# Patient Record
Sex: Male | Born: 1965 | Race: White | Hispanic: No | Smoking: Never smoker
Health system: Southern US, Community
[De-identification: ages and names within clinical notes are randomized; demographics above are authoritative.]

## PROBLEM LIST (undated history)

## (undated) ENCOUNTER — Emergency Department (HOSPITAL_COMMUNITY): Admission: EM | Payer: No Typology Code available for payment source | Source: Home / Self Care

## (undated) DIAGNOSIS — E785 Hyperlipidemia, unspecified: Secondary | ICD-10-CM

## (undated) DIAGNOSIS — N200 Calculus of kidney: Secondary | ICD-10-CM

## (undated) DIAGNOSIS — G20A1 Parkinson's disease without dyskinesia, without mention of fluctuations: Secondary | ICD-10-CM

## (undated) DIAGNOSIS — I2510 Atherosclerotic heart disease of native coronary artery without angina pectoris: Secondary | ICD-10-CM

## (undated) DIAGNOSIS — G4700 Insomnia, unspecified: Secondary | ICD-10-CM

## (undated) HISTORY — PX: TONSILLECTOMY: SUR1361

## (undated) HISTORY — DX: Parkinson's disease without dyskinesia, without mention of fluctuations: G20.A1

## (undated) HISTORY — DX: Calculus of kidney: N20.0

## (undated) HISTORY — DX: Insomnia, unspecified: G47.00

## (undated) HISTORY — DX: Hyperlipidemia, unspecified: E78.5

## (undated) HISTORY — DX: Atherosclerotic heart disease of native coronary artery without angina pectoris: I25.10

## (undated) HISTORY — PX: RHINOPLASTY: SUR1284

---

## 1998-04-21 ENCOUNTER — Ambulatory Visit (HOSPITAL_COMMUNITY): Admission: RE | Admit: 1998-04-21 | Discharge: 1998-04-21 | Payer: Self-pay | Admitting: *Deleted

## 2006-06-25 ENCOUNTER — Ambulatory Visit: Payer: Self-pay | Admitting: Internal Medicine

## 2006-06-26 ENCOUNTER — Ambulatory Visit (HOSPITAL_COMMUNITY): Admission: RE | Admit: 2006-06-26 | Discharge: 2006-06-26 | Payer: Self-pay | Admitting: Internal Medicine

## 2006-06-26 IMAGING — US US ABDOMEN COMPLETE
1 series · 13 of 25 positions shown · non-contrast
Comparison: None.

CLINICAL DATA: 40-year-old, elevated liver function studies. 
 ABDOMEN ULTRASOUND:
TECHNIQUE: Complete abdominal ultrasound examination was performed including evaluation of the liver, gallbladder, bile ducts, pancreas, kidneys, spleen, IVC, and abdominal aorta.

[Series 1: unknown · 0.34mm/px · 13 of 84 slices shown]
[im 1/84]
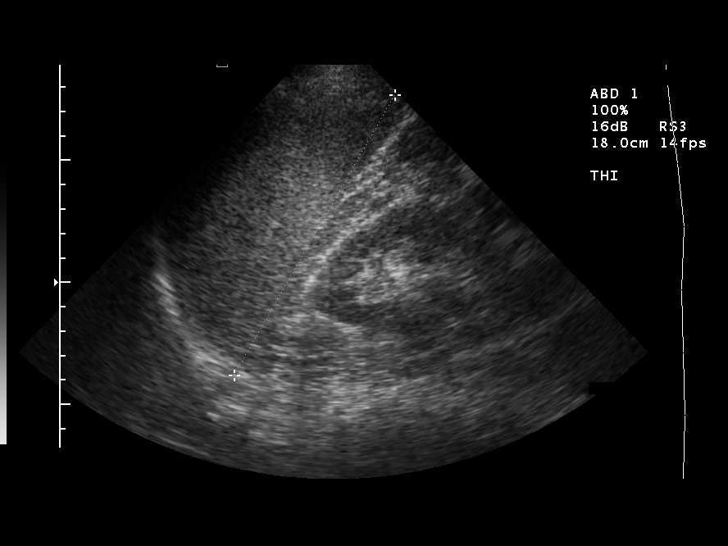
[im 7/84]
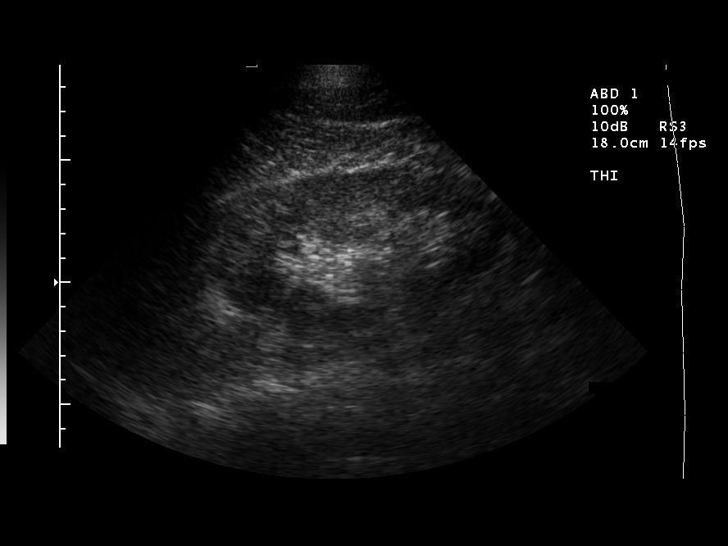
[im 14/84]
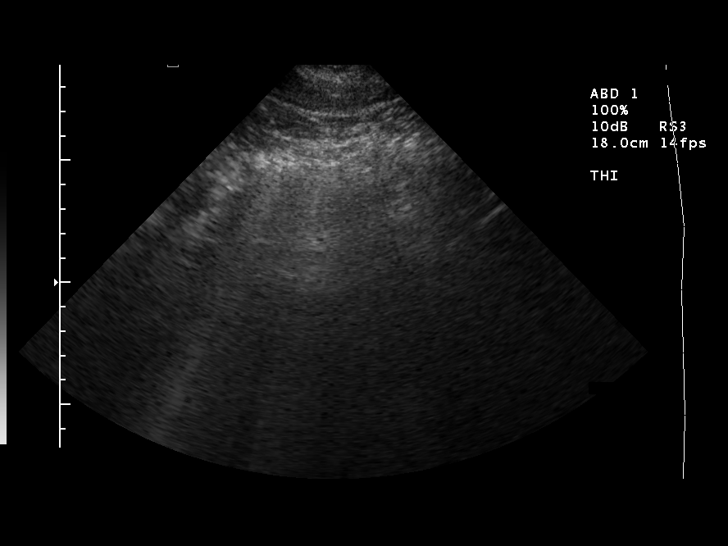
[im 21/84]
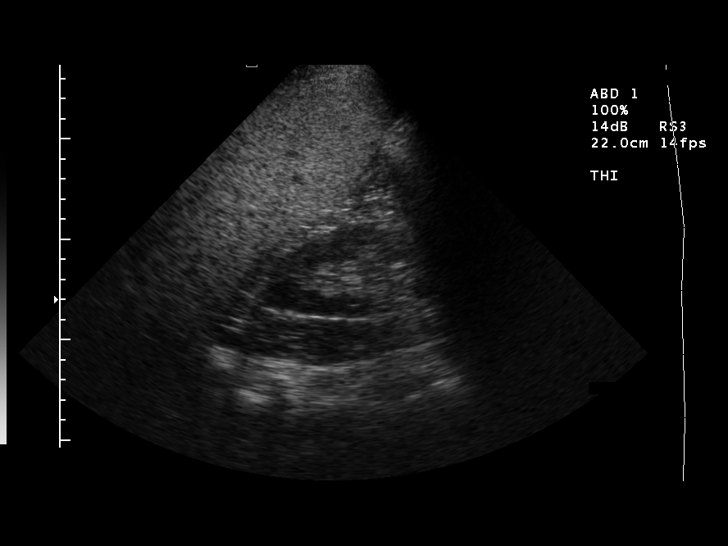
[im 28/84]
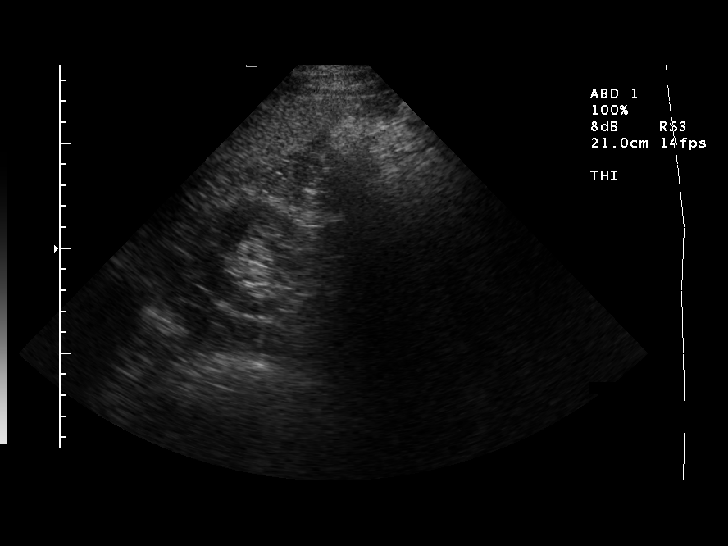
[im 35/84]
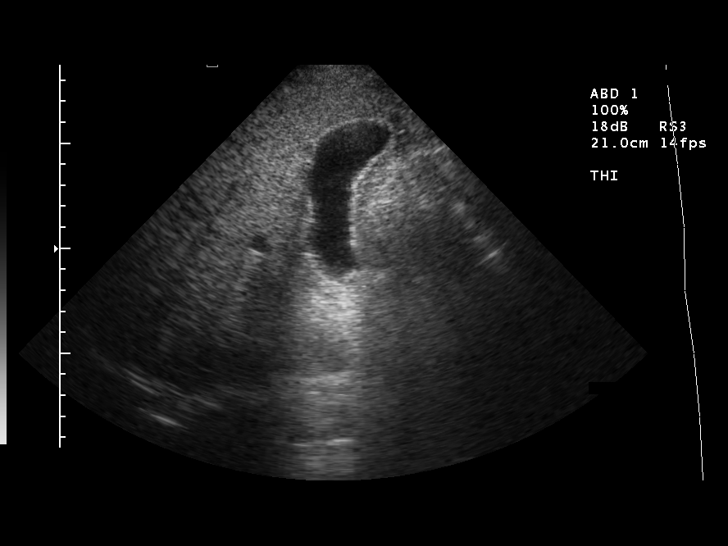
[im 42/84]
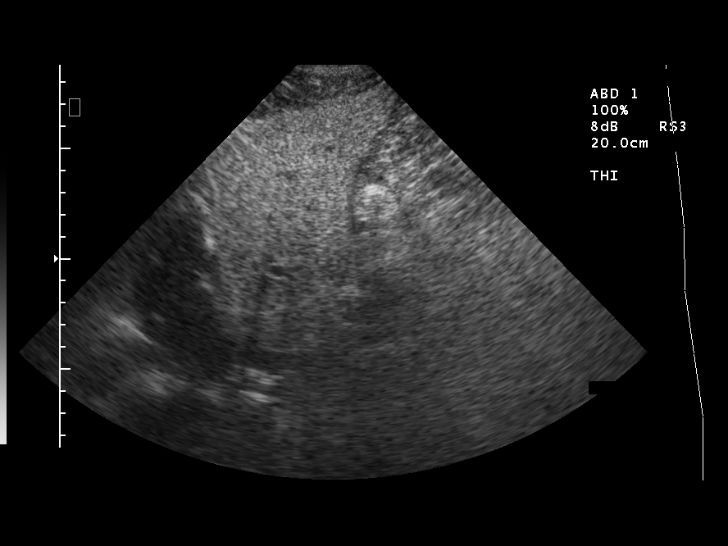
[im 49/84]
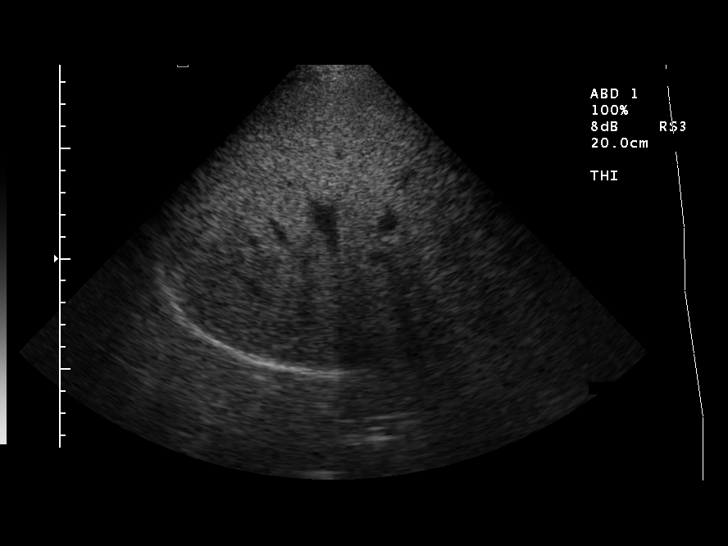
[im 56/84]
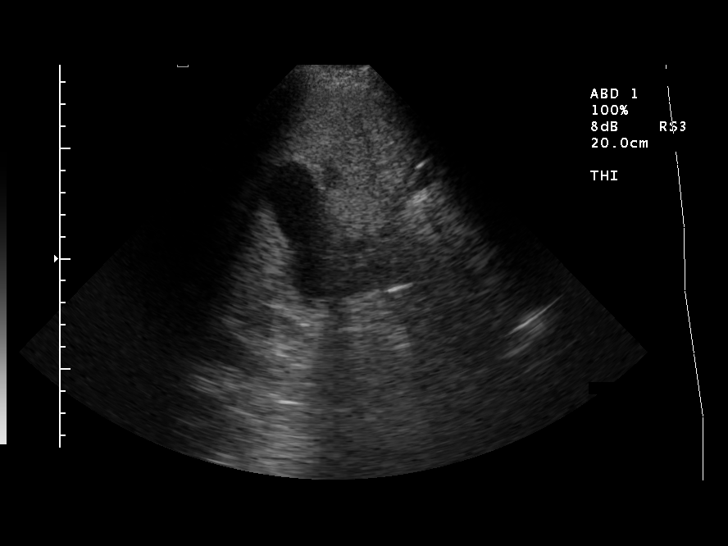
[im 63/84]
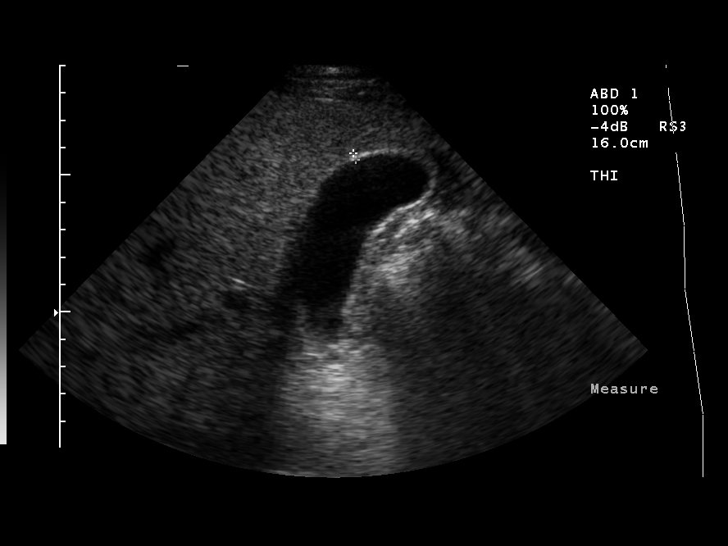
[im 70/84]
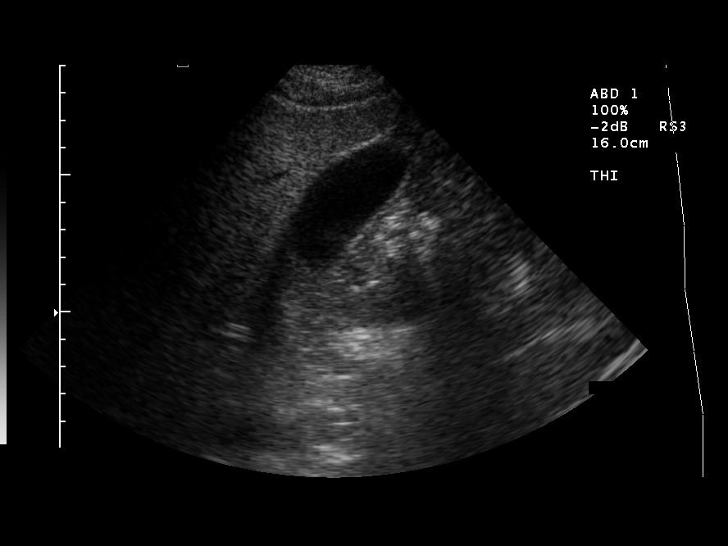
[im 77/84]
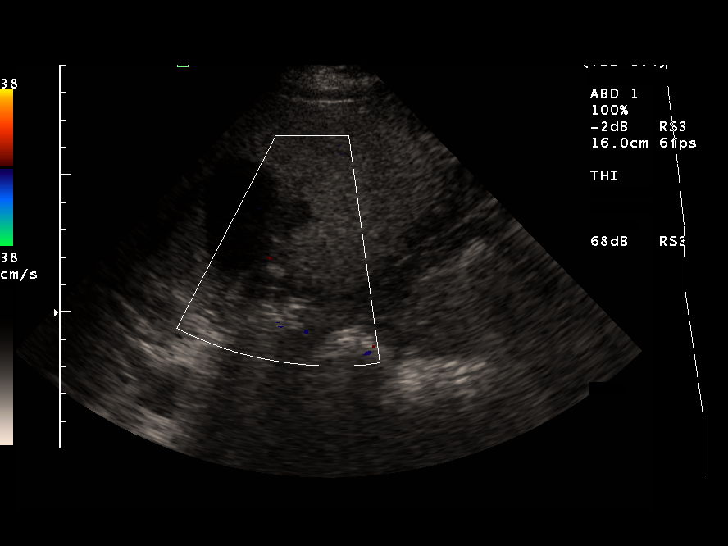
[im 84/84]
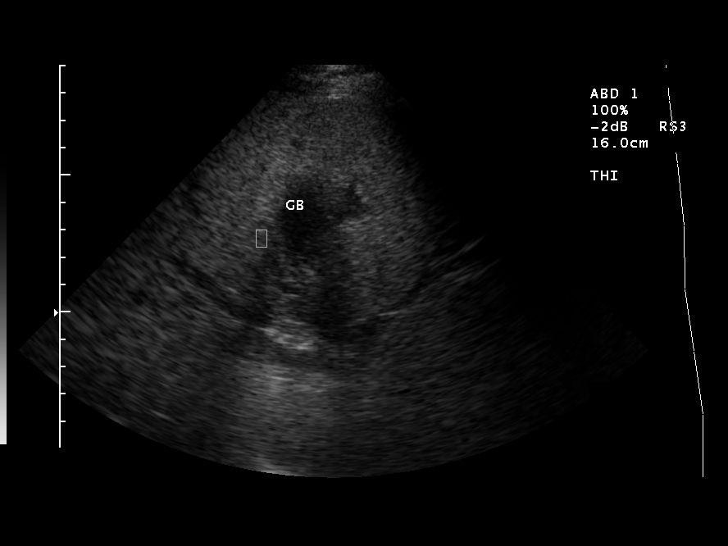

[13 of 25 positions shown; findings below may reference images not displayed]

FINDINGS: The liver demonstrates marked increased echogenicity with poor through transmission consistent with diffuse fatty infiltration.  There is an area of focal fatty sparing near the gallbladder fossa which is not uncommon.  No intrahepatic biliary dilatation.  The common bile duct measures 2.6 mm.  Gallbladder is sonographically normal.  
 IVC and aorta are normal in caliber.  Pancreas is not well visualized due to overlying bowel gas and poor sonographic window of the liver.  Spleen is within normal limits measuring a maximum of 13.2 cm.   Right kidney measures 12.9 cm.  Left kidney measures 12.1 cm.  Both kidneys demonstrate normal echogenicity without focal lesions or hydronephrosis.
IMPRESSION: 1.  Diffuse fatty infiltration of the liver.  Focal fatty sparing near the gallbladder fossa. 
 2.  Nonvisualization of the pancreas. 
 3.  Remainder of the examination is unremarkable.

## 2006-06-30 ENCOUNTER — Ambulatory Visit: Payer: Self-pay | Admitting: Internal Medicine

## 2006-06-30 ENCOUNTER — Ambulatory Visit (HOSPITAL_COMMUNITY): Admission: RE | Admit: 2006-06-30 | Discharge: 2006-06-30 | Payer: Self-pay | Admitting: Internal Medicine

## 2010-04-23 ENCOUNTER — Ambulatory Visit: Payer: Self-pay | Admitting: Cardiovascular Disease

## 2010-04-23 DIAGNOSIS — R079 Chest pain, unspecified: Secondary | ICD-10-CM | POA: Insufficient documentation

## 2010-04-23 DIAGNOSIS — R011 Cardiac murmur, unspecified: Secondary | ICD-10-CM | POA: Insufficient documentation

## 2010-04-23 DIAGNOSIS — R9431 Abnormal electrocardiogram [ECG] [EKG]: Secondary | ICD-10-CM | POA: Insufficient documentation

## 2010-04-24 ENCOUNTER — Encounter: Payer: Self-pay | Admitting: Cardiovascular Disease

## 2010-05-09 ENCOUNTER — Telehealth (INDEPENDENT_AMBULATORY_CARE_PROVIDER_SITE_OTHER): Payer: Self-pay | Admitting: *Deleted

## 2010-05-10 ENCOUNTER — Ambulatory Visit (HOSPITAL_COMMUNITY): Admission: RE | Admit: 2010-05-10 | Discharge: 2010-05-10 | Payer: Self-pay | Admitting: Cardiovascular Disease

## 2010-05-10 ENCOUNTER — Ambulatory Visit: Payer: Self-pay | Admitting: Internal Medicine

## 2010-05-10 ENCOUNTER — Encounter (HOSPITAL_COMMUNITY): Admission: RE | Admit: 2010-05-10 | Discharge: 2010-07-11 | Payer: Self-pay | Admitting: Cardiovascular Disease

## 2010-05-10 ENCOUNTER — Encounter: Payer: Self-pay | Admitting: Internal Medicine

## 2010-05-10 ENCOUNTER — Ambulatory Visit: Payer: Self-pay

## 2010-05-10 ENCOUNTER — Encounter: Payer: Self-pay | Admitting: Cardiovascular Disease

## 2010-05-11 ENCOUNTER — Telehealth (INDEPENDENT_AMBULATORY_CARE_PROVIDER_SITE_OTHER): Payer: Self-pay | Admitting: *Deleted

## 2010-12-04 NOTE — Letter (Signed)
Summary: EKG 04-05-10  EKG 04-05-10   Imported By: Faythe Ghee 04/24/2010 15:12:34  _____________________________________________________________________  External Attachment:    Type:   Image     Comment:   External Document

## 2010-12-04 NOTE — Assessment & Plan Note (Signed)
Summary: NP6 CHEST PAIN AND ABNORMAL EKG   Visit Type:  Initial Consult Primary Provider:  [facility]  CC:  no cardiology complaints.  History of Present Illness: Edward Fisher is seen today at the request of the [facility] for abnormal ECG.  CRF's include low HDL which he has recently been started on fish oil.  I reviewed his ECG from 04/05/10 which showed poor R wave progression and Q's in 3,F  He has had two episodes of SSCP one in April and one in May.  One occurred after riding go carts and one after attaching a trailer.  Lasted about 30 minutes with central pressure.  No associated diaphoresis, dyspnea, syncope or edema.  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  Does these activities with no limitations and no SSCP.  I take care of Damek's dad Reita Cliche who has been Rx for LM disease.  Current Problems (verified): 1)  Abnormal Electrocardiogram  (ICD-794.31) 2)  Chest Pain  (ICD-786.50)  Current Medications (verified): 1)  Tylenol 325 Mg Tabs (Acetaminophen) .... Take As Needed For Pain 2)  Motrin Ib 200 Mg Tabs (Ibuprofen) .... Use As Needed 3)  Fish Oil 500 Mg Caps (Omega-3 Fatty Acids) .... Take 2 Caps Daily 4)  Aspir-Low 81 Mg Tbec (Aspirin) .... Take 1 Tab Daily  Allergies (verified): No Known Drug Allergies  Past History:  Past Medical History: Last updated: 04/18/2010 hypercholesterolemia insomnia renal lithiasis  Past Surgical History: Last updated: 04/18/2010 tonsillectomy rhinoplasty  Family History: Last updated: 04/18/2010 Father:alive peptic ulcer disease Mother:alive hypertension Siblings:3 alive and well  Social History: Last updated: 04/18/2010 Full Time Married  Tobacco Use - No.  Alcohol Use - no Regular Exercise - no Drug Use - no  Review of Systems       Denies fever, malais, weight loss, blurry vision, decreased visual acuity, cough, sputum, SOB, hemoptysis, pleuritic pain,  palpitaitons, heartburn, abdominal pain, melena, lower extremity edema, claudication, or rash.   Vital Signs:  Patient profile:   45 year old male Height:      71 inches Weight:      203 pounds BMI:     28.42 Pulse rate:   91 / minute BP sitting:   125 / 74  (right arm)  Vitals Entered By: Dreama Saa, CNA (April 23, 2010 3:47 PM)  Physical Exam  General:  Affect appropriate Healthy:  appears stated age HEENT: normal Neck supple with no adenopathy JVP normal no bruits no thyromegaly Lungs clear with no wheezing and good diaphragmatic motion Heart:  S1/S2 soft systolic  murmur, no rub, gallop or click PMI normal Abdomen: benighn, BS positve, no tenderness, no AAA no bruit.  No HSM or HJR Distal pulses intact with no bruits No edema Neuro non-focal Skin warm and dry    Impression & Recommendations:  Problem # 1:  ABNORMAL ELECTROCARDIOGRAM (ICD-794.31) With SSCP x2  F/U stress myovue   His updated medication list for this problem includes:    Aspir-low 81 Mg Tbec (Aspirin) .[name]... Take 1 tab daily  Problem # 2:  CHEST PAIN (ICD-786.50) See above.  ? old IMI ? occurred in April or May  Echo and MYOUVUE R/O RWMA and ischemia His updated medication list for this problem includes:    Aspir-low 81 Mg Tbec (Aspirin) .[name]... Take 1 tab daily  Problem # 3:  CARDIAC MURMUR (ICD-785.2) Benign systolic murmur.  Echo.    Other Orders:  2-D Echocardiogram (2D Echo) Nuclear Stress Test (Nuc Stress Test)  Patient Instructions: 1)  Your physician recommends that you schedule a follow-up appointment in: as needed  2)  Your physician recommends that you continue on your current medications as directed. Please refer to the Current Medication list given to you today. 3)  Your physician has requested that you have an echocardiogram.  Echocardiography is a painless test that uses sound waves to create images of your heart. It provides your doctor with information about the size and shape  of your heart and how well your heart's chambers and valves are working.  This procedure takes approximately one hour. There are no restrictions for this procedure. 4)  Your physician has requested that you have an exercise stress myoview.  For further information please visit [email or web address]  Please follow instruction sheet, as given.   EKG Report  Procedure date:  04/23/2010  Findings:      NSR 95 Possible LAE RSR' ? IMI Compared to ECG Department Health 6/4 Improved R wave progression increased HR Axis shift with no Q in lead F

## 2010-12-04 NOTE — Assessment & Plan Note (Signed)
Summary: Cardiology Nuclear Study  Nuclear Med Background Indications for Stress Test: Evaluation for Ischemia, Abnormal EKG  Indications Comments: .   History Comments: NO DOCUMENTED CAD.  Symptoms: Chest Pain, Chest Pain with Exertion, Chest Pressure, Chest Pressure with Exertion, DOE  Symptoms Comments: Last episode of CP:03/29/10   Nuclear Pre-Procedure Cardiac Risk Factors: Lipids Caffeine/Decaff Intake: None NPO After: 10:00 PM Lungs: Clear IV 0.9% NS with Angio Cath: 20g     IV Site: (R) AC IV Started by: Irean Hong RN Chest Size (in) 44     Height (in): 71 Weight (lb): 198 BMI: 27.72  Nuclear Med Study 1 or 2 day study:  1 day     Stress Test Type:  Stress Reading MD:  Dietrich Pates, MD     Referring MD:  Charlton Haws, MD Resting Radionuclide:  Technetium 9m Tetrofosmin     Resting Radionuclide Dose:  11.0 mCi  Stress Radionuclide:  Technetium 52m Tetrofosmin     Stress Radionuclide Dose:  33.0 mCi   Stress Protocol Exercise Time (min):  8:16 min     Max HR:  173 bpm     Predicted Max HR:  174 bpm  Max Systolic BP: 187 mm Hg     Percent Max HR:  99.43 %     METS: 10.4 Rate Pressure Product:  81191    Stress Test Technologist:  [employer]a [employer] CMA-N     Nuclear Technologist:  Domenic Polite CNMT  Rest Procedure  Myocardial perfusion imaging was performed at rest 45 minutes following the intravenous administration of Myoview Technetium 81m Tetrofosmin.  Stress Procedure  The patient exercised for 8:16.  The patient stopped due to fatigue and denied any chest pain.  There were no significant ST-T wave changes.  Myoview was injected at peak exercise and myocardial perfusion imaging was performed after a brief delay.  QPS Raw Data Images:  Soft tissue (diaphragm) underlies heart. Stress Images:  There is normal uptake in all areas. Rest Images:  Normal homogeneous uptake in all areas of the myocardium. Subtraction (SDS):  No evidence of ischemia. Transient Ischemic  Dilatation:  .92  (Normal <1.22)  Lung/Heart Ratio:  .27  (Normal <0.45)  Quantitative Gated Spect Images QGS EDV:  85 ml QGS ESV:  29 ml QGS EF:  66 %   Overall Impression  Exercise Capacity: Good exercise capacity. BP Response: Normal blood pressure response. Clinical Symptoms: No chest pain ECG Impression: No significant ST segment change suggestive of ischemia. Overall Impression: Normal stress nuclear study.  Appended Document: Cardiology Nuclear Study normal nuclear

## 2010-12-04 NOTE — Letter (Signed)
Summary: [place] [facility] (Nuc Med Stress)  [facility]  [address] [place] [address]   Phone: [phone]  Fax: [phone]    Nuclear Medicine 1-Day Stress Test Information Sheet  Re:     Edward Fisher   DOB:     January 07, 1966 MRN:     [id number] Weight:  Appointment Date: Register at: Appointment Time: Referring MD:  _X__Exercise Stress  __Adenosine   __Dobutamine  __Lexiscan  __Persantine   __Thallium  Urgency: ____1 (next day)   ____2 (one week)    ____3 (PRN)  Patient will receive Follow Up call with results: Patient needs follow-up appointment:  Instructions regarding medication:  How to prepare for your stress test: 1. DO NOT eat or dring 6 hours prior to your arrival time. This includes no caffeine (coffee, tea, sodas, chocolate) if you were instructed to take your medications, drink water with it. 2. DO NOT use any tobacco products for at leaset 8 hours prior to arrival. 3. DO NOT wear dresses or any clothing that may have metal clasps or buttons. 4. Wear short sleeve shirts, loose clothing, and comfortalbe walking shoes. 5. DO NOT use lotions, oils or powder on your chest before the test. 6. The test will take approximately 3-4 hours from the time you arrive until completion. 7. To register the day of the test, go to the Short Stay entrance at [facility]. 8. If you must cancel your test, call [phone] as soon as you are aware.  After you arrive for test:   When you arrive at [facility], you will go to Short Stay to be registered. They will then send you to Radiology to check in. The Nuclear Medicine Tech will get you and start an IV in your arm or hand. A small amount of a radioactive tracer will then be injected into your IV. This tracer will then have to circulate for 30-45 minutes. During this time you will wait in the waiting room and you will be able to drink something without caffeine. A series of pictures will be taken of  your heart follwoing this waiting period. After the 1st set of pictures you will go to the stress lab to get ready for your stress test. During the stress test, another small amount of a radioactive tracer will be injected through your IV. When the stress test is complete, there is a short rest period while your heart rate and blood pressure will be monitored. When this monitoring period is complete you will have another set of pictrues taken. (The same as the 1st set of pictures). These pictures are taken between 15 minutes and 1 hour after the stress test. The time depends on the type of stress test you had. Your doctor will inform you of your test results within 7 days after test.    The possibilities of certain changes are possible during the test. They include abnormal blood pressure and disorders of the heart. Side effects of persantine or adenosine can include flushing, chest pain, shortness of breath, stomach tightness, headache and light-headedness. These side effects usually do not last long and are self-resolving. Every effort will be made to keep you comfortable and to minimize complications by obtaining a medical history and by close observation during the test. Emergency equipment, medications, and trained personnel are available to deal with any unusual situation which may arise.  Please notify office at least 48 hours in advance if you are unable to keep  this appt.

## 2010-12-04 NOTE — Progress Notes (Signed)
Summary: Nuclear Pre-Procedure  Phone Note Outgoing Call   Call placed by: Milana Na, EMT-P,  May 09, 2010 3:44 PM Summary of Call: Reviewed information on Myoview Information Sheet (see scanned document for further details).  Spoke with patient's wife.     Nuclear Med Background Indications for Stress Test: Evaluation for Ischemia, Abnormal EKG  Indications Comments: PRWP per P.Nishan    Symptoms: Chest Pain, Chest Pressure    Nuclear Pre-Procedure Cardiac Risk Factors: Lipids Height (in): 71  Nuclear Med Study Referring MD:  P.Sara Lee

## 2010-12-04 NOTE — Progress Notes (Signed)
Summary: TEST RESULTS  Phone Note Call from Patient Call back at [facility] Phone [phone] Call back at [phone] OR [phone]   Caller: PT Reason for Call: Lab or Test Results Summary of Call: PT CALIING TO GET TEST RESULTS. NUC AND ECHO DONE 05/10/10. Initial call taken by: Faythe Ghee,  May 11, 2010 2:13 PM  Follow-up for Phone Call        No answer at any of the 3 phone numbers provided by pt. Will continue to call. Follow-up by: Larita Fife Via LPN,  May 12, 2439 2:37 PM  Additional Follow-up for Phone Call Additional follow up Details #1::        results discussed in detail with pt's spouse on the telephone per his request. She verbalized understanding. Additional Follow-up by: Teressa Lower RN,  May 11, 2010 4:51 PM

## 2010-12-04 NOTE — Letter (Signed)
Summary: LABS 04-06-10  LABS 04-06-10   Imported By: Faythe Ghee 04/24/2010 15:12:13  _____________________________________________________________________  External Attachment:    Type:   Image     Comment:   External Document

## 2011-03-22 NOTE — Consult Note (Signed)
NAMEMARQUAVIS, Fisher                 ACCOUNT NO.:  [id number]   MEDICAL RECORD NO.:  [id number]           PATIENT TYPE:  AMB   LOCATION:                                FACILITY:  [facility]   PHYSICIAN:  R. Roetta Sessions, M.D. DATE OF BIRTH:  11/07/1965   DATE OF CONSULTATION:  DATE OF DISCHARGE:                                   CONSULTATION   REFERRING PHYSICIAN:  Dr.  Phillips Odor.   REASON FOR CONSULTATION:  Intermittent hematochezia and abnormal LFTs.   HISTORY OF PRESENT ILLNESS:  [name] is a 45 year old Caucasian male who  presents with two concerns today, the first being a history of intermittent  hematochezia.  He generally has had problems with this over the last 10-15  years.  He tells me that he has had at least 5 occasions where he has  noticed small to moderate amounts of rectal bleeding with wiping on the  toilet tissue.  He describes it as bright red.  He went to see Dr. Phillips Odor  for physical exam, and he noticed blood on digital rectal exam.  He  complains of occasional straining.  He feels he may have some small  hemorrhoids but has never been treated for this.  He denies any proctalgia  or pruritus.  He denies any abdominal pain.  His bowel movements have been  soft and brown.  Occasionally he has hard stools in small amount __________  , but this is rare.   He also is here for evaluation of elevated AST at 41 and ALT at 77 found on  routine labs. He tells me he felt he had some abnormalities of his LFTs back  around 10 years ago.  He has a tattoo to his right forearm from the 1980s.  He denies any history of transfusion, IV or intranasal drug use.  Denies any  heavy alcohol use.  Denies any history of hepatitis, jaundice. Denies any  problems with darkening of his urine or clay-colored stools, fevers, chills,  or occupational exposure.  He denies history of multiple sexual partners.  He was also recently diagnosed with hypercholesterolemia,  hypertriglyceridemia.   PAST MEDICAL HISTORY:  1. Diagnosed last week with hypercholesterolemia.  2. Insomnia.  3. Renal lithiasis.  4. Tonsillectomy.  5. Status post rhinoplasty.   CURRENT MEDICATIONS:  1. Ambien 10 mg nightly.  2. Tylenol 1-2 p.r.n.  3. Motrin 200 mg p.r.n.   ALLERGIES:  No known drug allergies.   FAMILY HISTORY:  No known family history of colorectal carcinoma, liver or  chronic GI problems except for father with peptic ulcer disease.  Mother,  age 70, has history of hypertension.  His father also has coronary artery  disease.  He has 3 healthy siblings.   SOCIAL HISTORY:  [name] has been married for 20 years.  He has 3  healthy children.  He Counsellor.  He denies tobacco,  alcohol, or drug use.   REVIEW OF SYSTEMS:  CONSTITUTIONAL: Denies any fatigue.  See HPI.  CARDIOVASCULAR: Denies chest pain or palpitations. PULMONARY: Denies  shortness of breath, dyspnea, cough, hemoptysis.  GI:  See HPI.  Denies  dysphagia or odynophagia, heartburn, indigestion, anorexia, or early  satiety.   PHYSICAL EXAMINATION:  VITAL SIGNS:  Weight 203.5 pounds, height 70-1/2  inches to 93.  Blood pressure 140/80, pulse 70.  GENERAL:  [name] is a 45 year old obese Caucasian male is alert,  oriented, pleasant, cooperative, no acute distress.  HEENT:  Sclerae clear, not injections.  Conjunctivae pink.  Oropharynx pink  and moist without lesions.  NECK:  Supple without thyromegaly.  HEART:  Regular rate and rhythm.  Normal S1 and S2.  Without clicks or  gallops.  LUNGS: Clear to auscultation bilaterally.  ABDOMEN: Protuberant with positive bowel sounds x4.  No bruits auscultated.  Abdomen is soft, nontender, nondistended, without hepatosplenomegaly or  guarding.  Exam is limited given patient's body habitus.  EXTREMITIES:  Without clubbing or edema bilaterally.   LABORATORY DATA:  Studies from June 18, 2006, show white blood cell count  6.4, hemoglobin 16, hematocrit 49.3,  platelets 205.  He had a comprehensive  metabolic panel which was normal except for elevated AST and ALT as  described above.  He had a total bilirubin of 0.5, alkaline phosphatase 78,  total protein 7.7, albumin 4.9.  He has a total cholesterol of 202,  triglycerides 172, HDL 32, and LDL 136.  He had a normal PSA.   IMPRESSION:  [name] is a 45 year old Caucasian male with two  concerns  today.  The first is intermittent small-volume hematochezia, usually post  defecation with wiping noted on toilet paper and was noticed during physical  exam recently.  He is going to need colonoscopy to further determine the  source of his rectal bleeding, to rule out polyps or colorectal carcinoma.   Secondly, he was recently found to have an elevated AST at 41, ALT 77.  Given his history of obesity and hypertriglyceridemia, suspect we are  dealing with a fatty liver.  He does have a tattoo, and we should rule out  hepatitis.  We will also obtain an abdominal ultrasound as well as check  iron studies to rule out hemochromatosis.   PLAN:  1. Abdominal ultrasound.  2. Hepatitis A antibody, hepatitis B surface antigen, hepatitis C      antibody, iron, ferritin, TIBC.  3. Colonoscopy by Dr. Jena Gauss in the near future.  I discussed procedure      including risks and benefits to include but      not limited to bleeding, infection, perforation.  He agrees and consent      obtained.  4. If above hepatic workup is negative, will recheck LFTs in a month and      then 6 months to ensure stability.   We would like to thank Dr. Phillips Odor for allowing [place] to participate int eh  care of [name].      Nicholas Lose, N.P.      Jonathon Bellows, M.D.  Electronically Signed    KC/MEDQ  D:  06/25/2006  T:  06/25/2006  Job:  [id number]

## 2011-03-22 NOTE — Op Note (Signed)
Edward Fisher, Edward Fisher                 ACCOUNT NO.:  [id number]   MEDICAL RECORD NO.:  [id number]          PATIENT TYPE:  AMB   LOCATION:  DAY                           FACILITY:  [facility]   PHYSICIAN:  R. Roetta Sessions, M.D. DATE OF BIRTH:  August 05, 1966   DATE OF PROCEDURE:  07/01/2006  DATE OF DISCHARGE:                                 OPERATIVE REPORT   PROCEDURE:  Diagnostic colonoscopy.   INDICATIONS FOR PROCEDURE:  Intermittent small volume hematochezia,  essentially paper hematochezia after having a bowel movement, mildly  elevated transaminases, fatty appearing liver on ultrasound.  Colonoscopy  now being done.  This approach has been discussed with the patient at  length.  Potential risks, benefits and alternatives have been reviewed, and  questions answered.  He is agreeable.  Please see the documentation in the  medical record.   PROCEDURE NOTE:  O2 saturation, blood pressure, pulse and respirations were  monitored throughout the entire procedure.   CONSCIOUS SEDATION:  Versed 5mg  IV, Demerol 100 mg IV in divided doses.   INSTRUMENT:  Olympus video chip system.   FINDINGS:  Digital rectal examination revealed no abnormalities.   ENDOSCOPIC FINDINGS:  Prep was good.   EN PASSE VIEW IN RECTAL CANAL:  Examination of the rectal mucosa, including  retroflexed view of the anal verge, revealed some anal canal  hemorrhoids/friable anal canal tissue.  The remainder of the rectal mucosa  appeared normal.   COLON:  Colonic mucosa was surveyed from the rectosigmoid junction through  the left, transverse and right colon to the area of the appendiceal orifice,  ileocecal valve and cecum.  These structures were well seen and photographed  for the record.  From this level the scope was slowly withdrawn and all  previously mentioned mucosal surfaces were again seen.  The patient was  noted to have scattered sigmoid diverticula.  The remainder of the colonic  mucosa appeared normal.  The  patient tolerated the procedure well and was  reactive to endoscopy.   IMPRESSION:  1. Friable anal canal/anal canal hemorrhoids, otherwise normal rectum.  2. Left sided diverticula, remainder of colonic mucosa appeared normal,      suspect patient has bled from the anorectum secondary to benign      process.   RECOMMENDATIONS:  1. Hemorrhoid and diverticulitis literature provided to Mr. Suttles.  2. A 10-day course of Anusol HC suppositories, one per rectum at bedtime.  3. He should take a daily fiber supplement.  4. He should return to have his liver enzymes repeated in one month.      Edward Fisher, M.D.  Electronically Signed     RMR/MEDQ  D:  06/30/2006  T:  07/01/2006  Job:  [id number]   cc:   Corrie Mckusick, M.D.  Fax: [phone]

## 2011-05-14 ENCOUNTER — Encounter: Payer: Self-pay | Admitting: Cardiovascular Disease

## 2012-06-21 ENCOUNTER — Emergency Department (HOSPITAL_COMMUNITY)
Admission: EM | Admit: 2012-06-21 | Discharge: 2012-06-21 | Disposition: A | Discharge status: Home / Self Care | Payer: Self-pay | Attending: Emergency Medicine | Admitting: Emergency Medicine

## 2012-06-21 ENCOUNTER — Encounter (HOSPITAL_COMMUNITY): Payer: Self-pay | Admitting: *Deleted

## 2012-06-21 ENCOUNTER — Other Ambulatory Visit: Payer: Self-pay

## 2012-06-21 ENCOUNTER — Emergency Department (HOSPITAL_COMMUNITY): Payer: Self-pay

## 2012-06-21 DIAGNOSIS — I209 Angina pectoris, unspecified: Secondary | ICD-10-CM | POA: Insufficient documentation

## 2012-06-21 DIAGNOSIS — Z8249 Family history of ischemic heart disease and other diseases of the circulatory system: Secondary | ICD-10-CM | POA: Insufficient documentation

## 2012-06-21 DIAGNOSIS — R9431 Abnormal electrocardiogram [ECG] [EKG]: Secondary | ICD-10-CM | POA: Insufficient documentation

## 2012-06-21 DIAGNOSIS — E7800 Pure hypercholesterolemia, unspecified: Secondary | ICD-10-CM | POA: Insufficient documentation

## 2012-06-21 DIAGNOSIS — R079 Chest pain, unspecified: Secondary | ICD-10-CM

## 2012-06-21 DIAGNOSIS — Z87442 Personal history of urinary calculi: Secondary | ICD-10-CM | POA: Insufficient documentation

## 2012-06-21 DIAGNOSIS — G4700 Insomnia, unspecified: Secondary | ICD-10-CM | POA: Insufficient documentation

## 2012-06-21 LAB — URINALYSIS, ROUTINE W REFLEX MICROSCOPIC
Bilirubin Urine: NEGATIVE
Ketones, ur: NEGATIVE mg/dL
Nitrite: NEGATIVE
Urobilinogen, UA: 1 mg/dL (ref 0.0–1.0)

## 2012-06-21 LAB — BASIC METABOLIC PANEL
BUN: 15 mg/dL (ref 6–23)
Calcium: 9.7 mg/dL (ref 8.4–10.5)
Creatinine, Ser: 1.12 mg/dL (ref 0.50–1.35)
GFR calc Af Amer: 88 mL/min — ABNORMAL LOW (ref >90)
GFR calc non Af Amer: 76 mL/min — ABNORMAL LOW (ref >90)

## 2012-06-21 LAB — CBC WITH DIFFERENTIAL/PLATELET
Basophils Relative: 1 % (ref 0–1)
Eosinophils Absolute: 0.3 10*3/uL (ref 0.0–0.7)
Eosinophils Relative: 3 % (ref 0–5)
HCT: 44.8 % (ref 39.0–52.0)
Hemoglobin: 15.2 g/dL (ref 13.0–17.0)
MCH: 29.9 pg (ref 26.0–34.0)
MCHC: 33.9 g/dL (ref 30.0–36.0)
Monocytes Absolute: 0.8 10*3/uL (ref 0.1–1.0)
Monocytes Relative: 10 % (ref 3–12)
Neutrophils Relative %: 53 % (ref 43–77)

## 2012-06-21 IMAGING — CR DG CHEST 1V PORT
1 series · 1 of 1 positions shown · non-contrast
Comparison: None.

CLINICAL DATA: Chest pain.

PORTABLE CHEST - 1 VIEW

[view not recorded]
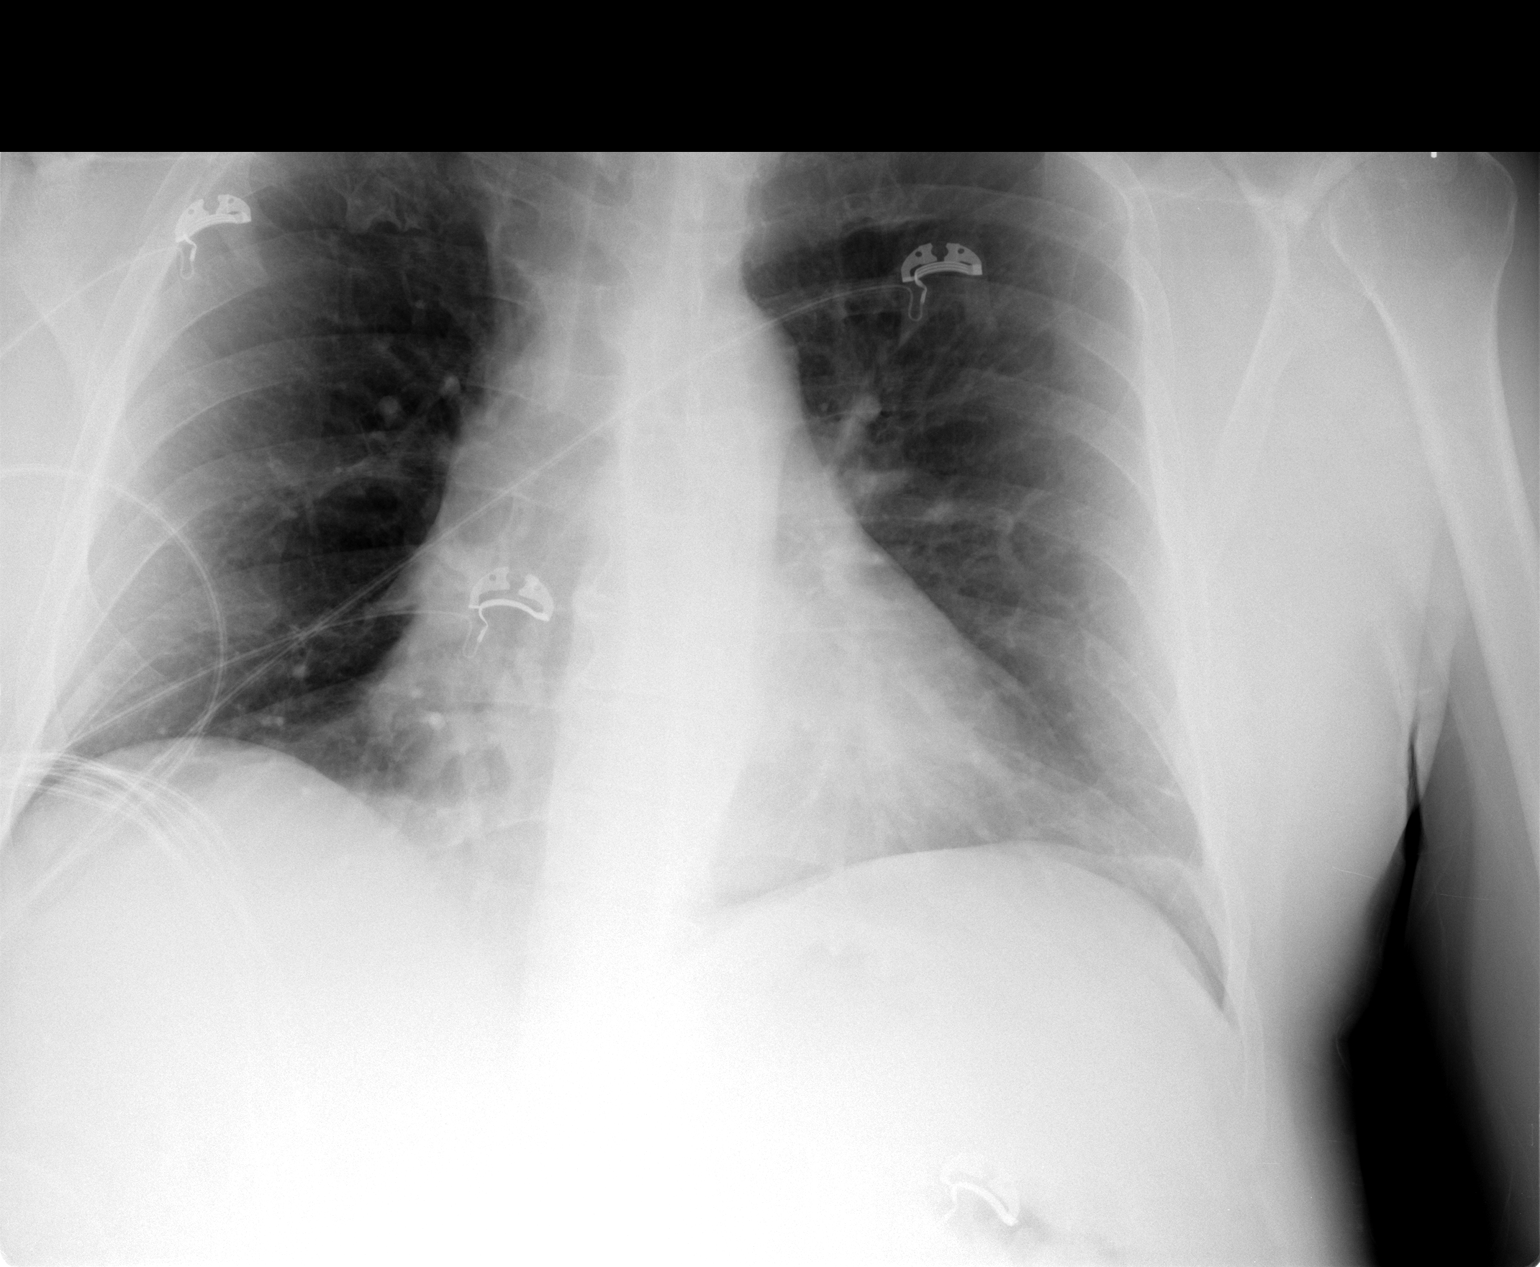

[1 of 1 positions shown; findings below may reference images not displayed]

FINDINGS: Trachea is midline.  Heart size is accentuated by low
lung volumes and AP technique.  There may be atelectasis or
scarring along the left hemidiaphragm.  Lungs are otherwise clear.
No pleural fluid.
IMPRESSION: No acute findings.

## 2012-06-21 MED ORDER — METOPROLOL TARTRATE 50 MG PO TABS
25.0000 mg | ORAL_TABLET | Freq: Two times a day (BID) | ORAL | Status: DC
Start: 2012-06-21 — End: 2012-06-22

## 2012-06-21 MED ORDER — NITROGLYCERIN 0.4 MG SL SUBL: 0.4000 mg | SUBLINGUAL_TABLET | SUBLINGUAL | Status: DC | PRN

## 2012-06-21 MED ORDER — METOPROLOL TARTRATE 50 MG PO TABS
25.0000 mg | ORAL_TABLET | Freq: Once | ORAL | Status: AC
  Administered 2012-06-21: 25 mg via ORAL
  Filled 2012-06-21: qty 1

## 2012-06-21 NOTE — ED Notes (Signed)
Pt c/o centralized chest pain off and on x 3 weeks. Pt describes it as burning and aching. C/o having a little shortness of breath. Pt denies diaphoresis, nausea or vomiting.

## 2012-06-21 NOTE — ED Provider Notes (Addendum)
History     CSN: [id number]  Arrival date & time 06/21/12  1714   First MD Initiated Contact with Patient 06/21/12 1735      Chief Complaint  Patient presents with  . Chest Pain    (Consider location/radiation/quality/duration/timing/severity/associated sxs/prior treatment) HPI Comments: The patient is a 46 year old man who complains having intermittent chest pain for 3 weeks. If your heart talk or walk to the store he'll get the pain. He rates for 6 or 7. It'll last several minutes, and Achilles breasts, and at that point the pain goes away. He also feels some mild shortness of breath when the pain is gone. He has had prior workup for chest pain about 3 years ago, all negative. He's not currently experiencing pain. Where he has had recurring bouts of pain, and has a family history positive for coronary artery disease he seeks evaluation.  Patient is a 46 y.o. male presenting with chest pain. The history is provided by the patient, medical records and the spouse. No language interpreter was used.  Chest Pain Episode onset: Intermittent chest pain for 3 weeks. Duration of episode(s) is 5 minutes. Chest pain occurs intermittently. The chest pain is unchanged. Associated with: Comes after walking short distances. At its most intense, the pain is at 7/10. The pain is currently at 0/10. The severity of the pain is moderate. The quality of the pain is described as burning. The pain does not radiate. Chest pain is worsened by exertion. Primary symptoms include shortness of breath. Pertinent negatives for primary symptoms include no fever. He tried nothing for the symptoms. Risk factors include lack of exercise, male gender and obesity.  His past medical history is significant for hyperlipidemia.  His family medical history is significant for CAD in family and heart disease in family.  Procedure history is positive for echocardiogram and exercise treadmill test.     Past Medical History  Diagnosis  Date  . Hypercholesterolemia   . Insomnia   . Renal lithiasis     Past Surgical History  Procedure Date  . Tonsillectomy   . Rhinoplasty     Family History  Problem Relation Age of Onset  . Other Father     Peptic ulcer disease  . Hypertension Mother     History  Substance Use Topics  . Smoking status: Never Smoker   . Smokeless tobacco: Not on file  . Alcohol Use: No      Review of Systems  Constitutional: Negative.  Negative for fever and chills.  HENT: Negative.   Eyes: Negative.   Respiratory: Positive for shortness of breath.   Cardiovascular: Positive for chest pain. Negative for leg swelling.  Gastrointestinal: Negative.   Genitourinary: Negative.   Musculoskeletal: Negative.   Skin: Negative.   Neurological: Negative.   Psychiatric/Behavioral: Negative.     Allergies  Review of patient's allergies indicates no known allergies.  Home Medications   Current Outpatient Rx  Name Route Sig Dispense Refill  . ACETAMINOPHEN 325 MG PO TABS Oral Take 650 mg by mouth every 6 (six) hours as needed.      . ASPIRIN 81 MG PO TABS Oral Take 81 mg by mouth daily.      . IBUPROFEN 200 MG PO TABS Oral Take 200 mg by mouth every 6 (six) hours as needed.      [name] FISH OIL 500 MG PO CAPS Oral Take 2 capsules by mouth daily.        BP 143/90  Pulse 92  Temp 98.3 F (36.8 C) (Oral)  Resp 20  Ht 5' 10.5" (1.791 m)  Wt 205 lb (92.987 kg)  BMI 29.00 kg/m2  SpO2 100%  Physical Exam  Nursing note and vitals reviewed. Constitutional: He is oriented to person, place, and time. He appears well-developed and well-nourished. No distress.  HENT:  Head: Normocephalic and atraumatic.  Right Ear: External ear normal.  Left Ear: External ear normal.  Mouth/Throat: Oropharynx is clear and moist.  Eyes: Conjunctivae and EOM are normal. Pupils are equal, round, and reactive to light.  Neck: Normal range of motion. Neck supple.  Cardiovascular: Normal rate, regular rhythm and  normal heart sounds.   Pulmonary/Chest: Effort normal and breath sounds normal. He exhibits no tenderness.  Abdominal: Soft. Bowel sounds are normal.  Musculoskeletal: Normal range of motion. He exhibits no edema and no tenderness.  Neurological: He is alert and oriented to person, place, and time.       No sensory or motor deficit.  Skin: Skin is warm and dry.  Psychiatric: He has a normal mood and affect. His behavior is normal.    ED Course  Procedures (including critical care time)  5:35 PM  Date: 06/21/2012  Rate:94  Rhythm: normal sinus rhythm  QRS Axis: normal  Intervals: normal QRS:  Q waves in III, aVF suggest possible old inferior myocardial infarction.  ST/T Wave abnormalities: normal  Conduction Disutrbances:none  Narrative Interpretation: Abnormal EKG  Old EKG Reviewed: none available  6:09 PM Patient was seen and had physical examination. EKG was benign. Laboratory tests were ordered. Old charts were reviewed.  9:26 PM Results for orders placed during the hospital encounter of 06/21/12  TROPONIN I      Component Value Range   Troponin I <0.30  <0.30 ng/mL  CBC WITH DIFFERENTIAL      Component Value Range   WBC 8.6  4.0 - 10.5 K/uL   RBC 5.08  4.22 - 5.81 MIL/uL   Hemoglobin 15.2  13.0 - 17.0 g/dL   HCT 16.1  09.6 - 04.5 %   MCV 88.2  78.0 - 100.0 fL   MCH 29.9  26.0 - 34.0 pg   MCHC 33.9  30.0 - 36.0 g/dL   RDW 40.9  81.1 - 91.4 %   Platelets 179  150 - 400 K/uL   Neutrophils Relative 53  43 - 77 %   Neutro Abs 4.5  1.7 - 7.7 K/uL   Lymphocytes Relative 33  12 - 46 %   Lymphs Abs 2.8  0.7 - 4.0 K/uL   Monocytes Relative 10  3 - 12 %   Monocytes Absolute 0.8  0.1 - 1.0 K/uL   Eosinophils Relative 3  0 - 5 %   Eosinophils Absolute 0.3  0.0 - 0.7 K/uL   Basophils Relative 1  0 - 1 %   Basophils Absolute 0.1  0.0 - 0.1 K/uL  BASIC METABOLIC PANEL      Component Value Range   Sodium 139  135 - 145 mEq/L   Potassium 4.5  3.5 - 5.1 mEq/L   Chloride 106   96 - 112 mEq/L   CO2 25  19 - 32 mEq/L   Glucose, Bld 100 (*) 70 - 99 mg/dL   BUN 15  6 - 23 mg/dL   Creatinine, Ser 7.82  0.50 - 1.35 mg/dL   Calcium 9.7  8.4 - 95.6 mg/dL   GFR calc non Af Amer 76 (*) >90 mL/min   GFR calc  Af Amer 88 (*) >90 mL/min  URINALYSIS, ROUTINE W REFLEX MICROSCOPIC      Component Value Range   Color, Urine YELLOW  YELLOW   APPearance CLEAR  CLEAR   Specific Gravity, Urine 1.015  1.005 - 1.030   pH 7.0  5.0 - 8.0   Glucose, UA NEGATIVE  NEGATIVE mg/dL   Hgb urine dipstick NEGATIVE  NEGATIVE   Bilirubin Urine NEGATIVE  NEGATIVE   Ketones, ur NEGATIVE  NEGATIVE mg/dL   Protein, ur NEGATIVE  NEGATIVE mg/dL   Urobilinogen, UA 1.0  0.0 - 1.0 mg/dL   Nitrite NEGATIVE  NEGATIVE   Leukocytes, UA NEGATIVE  NEGATIVE   Dg Chest Portable 1 View  06/21/2012  *RADIOLOGY REPORT*  Clinical Data: Chest pain.  PORTABLE CHEST - 1 VIEW  Comparison: None.  Findings: Trachea is midline.  Heart size is accentuated by low lung volumes and AP technique.  There may be atelectasis or scarring along the left hemidiaphragm.  Lungs are otherwise clear. No pleural fluid.  IMPRESSION: No acute findings.  Original Report Authenticated By: Reyes Ivan, M.D.    Lab workup was negative for MI.  Will call [facility] to discuss his situation.  9:46 PM Case discussed with Dr. Donnie Aho, covering for [facility], and we reviewed his old records and current EKG and lab findings.  Based on this, Dr. Donnie Aho advised treating for angina with aspirin 81 mg once a day, metoprolol 25 mg bid, and SL NTG prn.  He should call [facility] in [place] in the AM to be seen tomorrow.  1. Chest pain   2. Angina pectoris     I personally performed the services described in this documentation, which was scribed in my presence. The recorded information has been reviewed and considered.  Osvaldo Human, M.D.      Carleene Cooper III, MD 06/22/12 1191  Carleene Cooper III,  MD 06/23/12 1154

## 2012-06-22 ENCOUNTER — Ambulatory Visit (INDEPENDENT_AMBULATORY_CARE_PROVIDER_SITE_OTHER): Payer: Self-pay | Admitting: Cardiology

## 2012-06-22 ENCOUNTER — Inpatient Hospital Stay (HOSPITAL_COMMUNITY)
Admission: AD | Admit: 2012-06-22 | Discharge: 2012-06-29 | DRG: 234 | Disposition: A | Discharge status: Home / Self Care | Payer: MEDICAID | Source: Ambulatory Visit | Attending: Cardiothoracic Surgery | Admitting: Cardiothoracic Surgery

## 2012-06-22 ENCOUNTER — Encounter (HOSPITAL_COMMUNITY): Payer: Self-pay | Admitting: General Practice

## 2012-06-22 ENCOUNTER — Encounter: Payer: Self-pay | Admitting: Cardiology

## 2012-06-22 VITALS — BP 128/83 | HR 70 | Ht 70.0 in | Wt 206.0 lb

## 2012-06-22 DIAGNOSIS — R079 Chest pain, unspecified: Secondary | ICD-10-CM

## 2012-06-22 DIAGNOSIS — E8779 Other fluid overload: Secondary | ICD-10-CM | POA: Diagnosis not present

## 2012-06-22 DIAGNOSIS — I2510 Atherosclerotic heart disease of native coronary artery without angina pectoris: Principal | ICD-10-CM

## 2012-06-22 DIAGNOSIS — E785 Hyperlipidemia, unspecified: Secondary | ICD-10-CM | POA: Diagnosis present

## 2012-06-22 DIAGNOSIS — D62 Acute posthemorrhagic anemia: Secondary | ICD-10-CM | POA: Diagnosis not present

## 2012-06-22 DIAGNOSIS — Z79899 Other long term (current) drug therapy: Secondary | ICD-10-CM

## 2012-06-22 DIAGNOSIS — I208 Other forms of angina pectoris: Secondary | ICD-10-CM

## 2012-06-22 DIAGNOSIS — D696 Thrombocytopenia, unspecified: Secondary | ICD-10-CM | POA: Diagnosis not present

## 2012-06-22 DIAGNOSIS — I200 Unstable angina: Secondary | ICD-10-CM

## 2012-06-22 DIAGNOSIS — I209 Angina pectoris, unspecified: Secondary | ICD-10-CM

## 2012-06-22 DIAGNOSIS — Z951 Presence of aortocoronary bypass graft: Secondary | ICD-10-CM

## 2012-06-22 LAB — LIPID PANEL
Cholesterol: 207 mg/dL — ABNORMAL HIGH (ref 0–200)
HDL: 30 mg/dL — ABNORMAL LOW (ref >39)
LDL Cholesterol: 140 mg/dL — ABNORMAL HIGH (ref 0–99)
Triglycerides: 185 mg/dL — ABNORMAL HIGH (ref <150)
VLDL: 37 mg/dL (ref 0–40)

## 2012-06-22 LAB — CBC
Platelets: 189 10*3/uL (ref 150–400)
RDW: 12.5 % (ref 11.5–15.5)
WBC: 8.7 10*3/uL (ref 4.0–10.5)

## 2012-06-22 LAB — PROTIME-INR
INR: 1 (ref 0.00–1.49)
Prothrombin Time: 13.4 seconds (ref 11.6–15.2)

## 2012-06-22 LAB — CREATININE, SERUM
Creatinine, Ser: 1.03 mg/dL (ref 0.50–1.35)
GFR calc Af Amer: >90 mL/min (ref >90)
GFR calc non Af Amer: 84 mL/min — ABNORMAL LOW (ref >90)

## 2012-06-22 MED ORDER — HEPARIN SODIUM (PORCINE) 5000 UNIT/ML IJ SOLN
5000.0000 [IU] | Freq: Three times a day (TID) | INTRAMUSCULAR | Status: DC
  Administered 2012-06-22 – 2012-06-23 (×2): 5000 [IU] via SUBCUTANEOUS
  Filled 2012-06-22 (×5): qty 1

## 2012-06-22 MED ORDER — SODIUM CHLORIDE 0.9 % IJ SOLN: 3.0000 mL | INTRAMUSCULAR | Status: DC | PRN

## 2012-06-22 MED ORDER — SODIUM CHLORIDE 0.9 % IV SOLN: 250.0000 mL | INTRAVENOUS | Status: DC | PRN

## 2012-06-22 MED ORDER — ONDANSETRON HCL 4 MG/2ML IJ SOLN: 4.0000 mg | Freq: Four times a day (QID) | INTRAMUSCULAR | Status: DC | PRN

## 2012-06-22 MED ORDER — ALPRAZOLAM 0.25 MG PO TABS
0.2500 mg | ORAL_TABLET | Freq: Two times a day (BID) | ORAL | Status: DC | PRN
  Administered 2012-06-23 – 2012-06-24 (×2): 0.25 mg via ORAL
  Filled 2012-06-22 (×2): qty 1

## 2012-06-22 MED ORDER — ATORVASTATIN CALCIUM 80 MG PO TABS
80.0000 mg | ORAL_TABLET | ORAL | Status: DC
  Administered 2012-06-22: 80 mg via ORAL
  Filled 2012-06-22: qty 1

## 2012-06-22 MED ORDER — METOPROLOL TARTRATE 25 MG PO TABS
25.0000 mg | ORAL_TABLET | Freq: Two times a day (BID) | ORAL | Status: DC
  Administered 2012-06-22 – 2012-06-23 (×2): 25 mg via ORAL
  Filled 2012-06-22 (×5): qty 1

## 2012-06-22 MED ORDER — ATORVASTATIN CALCIUM 80 MG PO TABS
80.0000 mg | ORAL_TABLET | Freq: Every day | ORAL | Status: DC
  Administered 2012-06-23 – 2012-06-27 (×4): 80 mg via ORAL
  Filled 2012-06-22 (×8): qty 1

## 2012-06-22 MED ORDER — SODIUM CHLORIDE 0.9 % IJ SOLN: 3.0000 mL | Freq: Two times a day (BID) | INTRAMUSCULAR | Status: DC

## 2012-06-22 MED ORDER — NITROGLYCERIN 0.4 MG/SPRAY TL SOLN
1.0000 | Status: DC | PRN
  Filled 2012-06-22: qty 4.9

## 2012-06-22 MED ORDER — SODIUM CHLORIDE 0.9 % IV SOLN
INTRAVENOUS | Status: DC
  Administered 2012-06-23: 06:00:00 via INTRAVENOUS

## 2012-06-22 MED ORDER — ASPIRIN EC 325 MG PO TBEC
325.0000 mg | DELAYED_RELEASE_TABLET | Freq: Every day | ORAL | Status: DC
  Administered 2012-06-22: 325 mg via ORAL
  Filled 2012-06-22 (×2): qty 1

## 2012-06-22 MED ORDER — ZOLPIDEM TARTRATE 5 MG PO TABS
5.0000 mg | ORAL_TABLET | Freq: Every evening | ORAL | Status: DC | PRN
  Administered 2012-06-23: 22:00:00 5 mg via ORAL
  Filled 2012-06-22: qty 1

## 2012-06-22 MED ORDER — SODIUM CHLORIDE 0.9 % IJ SOLN
3.0000 mL | Freq: Two times a day (BID) | INTRAMUSCULAR | Status: DC
  Administered 2012-06-22: 23:00:00 via INTRAVENOUS

## 2012-06-22 MED ORDER — ACETAMINOPHEN 325 MG PO TABS: 650.0000 mg | ORAL_TABLET | ORAL | Status: DC | PRN

## 2012-06-22 MED ORDER — ATORVASTATIN CALCIUM 80 MG PO TABS: 80.0000 mg | ORAL_TABLET | Freq: Every day | ORAL | Status: DC

## 2012-06-22 NOTE — Patient Instructions (Addendum)
Your physician recommends that you schedule a follow-up appointment in: [facility]  Present to Admissions at [facility] for Admission to [facility]

## 2012-06-22 NOTE — Progress Notes (Deleted)
Name: Edward Fisher    DOB: 1966/01/06  Age: 46 y.o.  MR#: [id number]       PCP:  No primary provider on file.      Insurance: @PAYORNAME @   CC:    Chief Complaint  Patient presents with  . Chest Pain    ED 8/18 for chest pain, states has been going on for quite some time.  Has CP and DOE wiht any type of exertion.  Med list reviewed/TC    VS BP 128/83  Pulse 70  Ht 5\' 10"  (1.778 m)  Wt 206 lb (93.441 kg)  BMI 29.56 kg/m2  Weights Current Weight  06/22/12 206 lb (93.441 kg)  06/21/12 205 lb (92.987 kg)  05/10/10 198 lb (89.812 kg)    Blood Pressure  BP Readings from Last 3 Encounters:  06/22/12 128/83  06/21/12 120/80  04/23/10 125/74     Admit date:  (Not on file) Last encounter with RMR:  Visit date not found   Allergy No Known Allergies  Current Outpatient Prescriptions  Medication Sig Dispense Refill  . acetaminophen (TYLENOL) 325 MG tablet Take 650 mg by mouth every 6 (six) hours as needed. pain      . aspirin EC 81 MG tablet Take 81 mg by mouth daily.      [name] ibuprofen (ADVIL,MOTRIN) 200 MG tablet Take 200 mg by mouth every 6 (six) hours as needed. pain      . metoprolol (LOPRESSOR) 50 MG tablet Take 0.5 tablets (25 mg total) by mouth 2 (two) times daily.  30 tablet  0  . nitroGLYCERIN (NITROSTAT) 0.4 MG SL tablet Place 1 tablet (0.4 mg total) under the tongue every 5 (five) minutes as needed for chest pain.  30 tablet  0  . Omega-3 Fatty Acids (FISH OIL) 500 MG CAPS Take 2 capsules by mouth daily.        . ranitidine (ZANTAC) 150 MG tablet Take 150 mg by mouth daily.       No current facility-administered medications for this visit.   Facility-Administered Medications Ordered in Other Visits  Medication Dose Route Frequency Provider Last Rate Last Dose  . metoprolol (LOPRESSOR) tablet 25 mg  25 mg Oral Once Carleene Cooper III, MD   25 mg at 06/21/12 2208    Discontinued Meds:   There are no discontinued medications.  Patient Active Problem List  Diagnosis    . CARDIAC MURMUR  . CHEST PAIN  . ABNORMAL ELECTROCARDIOGRAM    LABS Admission on 06/21/2012, Discharged on 06/21/2012  Component Date Value  . Troponin I 06/21/2012 <0.30   . WBC 06/21/2012 8.6   . RBC 06/21/2012 5.08   . Hemoglobin 06/21/2012 15.2   . HCT 06/21/2012 44.8   . MCV 06/21/2012 88.2   . [facility] 06/21/2012 29.9   . MCHC 06/21/2012 33.9   . RDW 06/21/2012 12.3   . Platelets 06/21/2012 179   . Neutrophils Relative 06/21/2012 53   . Neutro Abs 06/21/2012 4.5   . Lymphocytes Relative 06/21/2012 33   . Lymphs Abs 06/21/2012 2.8   . Monocytes Relative 06/21/2012 10   . Monocytes Absolute 06/21/2012 0.8   . Eosinophils Relative 06/21/2012 3   . Eosinophils Absolute 06/21/2012 0.3   . Basophils Relative 06/21/2012 1   . Basophils Absolute 06/21/2012 0.1   . Sodium 06/21/2012 139   . Potassium 06/21/2012 4.5   . Chloride 06/21/2012 106   . CO2 06/21/2012 25   . Glucose, Bld 06/21/2012 100*  .  BUN 06/21/2012 15   . Creatinine, Ser 06/21/2012 1.12   . Calcium 06/21/2012 9.7   . GFR calc non Af Amer 06/21/2012 76*  . GFR calc Af Amer 06/21/2012 88*  . Color, Urine 06/21/2012 YELLOW   . APPearance 06/21/2012 CLEAR   . Specific Gravity, Urine 06/21/2012 1.015   . pH 06/21/2012 7.0   . Glucose, UA 06/21/2012 NEGATIVE   . Hgb urine dipstick 06/21/2012 NEGATIVE   . Bilirubin Urine 06/21/2012 NEGATIVE   . Ketones, ur 06/21/2012 NEGATIVE   . Protein, ur 06/21/2012 NEGATIVE   . Urobilinogen, UA 06/21/2012 1.0   . Nitrite 06/21/2012 NEGATIVE   . Leukocytes, UA 06/21/2012 NEGATIVE      Results for this Opt Visit:     Results for orders placed during the hospital encounter of 06/21/12  TROPONIN I      Component Value Range   Troponin I <0.30  <0.30 ng/mL  CBC WITH DIFFERENTIAL      Component Value Range   WBC 8.6  4.0 - 10.5 K/uL   RBC 5.08  4.22 - 5.81 MIL/uL   Hemoglobin 15.2  13.0 - 17.0 g/dL   HCT 16.1  09.6 - 04.5 %   MCV 88.2  78.0 - 100.0 fL   MCH 29.9   26.0 - 34.0 pg   MCHC 33.9  30.0 - 36.0 g/dL   RDW 40.9  81.1 - 91.4 %   Platelets 179  150 - 400 K/uL   Neutrophils Relative 53  43 - 77 %   Neutro Abs 4.5  1.7 - 7.7 K/uL   Lymphocytes Relative 33  12 - 46 %   Lymphs Abs 2.8  0.7 - 4.0 K/uL   Monocytes Relative 10  3 - 12 %   Monocytes Absolute 0.8  0.1 - 1.0 K/uL   Eosinophils Relative 3  0 - 5 %   Eosinophils Absolute 0.3  0.0 - 0.7 K/uL   Basophils Relative 1  0 - 1 %   Basophils Absolute 0.1  0.0 - 0.1 K/uL  BASIC METABOLIC PANEL      Component Value Range   Sodium 139  135 - 145 mEq/L   Potassium 4.5  3.5 - 5.1 mEq/L   Chloride 106  96 - 112 mEq/L   CO2 25  19 - 32 mEq/L   Glucose, Bld 100 (*) 70 - 99 mg/dL   BUN 15  6 - 23 mg/dL   Creatinine, Ser 7.82  0.50 - 1.35 mg/dL   Calcium 9.7  8.4 - 95.6 mg/dL   GFR calc non Af Amer 76 (*) >90 mL/min   GFR calc Af Amer 88 (*) >90 mL/min  URINALYSIS, ROUTINE W REFLEX MICROSCOPIC      Component Value Range   Color, Urine YELLOW  YELLOW   APPearance CLEAR  CLEAR   Specific Gravity, Urine 1.015  1.005 - 1.030   pH 7.0  5.0 - 8.0   Glucose, UA NEGATIVE  NEGATIVE mg/dL   Hgb urine dipstick NEGATIVE  NEGATIVE   Bilirubin Urine NEGATIVE  NEGATIVE   Ketones, ur NEGATIVE  NEGATIVE mg/dL   Protein, ur NEGATIVE  NEGATIVE mg/dL   Urobilinogen, UA 1.0  0.0 - 1.0 mg/dL   Nitrite NEGATIVE  NEGATIVE   Leukocytes, UA NEGATIVE  NEGATIVE    EKG Orders placed during the hospital encounter of 06/21/12  . EKG     Prior Assessment and Plan Problem List as of 06/22/2012  CARDIAC MURMUR   CHEST PAIN   ABNORMAL ELECTROCARDIOGRAM       Imaging: Dg Chest Portable 1 View  06/21/2012  *RADIOLOGY REPORT*  Clinical Data: Chest pain.  PORTABLE CHEST - 1 VIEW  Comparison: None.  Findings: Trachea is midline.  Heart size is accentuated by low lung volumes and AP technique.  There may be atelectasis or scarring along the left hemidiaphragm.  Lungs are otherwise clear. No pleural fluid.   IMPRESSION: No acute findings.  Original Report Authenticated By: Reyes Ivan, M.D.     [facility] Calculation: Score not calculated. Missing: Total Cholesterol, HDL

## 2012-06-22 NOTE — H&P (Signed)
Patient ID: Edward Fisher, male   DOB: February 20, 1966, 46 y.o.   MRN: [id number]  HPI: Edward Fisher has enjoyed generally good health with fairly limited cardiovascular risk factors except for a very strong family history.  Has had mild hyperlipidemia, but this has not been thought to require pharmacologic therapy.  He has never used tobacco products, does not have hypertension nor has there been any evidence for diabetes or metabolic syndrome.    Over the past 2 months, he has noted progressive chest pressure elicited by exertion and relieved by rest.  There has been associated dyspnea but no diaphoresis or nausea.  He was evaluated for this at the [facility] Department a month or 2 ago and started on ranitidine.  Symptoms progressed to the point where discomfort was frequent yesterday and caused by minimal activity.  The emergency department physician was advised by the cardiologist on call to start low dose metoprolol, provide sublingual nitroglycerin and schedule an outpatient appointment today.  Patient has not yet filled those prescriptions, but has been relatively free of symptoms overnight.  Walking in from the parking lot caused recurrent chest discomfort that has now resolved.  Current Facility-Administered Medications on File Prior to Encounter  Medication Dose Route Frequency Provider Last Rate Last Dose  . metoprolol (LOPRESSOR) tablet 25 mg  25 mg Oral Once Carleene Cooper III, MD   25 mg at 06/21/12 2208   Current Outpatient Prescriptions on File Prior to Encounter  Medication Sig Dispense Refill  . acetaminophen (TYLENOL) 325 MG tablet Take 650 mg by mouth every 6 (six) hours as needed. pain      . aspirin EC 81 MG tablet Take 81 mg by mouth daily.      [name] ibuprofen (ADVIL,MOTRIN) 200 MG tablet Take 200 mg by mouth every 6 (six) hours as needed. pain      . Omega-3 Fatty Acids (FISH OIL) 500 MG CAPS Take 2 capsules by mouth daily.        . ranitidine (ZANTAC) 150 MG tablet Take 150  mg by mouth daily.       No Known Allergies    Past Medical History  Diagnosis Date  . Hyperlipidemia     Not thought to warrant pharmacologic therapy  . Insomnia   . Nephrolithiasis   . Angina of effort     Onset in 04/2012    Past Surgical History  Procedure Date  . Tonsillectomy   . Rhinoplasty     Family History  Problem Relation Age of Onset  . Ulcers Father     Peptic ulcer disease  . Hypertension Mother   . Coronary artery disease Father     Stents  . Coronary artery disease Brother     CABG    History   Social History  . Marital Status: Single    Spouse Name: N/A    Number of Children: N/A  . Years of Education: N/A   Occupational History  . Aeronautical engineer   Social History Main Topics  . Smoking status: Never Smoker   . Smokeless tobacco: Never Used  . Alcohol Use: No  . Drug Use: No  . Sexually Active: Not on file   Other Topics Concern  . Not on file   Social History Narrative   Regular exercise: No    ROS: Denies orthopnea, PND, palpitations, lightheadedness, syncope, pedal edema, focal motor weakness, nausea or emesis.   All other systems reviewed and are  negative.  PHYSICAL EXAM: BP 126/82  Pulse 69  Temp 97.8 F (36.6 C) (Oral)  Resp 18  SpO2 98%  General-Well-developed; no acute distress Body Habitus-proportionate weight and height HEENT-[place]/AT; PERRL; EOM intact; conjunctiva and lids nl Neck-No JVD; no carotid bruits Endocrine-No thyromegaly Lungs-Clear lung fields; resonant percussion; normal I-to-E ratio Cardiovascular- normal PMI; normal S1 and S2 Abdomen-BS normal; soft and non-tender without masses or organomegaly Musculoskeletal-No deformities, cyanosis or clubbing Neurologic-Nl cranial nerves; symmetric strength and tone Skin- Warm, no significant lesions Extremities-Nl distal pulses; no edema  EKG:  Tracing performed 06/21/12 obtained and reviewed-normal sinus rhythm, minor nonspecific ST-T wave  abnormality, nondiagnostic inferior Q waves.   ASSESSMENT AND PLAN: The patient presents with classic exertional angina that has been rapidly progressive in recent days.  The likelihood of underlying coronary disease exceeds 90%.  There is a significant risk for myocardial infarction in the near-term.  Patient will be admitted to [facility], treated with antianginal medication overnight and will undergo cardiac catheterization on 06/22/12.  The risks and benefits of the procedure were explained including myocardial infarction, CVA and death.  He agrees to proceed.  Cartwright Bing, MD 06/22/2012 5:31 PM

## 2012-06-22 NOTE — Progress Notes (Signed)
Patient ID: Edward Fisher, male   DOB: 1966/08/13, 46 y.o.   MRN: [id number]  HPI: Edward Fisher is seen urgently at the request of the emergency department physician, who evaluated him yesterday for chest discomfort.  This nice gentleman has enjoyed generally good health with fairly limited cardiovascular risk factors except for a very strong family history.  Has had mild hyperlipidemia, but this has not been thought to require pharmacologic therapy.  He has never used tobacco products, does not have hypertension nor has there been any evidence for diabetes or metabolic syndrome.    Over the past 2 months, he has noted progressive chest pressure elicited by exertion and relieved by rest.  There has been associated dyspnea but no diaphoresis or nausea.  He was evaluated for this at the [facility] Department a month or 2 ago and started on ranitidine.  Symptoms progressed to the point where discomfort was frequent yesterday and caused by minimal activity.  The emergency department physician was advised by the cardiologist on call to start low dose metoprolol, provide sublingual nitroglycerin and schedule an outpatient appointment today.  Patient has not yet filled those prescriptions, but has been relatively free of symptoms overnight.  Walking in from the parking lot caused recurrent chest discomfort that has now resolved.  Current Facility-Administered Medications on File Prior to Visit  Medication Dose Route Frequency Provider Last Rate Last Dose  . metoprolol (LOPRESSOR) tablet 25 mg  25 mg Oral Once Carleene Cooper III, MD   25 mg at 06/21/12 2208   Current Outpatient Prescriptions on File Prior to Visit  Medication Sig Dispense Refill  . acetaminophen (TYLENOL) 325 MG tablet Take 650 mg by mouth every 6 (six) hours as needed. pain      . aspirin EC 81 MG tablet Take 81 mg by mouth daily.      [name] ibuprofen (ADVIL,MOTRIN) 200 MG tablet Take 200 mg by mouth every 6 (six) hours as needed. pain       . Omega-3 Fatty Acids (FISH OIL) 500 MG CAPS Take 2 capsules by mouth daily.        . ranitidine (ZANTAC) 150 MG tablet Take 150 mg by mouth daily.       No Known Allergies    Past Medical History  Diagnosis Date  . Hyperlipidemia     Not thought to warrant pharmacologic therapy  . Insomnia   . Nephrolithiasis   . Angina of effort     Onset in 04/2012    Past Surgical History  Procedure Date  . Tonsillectomy   . Rhinoplasty     Family History  Problem Relation Age of Onset  . Ulcers Father     Peptic ulcer disease  . Hypertension Mother   . Coronary artery disease Father     Stents  . Coronary artery disease Brother     CABG    History   Social History  . Marital Status: Single    Spouse Name: N/A    Number of Children: N/A  . Years of Education: N/A   Occupational History  . Aeronautical engineer   Social History Main Topics  . Smoking status: Never Smoker   . Smokeless tobacco: Never Used  . Alcohol Use: No  . Drug Use: No  . Sexually Active: Not on file   Other Topics Concern  . Not on file   Social History Narrative   Regular exercise: No    ROS:  Denies orthopnea, PND, palpitations, lightheadedness, syncope, pedal edema, focal motor weakness, nausea or emesis.   All other systems reviewed and are negative.  PHYSICAL EXAM: BP 128/83  Pulse 70  Ht 5\' 10"  (1.778 m)  Wt 93.441 kg (206 lb)  BMI 29.56 kg/m2  General-Well-developed; no acute distress Body Habitus-proportionate weight and height HEENT-[place]/AT; PERRL; EOM intact; conjunctiva and lids nl Neck-No JVD; no carotid bruits Endocrine-No thyromegaly Lungs-Clear lung fields; resonant percussion; normal I-to-E ratio Cardiovascular- normal PMI; normal S1 and S2 Abdomen-BS normal; soft and non-tender without masses or organomegaly Musculoskeletal-No deformities, cyanosis or clubbing Neurologic-Nl cranial nerves; symmetric strength and tone Skin- Warm, no significant  lesions Extremities-Nl distal pulses; no edema  EKG:  Tracing performed 06/21/12 obtained and reviewed-normal sinus rhythm, minor nonspecific ST-T wave abnormality, nondiagnostic inferior Q waves.   ASSESSMENT AND PLAN: The patient presents with classic exertional angina that has been rapidly progressive in recent days.  The likelihood of underlying coronary disease exceeds 90%.  There is a significant risk for myocardial infarction in the near-term.  Patient will be admitted to [facility], treated with antianginal medication overnight and will undergo cardiac catheterization on 06/22/12.  The risks and benefits of the procedure were explained including myocardial infarction, CVA and death.  He agrees to proceed.  [place] Bing, MD 06/22/2012 5:17 PM

## 2012-06-23 ENCOUNTER — Encounter (HOSPITAL_COMMUNITY)
Admission: AD | Disposition: A | Discharge status: Home / Self Care | Payer: Self-pay | Source: Ambulatory Visit | Attending: Cardiothoracic Surgery

## 2012-06-23 ENCOUNTER — Inpatient Hospital Stay (HOSPITAL_COMMUNITY): Payer: Self-pay

## 2012-06-23 ENCOUNTER — Other Ambulatory Visit: Payer: Self-pay

## 2012-06-23 DIAGNOSIS — I200 Unstable angina: Secondary | ICD-10-CM

## 2012-06-23 DIAGNOSIS — Z01810 Encounter for preprocedural cardiovascular examination: Secondary | ICD-10-CM

## 2012-06-23 DIAGNOSIS — I2510 Atherosclerotic heart disease of native coronary artery without angina pectoris: Secondary | ICD-10-CM

## 2012-06-23 HISTORY — PX: LEFT HEART CATHETERIZATION WITH CORONARY ANGIOGRAM: SHX5451

## 2012-06-23 LAB — BASIC METABOLIC PANEL
BUN: 14 mg/dL (ref 6–23)
CO2: 22 mEq/L (ref 19–32)
Chloride: 107 mEq/L (ref 96–112)
Creatinine, Ser: 0.91 mg/dL (ref 0.50–1.35)
Potassium: 3.9 mEq/L (ref 3.5–5.1)

## 2012-06-23 LAB — ABO/RH: ABO/RH(D): B POS

## 2012-06-23 LAB — PULMONARY FUNCTION TEST

## 2012-06-23 LAB — HEPARIN LEVEL (UNFRACTIONATED): Heparin Unfractionated: 0.15 IU/mL — ABNORMAL LOW (ref 0.30–0.70)

## 2012-06-23 IMAGING — CR DG CHEST 2V
2 series · 2 of 2 positions shown · non-contrast
Comparison: [DATE]

CLINICAL DATA: Preop CABG.

CHEST - 2 VIEW

[w chest pa]
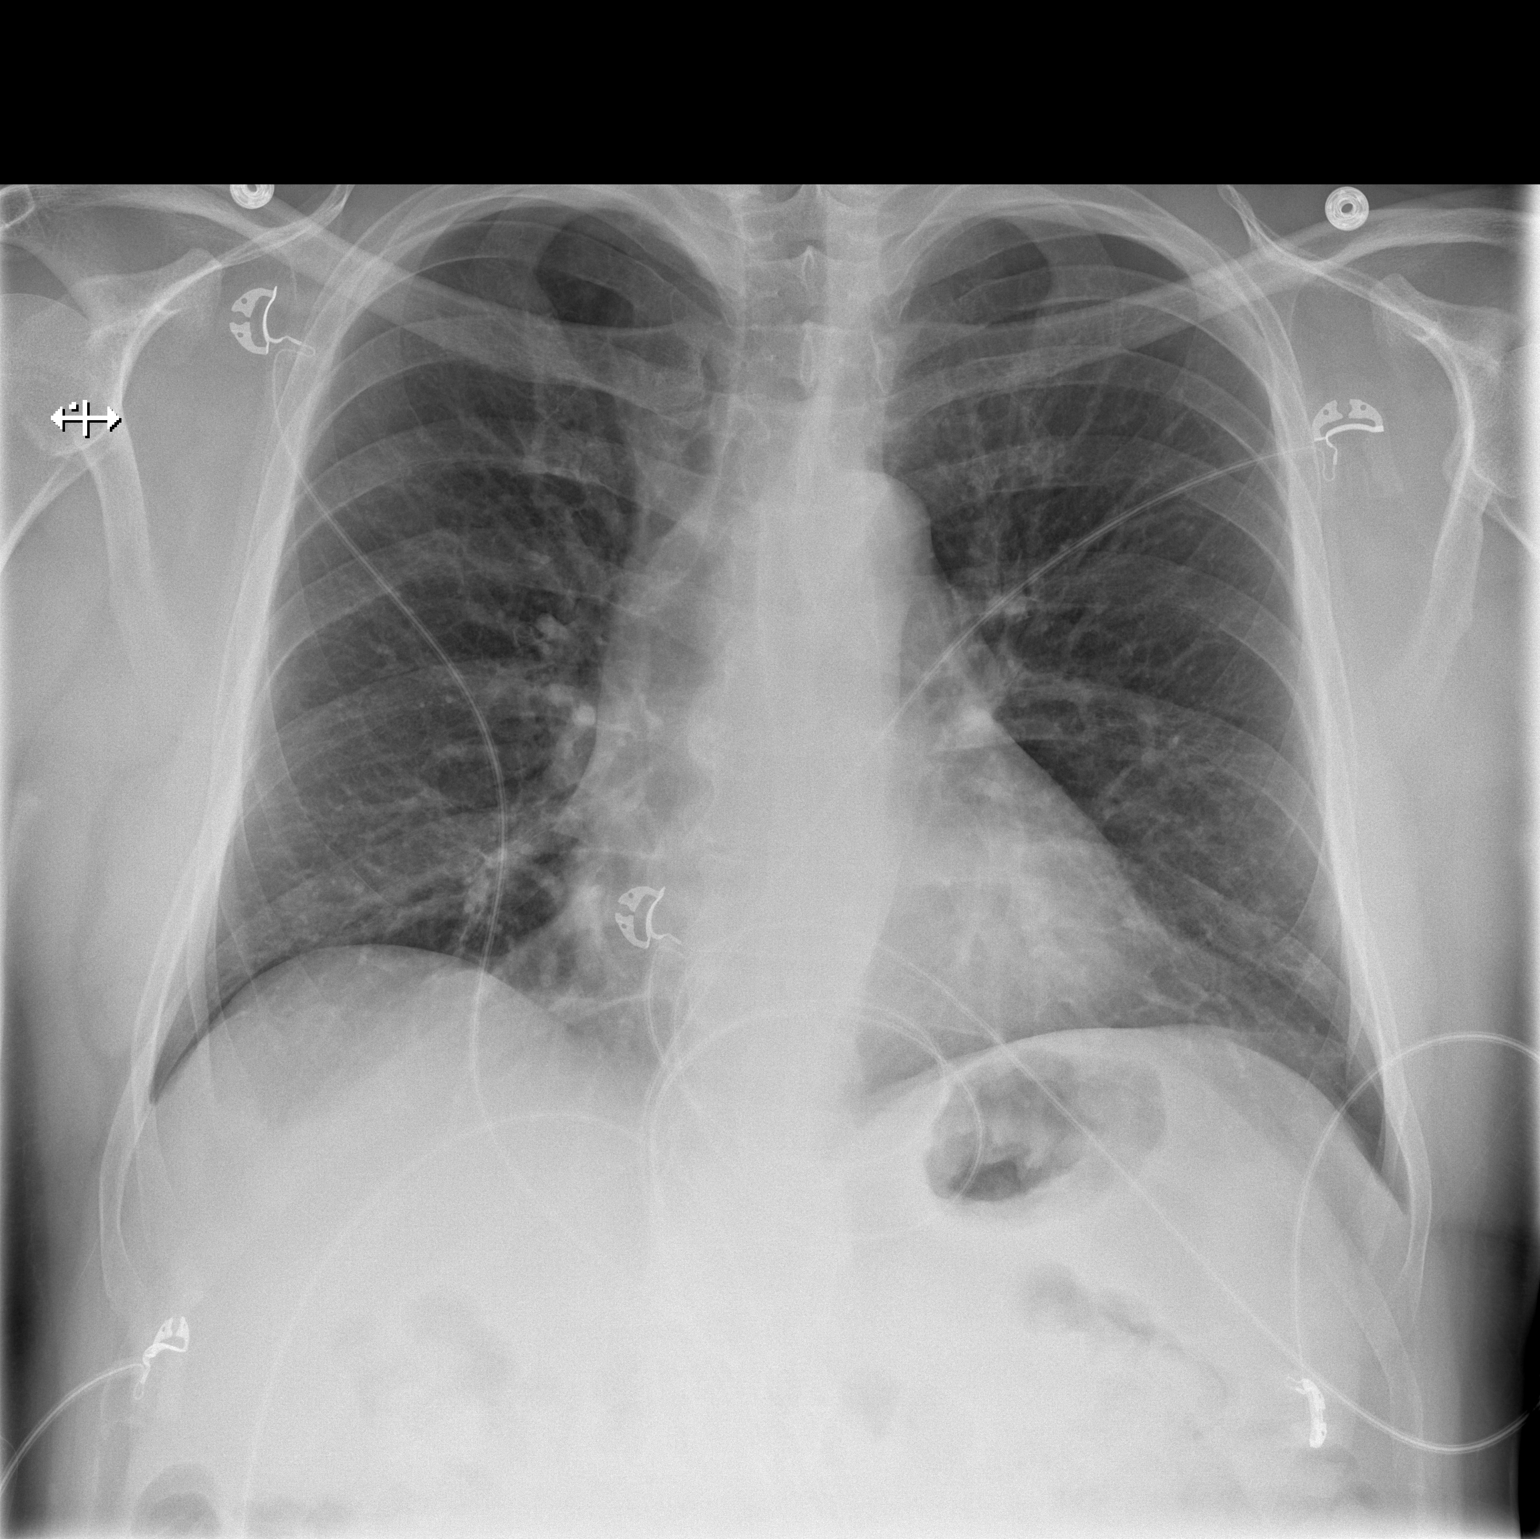

[w chest lat]
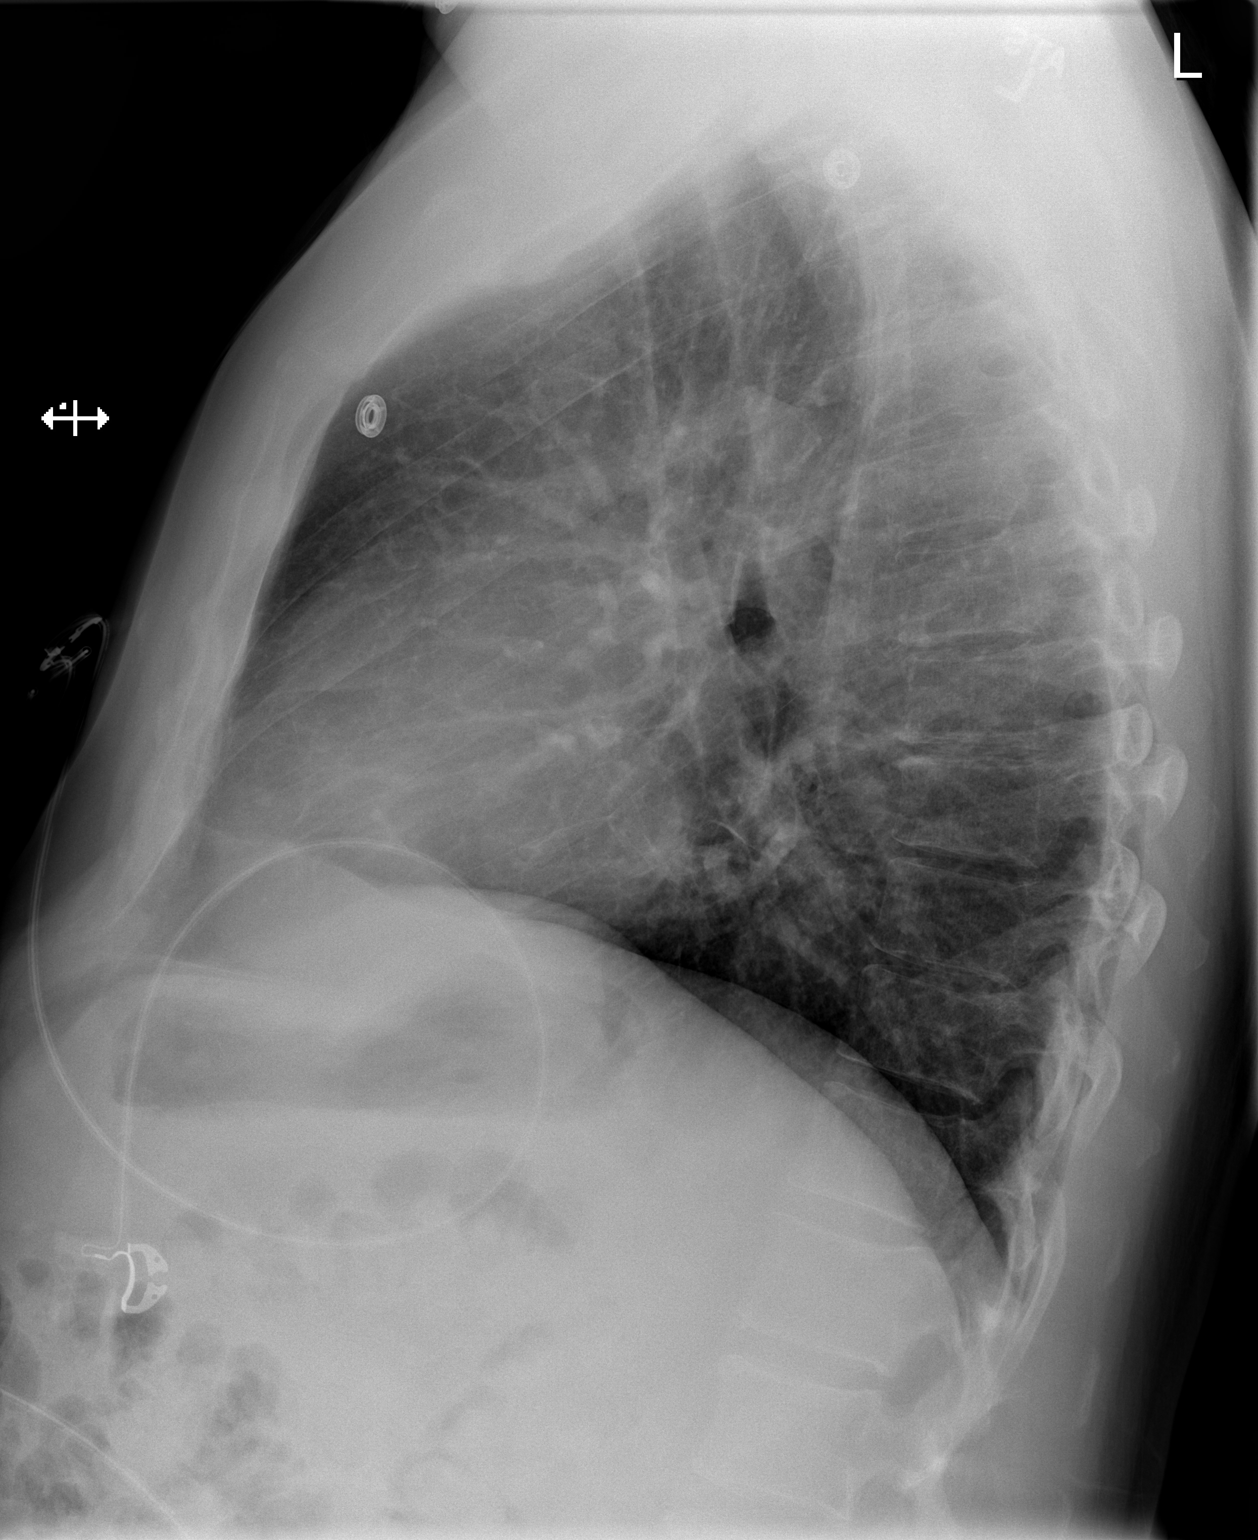

[2 of 2 positions shown; findings below may reference images not displayed]

FINDINGS: Mild chronic peribronchial thickening.  Mild interstitial
prominence, particularly in the lung bases.  Heart is normal size.
No effusions or acute bony abnormality.
IMPRESSION: Chronic bronchitic changes and increased markings in the lung
bases.  No acute findings.

## 2012-06-23 SURGERY — LEFT HEART CATHETERIZATION WITH CORONARY ANGIOGRAM
Anesthesia: LOCAL

## 2012-06-23 MED ORDER — EPINEPHRINE HCL 1 MG/ML IJ SOLN
0.5000 ug/min | INTRAVENOUS | Status: DC
  Filled 2012-06-23: qty 4

## 2012-06-23 MED ORDER — DOPAMINE-DEXTROSE 3.2-5 MG/ML-% IV SOLN
2.0000 ug/kg/min | INTRAVENOUS | Status: DC
  Filled 2012-06-23: qty 250

## 2012-06-23 MED ORDER — PROMETHAZINE HCL 25 MG/ML IJ SOLN
INTRAMUSCULAR | Status: AC
  Filled 2012-06-23: qty 1

## 2012-06-23 MED ORDER — HEPARIN SODIUM (PORCINE) 1000 UNIT/ML IJ SOLN
INTRAMUSCULAR | Status: AC
  Filled 2012-06-23: qty 1

## 2012-06-23 MED ORDER — SODIUM CHLORIDE 0.9 % IJ SOLN: 3.0000 mL | Freq: Two times a day (BID) | INTRAMUSCULAR | Status: DC

## 2012-06-23 MED ORDER — ALPRAZOLAM 0.25 MG PO TABS: 0.2500 mg | ORAL_TABLET | ORAL | Status: DC | PRN

## 2012-06-23 MED ORDER — VERAPAMIL HCL 2.5 MG/ML IV SOLN
INTRAVENOUS | Status: AC
  Filled 2012-06-23: qty 2

## 2012-06-23 MED ORDER — CHLORHEXIDINE GLUCONATE 4 % EX LIQD
60.0000 mL | Freq: Once | CUTANEOUS | Status: AC
  Administered 2012-06-24: 4 via TOPICAL
  Filled 2012-06-23: qty 60

## 2012-06-23 MED ORDER — MAGNESIUM SULFATE 50 % IJ SOLN
40.0000 meq | INTRAMUSCULAR | Status: DC
  Filled 2012-06-23: qty 10

## 2012-06-23 MED ORDER — FENTANYL CITRATE 0.05 MG/ML IJ SOLN
INTRAMUSCULAR | Status: AC
  Filled 2012-06-23: qty 2

## 2012-06-23 MED ORDER — DEXTROSE 5 % IV SOLN
1.5000 g | INTRAVENOUS | Status: AC
  Administered 2012-06-24: 1.5 g via INTRAVENOUS
  Administered 2012-06-24: 750 g via INTRAVENOUS
  Filled 2012-06-23 (×2): qty 1.5

## 2012-06-23 MED ORDER — SODIUM CHLORIDE 0.9 % IV SOLN
INTRAVENOUS | Status: DC
  Filled 2012-06-23: qty 1

## 2012-06-23 MED ORDER — SODIUM CHLORIDE 0.9 % IV SOLN
1.0000 mL/kg/h | INTRAVENOUS | Status: AC
  Administered 2012-06-23: 1 mL/kg/h via INTRAVENOUS

## 2012-06-23 MED ORDER — TEMAZEPAM 15 MG PO CAPS: 15.0000 mg | ORAL_CAPSULE | Freq: Once | ORAL | Status: AC | PRN

## 2012-06-23 MED ORDER — TRANEXAMIC ACID (OHS) BOLUS VIA INFUSION
15.0000 mg/kg | INTRAVENOUS | Status: AC
  Administered 2012-06-24: 1401 mg via INTRAVENOUS
  Filled 2012-06-23: qty 1401

## 2012-06-23 MED ORDER — ASPIRIN 81 MG PO CHEW
CHEWABLE_TABLET | ORAL | Status: AC
  Filled 2012-06-23: qty 4

## 2012-06-23 MED ORDER — VANCOMYCIN HCL 1000 MG IV SOLR
1500.0000 mg | INTRAVENOUS | Status: AC
  Administered 2012-06-24: 1500 mg via INTRAVENOUS
  Filled 2012-06-23: qty 1500

## 2012-06-23 MED ORDER — MIDAZOLAM HCL 2 MG/2ML IJ SOLN
INTRAMUSCULAR | Status: AC
  Filled 2012-06-23: qty 2

## 2012-06-23 MED ORDER — DEXTROSE 5 % IV SOLN
750.0000 mg | INTRAVENOUS | Status: DC
  Filled 2012-06-23 (×2): qty 750

## 2012-06-23 MED ORDER — PHENYLEPHRINE HCL 10 MG/ML IJ SOLN
30.0000 ug/min | INTRAVENOUS | Status: DC
  Filled 2012-06-23: qty 2

## 2012-06-23 MED ORDER — ONDANSETRON HCL 4 MG/2ML IJ SOLN: 4.0000 mg | Freq: Four times a day (QID) | INTRAMUSCULAR | Status: DC | PRN

## 2012-06-23 MED ORDER — TRANEXAMIC ACID 100 MG/ML IV SOLN
1.5000 mg/kg/h | INTRAVENOUS | Status: DC
  Filled 2012-06-23: qty 25

## 2012-06-23 MED ORDER — POTASSIUM CHLORIDE 2 MEQ/ML IV SOLN
80.0000 meq | INTRAVENOUS | Status: DC
  Filled 2012-06-23: qty 40

## 2012-06-23 MED ORDER — BISACODYL 5 MG PO TBEC
5.0000 mg | DELAYED_RELEASE_TABLET | Freq: Once | ORAL | Status: AC
  Administered 2012-06-23: 18:00:00 5 mg via ORAL
  Filled 2012-06-23: qty 1

## 2012-06-23 MED ORDER — LIDOCAINE HCL (PF) 1 % IJ SOLN
INTRAMUSCULAR | Status: AC
  Filled 2012-06-23: qty 30

## 2012-06-23 MED ORDER — HEPARIN (PORCINE) IN NACL 100-0.45 UNIT/ML-% IJ SOLN
1400.0000 [IU]/h | INTRAMUSCULAR | Status: DC
  Administered 2012-06-23: 1100 [IU]/h via INTRAVENOUS
  Filled 2012-06-23 (×3): qty 250

## 2012-06-23 MED ORDER — SODIUM CHLORIDE 0.9 % IV SOLN: 250.0000 mL | INTRAVENOUS | Status: DC

## 2012-06-23 MED ORDER — NITROGLYCERIN 0.2 MG/ML ON CALL CATH LAB
INTRAVENOUS | Status: AC
  Filled 2012-06-23: qty 1

## 2012-06-23 MED ORDER — ACETAMINOPHEN 325 MG PO TABS: 650.0000 mg | ORAL_TABLET | ORAL | Status: DC | PRN

## 2012-06-23 MED ORDER — HEPARIN (PORCINE) IN NACL 2-0.9 UNIT/ML-% IJ SOLN
INTRAMUSCULAR | Status: AC
  Filled 2012-06-23: qty 2000

## 2012-06-23 MED ORDER — DEXMEDETOMIDINE HCL IN NACL 400 MCG/100ML IV SOLN
0.1000 ug/kg/h | INTRAVENOUS | Status: AC
  Administered 2012-06-24: .2 ug/h via INTRAVENOUS
  Filled 2012-06-23: qty 100

## 2012-06-23 MED ORDER — NITROGLYCERIN IN D5W 200-5 MCG/ML-% IV SOLN
2.0000 ug/min | INTRAVENOUS | Status: DC
  Filled 2012-06-23: qty 250

## 2012-06-23 MED ORDER — TRANEXAMIC ACID (OHS) PUMP PRIME SOLUTION
2.0000 mg/kg | INTRAVENOUS | Status: DC
  Filled 2012-06-23: qty 1.87

## 2012-06-23 MED ORDER — SODIUM CHLORIDE 0.9 % IJ SOLN: 3.0000 mL | INTRAMUSCULAR | Status: DC | PRN

## 2012-06-23 MED ORDER — ~~LOC~~ CARDIAC SURGERY, PATIENT & FAMILY EDUCATION
Freq: Once | Status: AC
  Administered 2012-06-23: 13:00:00
  Filled 2012-06-23: qty 1

## 2012-06-23 MED ORDER — SODIUM BICARBONATE 8.4 % IV SOLN
INTRAVENOUS | Status: AC
  Administered 2012-06-24: 11:00:00
  Filled 2012-06-23: qty 2.5

## 2012-06-23 MED ORDER — METOPROLOL TARTRATE 12.5 MG HALF TABLET
12.5000 mg | ORAL_TABLET | Freq: Once | ORAL | Status: AC
  Administered 2012-06-24: 06:00:00 12.5 mg via ORAL
  Filled 2012-06-23: qty 1

## 2012-06-23 NOTE — CV Procedure (Signed)
Cardiac Catheterization Procedure Note  Name: Edward Fisher MRN: [id number] DOB: 1966/06/05  Procedure: Left Heart Cath, Selective Coronary Angiography, LV angiography  Indication: 46 year old gentleman presenting with classic symptoms of unstable angina. He has no traditional risk factors for CAD except for a very strong family history of such.   Procedural Details: The right wrist was prepped, draped, and anesthetized with 1% lidocaine. Using the modified Seldinger technique, a 5 French sheath was introduced into the right radial artery. 3 mg of verapamil was administered through the sheath, weight-based unfractionated heparin was administered intravenously. Standard Judkins catheters were used for selective coronary angiography and left ventriculography. Catheter exchanges were performed over an exchange length guidewire. There were no immediate procedural complications. A TR band was used for radial hemostasis at the completion of the procedure.  The patient was transferred to the post catheterization recovery area for further monitoring.  Procedural Findings: Hemodynamics: AO 109/71 LV 109/14  Coronary angiography: Coronary dominance: right  Left mainstem:  The left mainstem is patent. There is mild nonobstructive plaque in the distal left main and I would estimate at 20-30%.  Left anterior descending (LAD): The proximal LAD has diffuse irregularity, but tapers with 50% stenosis leading into the first diagonal branch. The first diagonal branch covers a large territory and has no significant obstructive disease. The LAD just beyond the first diagonal has critical stenosis with 95-99% disease followed by severe diffuse disease throughout the midportion of the vessel. The distal vessel fills competitively from antegrade flow and collateral flow supplied by the terminal portion of the RCA.  Left circumflex (LCx): The AV groove circumflex is patent throughout the proximal aspect. It  supplies a large atrial branch. The vessel gives off one large obtuse marginal branch that has a 95% stenosis in its proximal aspect. The mid and distal portions of the OM are widely patent.  Right coronary artery (RCA): The RCA is a large, dominant vessel. There is moderate calcification throughout the course of the right coronary artery. There is mild diffuse nonobstructive plaque throughout the vessel. There is a focal area of moderately severe stenosis in the midportion of the vessel but I would estimate at 80%. Distally, the vessel divides into a PDA branch and 2 posterolateral branches. The PDA branch is large throughout its proximal segment. The midportion of the PDA has a tight 95% stenosis and then the terminal portion of the PDA is free of significant disease. The first posterolateral branch is large in caliber and has 50% stenosis in its proximal aspect. There is no high-grade stenosis throughout that PL branch. The second posterolateral was small.  Left ventriculography: Left ventricular systolic function is normal, LVEF is estimated at 55-65%, there is no significant mitral regurgitation   Final Conclusions:   1. Severe three-vessel coronary artery disease as outlined 2. Normal LV function  Recommendations: The patient has severe multivessel coronary artery disease at a young age. He has classic symptoms of unstable angina and requires revascularization while he is here in the hospital to prevent progressive ischemia and infarction. I think he would be best treated with coronary bypass surgery, primarily because of the diffuse nature of his LAD stenosis. Considering his young age, hybrid revascularization would be a reasonable option, especially if his PDA was not graftable beyond the area of tight stenosis in its midportion. I will discuss with the surgical team and I appreciate their consultation. The findings were discussed with the patient and his wife in depth. He will be started  on IV  heparin after his sheath is out.  Edward Fisher 06/23/2012, 10:10 AM

## 2012-06-23 NOTE — Consult Note (Signed)
[address]            [place] [address]          [phone]       [name] Quintana Medical Record #[id number] Date of Birth: 06/20/1966  Referring: Dr Excell Seltzer, Dr Dietrich Pates primary cardiologist Primary Care: [facility], uses [facility] department  Chief Complaint:   Rest angina   History of Present Illness:    46 year old male who underwent cardiac catheterization today and found to have 3 vessel disease with normal LV function referred for CT surgery for cabg. Mr. Palmatier has enjoyed generally good health with fairly limited cardiovascular risk factors except for a very strong family history. Has had mild hyperlipidemia, but this has not been thought to require pharmacologic therapy. He has never used tobacco products, does not have hypertension nor has there been any evidence for diabetes or metabolic syndrome.  Over the past 2 months, he has noted progressive chest pressure elicited by exertion and relieved by rest. There has been associated dyspnea but no diaphoresis or nausea. He was evaluated for this at the [facility] Department a month or 2 ago and started on ranitidine. Symptoms progressed to the point where discomfort was frequent yesterday and caused by minimal activity. The emergency department physician was advised by the cardiologist on call to start low dose metoprolol, provide sublingual nitroglycerin and schedule an outpatient appointment today. Patient has not yet filled those prescriptions, but has been relatively free of symptoms overnight. Walking in from the parking lot caused recurrent chest discomfort that has now resolved.     Current Activity/ Functional Status: Patient is independent with mobility/ambulation, transfers, ADL's, IADL's.   Past Medical History  Diagnosis Date  . Hyperlipidemia     Not thought to warrant pharmacologic therapy  . Insomnia   . Angina of effort     Onset in 04/2012  . Nephrolithiasis      Past Surgical History  Procedure Date  . Rhinoplasty   . Tonsillectomy ~1971    History  Smoking status  . Never Smoker   Smokeless tobacco  . Former Neurosurgeon  . Types: Chew  . Quit date: 11/04/1998  Comment: 06/22/2012 "quit chewing 10-15 years ago"    History  Alcohol Use No    History   Social History  . Marital Status: Married    Spouse Name: N/A    Number of Children: 2  . Years of Education: N/A   Occupational History  . Aeronautical engineer   Social History Main Topics  . Smoking status: Never Smoker   . Smokeless tobacco: Former Neurosurgeon    Types: Chew    Quit date: 11/04/1998   Comment: 06/22/2012 "quit chewing 10-15 years ago"  . Alcohol Use: No  . Drug Use: No  . Sexually Active: Yes    Social History Narrative   Regular exercise: No    No Known Allergies  Current Facility-Administered Medications  Medication Dose Route Frequency Provider Last Rate Last Dose  . 0.9 %  sodium chloride infusion   Intravenous Continuous Kathlen Brunswick, MD 100 mL/hr at 06/23/12 0540    . acetaminophen (TYLENOL) tablet 650 mg  650 mg Oral Q4H PRN Kathlen Brunswick, MD      . ALPRAZolam Prudy Feeler) tablet 0.25 mg  0.25 mg Oral BID PRN Gerrit Friends  Rothbart, MD      . aspirin 81 MG chewable tablet           . aspirin EC tablet 325 mg  325 mg Oral Daily Kathlen Brunswick, MD   325 mg at 06/22/12 1833  . atorvastatin (LIPITOR) tablet 80 mg  80 mg Oral q1800 Kathlen Brunswick, MD      . fentaNYL (SUBLIMAZE) 0.05 MG/ML injection           . heparin 1000 UNIT/ML injection           . heparin 2-0.9 UNIT/ML-% infusion           . heparin injection 5,000 Units  5,000 Units Subcutaneous Q8H Kathlen Brunswick, MD   5,000 Units at 06/23/12 0544  . lidocaine (XYLOCAINE) 1 % injection           . metoprolol tartrate (LOPRESSOR) tablet 25 mg  25 mg Oral BID Kathlen Brunswick, MD   25 mg at 06/22/12 2303  . midazolam (VERSED) 2 MG/2ML injection           . nitroGLYCERIN  (NITROLINGUAL) 0.4 MG/SPRAY spray 1 spray  1 spray Sublingual Q5 min PRN Kathlen Brunswick, MD      . nitroGLYCERIN (NTG ON-CALL) 0.2 mg/mL injection           . ondansetron (ZOFRAN) injection 4 mg  4 mg Intravenous Q6H PRN Kathlen Brunswick, MD      . sodium chloride 0.9 % injection 3 mL  3 mL Intravenous Q12H Kathlen Brunswick, MD      . verapamil (ISOPTIN) 2.5 MG/ML injection           . zolpidem (AMBIEN) tablet 5 mg  5 mg Oral QHS PRN,MR X 1 Kathlen Brunswick, MD        Prescriptions prior to admission  Medication Sig Dispense Refill  . acetaminophen (TYLENOL) 325 MG tablet Take 650 mg by mouth every 6 (six) hours as needed. pain      . aspirin EC 81 MG tablet Take 81 mg by mouth daily.      [name] ibuprofen (ADVIL,MOTRIN) 200 MG tablet Take 200 mg by mouth every 6 (six) hours as needed. pain      . metoprolol (LOPRESSOR) 50 MG tablet Take 25 mg by mouth 2 (two) times daily.      . nitroGLYCERIN (NITROSTAT) 0.4 MG SL tablet Place 0.4 mg under the tongue every 5 (five) minutes as needed. For chest pain.      . Omega-3 Fatty Acids (FISH OIL) 500 MG CAPS Take 2 capsules by mouth daily.        . ranitidine (ZANTAC) 150 MG tablet Take 150 mg by mouth daily.        Family History  Problem Relation Age of Onset  . Ulcers Father     Peptic ulcer disease  . Hypertension Mother   . Coronary artery disease Father     Stents  . Coronary artery disease Brother     CABG     Review of Systems:     Cardiac Review of Systems: Y or N  Chest Pain [ y   ]  Resting SOB Cove.Etienne   ] Exertional SOB  [ y ]  Orthopnea [ n ]   Pedal Edema [ n  ]    Palpitations [n  ] Syncope  [ n ]   Presyncope [n   ]  General Review of Systems: [Y] =  yes [  ]=no Constitional: recent weight change [  ]; anorexia [  ]; fatigue [ y ]; nausea [  ]; night sweats [  ]; fever [  ]; or chills [  ];                                                                                                                                            Dental: poor dentition[  ]; Last Dentist visit: no recent  Eye : blurred vision [  ]; diplopia [   ]; vision changes [  ];  Amaurosis fugax[  ]; Resp: cough [  ];  wheezing[  ];  hemoptysis[  ]; shortness of breath[y  ]; paroxysmal nocturnal dyspnea[ n ]; dyspnea on exertion[  ]; or orthopnea[  ];  GI:  gallstones[  ], vomiting[  ];  dysphagia[  ]; melena[  ];  hematochezia [  ]; heartburn[ ] ;   Hx of  Colonoscopy[  ]; GU: kidney stones Cove.Etienne  ]; hematuria[  ];   dysuria [  ];  nocturia[  ];  history of     obstruction [  ];             Skin: rash, swelling[  ];, hair loss[  ];  peripheral edema[  ];  or itching[  ]; Musculosketetal: myalgias[  ];  joint swelling[  ];  joint erythema[  ];  joint pain[  ];  back pain[  ];  Heme/Lymph: bruising[  ];  bleeding[  ];  anemia[  ];  Neuro: TIA[  ];  headaches[  ];  stroke[  ];  vertigo[  ];  seizures[  ];   paresthesias[  ];  difficulty walking[  ];  Psych:depression[  ]; anxiety[  ];  Endocrine: diabetes[  ];  thyroid dysfunction[  ];  Immunizations: Flu [  ]; Pneumococcal[  ];  Other: Physical Exam: BP 101/62  Pulse 60  Temp 98.1 F (36.7 C) (Oral)  Resp 18  Ht 5\' 10"  (1.778 m)  Wt 206 lb (93.441 kg)  BMI 29.56 kg/m2  SpO2 98% pulmonary embolism  Physical Examination: General appearance - alert, well appearing, and in no distress Eyes - pupils equal and reactive, extraocular eye movements intact, sclera anicteric Mouth - mucous membranes moist, pharynx normal without lesions Neck - supple, no significant adenopathy, no carotid bruits Lymphatics - no palpable lymphadenopathy Chest - clear to auscultation, no wheezes, rales or rhonchi, symmetric air entry Heart - normal rate, regular rhythm, normal S1, S2, no murmurs, rubs, clicks or gallops Abdomen - soft, nontender, nondistended, no masses or organomegaly GU Male - deferred Rectal - deferred Back exam - nontender, Neurological - alert, oriented, normal speech, no focal findings or  movement disorder noted Musculoskeletal - no joint tenderness, deformity or swelling Extremities - peripheral pulses normal, no pedal edema, no clubbing or cyanosis Skin - normal  coloration and turgor, no rashes, no suspicious skin lesions noted   Diagnostic Studies & Laboratory data:     Recent Radiology Findings:   Dg Chest Portable 1 View  06/21/2012  *RADIOLOGY REPORT*  Clinical Data: Chest pain.  PORTABLE CHEST - 1 VIEW  Comparison: None.  Findings: Trachea is midline.  Heart size is accentuated by low lung volumes and AP technique.  There may be atelectasis or scarring along the left hemidiaphragm.  Lungs are otherwise clear. No pleural fluid.  IMPRESSION: No acute findings.  Original Report Authenticated By: Reyes Ivan, M.D.      Recent Lab Findings: Lab Results  Component Value Date   WBC 8.7 06/22/2012   HGB 16.3 06/22/2012   HCT 48.3 06/22/2012   PLT 189 06/22/2012   GLUCOSE 100* 06/21/2012   CHOL 207* 06/22/2012   TRIG 185* 06/22/2012   HDL 30* 06/22/2012   LDLCALC 140* 06/22/2012   NA 139 06/21/2012   K 4.5 06/21/2012   CL 106 06/21/2012   CREATININE 1.03 06/22/2012   BUN 15 06/21/2012   CO2 25 06/21/2012   TSH 1.561 06/22/2012   INR 1.00 06/22/2012   Cardiac Catheterization Procedure Note  Name: OREE HISLOP  MRN: [id number]  DOB: [birth year]  Procedure: Left Heart Cath, Selective Coronary Angiography, LV angiography  Indication: 46 year old gentleman presenting with classic symptoms of unstable angina. He has no traditional risk factors for CAD except for a very strong family history of such.  Procedural Details: The right wrist was prepped, draped, and anesthetized with 1% lidocaine. Using the modified Seldinger technique, a 5 French sheath was introduced into the right radial artery. 3 mg of verapamil was administered through the sheath, weight-based unfractionated heparin was administered intravenously. Standard Judkins catheters were used for selective coronary  angiography and left ventriculography. Catheter exchanges were performed over an exchange length guidewire. There were no immediate procedural complications. A TR band was used for radial hemostasis at the completion of the procedure. The patient was transferred to the post catheterization recovery area for further monitoring.  Procedural Findings:  Hemodynamics:  AO 109/71  LV 109/14  Coronary angiography:  Coronary dominance: right  Left mainstem: The left mainstem is patent. There is mild nonobstructive plaque in the distal left main and I would estimate at 20-30%.  Left anterior descending (LAD): The proximal LAD has diffuse irregularity, but tapers with 50% stenosis leading into the first diagonal branch. The first diagonal branch covers a large territory and has no significant obstructive disease. The LAD just beyond the first diagonal has critical stenosis with 95-99% disease followed by severe diffuse disease throughout the midportion of the vessel. The distal vessel fills competitively from antegrade flow and collateral flow supplied by the terminal portion of the RCA.  Left circumflex (LCx): The AV groove circumflex is patent throughout the proximal aspect. It supplies a large atrial branch. The vessel gives off one large obtuse marginal branch that has a 95% stenosis in its proximal aspect. The mid and distal portions of the OM are widely patent.  Right coronary artery (RCA): The RCA is a large, dominant vessel. There is moderate calcification throughout the course of the right coronary artery. There is mild diffuse nonobstructive plaque throughout the vessel. There is a focal area of moderately severe stenosis in the midportion of the vessel but I would estimate at 80%. Distally, the vessel divides into a PDA branch and 2 posterolateral branches. The PDA branch is large throughout its proximal segment. The  midportion of the PDA has a tight 95% stenosis and then the terminal portion of the PDA  is free of significant disease. The first posterolateral branch is large in caliber and has 50% stenosis in its proximal aspect. There is no high-grade stenosis throughout that PL branch. The second posterolateral was small.  Left ventriculography: Left ventricular systolic function is normal, LVEF is estimated at 55-65%, there is no significant mitral regurgitation  Final Conclusions:  1. Severe three-vessel coronary artery disease as outlined  2. Normal LV function  Recommendations: The patient has severe multivessel coronary artery disease at a young age. He has classic symptoms of unstable angina and requires revascularization while he is here in the hospital to prevent progressive ischemia and infarction. I think he would be best treated with coronary bypass surgery, primarily because of the diffuse nature of his LAD stenosis. Considering his young age, hybrid revascularization would be a reasonable option, especially if his PDA was not graftable beyond the area of tight stenosis in its midportion. I will discuss with the surgical team and I appreciate their consultation. The findings were discussed with the patient and his wife in depth. He will be started on IV heparin after his sheath is out.  [name]  06/23/2012, 10:10 AM    Assessment / Plan:  Severe 3VD for cabg  Unstable Angina with three vessel coronary disease  dyslipidemia with LDH 140, HDL 30  Reviewed the films of his coronary anatomy and discussed the case with Dr. Excell Seltzer and agree with the complex LAD lesion that coronary artery bypass grafting offers the best long-term solution. We're evaluate the palmar arch in his nondominant arm and consider use of radial artery also.  I've recommended to the patient that we proceed with coronary artery bypass grafting on this admission with his unstable anginal symptoms. Risks and options were discussed with them in detail including the possible use of the left radial artery as a bypass  in addition to the left internal mammary.  The goals risks and alternatives of the planned surgical procedure CABG poss left radial artery harvest   have been discussed with the patient in detail. The risks of the procedure including death, infection, stroke, myocardial infarction, bleeding, blood transfusion, neuro vascular compromise of hand if radial artery is used, have all been discussed specifically.  I have quoted [name] a 2 % of perioperative mortality and a complication rate as high as 15 %. The patient's questions have been answered.RASHARD RYLE is willing  to proceed with the planned procedure. Plan to proceed in am.    Delight Ovens MD Beeper [phone] Office [phone] 06/23/2012 3:07 PM

## 2012-06-23 NOTE — Progress Notes (Signed)
Patient Name: Edward Fisher Date of Encounter: 06/23/2012     Principal Problem:  *Unstable angina Active Problems:  Hyperlipidemia    SUBJECTIVE  No chest pain/sob.  Awaiting cath this AM.  CURRENT MEDS    . aspirin EC  325 mg Oral Daily  . atorvastatin  80 mg Oral q1800  . heparin  5,000 Units Subcutaneous Q8H  . metoprolol tartrate  25 mg Oral BID  . sodium chloride  3 mL Intravenous Q12H  . DISCONTD: atorvastatin  80 mg Oral NOW  . DISCONTD: atorvastatin  80 mg Oral Q0600  . DISCONTD: sodium chloride  3 mL Intravenous Q12H  . DISCONTD: sodium chloride  3 mL Intravenous Q12H    OBJECTIVE  Filed Vitals:   06/22/12 1710 06/22/12 2100 06/23/12 0157 06/23/12 0500  BP: 126/82 119/68  101/62  Pulse: 69 83  60  Temp: 97.8 F (36.6 C) 98.2 F (36.8 C)  98.1 F (36.7 C)  TempSrc: Oral   Oral  Resp: 18 18  18   Height:   5\' 10"  (1.778 m)   Weight:   206 lb (93.441 kg)   SpO2: 98% 99%  98%    Intake/Output Summary (Last 24 hours) at 06/23/12 0706 Last data filed at 06/23/12 0500  Gross per 24 hour  Intake    246 ml  Output      1 ml  Net    245 ml   Filed Weights   06/23/12 0157  Weight: 206 lb (93.441 kg)    PHYSICAL EXAM  General: Pleasant, NAD. Neuro: Alert and oriented X 3. Moves all extremities spontaneously. Psych: Normal affect. HEENT:  Normal  Neck: Supple without bruits or JVD. Lungs:  Resp regular and unlabored, CTA. Heart: RRR no s3, s4, or murmurs. Abdomen: Soft, non-tender, non-distended, BS + x 4.  Extremities: No clubbing, cyanosis or edema. DP/PT/Radials 2+ and equal bilaterally.  Nl Allen's on right.  Accessory Clinical Findings  CBC  Basename 06/22/12 1734 06/21/12 1800  WBC 8.7 8.6  NEUTROABS -- 4.5  HGB 16.3 15.2  HCT 48.3 44.8  MCV 88.6 88.2  PLT 189 179   Basic Metabolic Panel  Basename 06/22/12 1734 06/21/12 1800  NA -- 139  K -- 4.5  CL -- 106  CO2 -- 25  GLUCOSE -- 100*  BUN -- 15  CREATININE 1.03 1.12    CALCIUM -- 9.7  MG -- --  PHOS -- --   Cardiac Enzymes  Basename 06/22/12 1820 06/21/12 1800  CKTOTAL 38 --  CKMB 1.5 --  CKMBINDEX -- --  TROPONINI <0.30 <0.30   Fasting Lipid Panel  Basename 06/22/12 1820  CHOL 207*  HDL 30*  LDLCALC 140*  TRIG 185*  CHOLHDL 6.9  LDLDIRECT --   Thyroid Function Tests  Basename 06/22/12 1734  TSH 1.561  T4TOTAL --  T3FREE --  THYROIDAB --    TELE  rsr  ECG  Rsr, inf q's, left axis  Radiology/Studies  Dg Chest Portable 1 View  06/21/2012  *RADIOLOGY REPORT*  Clinical Data: Chest pain.  PORTABLE CHEST - 1 VIEW  Comparison: None.  Findings: Trachea is midline.  Heart size is accentuated by low lung volumes and AP technique.  There may be atelectasis or scarring along the left hemidiaphragm.  Lungs are otherwise clear. No pleural fluid.  IMPRESSION: No acute findings.  Original Report Authenticated By: Reyes Ivan, M.D.    ASSESSMENT AND PLAN  1.  [place]:  No further chest  pain.  CE negative.  For cath today.  Cont asa, statin, bb.  2.  HL:  LDL 140.  Cont high dose statin.  Signed, Nicolasa Ducking NP

## 2012-06-23 NOTE — Progress Notes (Addendum)
Pre-op Cardiac Surgery  Carotid Findings:  Bilaterally no significant ICA stenosis with antegrade vertebral flow.    Upper Extremity Right Left  Brachial Pressures 121 118  Radial Waveforms Tri Tri  Ulnar Waveforms Tri Tri  Palmar Arch (Allen's Test) Normal with radial compression, decreases >50% with ulnar compression Normal with radial compression, decreases >50% with ulnar compression   Findings:  Bilateral palpable pedal pulses.   Farrel Demark, RDMS, RVT 06/23/2012 5:57 PM

## 2012-06-23 NOTE — Progress Notes (Signed)
TR BAND REMOVAL  LOCATION:  right radial  DEFLATED PER PROTOCOL:  yes  TIME BAND OFF / DRESSING APPLIED:   1300   SITE UPON ARRIVAL:   Level 0  SITE AFTER BAND REMOVAL:  Level 0  REVERSE ALLEN'S TEST:    positive  CIRCULATION SENSATION AND MOVEMENT:  Within Normal Limits  yes  COMMENTS:    

## 2012-06-23 NOTE — Interval H&P Note (Signed)
History and Physical Interval Note:  06/23/2012 9:10 AM  Edward Fisher  has presented today for surgery, with the diagnosis of chest pain  The various methods of treatment have been discussed with the patient and family. After consideration of risks, benefits and other options for treatment, the patient has consented to  Procedure(s) (LRB): LEFT HEART CATHETERIZATION WITH CORONARY ANGIOGRAM (N/A) as a surgical intervention .  The patient's history has been reviewed, patient examined, no change in status, stable for surgery.  I have reviewed the patient's chart and labs.  Questions were answered to the patient's satisfaction.     [name]  06/23/2012 9:10 AM

## 2012-06-23 NOTE — Progress Notes (Signed)
ANTICOAGULATION CONSULT NOTE - Initial Consult  Pharmacy Consult for Heparin Indication: Anticoagulation while awaiting CABG vs. PCI plans  No Known Allergies  Patient Measurements: Height: 5\' 10"  (177.8 cm) Weight: 206 lb (93.441 kg) (recorded day of admission at MD's office) IBW/kg (Calculated) : 73  Heparin Dosing Weight: 91.9 kg  Vital Signs: Temp: 98.1 F (36.7 C) (08/20 0500) Temp src: Oral (08/20 0500) BP: 101/62 mmHg (08/20 0500) Pulse Rate: 72  (08/20 0911)  Labs:  Basename 06/22/12 1934 06/22/12 1820 06/22/12 1734 06/21/12 1800  HGB -- -- 16.3 15.2  HCT -- -- 48.3 44.8  PLT -- -- 189 179  APTT -- -- -- --  LABPROT 13.4 -- -- --  INR 1.00 -- -- --  HEPARINUNFRC -- -- -- --  CREATININE -- -- 1.03 1.12  CKTOTAL -- 38 -- --  CKMB -- 1.5 -- --  TROPONINI -- <0.30 -- <0.30    Estimated Creatinine Clearance: 100.7 ml/min (by C-G formula based on Cr of 1.03).   Medical History: Past Medical History  Diagnosis Date  . Hyperlipidemia     Not thought to warrant pharmacologic therapy  . Insomnia   . Angina of effort     Onset in 04/2012  . Nephrolithiasis     Medications:  Prescriptions prior to admission  Medication Sig Dispense Refill  . acetaminophen (TYLENOL) 325 MG tablet Take 650 mg by mouth every 6 (six) hours as needed. pain      . aspirin EC 81 MG tablet Take 81 mg by mouth daily.      [name] ibuprofen (ADVIL,MOTRIN) 200 MG tablet Take 200 mg by mouth every 6 (six) hours as needed. pain      . metoprolol (LOPRESSOR) 50 MG tablet Take 25 mg by mouth 2 (two) times daily.      . nitroGLYCERIN (NITROSTAT) 0.4 MG SL tablet Place 0.4 mg under the tongue every 5 (five) minutes as needed. For chest pain.      . Omega-3 Fatty Acids (FISH OIL) 500 MG CAPS Take 2 capsules by mouth daily.        . ranitidine (ZANTAC) 150 MG tablet Take 150 mg by mouth daily.        Assessment: 46 y.o. M admitted on 8/19 for cardiac cath due to recent history of chest pressure on  exertion. 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 PCI plans. The patient's sheath was removed around 1000 this a.m. Heparin dosing weight~91.9 kg, SCr: 1.03, CrCl~100 ml/min. Baseline Hgb/Hct/Plt/INR ok.   The patient was noted to receive heparin 5000 units SQ at 0544 this morning prior to cath. The patient also received a heparin 5000 unit bolus during his cath procedure around 0930.   Goal of Therapy:  Heparin level 0.3-0.7 units/ml Monitor platelets by anticoagulation protocol: Yes   Plan:  1. Initiate heparin drip at 1100 units/hr (11 ml/hr) today starting at 1800 (~8 hours after sheath removal) 2. Daily CBC, heparin levels 3. Will continue to monitor for any signs/symptoms of bleeding and will follow up with heparin level in 6 hours   Georgina Pillion, PharmD, BCPS Clinical Pharmacist Pager: [phone] 06/23/2012 11:15 AM

## 2012-06-24 ENCOUNTER — Encounter (HOSPITAL_COMMUNITY): Payer: Self-pay | Admitting: Anesthesiology

## 2012-06-24 ENCOUNTER — Inpatient Hospital Stay (HOSPITAL_COMMUNITY): Payer: Self-pay

## 2012-06-24 ENCOUNTER — Encounter (HOSPITAL_COMMUNITY)
Admission: AD | Disposition: A | Discharge status: Home / Self Care | Payer: Self-pay | Source: Ambulatory Visit | Attending: Cardiothoracic Surgery

## 2012-06-24 ENCOUNTER — Inpatient Hospital Stay (HOSPITAL_COMMUNITY): Payer: Self-pay | Admitting: Anesthesiology

## 2012-06-24 DIAGNOSIS — I2510 Atherosclerotic heart disease of native coronary artery without angina pectoris: Principal | ICD-10-CM

## 2012-06-24 DIAGNOSIS — I200 Unstable angina: Secondary | ICD-10-CM

## 2012-06-24 HISTORY — PX: CORONARY ARTERY BYPASS GRAFT: SHX141

## 2012-06-24 LAB — POCT I-STAT 3, ART BLOOD GAS (G3+)
Acid-base deficit: 2 mmol/L (ref 0.0–2.0)
Acid-base deficit: 3 mmol/L — ABNORMAL HIGH (ref 0.0–2.0)
Bicarbonate: 23 mEq/L (ref 20.0–24.0)
Bicarbonate: 22.2 mEq/L (ref 20.0–24.0)
Bicarbonate: 22.6 mEq/L (ref 20.0–24.0)
Bicarbonate: 23.9 mEq/L (ref 20.0–24.0)
O2 Saturation: 100 %
O2 Saturation: 74 %
O2 Saturation: 100 %
O2 Saturation: 98 %
TCO2: 23 mmol/L (ref 0–100)
TCO2: 24 mmol/L (ref 0–100)
TCO2: 24 mmol/L (ref 0–100)
pCO2 arterial: 39.5 mmHg (ref 35.0–45.0)
pCO2 arterial: 36.8 mmHg (ref 35.0–45.0)
pCO2 arterial: 39.2 mmHg (ref 35.0–45.0)
pCO2 arterial: 54.2 mmHg — ABNORMAL HIGH (ref 35.0–45.0)
pCO2 arterial: 44.5 mmHg (ref 35.0–45.0)
pH, Arterial: 7.352 (ref 7.350–7.450)
pH, Arterial: 7.429 (ref 7.350–7.450)
pH, Arterial: 7.299 — ABNORMAL LOW (ref 7.350–7.450)
pH, Arterial: 7.366 (ref 7.350–7.450)
pH, Arterial: 7.337 — ABNORMAL LOW (ref 7.350–7.450)
pO2, Arterial: 342 mmHg — ABNORMAL HIGH (ref 80.0–100.0)
pO2, Arterial: 174 mmHg — ABNORMAL HIGH (ref 80.0–100.0)
pO2, Arterial: 69 mmHg — ABNORMAL LOW (ref 80.0–100.0)
pO2, Arterial: 119 mmHg — ABNORMAL HIGH (ref 80.0–100.0)
pO2, Arterial: 125 mmHg — ABNORMAL HIGH (ref 80.0–100.0)
pO2, Arterial: 130 mmHg — ABNORMAL HIGH (ref 80.0–100.0)

## 2012-06-24 LAB — CBC
HCT: 47.8 % (ref 39.0–52.0)
HCT: 25.2 % — ABNORMAL LOW (ref 39.0–52.0)
Hemoglobin: 16.4 g/dL (ref 13.0–17.0)
Hemoglobin: 9.9 g/dL — ABNORMAL LOW (ref 13.0–17.0)
Hemoglobin: 8.5 g/dL — ABNORMAL LOW (ref 13.0–17.0)
MCH: 29.5 pg (ref 26.0–34.0)
MCHC: 33.6 g/dL (ref 30.0–36.0)
MCHC: 33.7 g/dL (ref 30.0–36.0)
MCV: 87.2 fL (ref 78.0–100.0)
MCV: 87.5 fL (ref 78.0–100.0)
Platelets: 125 10*3/uL — ABNORMAL LOW (ref 150–400)
RBC: 2.88 MIL/uL — ABNORMAL LOW (ref 4.22–5.81)
RDW: 12.4 % (ref 11.5–15.5)
RDW: 12.4 % (ref 11.5–15.5)
RDW: 12.5 % (ref 11.5–15.5)
WBC: 9.1 10*3/uL (ref 4.0–10.5)
WBC: 15.3 10*3/uL — ABNORMAL HIGH (ref 4.0–10.5)
WBC: 11.1 10*3/uL — ABNORMAL HIGH (ref 4.0–10.5)

## 2012-06-24 LAB — POCT I-STAT 4, (NA,K, GLUC, HGB,HCT)
Glucose, Bld: 118 mg/dL — ABNORMAL HIGH (ref 70–99)
Glucose, Bld: 93 mg/dL (ref 70–99)
HCT: 20 % — ABNORMAL LOW (ref 39.0–52.0)
HCT: 26 % — ABNORMAL LOW (ref 39.0–52.0)
Hemoglobin: 15.3 g/dL (ref 13.0–17.0)
Hemoglobin: 6.8 g/dL — CL (ref 13.0–17.0)
Hemoglobin: 8.8 g/dL — ABNORMAL LOW (ref 13.0–17.0)
Hemoglobin: 11.9 g/dL — ABNORMAL LOW (ref 13.0–17.0)
Potassium: 4.1 mEq/L (ref 3.5–5.1)
Potassium: 4.2 mEq/L (ref 3.5–5.1)
Sodium: 136 mEq/L (ref 135–145)
Sodium: 136 mEq/L (ref 135–145)
Sodium: 138 mEq/L (ref 135–145)
Sodium: 144 mEq/L (ref 135–145)

## 2012-06-24 LAB — POCT I-STAT, CHEM 8
BUN: 9 mg/dL (ref 6–23)
Calcium, Ion: 1.16 mmol/L (ref 1.12–1.23)
Chloride: 108 mEq/L (ref 96–112)
HCT: 28 % — ABNORMAL LOW (ref 39.0–52.0)
Potassium: 4.4 mEq/L (ref 3.5–5.1)
Sodium: 143 mEq/L (ref 135–145)

## 2012-06-24 LAB — PROTIME-INR
INR: 1.5 — ABNORMAL HIGH (ref 0.00–1.49)
Prothrombin Time: 18.4 seconds — ABNORMAL HIGH (ref 11.6–15.2)

## 2012-06-24 LAB — MAGNESIUM: Magnesium: 1.5 mg/dL (ref 1.5–2.5)

## 2012-06-24 LAB — CREATININE, SERUM
Creatinine, Ser: 0.4 mg/dL — ABNORMAL LOW (ref 0.50–1.35)
GFR calc Af Amer: >90 mL/min (ref >90)
GFR calc non Af Amer: >90 mL/min (ref >90)

## 2012-06-24 LAB — BASIC METABOLIC PANEL
BUN: 15 mg/dL (ref 6–23)
CO2: 22 mEq/L (ref 19–32)
GFR calc non Af Amer: 85 mL/min — ABNORMAL LOW (ref >90)
Glucose, Bld: 125 mg/dL — ABNORMAL HIGH (ref 70–99)
Potassium: 4.2 mEq/L (ref 3.5–5.1)

## 2012-06-24 LAB — PLATELET COUNT: Platelets: 173 10*3/uL (ref 150–400)

## 2012-06-24 LAB — POCT ACTIVATED CLOTTING TIME: Activated Clotting Time: 359 seconds

## 2012-06-24 LAB — GLUCOSE, CAPILLARY
Glucose-Capillary: 117 mg/dL — ABNORMAL HIGH (ref 70–99)
Glucose-Capillary: 92 mg/dL (ref 70–99)

## 2012-06-24 LAB — MRSA PCR SCREENING: MRSA by PCR: NEGATIVE

## 2012-06-24 LAB — APTT: aPTT: 30 seconds (ref 24–37)

## 2012-06-24 LAB — HEMOGLOBIN AND HEMATOCRIT, BLOOD
HCT: 25.6 % — ABNORMAL LOW (ref 39.0–52.0)
Hemoglobin: 8.7 g/dL — ABNORMAL LOW (ref 13.0–17.0)

## 2012-06-24 LAB — POCT I-STAT GLUCOSE
Glucose, Bld: 124 mg/dL — ABNORMAL HIGH (ref 70–99)
Operator id: 230421

## 2012-06-24 IMAGING — CR DG CHEST 1V PORT
1 series · 1 of 1 positions shown · non-contrast
Comparison: [DATE]

CLINICAL DATA: Status post CABG

PORTABLE CHEST - 1 VIEW

[AP]
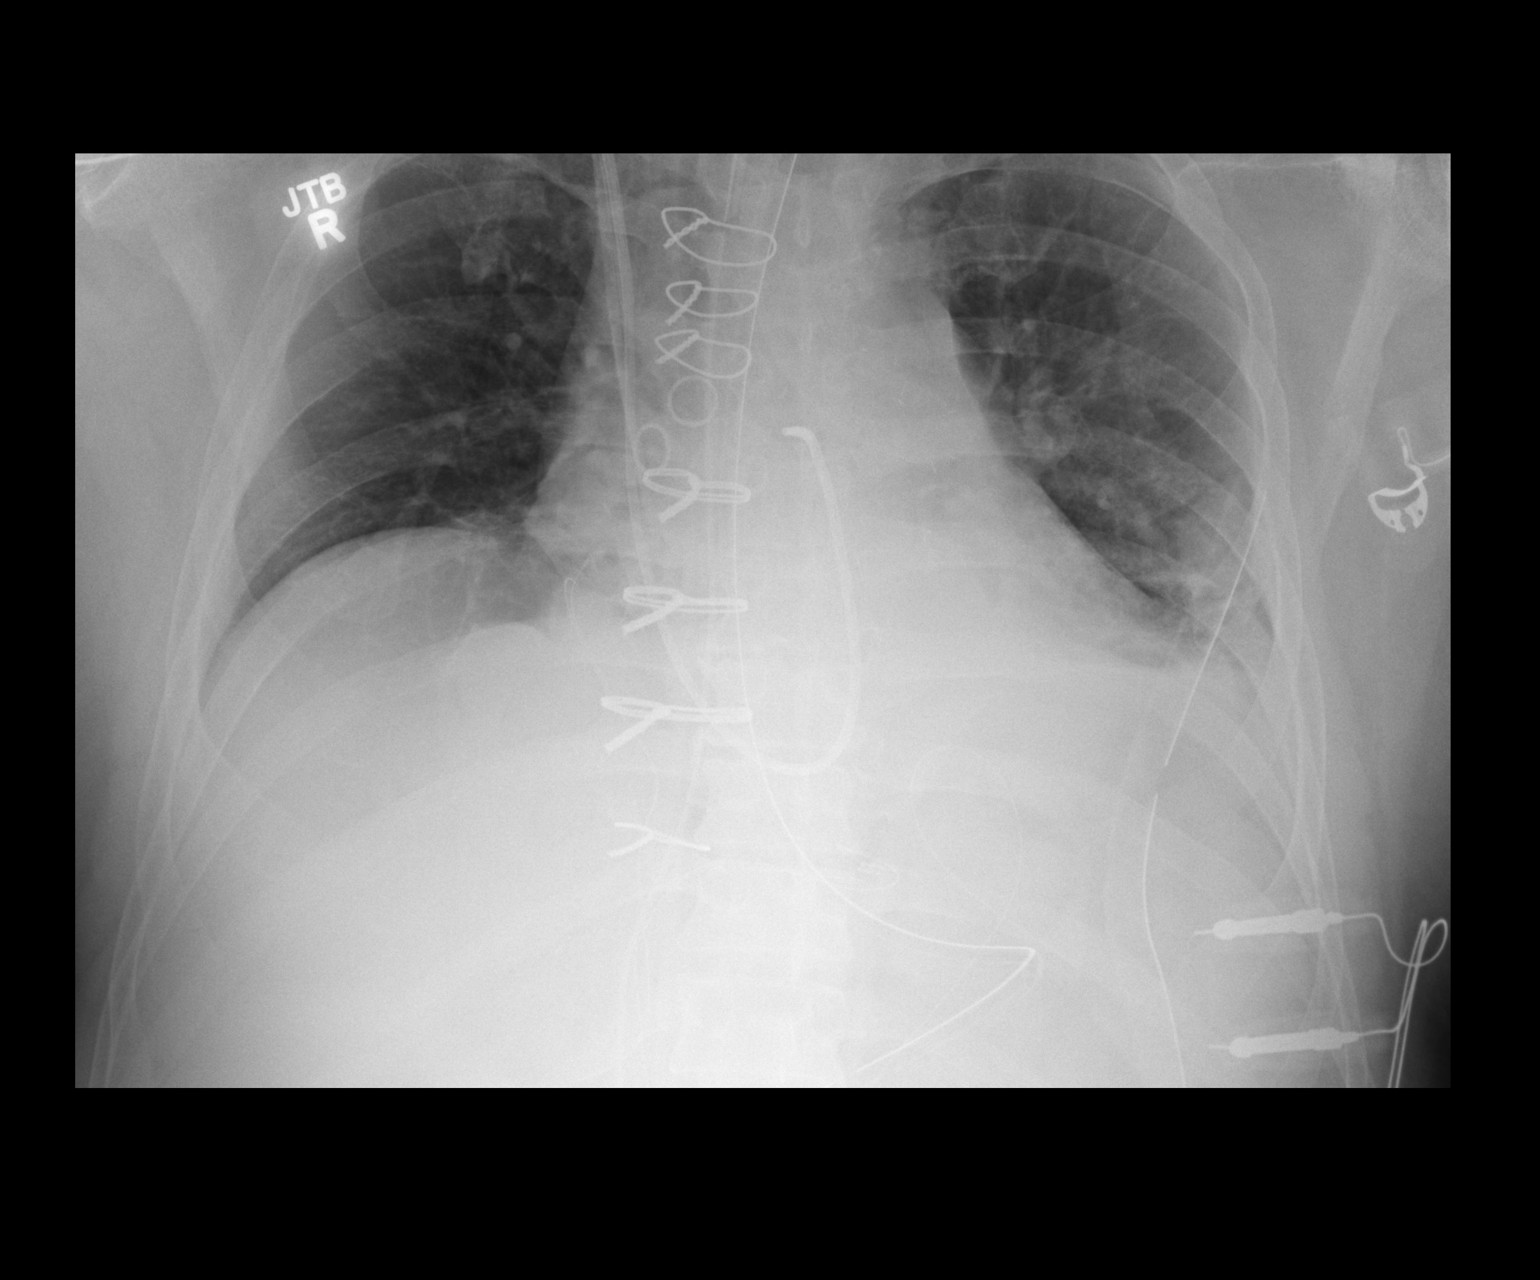

[1 of 1 positions shown; findings below may reference images not displayed]

FINDINGS: ET tube tip is above the carina.  There is a Swan-Ganz
catheter with tip in the main pulmonary artery.  Mediastinal drain
and left-sided chest tube are noted.  There is a nasogastric tube
with tip in the stomach.  Lung volumes are low.  There is a small
left effusion.  Atelectasis is noted in the left base.  No
pneumothorax present.
IMPRESSION: 1.  Small left effusion and left base atelectasis.
2.  Low lung volumes.

## 2012-06-24 SURGERY — CORONARY ARTERY BYPASS GRAFTING (CABG)
Anesthesia: General | Site: Chest | Wound class: Clean

## 2012-06-24 MED ORDER — SODIUM CHLORIDE 0.9 % IV SOLN
10.0000 mg | INTRAVENOUS | Status: DC | PRN
  Administered 2012-06-24: 40 ug/min via INTRAVENOUS

## 2012-06-24 MED ORDER — INSULIN ASPART 100 UNIT/ML ~~LOC~~ SOLN
0.0000 [IU] | SUBCUTANEOUS | Status: DC
  Administered 2012-06-25 – 2012-06-26 (×7): 2 [IU] via SUBCUTANEOUS

## 2012-06-24 MED ORDER — SODIUM CHLORIDE 0.9 % IV SOLN
INTRAVENOUS | Status: DC
  Filled 2012-06-24: qty 1

## 2012-06-24 MED ORDER — BISACODYL 10 MG RE SUPP: 10.0000 mg | Freq: Every day | RECTAL | Status: DC

## 2012-06-24 MED ORDER — ACETAMINOPHEN 160 MG/5ML PO SOLN: 650.0000 mg | ORAL | Status: AC

## 2012-06-24 MED ORDER — METOPROLOL TARTRATE 25 MG/10 ML ORAL SUSPENSION
12.5000 mg | Freq: Two times a day (BID) | ORAL | Status: DC
  Filled 2012-06-24 (×5): qty 5

## 2012-06-24 MED ORDER — SODIUM CHLORIDE 0.9 % IV SOLN: 250.0000 mL | INTRAVENOUS | Status: DC

## 2012-06-24 MED ORDER — PHENYLEPHRINE HCL 10 MG/ML IJ SOLN: 20.0000 mg | INTRAVENOUS | Status: DC | PRN

## 2012-06-24 MED ORDER — SODIUM CHLORIDE 0.9 % IJ SOLN
3.0000 mL | Freq: Two times a day (BID) | INTRAMUSCULAR | Status: DC
  Administered 2012-06-25 (×2): 3 mL via INTRAVENOUS

## 2012-06-24 MED ORDER — DOCUSATE SODIUM 100 MG PO CAPS
200.0000 mg | ORAL_CAPSULE | Freq: Every day | ORAL | Status: DC
  Administered 2012-06-25 – 2012-06-29 (×5): 200 mg via ORAL
  Filled 2012-06-24 (×6): qty 2

## 2012-06-24 MED ORDER — METOPROLOL TARTRATE 12.5 MG HALF TABLET
12.5000 mg | ORAL_TABLET | Freq: Two times a day (BID) | ORAL | Status: DC
  Administered 2012-06-24 – 2012-06-26 (×4): 12.5 mg via ORAL
  Filled 2012-06-24 (×7): qty 1

## 2012-06-24 MED ORDER — ALBUMIN HUMAN 5 % IV SOLN
250.0000 mL | INTRAVENOUS | Status: AC | PRN
  Administered 2012-06-24 (×4): 250 mL via INTRAVENOUS
  Filled 2012-06-24: qty 500

## 2012-06-24 MED ORDER — ASPIRIN EC 325 MG PO TBEC
325.0000 mg | DELAYED_RELEASE_TABLET | Freq: Every day | ORAL | Status: DC
  Administered 2012-06-25 – 2012-06-29 (×5): 325 mg via ORAL
  Filled 2012-06-24 (×5): qty 1

## 2012-06-24 MED ORDER — LACTATED RINGERS IV SOLN: 500.0000 mL | Freq: Once | INTRAVENOUS | Status: AC | PRN

## 2012-06-24 MED ORDER — DEXTROSE 5 % IV SOLN
1.5000 g | Freq: Two times a day (BID) | INTRAVENOUS | Status: DC
  Administered 2012-06-24 – 2012-06-25 (×3): 1.5 g via INTRAVENOUS
  Filled 2012-06-24 (×5): qty 1.5

## 2012-06-24 MED ORDER — FAMOTIDINE IN NACL 20-0.9 MG/50ML-% IV SOLN: 20.0000 mg | Freq: Two times a day (BID) | INTRAVENOUS | Status: DC

## 2012-06-24 MED ORDER — LACTATED RINGERS IV SOLN
INTRAVENOUS | Status: DC | PRN
  Administered 2012-06-24: 08:00:00 via INTRAVENOUS

## 2012-06-24 MED ORDER — NITROGLYCERIN IN D5W 200-5 MCG/ML-% IV SOLN: 0.0000 ug/min | INTRAVENOUS | Status: DC

## 2012-06-24 MED ORDER — DEXMEDETOMIDINE HCL IN NACL 200 MCG/50ML IV SOLN
0.1000 ug/kg/h | INTRAVENOUS | Status: DC
  Filled 2012-06-24: qty 50

## 2012-06-24 MED ORDER — MAGNESIUM SULFATE 40 MG/ML IJ SOLN
4.0000 g | Freq: Once | INTRAMUSCULAR | Status: AC
  Administered 2012-06-24: 4 g via INTRAVENOUS
  Filled 2012-06-24: qty 100

## 2012-06-24 MED ORDER — ONDANSETRON HCL 4 MG/2ML IJ SOLN: 4.0000 mg | Freq: Four times a day (QID) | INTRAMUSCULAR | Status: DC | PRN

## 2012-06-24 MED ORDER — SODIUM CHLORIDE 0.9 % IJ SOLN
OROMUCOSAL | Status: DC | PRN
  Administered 2012-06-24: 11:00:00 via TOPICAL

## 2012-06-24 MED ORDER — FENTANYL CITRATE 0.05 MG/ML IJ SOLN
INTRAMUSCULAR | Status: DC | PRN
  Administered 2012-06-24: 1400 ug via INTRAVENOUS
  Administered 2012-06-24: 500 ug via INTRAVENOUS
  Administered 2012-06-24: 100 ug via INTRAVENOUS

## 2012-06-24 MED ORDER — ALBUMIN HUMAN 5 % IV SOLN
INTRAVENOUS | Status: DC | PRN
  Administered 2012-06-24: 15:00:00 via INTRAVENOUS

## 2012-06-24 MED ORDER — ROCURONIUM BROMIDE 100 MG/10ML IV SOLN
INTRAVENOUS | Status: DC | PRN
  Administered 2012-06-24 (×2): 50 mg via INTRAVENOUS
  Administered 2012-06-24: 30 mg via INTRAVENOUS
  Administered 2012-06-24 (×2): 50 mg via INTRAVENOUS
  Administered 2012-06-24: 20 mg via INTRAVENOUS

## 2012-06-24 MED ORDER — METOPROLOL TARTRATE 1 MG/ML IV SOLN: 2.5000 mg | INTRAVENOUS | Status: DC | PRN

## 2012-06-24 MED ORDER — PANTOPRAZOLE SODIUM 40 MG PO TBEC: 40.0000 mg | DELAYED_RELEASE_TABLET | Freq: Every day | ORAL | Status: DC

## 2012-06-24 MED ORDER — MIDAZOLAM HCL 5 MG/5ML IJ SOLN
INTRAMUSCULAR | Status: DC | PRN
  Administered 2012-06-24: 2 mg via INTRAVENOUS
  Administered 2012-06-24: 3 mg via INTRAVENOUS
  Administered 2012-06-24: 2 mg via INTRAVENOUS
  Administered 2012-06-24: 3 mg via INTRAVENOUS
  Administered 2012-06-24: 4 mg via INTRAVENOUS
  Administered 2012-06-24: 3 mg via INTRAVENOUS

## 2012-06-24 MED ORDER — DEXMEDETOMIDINE HCL IN NACL 200 MCG/50ML IV SOLN
0.4000 ug/kg/h | INTRAVENOUS | Status: DC
  Filled 2012-06-24: qty 50

## 2012-06-24 MED ORDER — ACETAMINOPHEN 160 MG/5ML PO SOLN: 975.0000 mg | Freq: Four times a day (QID) | ORAL | Status: DC

## 2012-06-24 MED ORDER — POTASSIUM CHLORIDE 10 MEQ/50ML IV SOLN
10.0000 meq | INTRAVENOUS | Status: AC
  Administered 2012-06-24 (×2): 10 meq via INTRAVENOUS

## 2012-06-24 MED ORDER — MORPHINE SULFATE 2 MG/ML IJ SOLN
2.0000 mg | INTRAMUSCULAR | Status: DC | PRN
  Administered 2012-06-24 – 2012-06-25 (×6): 2 mg via INTRAVENOUS
  Filled 2012-06-24: qty 2
  Filled 2012-06-24 (×6): qty 1

## 2012-06-24 MED ORDER — INSULIN REGULAR BOLUS VIA INFUSION
0.0000 [IU] | Freq: Three times a day (TID) | INTRAVENOUS | Status: DC
  Filled 2012-06-24: qty 10

## 2012-06-24 MED ORDER — TRANEXAMIC ACID 100 MG/ML IV SOLN
2500.0000 mg | INTRAVENOUS | Status: DC | PRN
  Administered 2012-06-24: 1.5 mg/kg/h via INTRAVENOUS

## 2012-06-24 MED ORDER — PROPOFOL 10 MG/ML IV BOLUS
INTRAVENOUS | Status: DC | PRN
  Administered 2012-06-24: 100 mg via INTRAVENOUS

## 2012-06-24 MED ORDER — LACTATED RINGERS IV SOLN
INTRAVENOUS | Status: DC | PRN
  Administered 2012-06-24 (×2): via INTRAVENOUS

## 2012-06-24 MED ORDER — LACTATED RINGERS IV SOLN
INTRAVENOUS | Status: DC
  Administered 2012-06-24: 500 mL via INTRAVENOUS

## 2012-06-24 MED ORDER — NITROGLYCERIN IN D5W 200-5 MCG/ML-% IV SOLN
INTRAVENOUS | Status: DC | PRN
  Administered 2012-06-24: 16.6 ug/min via INTRAVENOUS

## 2012-06-24 MED ORDER — VANCOMYCIN HCL IN DEXTROSE 1-5 GM/200ML-% IV SOLN
1000.0000 mg | Freq: Once | INTRAVENOUS | Status: AC
  Administered 2012-06-24: 1000 mg via INTRAVENOUS
  Filled 2012-06-24 (×2): qty 200

## 2012-06-24 MED ORDER — MIDAZOLAM HCL 2 MG/2ML IJ SOLN: 2.0000 mg | INTRAMUSCULAR | Status: DC | PRN

## 2012-06-24 MED ORDER — SODIUM CHLORIDE 0.45 % IV SOLN
INTRAVENOUS | Status: DC
  Administered 2012-06-24: 20 mL via INTRAVENOUS

## 2012-06-24 MED ORDER — ASPIRIN 81 MG PO CHEW
324.0000 mg | CHEWABLE_TABLET | Freq: Every day | ORAL | Status: DC
  Filled 2012-06-24: qty 1

## 2012-06-24 MED ORDER — HEPARIN SODIUM (PORCINE) 1000 UNIT/ML IJ SOLN
INTRAMUSCULAR | Status: DC | PRN
  Administered 2012-06-24: 29 mL via INTRAVENOUS

## 2012-06-24 MED ORDER — BISACODYL 5 MG PO TBEC
10.0000 mg | DELAYED_RELEASE_TABLET | Freq: Every day | ORAL | Status: DC
  Administered 2012-06-25 – 2012-06-26 (×2): 10 mg via ORAL
  Filled 2012-06-24 (×2): qty 2

## 2012-06-24 MED ORDER — SODIUM CHLORIDE 0.9 % IJ SOLN: 3.0000 mL | INTRAMUSCULAR | Status: DC | PRN

## 2012-06-24 MED ORDER — DOPAMINE-DEXTROSE 3.2-5 MG/ML-% IV SOLN
INTRAVENOUS | Status: DC | PRN
  Administered 2012-06-24: 3 ug/kg/min via INTRAVENOUS

## 2012-06-24 MED ORDER — PROTAMINE SULFATE 10 MG/ML IV SOLN
INTRAVENOUS | Status: DC | PRN
  Administered 2012-06-24: 270 mg via INTRAVENOUS

## 2012-06-24 MED ORDER — INSULIN REGULAR HUMAN 100 UNIT/ML IJ SOLN
100.0000 [IU] | INTRAMUSCULAR | Status: DC | PRN
  Administered 2012-06-24: 1.9 [IU]/h via INTRAVENOUS

## 2012-06-24 MED ORDER — SODIUM CHLORIDE 0.9 % IV SOLN: INTRAVENOUS | Status: DC

## 2012-06-24 MED ORDER — ACETAMINOPHEN 500 MG PO TABS
1000.0000 mg | ORAL_TABLET | Freq: Four times a day (QID) | ORAL | Status: DC
  Administered 2012-06-24 – 2012-06-27 (×10): 1000 mg via ORAL
  Filled 2012-06-24 (×20): qty 2

## 2012-06-24 MED ORDER — MORPHINE SULFATE 2 MG/ML IJ SOLN
1.0000 mg | INTRAMUSCULAR | Status: AC | PRN
  Filled 2012-06-24: qty 1

## 2012-06-24 MED ORDER — INSULIN ASPART 100 UNIT/ML ~~LOC~~ SOLN
0.0000 [IU] | SUBCUTANEOUS | Status: AC
  Administered 2012-06-24 – 2012-06-25 (×3): 2 [IU] via SUBCUTANEOUS

## 2012-06-24 MED ORDER — PHENYLEPHRINE HCL 10 MG/ML IJ SOLN
0.0000 ug/min | INTRAVENOUS | Status: DC
  Administered 2012-06-25: 45 ug/min via INTRAVENOUS
  Administered 2012-06-26: 15 ug/min via INTRAVENOUS
  Filled 2012-06-24 (×6): qty 2

## 2012-06-24 MED ORDER — OXYCODONE HCL 5 MG PO TABS
5.0000 mg | ORAL_TABLET | ORAL | Status: DC | PRN
  Administered 2012-06-25: 10 mg via ORAL
  Administered 2012-06-25 (×2): 5 mg via ORAL
  Administered 2012-06-26: 10 mg via ORAL
  Filled 2012-06-24: qty 2
  Filled 2012-06-24 (×2): qty 1
  Filled 2012-06-24: qty 2

## 2012-06-24 MED ORDER — ACETAMINOPHEN 650 MG RE SUPP
650.0000 mg | RECTAL | Status: AC
  Administered 2012-06-24: 650 mg via RECTAL

## 2012-06-24 MED ORDER — SODIUM CHLORIDE 0.9 % IV SOLN
INTRAVENOUS | Status: DC | PRN
  Administered 2012-06-24: 15:00:00 via INTRAVENOUS

## 2012-06-24 SURGICAL SUPPLY — 128 items
APPLIER CLIP 9.375 SM OPEN (CLIP)
APR CLP SM 9.3 20 MLT OPN (CLIP)
ATTRACTOMAT 16X20 MAGNETIC DRP (DRAPES) ×3 IMPLANT
BAG DECANTER FOR FLEXI CONT (MISCELLANEOUS) ×3 IMPLANT
BANDAGE ELASTIC 4 VELCRO ST LF (GAUZE/BANDAGES/DRESSINGS) ×3 IMPLANT
BANDAGE ELASTIC 6 VELCRO ST LF (GAUZE/BANDAGES/DRESSINGS) ×3 IMPLANT
BANDAGE GAUZE ELAST BULKY 4 IN (GAUZE/BANDAGES/DRESSINGS) ×3 IMPLANT
BLADE STERNUM SYSTEM 6 (BLADE) ×3 IMPLANT
BLADE SURG 15 STRL LF DISP TIS (BLADE) ×1 IMPLANT
BLADE SURG 15 STRL SS (BLADE) ×3
BLADE SURG ROTATE 9660 (MISCELLANEOUS) IMPLANT
CANISTER SUCTION 2500CC (MISCELLANEOUS) ×3 IMPLANT
CANN PRFSN .5XCNCT 15X34-48 (MISCELLANEOUS) ×2
CANNULA AORTIC HI-FLOW 6.5M20F (CANNULA) ×3 IMPLANT
CANNULA PRFSN .5XCNCT 15X34-48 (MISCELLANEOUS) ×2 IMPLANT
CANNULA VEN 2 STAGE (MISCELLANEOUS) ×3
CANNULA VESSEL W/WING WO/VALVE (CANNULA) ×6 IMPLANT
CATH CPB KIT GERHARDT (MISCELLANEOUS) ×3 IMPLANT
CATH THORACIC 28FR (CATHETERS) ×3 IMPLANT
CATH THORACIC 36FR (CATHETERS) IMPLANT
CATH THORACIC 36FR RT ANG (CATHETERS) IMPLANT
CLIP APPLIE 9.375 SM OPEN (CLIP) IMPLANT
CLIP RETRACTION 3.0MM CORONARY (MISCELLANEOUS) ×2 IMPLANT
CLIP TI MEDIUM 24 (CLIP) IMPLANT
CLIP TI WIDE RED SMALL 24 (CLIP) ×4 IMPLANT
CLOTH BEACON ORANGE TIMEOUT ST (SAFETY) ×4 IMPLANT
COVER MAYO STAND STRL (DRAPES) ×2 IMPLANT
COVER SURGICAL LIGHT HANDLE (MISCELLANEOUS) ×6 IMPLANT
CRADLE DONUT ADULT HEAD (MISCELLANEOUS) ×3 IMPLANT
DRAIN CHANNEL 28F RND 3/8 FF (WOUND CARE) ×3 IMPLANT
DRAIN CHANNEL 32F RND 10.7 FF (WOUND CARE) IMPLANT
DRAPE CARDIOVASCULAR INCISE (DRAPES) ×3
DRAPE EXTREMITY T 121X128X90 (DRAPE) ×2 IMPLANT
DRAPE PROXIMA HALF (DRAPES) ×2 IMPLANT
DRAPE SLUSH MACHINE 52X66 (DRAPES) IMPLANT
DRAPE SLUSH/WARMER DISC (DRAPES) ×2 IMPLANT
DRAPE SRG 135X102X78XABS (DRAPES) ×2 IMPLANT
DRSG COVADERM 4X14 (GAUZE/BANDAGES/DRESSINGS) ×3 IMPLANT
ELECT BLADE 4.0 EZ CLEAN MEGAD (MISCELLANEOUS) ×3
ELECT CAUTERY BLADE 6.4 (BLADE) ×3 IMPLANT
ELECT REM PT RETURN 9FT ADLT (ELECTROSURGICAL) ×6
ELECTRODE BLDE 4.0 EZ CLN MEGD (MISCELLANEOUS) ×2 IMPLANT
ELECTRODE REM PT RTRN 9FT ADLT (ELECTROSURGICAL) ×4 IMPLANT
GEL ULTRASOUND 20GR AQUASONIC (MISCELLANEOUS) ×2 IMPLANT
GLOVE BIO SURGEON STRL SZ 6 (GLOVE) ×6 IMPLANT
GLOVE BIO SURGEON STRL SZ 6.5 (GLOVE) ×11 IMPLANT
GLOVE BIO SURGEON STRL SZ7 (GLOVE) ×8 IMPLANT
GLOVE BIO SURGEON STRL SZ7.5 (GLOVE) IMPLANT
GLOVE BIOGEL PI IND STRL 6 (GLOVE) IMPLANT
GLOVE BIOGEL PI IND STRL 6.5 (GLOVE) IMPLANT
GLOVE BIOGEL PI IND STRL 7.0 (GLOVE) IMPLANT
GLOVE BIOGEL PI INDICATOR 6 (GLOVE)
GLOVE BIOGEL PI INDICATOR 6.5 (GLOVE)
GLOVE BIOGEL PI INDICATOR 7.0 (GLOVE)
GLOVE EUDERMIC 7 POWDERFREE (GLOVE) IMPLANT
GLOVE ORTHO TXT STRL SZ7.5 (GLOVE) IMPLANT
GOWN BRE IMP SLV AUR LG STRL (GOWN DISPOSABLE) ×2 IMPLANT
GOWN STRL NON-REIN LRG LVL3 (GOWN DISPOSABLE) ×20 IMPLANT
HARMONIC SHEARS 14CM COAG (MISCELLANEOUS) ×3 IMPLANT
HEMOSTAT POWDER SURGIFOAM 1G (HEMOSTASIS) ×9 IMPLANT
HEMOSTAT SURGICEL 2X14 (HEMOSTASIS) ×3 IMPLANT
INSERT FOGARTY 61MM (MISCELLANEOUS) IMPLANT
INSERT FOGARTY XLG (MISCELLANEOUS) IMPLANT
KIT BASIN OR (CUSTOM PROCEDURE TRAY) ×3 IMPLANT
KIT ROOM TURNOVER OR (KITS) ×6 IMPLANT
KIT SUCTION CATH 14FR (SUCTIONS) ×6 IMPLANT
KIT VASOVIEW W/TROCAR VH 2000 (KITS) ×3 IMPLANT
LEAD PACING MYOCARDI (MISCELLANEOUS) ×3 IMPLANT
MARKER GRAFT CORONARY BYPASS (MISCELLANEOUS) ×9 IMPLANT
NS IRRIG 1000ML POUR BTL (IV SOLUTION) ×15 IMPLANT
PACK OPEN HEART (CUSTOM PROCEDURE TRAY) ×3 IMPLANT
PAD ARMBOARD 7.5X6 YLW CONV (MISCELLANEOUS) ×12 IMPLANT
PENCIL BUTTON HOLSTER BLD 10FT (ELECTRODE) ×3 IMPLANT
PUNCH AORTIC ROTATE 4.0MM (MISCELLANEOUS) ×2 IMPLANT
PUNCH AORTIC ROTATE 4.5MM 8IN (MISCELLANEOUS) IMPLANT
PUNCH AORTIC ROTATE 5MM 8IN (MISCELLANEOUS) IMPLANT
SET CARDIOPLEGIA MPS 5001102 (MISCELLANEOUS) ×2 IMPLANT
SOLUTION ANTI FOG 6CC (MISCELLANEOUS) IMPLANT
SPONGE GAUZE 4X4 12PLY (GAUZE/BANDAGES/DRESSINGS) ×6 IMPLANT
SPONGE LAP 18X18 X RAY DECT (DISPOSABLE) ×8 IMPLANT
SPONGE LAP 4X18 X RAY DECT (DISPOSABLE) IMPLANT
SUT BONE WAX W31G (SUTURE) ×3 IMPLANT
SUT ETHIBOND 2 0 SH (SUTURE) ×3
SUT ETHIBOND 2 0 SH 36X2 (SUTURE) ×1 IMPLANT
SUT ETHIBOND NAB MH 2-0 36IN (SUTURE) ×2 IMPLANT
SUT MNCRL AB 4-0 PS2 18 (SUTURE) ×2 IMPLANT
SUT PROLENE 3 0 SH DA (SUTURE) IMPLANT
SUT PROLENE 3 0 SH1 36 (SUTURE) ×3 IMPLANT
SUT PROLENE 4 0 RB 1 (SUTURE)
SUT PROLENE 4 0 SH DA (SUTURE) IMPLANT
SUT PROLENE 4 0 TF (SUTURE) ×6 IMPLANT
SUT PROLENE 4-0 RB1 .5 CRCL 36 (SUTURE) IMPLANT
SUT PROLENE 5 0 C 1 36 (SUTURE) IMPLANT
SUT PROLENE 6 0 C 1 30 (SUTURE) ×2 IMPLANT
SUT PROLENE 6 0 CC (SUTURE) ×8 IMPLANT
SUT PROLENE 7 0 BV 1 (SUTURE) IMPLANT
SUT PROLENE 7 0 BV1 MDA (SUTURE) ×3 IMPLANT
SUT PROLENE 7.0 RB 3 (SUTURE) ×4 IMPLANT
SUT PROLENE 8 0 BV175 6 (SUTURE) ×4 IMPLANT
SUT SILK  1 MH (SUTURE)
SUT SILK 1 MH (SUTURE) IMPLANT
SUT SILK 2 0 SH CR/8 (SUTURE) IMPLANT
SUT SILK 3 0 SH CR/8 (SUTURE) IMPLANT
SUT STEEL 6MS V (SUTURE) ×3 IMPLANT
SUT STEEL STERNAL CCS#1 18IN (SUTURE) IMPLANT
SUT STEEL SZ 6 DBL 3X14 BALL (SUTURE) ×3 IMPLANT
SUT VIC AB 1 CTX 18 (SUTURE) ×6 IMPLANT
SUT VIC AB 1 CTX 36 (SUTURE)
SUT VIC AB 1 CTX36XBRD ANBCTR (SUTURE) IMPLANT
SUT VIC AB 2-0 CT1 27 (SUTURE) ×6
SUT VIC AB 2-0 CT1 TAPERPNT 27 (SUTURE) ×2 IMPLANT
SUT VIC AB 2-0 CTX 27 (SUTURE) IMPLANT
SUT VIC AB 3-0 SH 27 (SUTURE) ×6
SUT VIC AB 3-0 SH 27X BRD (SUTURE) ×2 IMPLANT
SUT VIC AB 3-0 X1 27 (SUTURE) IMPLANT
SUT VICRYL 4-0 PS2 18IN ABS (SUTURE) ×4 IMPLANT
SUTURE E-PAK OPEN HEART (SUTURE) ×3 IMPLANT
SYR 50ML SLIP (SYRINGE) ×2 IMPLANT
SYSTEM SAHARA CHEST DRAIN ATS (WOUND CARE) ×3 IMPLANT
TAPE CLOTH SURG 4X10 WHT LF (GAUZE/BANDAGES/DRESSINGS) ×4 IMPLANT
TOWEL OR 17X24 6PK STRL BLUE (TOWEL DISPOSABLE) ×12 IMPLANT
TOWEL OR 17X26 10 PK STRL BLUE (TOWEL DISPOSABLE) ×6 IMPLANT
TRAY FOLEY IC TEMP SENS 14FR (CATHETERS) ×3 IMPLANT
TUBE FEEDING 8FR 16IN STR KANG (MISCELLANEOUS) ×5 IMPLANT
TUBE SUCT INTRACARD DLP 20F (MISCELLANEOUS) ×3 IMPLANT
TUBING INSUFFLATION 10FT LAP (TUBING) ×3 IMPLANT
UNDERPAD 30X30 INCONTINENT (UNDERPADS AND DIAPERS) ×3 IMPLANT
WATER STERILE IRR 1000ML POUR (IV SOLUTION) ×6 IMPLANT

## 2012-06-24 NOTE — Progress Notes (Signed)
S/p CABG x 4  Waking up  BP 83/53  Pulse 77  Temp 95.7 F (35.4 C) (Oral)  Resp 18  Ht 5\' 10"  (1.778 m)  Wt 207 lb 10.8 oz (94.2 kg)  BMI 29.80 kg/m2  SpO2 99%   Intake/Output Summary (Last 24 hours) at 06/24/12 1833 Last data filed at 06/24/12 1800  Gross per 24 hour  Intake 5162.41 ml  Output   3900 ml  Net 1262.41 ml    Ready for extubation if ABG OK

## 2012-06-24 NOTE — Progress Notes (Signed)
ANTICOAGULATION CONSULT NOTE - Follow-Up Consult  Pharmacy Consult for Heparin Indication: Anticoagulation while awaiting CABG   No Known Allergies  Patient Measurements: Height: 5\' 10"  (177.8 cm) Weight: 206 lb (93.441 kg) (recorded day of admission at MD's office) IBW/kg (Calculated) : 73  Heparin Dosing Weight: 91.9 kg  Vital Signs: Temp: 98.3 F (36.8 C) (08/20 2000) Temp src: Oral (08/20 2000) BP: 128/73 mmHg (08/20 2213) Pulse Rate: 81  (08/20 2213)  Labs:  Basename 06/23/12 2334 06/23/12 1506 06/22/12 1934 06/22/12 1820 06/22/12 1734 06/21/12 1800  HGB -- -- -- -- 16.3 15.2  HCT -- -- -- -- 48.3 44.8  PLT -- -- -- -- 189 179  APTT -- -- -- -- -- --  LABPROT -- -- 13.4 -- -- --  INR -- -- 1.00 -- -- --  HEPARINUNFRC 0.15* -- -- -- -- --  CREATININE -- 0.91 -- -- 1.03 1.12  CKTOTAL -- -- -- 38 -- --  CKMB -- -- -- 1.5 -- --  TROPONINI -- -- -- <0.30 -- <0.30    Estimated Creatinine Clearance: 114 ml/min (by C-G formula based on Cr of 0.91).   Medical History: Past Medical History  Diagnosis Date  . Hyperlipidemia     Not thought to warrant pharmacologic therapy  . Insomnia   . Angina of effort     Onset in 04/2012  . Nephrolithiasis     Medications:  Prescriptions prior to admission  Medication Sig Dispense Refill  . acetaminophen (TYLENOL) 325 MG tablet Take 650 mg by mouth every 6 (six) hours as needed. pain      . aspirin EC 81 MG tablet Take 81 mg by mouth daily.      [name] ibuprofen (ADVIL,MOTRIN) 200 MG tablet Take 200 mg by mouth every 6 (six) hours as needed. pain      . metoprolol (LOPRESSOR) 50 MG tablet Take 25 mg by mouth 2 (two) times daily.      . nitroGLYCERIN (NITROSTAT) 0.4 MG SL tablet Place 0.4 mg under the tongue every 5 (five) minutes as needed. For chest pain.      . Omega-3 Fatty Acids (FISH OIL) 500 MG CAPS Take 2 capsules by mouth daily.        . ranitidine (ZANTAC) 150 MG tablet Take 150 mg by mouth daily.        Assessment: 46  y.o. M admitted on 8/19 for cardiac cath due to recent history of chest pressure on exertion. Heparin restarted post-cath. Plan for CABG 06/24/12. Heparin level (0.15) is below-goal on 1100 units/hr. No problem with line / infusion per RN.   Goal of Therapy:  Heparin level 0.3-0.7 units/ml Monitor platelets by anticoagulation protocol: Yes   Plan:  1. Increase IV heparin to 1400 units/hr  2. Follow-up with physician regarding heparin discontinuation for CABG.  Lorre Munroe, PharmD 06/24/2012 12:02 AM

## 2012-06-24 NOTE — Preoperative (Signed)
Beta Blockers   Reason not to administer Beta Blockers:Not Applicable 

## 2012-06-24 NOTE — Anesthesia Procedure Notes (Signed)
Procedure Name: Intubation Date/Time: 06/24/2012 9:24 AM Performed by: Rogelia Boga Pre-anesthesia Checklist: Patient identified, Emergency Drugs available, Suction available, Patient being monitored and Timeout performed Patient Re-evaluated:Patient Re-evaluated prior to inductionOxygen Delivery Method: Circle system utilized Preoxygenation: Pre-oxygenation with 100% oxygen Intubation Type: IV induction Ventilation: Mask ventilation without difficulty Laryngoscope Size: Mac and 4 Grade View: Grade II Tube type: Oral Tube size: 8.0 mm Number of attempts: 1 Airway Equipment and Method: Stylet Placement Confirmation: ETT inserted through vocal cords under direct vision,  positive ETCO2 and breath sounds checked- equal and bilateral Secured at: 22 cm Tube secured with: Tape Dental Injury: Teeth and Oropharynx as per pre-operative assessment

## 2012-06-24 NOTE — Anesthesia Postprocedure Evaluation (Signed)
Anesthesia Post Note  Patient: Edward Fisher  Procedure(s) Performed: Procedure(s) (LRB): CORONARY ARTERY BYPASS GRAFTING (CABG) (N/A)  Anesthesia type: General  Patient location: ICU  Post pain: Pain level controlled  Post assessment: Post-op Vital signs reviewed  Last Vitals:  Filed Vitals:   06/24/12 1645  BP:   Pulse: 85  Temp: 35.4 C  Resp: 12    Post vital signs: stable  Level of consciousness: Patient remains intubated per anesthesia plan  Complications: No apparent anesthesia complications

## 2012-06-24 NOTE — Brief Op Note (Addendum)
06/22/2012 - 06/24/2012  1:48 PM  PATIENT:  Edward Fisher  46 y.o. male  PRE-OPERATIVE DIAGNOSIS:  CAD  POST-OPERATIVE DIAGNOSIS:  CAD  PROCEDURE:  Procedure(s) (LRB):  CORONARY ARTERY BYPASS GRAFTING x4  LIMA TO LAD  RADIAL ARTERY TO  CIRCUMFLEX SEQUENTIAL SVG TO DISTAL RCA AND DISTAL PDA  Left RADIAL ARTERY HARVEST   ENDOSCOPIC SAPHENOUS VEIN HARVEST  SURGEON:  Surgeon(s) and Role: Panel 1:    * Delight Ovens, MD - Primary  Panel 2:    * Delight Ovens, MD - Primary  PHYSICIAN ASSISTANT: Lowella Dandy PA-C  ANESTHESIA:   general  EBL:  Total I/O In: 1900 [I.V.:1900] Out: 1100 [Urine:1100]  BLOOD ADMINISTERED: CC CELLSAVER  DRAINS: Left plerual chest tube, medistinal chest drains   LOCAL MEDICATIONS USED:  NONE  SPECIMEN:  No Specimen  DISPOSITION OF SPECIMEN:  N/A  COUNTS:  YES   DICTATION: .Dragon Dictation  PLAN OF CARE: Admit to inpatient   PATIENT DISPOSITION:  ICU - intubated and hemodynamically stable.   Delay start of Pharmacological VTE agent (>24hrs) due to surgical blood loss or risk of bleeding: yes

## 2012-06-24 NOTE — Anesthesia Preprocedure Evaluation (Addendum)
Anesthesia Evaluation  Patient identified by MRN, date of birth, ID band Patient awake    Reviewed: Allergy & Precautions, H&P , NPO status , Patient's Chart, lab work & pertinent test results  Airway Mallampati: II TM Distance: >3 FB Neck ROM: Full    Dental  (+) Dental Advisory Given   Pulmonary          Cardiovascular + angina with exertion + CAD     Neuro/Psych    GI/Hepatic   Endo/Other    Renal/GU Renal diseaseNephrolithasis     Musculoskeletal   Abdominal   Peds  Hematology   Anesthesia Other Findings   Reproductive/Obstetrics                           Anesthesia Physical Anesthesia Plan  ASA: III  Anesthesia Plan: General   Post-op Pain Management:    Induction: Intravenous  Airway Management Planned: Oral ETT  Additional Equipment: Arterial line, CVP, PA Cath and Ultrasound Guidance Line Placement  Intra-op Plan:   Post-operative Plan: Post-operative intubation/ventilation  Informed Consent: I have reviewed the patients History and Physical, chart, labs and discussed the procedure including the risks, benefits and alternatives for the proposed anesthesia with the patient or authorized representative who has indicated his/her understanding and acceptance.   Dental advisory given  Plan Discussed with: CRNA and Surgeon  Anesthesia Plan Comments:        Anesthesia Quick Evaluation

## 2012-06-24 NOTE — Transfer of Care (Addendum)
Immediate Anesthesia Transfer of Care Note  Patient: Edward Fisher  Procedure(s) Performed: Procedure(s) (LRB): CORONARY ARTERY BYPASS GRAFTING (CABG) (N/A)  Patient Location: SICU  Anesthesia Type: General  Level of Consciousness: sedated, unresponsive and Patient remains intubated per anesthesia plan  Airway & Oxygen Therapy: Patient remains intubated per anesthesia plan and Patient placed on Ventilator (see vital sign flow sheet for setting)  Post-op Assessment: Report given to PACU RN and Post -op Vital signs reviewed and stable  Post vital signs: Reviewed and stable  Complications: No apparent anesthesia complications

## 2012-06-24 NOTE — Progress Notes (Signed)
Agree as outlined. See cath note for details.  [name] 06/24/2012 5:39 PM

## 2012-06-24 NOTE — Procedures (Signed)
Extubation Procedure Note  Patient Details:   Name: Edward Fisher DOB: [birth year] MRN: [id number]   Airway Documentation:     Evaluation  O2 sats: stable throughout and currently acceptable Complications: No apparent complications Patient did tolerate procedure well. Bilateral Breath Sounds: Clear   Yes Pt awake and alert, extubated per protocol, placed on 3L [place], sat 100%. NIF-20, VC 1200, IS 750, positive cuff leak, BBS cl. Pt able to vocalize.  Arloa Koh 06/24/2012, 7:02 PM

## 2012-06-24 NOTE — Progress Notes (Signed)
  Echocardiogram Echocardiogram Transesophageal has been performed.  Edward Fisher 06/24/2012, 10:43 AM

## 2012-06-25 ENCOUNTER — Encounter (HOSPITAL_COMMUNITY): Payer: Self-pay | Admitting: Cardiothoracic Surgery

## 2012-06-25 ENCOUNTER — Inpatient Hospital Stay (HOSPITAL_COMMUNITY): Payer: Self-pay

## 2012-06-25 LAB — MAGNESIUM: Magnesium: 2.4 mg/dL (ref 1.5–2.5)

## 2012-06-25 LAB — GLUCOSE, CAPILLARY
Glucose-Capillary: 126 mg/dL — ABNORMAL HIGH (ref 70–99)
Glucose-Capillary: 131 mg/dL — ABNORMAL HIGH (ref 70–99)

## 2012-06-25 LAB — CREATININE, SERUM
Creatinine, Ser: 0.91 mg/dL (ref 0.50–1.35)
GFR calc Af Amer: >90 mL/min (ref >90)
GFR calc non Af Amer: >90 mL/min (ref >90)

## 2012-06-25 LAB — CBC
HCT: 29.1 % — ABNORMAL LOW (ref 39.0–52.0)
HCT: 27.2 % — ABNORMAL LOW (ref 39.0–52.0)
Hemoglobin: 9.8 g/dL — ABNORMAL LOW (ref 13.0–17.0)
Hemoglobin: 9.2 g/dL — ABNORMAL LOW (ref 13.0–17.0)
MCHC: 33.8 g/dL (ref 30.0–36.0)
WBC: 12.6 10*3/uL — ABNORMAL HIGH (ref 4.0–10.5)
WBC: 12.8 10*3/uL — ABNORMAL HIGH (ref 4.0–10.5)

## 2012-06-25 LAB — BASIC METABOLIC PANEL
BUN: 12 mg/dL (ref 6–23)
CO2: 24 mEq/L (ref 19–32)
Chloride: 108 mEq/L (ref 96–112)
Glucose, Bld: 142 mg/dL — ABNORMAL HIGH (ref 70–99)
Potassium: 4.1 mEq/L (ref 3.5–5.1)

## 2012-06-25 LAB — POCT I-STAT, CHEM 8
Calcium, Ion: 1.24 mmol/L — ABNORMAL HIGH (ref 1.12–1.23)
Chloride: 102 mEq/L (ref 96–112)
Creatinine, Ser: 1 mg/dL (ref 0.50–1.35)
Glucose, Bld: 147 mg/dL — ABNORMAL HIGH (ref 70–99)
Hemoglobin: 8.2 g/dL — ABNORMAL LOW (ref 13.0–17.0)
Potassium: 3.8 mEq/L (ref 3.5–5.1)

## 2012-06-25 IMAGING — CR DG CHEST 1V PORT
1 series · 1 of 1 positions shown · non-contrast
Comparison: [DATE]

CLINICAL DATA: Coronary artery bypass graft.

PORTABLE CHEST - 1 VIEW

[AP]
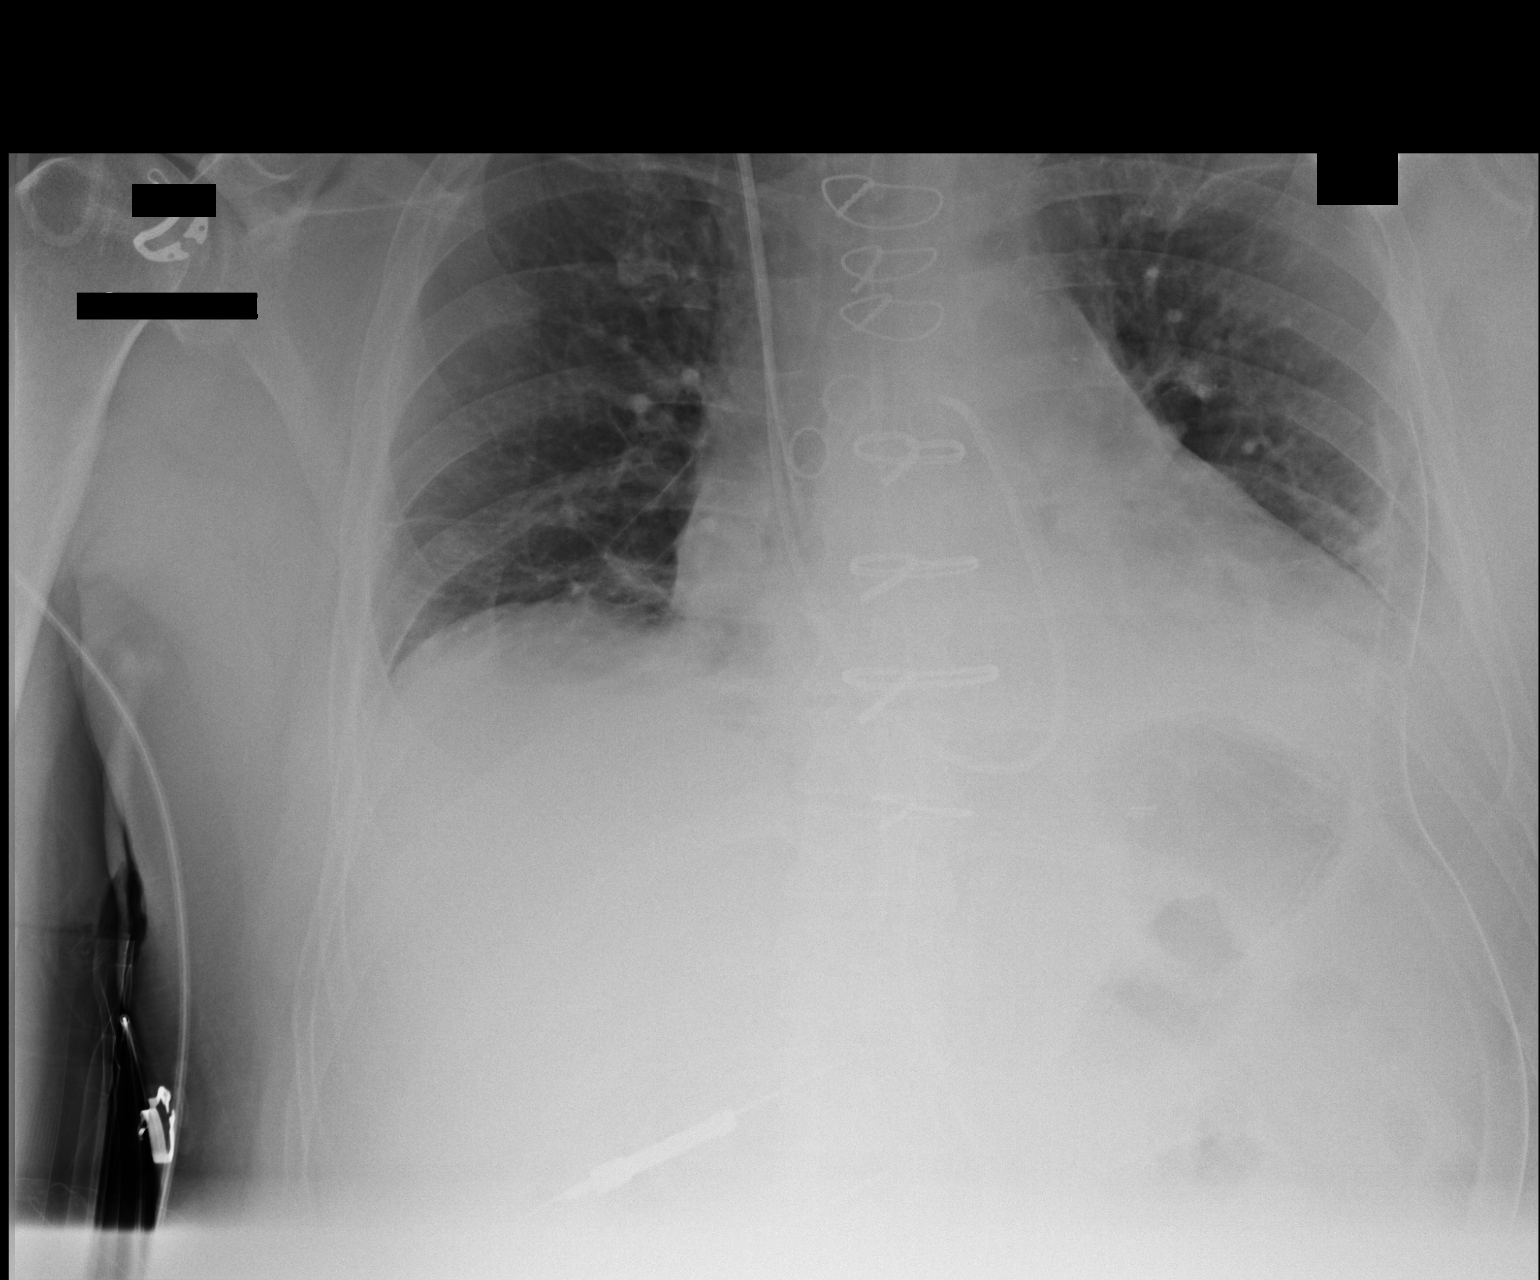

[1 of 1 positions shown; findings below may reference images not displayed]

FINDINGS: Interval extubation and removal of NG tube.  Swan-Ganz
catheter and left chest tube remain in place, unchanged.  There are
low lung volumes.  Bibasilar atelectasis with small effusions.
Cardiomegaly with vascular congestion.  No significant change since
prior study. No pneumothorax.
IMPRESSION: Interval extubation.  Otherwise no change.

## 2012-06-25 MED ORDER — ISOSORBIDE MONONITRATE 15 MG HALF TABLET
15.0000 mg | ORAL_TABLET | Freq: Every day | ORAL | Status: DC
  Administered 2012-06-25 – 2012-06-29 (×5): 15 mg via ORAL
  Filled 2012-06-25 (×5): qty 1

## 2012-06-25 MED ORDER — INSULIN ASPART 100 UNIT/ML ~~LOC~~ SOLN: 0.0000 [IU] | SUBCUTANEOUS | Status: DC

## 2012-06-25 MED ORDER — ALPRAZOLAM 0.5 MG PO TABS
0.5000 mg | ORAL_TABLET | Freq: Two times a day (BID) | ORAL | Status: DC | PRN
  Administered 2012-06-25: 0.5 mg via ORAL
  Filled 2012-06-25: qty 1

## 2012-06-25 MED ORDER — ALBUMIN HUMAN 5 % IV SOLN
12.5000 g | Freq: Once | INTRAVENOUS | Status: AC
  Administered 2012-06-25: 12.5 g via INTRAVENOUS
  Filled 2012-06-25: qty 250

## 2012-06-25 NOTE — Op Note (Signed)
Edward Fisher, Edward Fisher                 ACCOUNT NO.:  [id number]  MEDICAL RECORD NO.:  [id number]  LOCATION:  2303                         FACILITY:  [facility]  PHYSICIAN:  Sheliah Plane, MD    DATE OF BIRTH:  09-26-1966  DATE OF PROCEDURE:  06/24/2012 DATE OF DISCHARGE:                              OPERATIVE REPORT   PREOPERATIVE DIAGNOSIS:  Unstable angina with three-vessel coronary artery disease.  POSTOPERATIVE DIAGNOSIS:  Unstable angina with three-vessel coronary artery disease.  SURGICAL PROCEDURE:  Coronary artery bypass grafting x4 with the left internal mammary to the left anterior descending coronary artery, left radial bypass to the circumflex coronary artery, sequential reverse saphenous vein graft to the distal right coronary artery and distal posterior descending coronary artery with left radial artery harvesting and right thigh endovein harvesting.  SURGEON:  Sheliah Plane, MD  FIRST ASSISTANT:  Lowella Dandy, PA  BRIEF HISTORY:  The patient is a 46 year old male with several-week history of increasing episodes of exertional substernal chest pain that had been coming progressive to the point of having episodes of rest pain.  He has no primary care physician and initially was seen in the [facility] Department, prescribed Prilosec.  Because of ongoing symptoms, he was seen by Dr. Dietrich Pates and arrange for cardiac catheterization.  At the time of catheterization by Dr. Excell Seltzer, the patient was found to have a long complex high-grade LAD stenosis of greater than 80%, 80% circumflex stenosis, 70% mid right coronary artery stenosis, and 80-90% stenosis of the mid posterior descending.  Overall ventricular function was preserved.  Risks and options of coronary artery bypass grafting were discussed with the patient, agreed and signed informed consent.  DESCRIPTION OF PROCEDURE:  With Swan-Ganz arterial line monitors in place, the patient underwent general  endotracheal anesthesia without incident.  Skin of chest and legs were prepped with Betadine and draped in usual sterile manner.  Transesophageal echoprobe was placed by Dr. Michelle Piper and has dictated under separate note.  Overall, ventricular function appeared preserved on TEE.  The left arm, chest and legs were prepped with Betadine and draped in usual sterile manner.  The Allen's test in the left arm was negative.  Preoperatively, this was checked again prior to starting the procedure.  Curvilinear incision was made over the left forearm.  Dissection was carried down to the radial artery, which was appeared of good quality and size.  Dissection was carried almost to the antecubital fossa.  Using the electrocautery device, the small side branches of the mammary artery were divided. With the artery free, proximally distal ends were ligated and artery was flushed with solution and appeared to be of good quality at length. Fascial layer was closed with a running 3-0 Vicryl, and 3-0 subcuticular stitch was used and skin edges.  At the completion, the hand was neurovascularly intact and was tucked back at the side of the table. Attention was then turned to the harvesting the right thigh vein with the Guidant endo-harvesting device.  Segment of vein was harvested was of good quality and caliber.  Median sternotomy was performed.  Left internal mammary artery was dissected down as a pedicle graft.  Distal artery was  divided had good free flow.  Pericardium was opened. Overall, ventricular function appeared preserved.  The patient was systemically heparinized.  Ascending aorta was cannulated.  The right atrium was cannulated and aortic root vent cardioplegia needle was introduced into the ascending aorta.  The patient was placed on cardiopulmonary bypass 2.4 liters/minute/meter squared.  Sites of anastomosis were dissected out.  It should be noted that for the patient's age, he had very severe  diffuse distal disease in his coronary vessels.  While starting cool and before the application of the crossclamp, the drive line on the oximeter became dislodged resulting loss of about 500 mL of blood.  The perfusionist rapidly reattached this without any obvious sequelae.  Hematocrit was checked and it was in satisfactory range and no blood transfusion was required.  We then proceeded with the case that the body temperature cooled to 30 degrees. Aortic crossclamp was applied and 500 mL of cold blood potassium cardioplegia was administered with diastolic arrest of the heart. Attention was turned first to the circumflex coronary artery, which was a relatively small vessel, but did admit a 1.5-mm probe using a running 8-0 Prolene.  The radial artery was anastomosed through the circumflex coronary artery.  Additional cold blood cardioplegia was administered down the radial artery.  Attention was then turned to the distal right coronary artery, which was somewhat thickened vessel of good size. Using a longitudinal side-to-side anastomosis was carried out with running 7-0 Prolene, distal extent of the same vein was then carried out to the distal portion of the posterior descending coronary artery, which was relatively small, but admitted a 1 mm probe distally.  Using a running 7-0 Prolene, distal anastomosis was performed.  Attention was then turned to the left anterior descending coronary artery.  Between the mid and distal third of the vessel was opened, the LAD was thickened, diffusely diseased vessel.  Using a running 8-0 Prolene, left internal mammary artery was anastomosed to the left anterior descending coronary artery.  With the crossclamp still in place, two punch aortotomies were made in the ascending aorta, the vein graft to the right and the radial artery with circumflex of each anastomosis to the ascending aorta.  The heart was allowed to passively fill and de-aired. The aortic  crossclamp was removed.  Total crossclamp time was 93 minutes.  The bulldog on the mammary artery was removed prior to removal of the crossclamp with prompt rise in myocardial septal temperature. The patient was spontaneously converted to a sinus rhythm.  Sites of anastomosis were inspected and were free of bleeding.  He was then ventilated and weaned from cardiopulmonary bypass without difficulty. He remained hemodynamically stable and was decannulated in usual fashion.  Protamine sulfate was administered with operative field hemostatic.  A ventricular pacing wires were applied.  Graft markers were applied.  I left pleural tube and Blake mediastinal tube left in place.  Sternum was closed with 6 stainless steel wire.  Fascia was closed with interrupted 0 Vicryl, running 3-0 Vicryl in the subcutaneous tissue, and 4-0 subcuticular stitch in skin edges.  Dry dressings were applied.  Sponge and needle count were reported as correct at the completion of the procedure.  The patient tolerated the procedure without obvious complication and without need for blood transfusion.     Sheliah Plane, MD     EG/MEDQ  D:  06/25/2012  T:  06/25/2012  Job:  [id number]  cc:   Gerrit Friends. Dietrich Pates, MD, [facility]

## 2012-06-25 NOTE — Progress Notes (Signed)
Patient ID: Edward Fisher, male   DOB: [birth year], 46 y.o.   MRN: [id number]   Filed Vitals:   06/25/12 2000 06/25/12 2056 06/25/12 2100 06/25/12 2200  BP: 113/51 104/53  102/52  Pulse: 111 108 104 98  Temp:      TempSrc:      Resp: 14  28 28   Height:      Weight:      SpO2: 89%  100% 97%   Urine output good  CBC    Component Value Date/Time   WBC 12.8* 06/25/2012 1654   RBC 3.08* 06/25/2012 1654   HGB 8.2* 06/25/2012 1702   HCT 24.0* 06/25/2012 1702   PLT 112* 06/25/2012 1654   MCV 88.3 06/25/2012 1654   MCH 29.9 06/25/2012 1654   MCHC 33.8 06/25/2012 1654   RDW 13.2 06/25/2012 1654   LYMPHSABS 2.8 06/21/2012 1800   MONOABS 0.8 06/21/2012 1800   EOSABS 0.3 06/21/2012 1800   BASOSABS 0.1 06/21/2012 1800    BMET    Component Value Date/Time   NA 139 06/25/2012 1702   K 3.8 06/25/2012 1702   CL 102 06/25/2012 1702   CO2 24 06/25/2012 0433   GLUCOSE 147* 06/25/2012 1702   BUN 16 06/25/2012 1702   CREATININE 1.00 06/25/2012 1702   CALCIUM 8.2* 06/25/2012 0433   GFRNONAA >90 06/25/2012 1654   GFRAA >90 06/25/2012 1654    A/P:  Stable day POD 1

## 2012-06-25 NOTE — Plan of Care (Signed)
Problem: Phase II Progression Outcomes Goal: Patient extubated within - Outcome: Completed/Met Date Met:  06/25/12 Pt extubated within 6 hours

## 2012-06-25 NOTE — Care Management Note (Unsigned)
    Page 1 of 1   06/25/2012     1:03:05 PM   CARE MANAGEMENT NOTE 06/25/2012  Patient:  Edward Fisher, Edward Fisher   Account Number:  [id number]  Date Initiated:  06/25/2012  Documentation initiated by:  Karinne Schmader  Subjective/Objective Assessment:   PT S/P CABG X 4 ON 06/24/12.  PTA, PT INDEPENDENT, LIVES WITH SPOUSE.     Action/Plan:   MET WITH PT AND FAMILY TO DISCUSS DC PLANS.  WIFE AND DAUGHTERS TO PROVIDE 24HR SUPERVISION AT DC. WILL FOLLOW FOR HOME NEEDS AS PT PROGRESSES.   Anticipated DC Date:  06/29/2012   Anticipated DC Plan:  HOME W HOME HEALTH SERVICES      DC Planning Services  CM consult      Choice offered to / List presented to:             Status of service:  In process, will continue to follow Medicare Important Message given?   (If response is "NO", the following Medicare IM given date fields will be blank) Date Medicare IM given:   Date Additional Medicare IM given:    Discharge Disposition:    Per UR Regulation:    If discussed at Long Length of Stay Meetings, dates discussed:    Comments:

## 2012-06-25 NOTE — Progress Notes (Signed)
Patient ID: Edward Fisher, male   DOB: 11-May-1966, 46 y.o.   MRN: [id number] TCTS DAILY PROGRESS NOTE                   [address]            [place] [address]          [phone]      1 Day Post-Op Procedure(s) (LRB): CORONARY ARTERY BYPASS GRAFTING (CABG) (N/A)  Total Length of Stay:  LOS: 3 days   Subjective: Awake and alert, extubated last night  Objective: Vital signs in last 24 hours: Temp:  [95.5 F (35.3 C)-99.3 F (37.4 C)] 99.3 F (37.4 C) (08/22 0800) Pulse Rate:  [67-101] 82  (08/22 0800) Cardiac Rhythm:  [-] Normal sinus rhythm (08/22 0800) Resp:  [10-26] 18  (08/22 0800) BP: (81-112)/(43-70) 81/43 mmHg (08/22 0800) SpO2:  [93 %-100 %] 95 % (08/22 0800) Arterial Line BP: (77-120)/(46-70) 80/46 mmHg (08/22 0800) FiO2 (%):  [39.9 %-50 %] 40 % (08/21 1845) Weight:  [207 lb 10.8 oz (94.2 kg)-210 lb 5.1 oz (95.4 kg)] 210 lb 5.1 oz (95.4 kg) (08/22 0500)  Filed Weights   06/24/12 0405 06/24/12 1600 06/25/12 0500  Weight: 207 lb 10.8 oz (94.2 kg) 207 lb 10.8 oz (94.2 kg) 210 lb 5.1 oz (95.4 kg)    Weight change: 0 lb (0 kg)   Hemodynamic parameters for last 24 hours: PAP: (23-40)/(10-24) 32/17 mmHg CO:  [4.8 L/min-7.7 L/min] 6.8 L/min CI:  [2.3 L/min/m2-3.6 L/min/m2] 3.2 L/min/m2  Intake/Output from previous day: 08/21 0701 - 08/22 0700 In: 5466.3 [P.O.:120; I.V.:4561.3; Blood:395; IV Piggyback:390] Out: 6750 [Urine:6370; Chest Tube:380]  Intake/Output this shift: Total I/O In: 165 [P.O.:120; I.V.:45] Out: -   Current Meds: Scheduled Meds:   . acetaminophen (TYLENOL) oral liquid 160 mg/5 mL  650 mg Per Tube NOW   Or  . acetaminophen  650 mg Rectal NOW  . acetaminophen  1,000 mg Oral Q6H   Or  . acetaminophen (TYLENOL) oral liquid 160 mg/5 mL  975 mg Per Tube Q6H  . aspirin EC  325 mg Oral Daily   Or  . aspirin  324 mg Per Tube Daily  . atorvastatin  80 mg Oral q1800  . bisacodyl  10 mg Oral Daily   Or  . bisacodyl  10 mg  Rectal Daily  . cefUROXime (ZINACEF)  IV  1.5 g Intravenous To OR  . cefUROXime (ZINACEF)  IV  1.5 g Intravenous Q12H  . dexmedetomidine  0.1-0.7 mcg/kg/hr Intravenous To OR  . docusate sodium  200 mg Oral Daily  . insulin aspart  0-24 Units Subcutaneous Q2H   Followed by  . insulin aspart  0-24 Units Subcutaneous Q4H  . insulin regular  0-10 Units Intravenous TID WC  . magnesium sulfate  4 g Intravenous Once  . metoprolol tartrate  12.5 mg Oral BID   Or  . metoprolol tartrate  12.5 mg Per Tube BID  . nitroglycerin-nicardipine-HEPARIN-sodium bicarbonate irrigation for artery spasm   Irrigation To OR  . pantoprazole  40 mg Oral Q1200  . potassium chloride  10 mEq Intravenous Q1 Hr x 3  . sodium chloride  3 mL Intravenous Q12H  . tranexamic acid  15 mg/kg Intravenous To OR  . vancomycin  1,500 mg Intravenous To OR  . vancomycin  1,000 mg Intravenous Once  Continuous Infusions:   . sodium chloride 20 mL/hr at 06/24/12 2200  . sodium chloride    . sodium chloride 250 mL (06/25/12 0600)  . dexmedetomidine Stopped (06/25/12 0130)  . insulin (NOVOLIN-R) infusion Stopped (06/24/12 2216)  . lactated ringers 20 mL/hr at 06/24/12 2200  . nitroGLYCERIN 16.7 mcg/min (06/25/12 0800)  . phenylephrine (NEO-SYNEPHRINE) Adult infusion 45 mcg/min (06/25/12 0800)  . DISCONTD: heparin 1,400 Units/hr (06/24/12 0600)   PRN Meds:.albumin human, lactated ringers, metoprolol, midazolam, morphine injection, morphine injection, ondansetron (ZOFRAN) IV, oxyCODONE, sodium chloride, DISCONTD: acetaminophen, DISCONTD: ALPRAZolam, DISCONTD: ALPRAZolam, DISCONTD: nitroGLYCERIN, DISCONTD: ondansetron (ZOFRAN) IV, DISCONTD: Surgifoam 1 Gm with 0.9% sodium chloride (4 ml) topical solution, DISCONTD: zolpidem  General appearance: alert and cooperative Neurologic: intact Heart: regular rate and rhythm, S1, S2 normal, no  murmur, click, rub or gallop and normal apical impulse Lungs: diminished breath sounds bibasilar Abdomen: soft, non-tender; bowel sounds normal; no masses,  no organomegaly Extremities: extremities normal, atraumatic, no cyanosis or edema and left hand neuro vascular intact Wound: sternum stable   Lab Results: CBC: Basename 06/25/12 0433 06/24/12 2248 06/24/12 2230  WBC 12.6* -- 11.1*  HGB 9.8* 9.5* --  HCT 29.1* 28.0* --  PLT 135* -- 125*   BMET:  Basename 06/25/12 0433 06/24/12 2248 06/24/12 0500  NA 139 143 --  K 4.1 4.4 --  CL 108 108 --  CO2 24 -- 22  GLUCOSE 142* 130* --  BUN 12 9 --  CREATININE 0.79 0.80 --  CALCIUM 8.2* -- 9.9    PT/INR:  Basename 06/24/12 1600  LABPROT 18.4*  INR 1.50*   Radiology: Dg Chest 2 View  06/23/2012  *RADIOLOGY REPORT*  Clinical Data: Preop CABG.  CHEST - 2 VIEW  Comparison: 06/21/2012  Findings: Mild chronic peribronchial thickening.  Mild interstitial prominence, particularly in the lung bases.  Heart is normal size. No effusions or acute bony abnormality.  IMPRESSION: Chronic bronchitic changes and increased markings in the lung bases.  No acute findings.   Original Report Authenticated By: Cyndie Chime, M.D.    Dg Chest Portable 1 View In Am  06/25/2012  *RADIOLOGY REPORT*  Clinical Data: Coronary artery bypass graft.  PORTABLE CHEST - 1 VIEW  Comparison: 06/24/2012  Findings: Interval extubation and removal of NG tube.  Swan-Ganz catheter and left chest tube remain in place, unchanged.  There are low lung volumes.  Bibasilar atelectasis with small effusions. Cardiomegaly with vascular congestion.  No significant change since prior study. No pneumothorax.  IMPRESSION: Interval extubation.  Otherwise no change.   Original Report Authenticated By: Cyndie Chime, M.D.    Dg Chest Portable 1 View  06/24/2012  *RADIOLOGY REPORT*  Clinical Data: Status post CABG  PORTABLE CHEST - 1 VIEW  Comparison: 06/23/2012  Findings: ET tube tip is above  the carina.  There is a Swan-Ganz catheter with tip in the main pulmonary artery.  Mediastinal drain and left-sided chest tube are noted.  There is a nasogastric tube with tip in the stomach.  Lung volumes are low.  There is a small left effusion.  Atelectasis is noted in the left base.  No pneumothorax present.  IMPRESSION:  1.  Small left effusion and left base atelectasis. 2.  Low lung volumes.   Original Report Authenticated By: Rosealee Albee, M.D.      Assessment/Plan: S/P Procedure(s) (LRB): CORONARY ARTERY BYPASS GRAFTING (CABG) (N/A) Mobilize Diabetes control d/c tubes/lines Continue foley due to strict I&O, patient in ICU and urinary  output monitoring See progression orders wean ntg and use Imdur for radial graft Expected Acute  Blood - loss Anemia      Delight Ovens MD  Beeper [phone] Office [phone] 06/25/2012 8:46 AM

## 2012-06-26 ENCOUNTER — Inpatient Hospital Stay (HOSPITAL_COMMUNITY): Payer: Self-pay

## 2012-06-26 DIAGNOSIS — I2510 Atherosclerotic heart disease of native coronary artery without angina pectoris: Secondary | ICD-10-CM

## 2012-06-26 DIAGNOSIS — Z951 Presence of aortocoronary bypass graft: Secondary | ICD-10-CM

## 2012-06-26 LAB — BASIC METABOLIC PANEL
BUN: 14 mg/dL (ref 6–23)
CO2: 25 mEq/L (ref 19–32)
Calcium: 8.7 mg/dL (ref 8.4–10.5)
Chloride: 104 mEq/L (ref 96–112)
Creatinine, Ser: 0.79 mg/dL (ref 0.50–1.35)
GFR calc Af Amer: >90 mL/min (ref >90)
GFR calc non Af Amer: >90 mL/min (ref >90)
Glucose, Bld: 148 mg/dL — ABNORMAL HIGH (ref 70–99)
Potassium: 3.7 mEq/L (ref 3.5–5.1)
Sodium: 136 mEq/L (ref 135–145)

## 2012-06-26 LAB — GLUCOSE, CAPILLARY
Glucose-Capillary: 121 mg/dL — ABNORMAL HIGH (ref 70–99)
Glucose-Capillary: 136 mg/dL — ABNORMAL HIGH (ref 70–99)
Glucose-Capillary: 136 mg/dL — ABNORMAL HIGH (ref 70–99)
Glucose-Capillary: 153 mg/dL — ABNORMAL HIGH (ref 70–99)

## 2012-06-26 LAB — CBC
HCT: 26.1 % — ABNORMAL LOW (ref 39.0–52.0)
Hemoglobin: 8.9 g/dL — ABNORMAL LOW (ref 13.0–17.0)
MCH: 29.7 pg (ref 26.0–34.0)
MCHC: 34.1 g/dL (ref 30.0–36.0)
MCV: 87 fL (ref 78.0–100.0)
Platelets: 101 10*3/uL — ABNORMAL LOW (ref 150–400)
RBC: 3 MIL/uL — ABNORMAL LOW (ref 4.22–5.81)
RDW: 13.1 % (ref 11.5–15.5)
WBC: 10.9 10*3/uL — ABNORMAL HIGH (ref 4.0–10.5)

## 2012-06-26 IMAGING — CR DG CHEST 1V PORT
1 series · 1 of 1 positions shown · non-contrast
Comparison: [DATE] and [DATE].

CLINICAL DATA: Postop CABG.

PORTABLE CHEST - 1 VIEW

[AP]
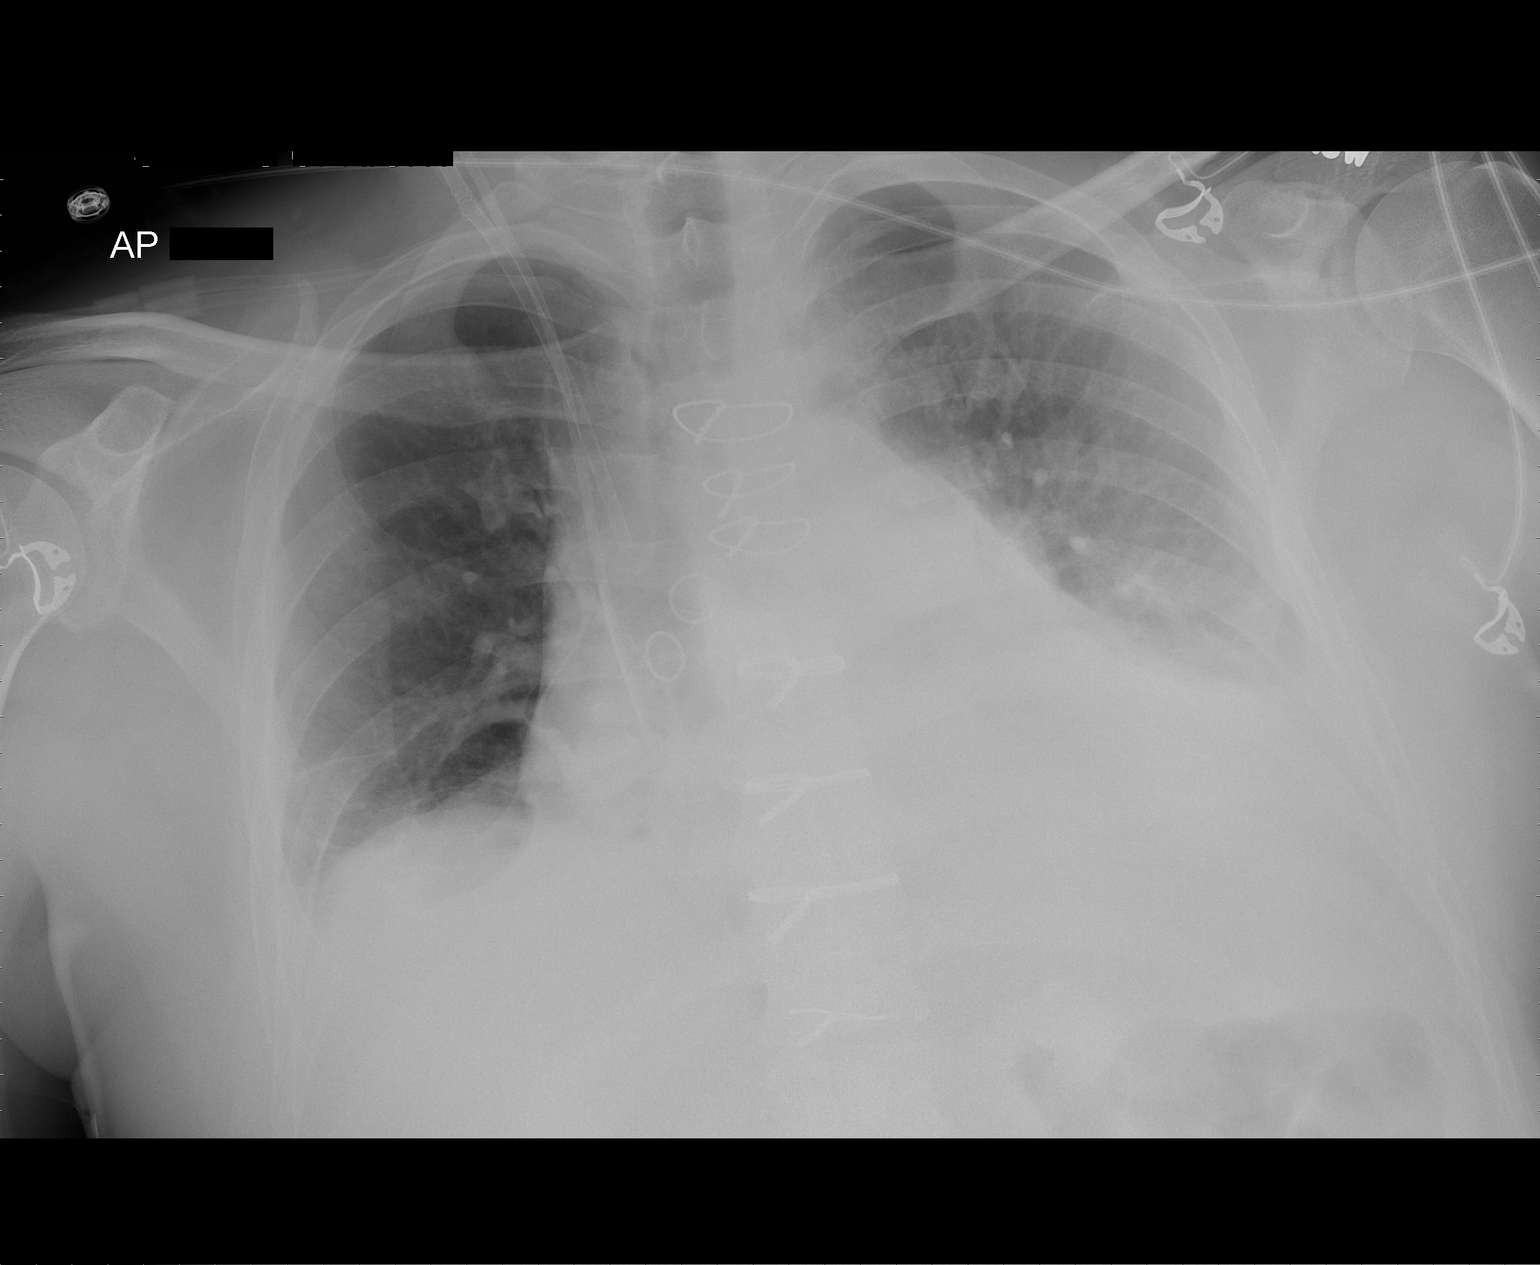

[1 of 1 positions shown; findings below may reference images not displayed]

FINDINGS: [JH] hours.  The Swan-Ganz catheter and left chest tube
have been removed.  Right IJ central venous catheter and sheath
remains in place.  No pneumothorax is identified.  There are
persistent small pleural effusions with associated bibasilar
atelectasis.  Heart size and mediastinal contours are stable.
IMPRESSION: No evidence of pneumothorax following partial support system
withdrawal.  Persistent pleural effusions and bibasilar
atelectasis.

## 2012-06-26 MED ORDER — SODIUM CHLORIDE 0.9 % IJ SOLN
3.0000 mL | Freq: Two times a day (BID) | INTRAMUSCULAR | Status: DC
  Administered 2012-06-26 – 2012-06-29 (×7): 3 mL via INTRAVENOUS

## 2012-06-26 MED ORDER — ATORVASTATIN CALCIUM 80 MG PO TABS: 80.0000 mg | ORAL_TABLET | Freq: Every day | ORAL | Status: DC

## 2012-06-26 MED ORDER — PANTOPRAZOLE SODIUM 40 MG PO TBEC
40.0000 mg | DELAYED_RELEASE_TABLET | Freq: Every day | ORAL | Status: DC
  Administered 2012-06-27 – 2012-06-29 (×3): 40 mg via ORAL
  Filled 2012-06-26 (×4): qty 1

## 2012-06-26 MED ORDER — MOVING RIGHT ALONG BOOK
Freq: Once | Status: AC
  Administered 2012-06-26: 09:00:00
  Filled 2012-06-26: qty 1

## 2012-06-26 MED ORDER — FUROSEMIDE 40 MG PO TABS
40.0000 mg | ORAL_TABLET | Freq: Every day | ORAL | Status: DC
  Administered 2012-06-26 – 2012-06-29 (×4): 40 mg via ORAL
  Filled 2012-06-26 (×4): qty 1

## 2012-06-26 MED ORDER — FUROSEMIDE 40 MG PO TABS: 40.0000 mg | ORAL_TABLET | Freq: Every day | ORAL | Status: DC

## 2012-06-26 MED ORDER — METOPROLOL TARTRATE 25 MG PO TABS: 12.5000 mg | ORAL_TABLET | Freq: Two times a day (BID) | ORAL | Status: DC

## 2012-06-26 MED ORDER — SODIUM CHLORIDE 0.9 % IJ SOLN: 3.0000 mL | INTRAMUSCULAR | Status: DC | PRN

## 2012-06-26 MED ORDER — POTASSIUM CHLORIDE 10 MEQ/50ML IV SOLN
10.0000 meq | INTRAVENOUS | Status: AC | PRN
  Administered 2012-06-26 (×3): 10 meq via INTRAVENOUS

## 2012-06-26 MED ORDER — INSULIN ASPART 100 UNIT/ML ~~LOC~~ SOLN: 0.0000 [IU] | Freq: Four times a day (QID) | SUBCUTANEOUS | Status: DC

## 2012-06-26 MED ORDER — POTASSIUM CHLORIDE CRYS ER 20 MEQ PO TBCR
20.0000 meq | EXTENDED_RELEASE_TABLET | Freq: Every day | ORAL | Status: DC
  Administered 2012-06-26 – 2012-06-29 (×4): 20 meq via ORAL
  Filled 2012-06-26 (×4): qty 1

## 2012-06-26 MED ORDER — OXYCODONE HCL 5 MG PO TABS
5.0000 mg | ORAL_TABLET | ORAL | Status: DC | PRN
  Administered 2012-06-26 (×2): 10 mg via ORAL
  Administered 2012-06-27: 5 mg via ORAL
  Filled 2012-06-26: qty 2
  Filled 2012-06-26: qty 1
  Filled 2012-06-26 (×3): qty 2

## 2012-06-26 MED ORDER — ISOSORBIDE MONONITRATE 15 MG HALF TABLET: 15.0000 mg | ORAL_TABLET | Freq: Every day | ORAL | Status: DC

## 2012-06-26 MED ORDER — TRAMADOL HCL 50 MG PO TABS: 50.0000 mg | ORAL_TABLET | ORAL | Status: DC | PRN

## 2012-06-26 MED ORDER — INSULIN ASPART 100 UNIT/ML ~~LOC~~ SOLN
0.0000 [IU] | Freq: Four times a day (QID) | SUBCUTANEOUS | Status: DC
  Administered 2012-06-26 (×2): 2 [IU] via SUBCUTANEOUS

## 2012-06-26 MED ORDER — ALPRAZOLAM 0.25 MG PO TABS
0.2500 mg | ORAL_TABLET | Freq: Every evening | ORAL | Status: DC | PRN
  Administered 2012-06-26 – 2012-06-28 (×2): 0.25 mg via ORAL
  Filled 2012-06-26 (×2): qty 1

## 2012-06-26 MED ORDER — OXYCODONE HCL 5 MG PO TABS: 5.0000 mg | ORAL_TABLET | ORAL | Status: DC | PRN

## 2012-06-26 MED ORDER — POTASSIUM CHLORIDE CRYS ER 20 MEQ PO TBCR: 20.0000 meq | EXTENDED_RELEASE_TABLET | Freq: Every day | ORAL | Status: DC

## 2012-06-26 MED ORDER — FOLIC ACID 1 MG PO TABS
1.0000 mg | ORAL_TABLET | Freq: Every day | ORAL | Status: DC
  Administered 2012-06-26 – 2012-06-29 (×4): 1 mg via ORAL
  Filled 2012-06-26 (×4): qty 1

## 2012-06-26 MED ORDER — TRAMADOL HCL 50 MG PO TABS
50.0000 mg | ORAL_TABLET | ORAL | Status: DC | PRN
Start: 2012-06-26 — End: 2012-06-29
  Administered 2012-06-27: 50 mg via ORAL
  Administered 2012-06-27: 100 mg via ORAL
  Administered 2012-06-27: 50 mg via ORAL
  Administered 2012-06-28: 100 mg via ORAL
  Administered 2012-06-28: 50 mg via ORAL
  Administered 2012-06-28 – 2012-06-29 (×2): 100 mg via ORAL
  Administered 2012-06-29: 50 mg via ORAL
  Filled 2012-06-26: qty 1
  Filled 2012-06-26 (×3): qty 2
  Filled 2012-06-26 (×2): qty 1
  Filled 2012-06-26: qty 2
  Filled 2012-06-26: qty 1

## 2012-06-26 MED ORDER — POTASSIUM CHLORIDE CRYS ER 20 MEQ PO TBCR
30.0000 meq | EXTENDED_RELEASE_TABLET | Freq: Once | ORAL | Status: DC
  Filled 2012-06-26: qty 1

## 2012-06-26 MED ORDER — ASPIRIN 325 MG PO TBEC: 325.0000 mg | DELAYED_RELEASE_TABLET | Freq: Every day | ORAL | Status: DC

## 2012-06-26 MED ORDER — INSULIN ASPART 100 UNIT/ML ~~LOC~~ SOLN
0.0000 [IU] | Freq: Three times a day (TID) | SUBCUTANEOUS | Status: DC
  Administered 2012-06-26: 2 [IU] via SUBCUTANEOUS

## 2012-06-26 MED ORDER — GUAIFENESIN ER 600 MG PO TB12
600.0000 mg | ORAL_TABLET | Freq: Two times a day (BID) | ORAL | Status: DC | PRN
  Filled 2012-06-26: qty 1

## 2012-06-26 MED ORDER — SODIUM CHLORIDE 0.9 % IV SOLN: 250.0000 mL | INTRAVENOUS | Status: DC | PRN

## 2012-06-26 MED FILL — Magnesium Sulfate Inj 50%: INTRAMUSCULAR | Qty: 10 | Status: AC

## 2012-06-26 MED FILL — Potassium Chloride Inj 2 mEq/ML: INTRAVENOUS | Qty: 40 | Status: AC

## 2012-06-26 NOTE — Discharge Summary (Addendum)
Physician Discharge Summary  Patient ID: Edward Fisher MRN: [id number] DOB/AGE: 1966/10/02 46 y.o.  Admit date: 06/22/2012 Discharge date: 06/26/2012  Admission Diagnoses:  Patient Active Problem List  Diagnosis  . Hyperlipidemia  . Unstable angina  . CAD in native artery   Discharge Diagnoses:   Patient Active Problem List  Diagnosis  . Hyperlipidemia  . Unstable angina  . CAD in native artery  . S/P CABG x 4   Discharged Condition: good  History of Present Illness:   Mr. Edward Fisher is a 46 yo white male with strong family history of CAD.  The patient has been in good health except for some mild hyperlipidemia.  However, over the past 2 months he has noted he has developed progressive chest pressure brought on by exertion.  If he rests the pain resolves.  He also experiences some occasional shortness of breath.  He was evaluated by his local health department at which time he was placed on Zantac.  However on 06/21/2012 the patient developed frequent episodes of chest discomfort prompting him to report to the Emergency Department.  Workup done revealed the patient was most likely suffering from angina.  Cardiology was consulted and recommended the patient be placed on ASA, Metoprolol and NTG prn.  He was set up to follow up with [facility] the next morning.  He was evaluated by Dr. Dietrich Fisher who felt the patient was very high risk for coronary disease.  He felt the patient should be admitted to the hospital for angina management and subsequent cardiac catheterization.    Hospital Course:   The patient was admitted to the hospital by Dr. Dietrich Fisher.  He was placed on antianginal medications.  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  The patient was placed on IV heparin and TCTS was consulted for possible coronary bypass procedure.  He was evaluated by Dr. Tyrone Fisher who felt the patient would benefit  from coronary bypass.  The risks and benefits of the procedure were explained to the patient and he was agreeable to proceed.  HD #2 the patient was taken to the operating room and underwent CABG x 4 utilizing LIMA to LAD, Radial Artery to Left Circumflex, and Sequential SVG to distal RCA and distal PDA.  The patient also underwent open radial harvest of his left arm and endoscopic saphenous vein harvest of his right thigh.  He tolerated the procedure well and was taken to the SICU in stable condition.  POD #0 the patient was extubated.  POD #1 the patients chest tubes and arterial lines were removed without difficulty.  He was weaned off nitroglycerin and started on Imdur for his radial graft.  POD #2 chest xray does not show any evidence of pneumothorax.  There are bilateral pleural effusions and atelectasis present.  He was medically stable and transferred to the telemetry unit.  The patient is doing well.  Should he maintain NSR, will remove external pacing wires in the next 24-48 hours.  Should no further issues arise he will be ready for discharge after wire removal.  He will need to follow with Dr. Dennie Fisher office on 07/20/2012 with an appointment at 1:00pm.  The patient will need to have a chest xray performed one hour prior to his appointment.  He will also need to contact Dr. Marvel Fisher office to set up a 2 week follow up visit.   Significant Diagnostic Studies: catheterization  Hemodynamics:  AO 109/71  LV 109/14  Coronary angiography:   Coronary dominance: right  Left mainstem: The left mainstem is patent. There is mild nonobstructive plaque in the distal left main and I would estimate at 20-30%.  Left anterior descending (LAD): The proximal LAD has diffuse irregularity, but tapers with 50% stenosis leading into the first diagonal branch. The first diagonal branch covers a large territory and has no significant obstructive disease. The LAD just beyond the first diagonal has critical stenosis  with 95-99% disease followed by severe diffuse disease throughout the midportion of the vessel. The distal vessel fills competitively from antegrade flow and collateral flow supplied by the terminal portion of the RCA.  Left circumflex (LCx): The AV groove circumflex is patent throughout the proximal aspect. It supplies a large atrial branch. The vessel gives off one large obtuse marginal branch that has a 95% stenosis in its proximal aspect. The mid and distal portions of the OM are widely patent.  Right coronary artery (RCA): The RCA is a large, dominant vessel. There is moderate calcification throughout the course of the right coronary artery. There is mild diffuse nonobstructive plaque throughout the vessel. There is a focal area of moderately severe stenosis in the midportion of the vessel but I would estimate at 80%. Distally, the vessel divides into a PDA branch and 2 posterolateral branches. The PDA branch is large throughout its proximal segment. The midportion of the PDA has a tight 95% stenosis and then the terminal portion of the PDA is free of significant disease. The first posterolateral branch is large in caliber and has 50% stenosis in its proximal aspect. There is no high-grade stenosis throughout that PL branch. The second posterolateral was small.  Left ventriculography: Left ventricular systolic function is normal, LVEF is estimated at 55-65%, there is no significant mitral regurgitation    Treatments: surgery:   Coronary artery bypass grafting x4 with the left  internal mammary to the left anterior descending coronary artery, left  radial bypass to the circumflex coronary artery, sequential reverse  saphenous vein graft to the distal right coronary artery and distal  posterior descending coronary artery with left radial artery harvesting  and right thigh endovein harvesting.  Disposition: 01-Home or Self Care  Discharge Orders    Future Appointments: Provider: Department: Dept  Phone: Center:   07/20/2012 1:00 PM [facility] [phone] [facility]     Medication List  As of 06/26/2012  2:08 PM   Medication List  As of 06/29/2012 11:47 AM   STOP taking these medications         acetaminophen 325 MG tablet      ibuprofen 200 MG tablet      nitroGLYCERIN 0.4 MG SL tablet         TAKE these medications         ALPRAZolam 0.25 MG tablet   Commonly known as: XANAX   Take 1 tablet (0.25 mg total) by mouth at bedtime as needed for anxiety.      aspirin 325 MG EC tablet   Take 1 tablet (325 mg total) by mouth daily.      atorvastatin 80 MG tablet   Commonly known as: LIPITOR   Take 1 tablet (80 mg total) by mouth daily at 6 PM.      Fish Oil 500 MG Caps   Take 2 capsules by mouth daily.      folic acid 1 MG tablet   Commonly known as: [employer]  Take 1 tablet (1 mg total) by mouth daily. For one month then stop.      furosemide 40 MG tablet   Commonly known as: LASIX   Take 1 tablet (40 mg total) by mouth daily. For 3 days      isosorbide mononitrate 15 mg Tb24   Commonly known as: IMDUR   Take 0.5 tablets (15 mg total) by mouth daily.      metoprolol tartrate 25 MG tablet   Commonly known as: LOPRESSOR   Take 1 tablet (25 mg total) by mouth 2 (two) times daily.      potassium chloride SA 20 MEQ tablet   Commonly known as: K-DUR,KLOR-CON   Take 1 tablet (20 mEq total) by mouth daily. For 3 days      ranitidine 150 MG tablet   Commonly known as: ZANTAC   Take 150 mg by mouth daily.      traMADol 50 MG tablet   Commonly known as: ULTRAM   Take 1-2 tablets (50-100 mg total) by mouth every 6 (six) hours as needed for pain.          Please note he has a preserved EF of 55-65%  so he was not discharged on an ACE or ARB.    Follow-up Information    Follow up with Delight Ovens, MD on 07/20/2012. (Appointment is at 1:00)    Contact information:   [address] [place]  [address] [phone]       Follow up with [facility] on 07/20/2012. (Please get chest xray 1 hour prior to appointment Dr. Tyrone Fisher)       Follow up with Parker Bing, MD in 2 weeks. (Contact your Cardiologist office to set up appointment for follow up)    Contact information:   [address] [place] [address] [phone]          Signed: Lowella Dandy 06/26/2012, 2:08 PM

## 2012-06-26 NOTE — Progress Notes (Signed)
Pt pulse ox 82% on RA. Pt put on 1Lnc, did not respond in 1 minute. Pt put on 2Lnc, did not respond. Pt put on 3Lnc, pt pulse ox immediately 98%. Pt put on 1Lnc and pt at 91% after 5 minutes. Pt temperature 100.3. Encouraged pt to use incentive spirometer, cough, and deep breath. Will continue to monitor and assess. Jobe Igo A

## 2012-06-26 NOTE — Progress Notes (Signed)
Pt ambulated x 1 asssit and walker  250 feet, pt tolerated well, not back in room call bell with in reach will monitor patient. Mirabelle Cyphers, Randall An RN

## 2012-06-26 NOTE — Progress Notes (Addendum)
2 Days Post-Op Procedure(s) (LRB): CORONARY ARTERY BYPASS GRAFTING (CABG) (N/A)  Subjective: Patient without complaints. Left hand neuro vascular intact  Objective: Vital signs in last 24 hours: Patient Vitals for the past 24 hrs:  BP Temp Temp src Pulse Resp SpO2 Weight  06/26/12 0733 - 98.9 F (37.2 C) Oral - - - -  06/26/12 0700 112/57 mmHg - - 99  27  95 % -  06/26/12 0600 96/64 mmHg - - 94  27  99 % -  06/26/12 0530 - - - 94  17  97 % -  06/26/12 0515 - - - 94  16  96 % -  06/26/12 0500 96/52 mmHg - - 93  16  96 % 214 lb 1.1 oz (97.1 kg)  06/26/12 0445 - - - 94  17  97 % -  06/26/12 0430 - - - 96  17  97 % -  06/26/12 0415 - - - 97  19  97 % -  06/26/12 0400 106/49 mmHg 98.7 F (37.1 C) Oral 97  21  96 % -  06/26/12 0330 109/62 mmHg - - 101  24  99 % -  06/26/12 0315 - - - 98  26  97 % -  06/26/12 0300 - - - 97  30  98 % -  06/26/12 0245 - - - 94  22  98 % -  06/26/12 0230 - - - 93  20  98 % -  06/26/12 0215 - - - 96  29  99 % -  06/26/12 0200 94/50 mmHg - - 90  19  98 % -  06/26/12 0145 - - - 88  17  98 % -  06/26/12 0130 - - - 92  20  98 % -  06/26/12 0115 - - - 88  20  99 % -  06/26/12 0100 - - - 89  20  98 % -  06/26/12 0045 - - - 88  22  98 % -  06/26/12 0030 - - - 88  22  99 % -  06/26/12 0015 - - - 88  17  99 % -  06/26/12 0000 99/49 mmHg 98.6 F (37 C) Oral 88  2  99 % -  06/25/12 2345 - - - 90  14  97 % -  06/25/12 2330 - - - 91  26  99 % -  06/25/12 2315 - - - 88  21  100 % -  06/25/12 2300 - - - 88  30  100 % -  06/25/12 2245 - - - 96  27  100 % -  06/25/12 2230 - - - 101  25  100 % -  06/25/12 2215 - - - 101  20  98 % -  06/25/12 2200 102/52 mmHg - - 98  28  97 % -  06/25/12 2100 - - - 104  28  100 % -  06/25/12 2056 104/53 mmHg - - 108  - - -  06/25/12 2000 113/51 mmHg - - 111  14  89 % -  06/25/12 1958 - 98.7 F (37.1 C) Oral - - - -  06/25/12 1945 - - - 110  23  90 % -  06/25/12 1930 - - - 108  19  90 % -  06/25/12 1915 - - - 107  20  90 % -    06/25/12 1900 - - - 104  24  93 % -  06/25/12 1800 106/52 mmHg - - 103  14  90 % -  06/25/12 1700 - - - 101  26  92 % -  06/25/12 1600 110/52 mmHg - - 96  28  93 % -  06/25/12 1541 - 99.6 F (37.6 C) Oral - - - -  06/25/12 1500 95/52 mmHg - - 99  6  93 % -  06/25/12 1430 - - - 94  10  94 % -  06/25/12 1415 - - - 93  0  90 % -  06/25/12 1400 95/52 mmHg - - 96  27  91 % -  06/25/12 1345 - - - 95  28  99 % -  06/25/12 1330 - 100.2 F (37.9 C) - 93  26  90 % -  06/25/12 1315 - 100 F (37.8 C) - 91  26  92 % -  06/25/12 1300 - 99.9 F (37.7 C) - 89  30  91 % -  06/25/12 1245 - 99.9 F (37.7 C) - 88  24  90 % -  06/25/12 1230 - 99.7 F (37.6 C) - 89  26  97 % -  06/25/12 1215 - 99.5 F (37.5 C) - 88  26  92 % -  06/25/12 1200 87/43 mmHg 99.5 F (37.5 C) - 87  21  93 % -  06/25/12 1145 - 99.5 F (37.5 C) - 86  22  93 % -  06/25/12 1130 - 99.5 F (37.5 C) - 84  20  93 % -  06/25/12 1115 - 99.5 F (37.5 C) - 86  14  95 % -  06/25/12 1100 - 99.7 F (37.6 C) - 93  28  100 % -  06/25/12 1045 - 99.3 F (37.4 C) - 84  24  96 % -  06/25/12 1030 - 99.3 F (37.4 C) - 85  27  94 % -  06/25/12 1015 - 99.3 F (37.4 C) - 88  25  98 % -  06/25/12 1000 92/49 mmHg 99.3 F (37.4 C) - 86  28  97 % -  06/25/12 0945 - 99.3 F (37.4 C) - 91  25  99 % -  06/25/12 0930 - 99.3 F (37.4 C) - 89  25  97 % -  06/25/12 0915 - 99.5 F (37.5 C) - 87  24  94 % -  06/25/12 0900 - 99.3 F (37.4 C) - 103  23  97 % -  06/25/12 0845 - 99.3 F (37.4 C) - 87  25  97 % -  06/25/12 0830 - 99.3 F (37.4 C) - 86  22  94 % -  06/25/12 0815 - 99.3 F (37.4 C) - 82  19  95 % -  06/25/12 0800 90/50 mmHg 99.3 F (37.4 C) - 82  18  95 % -  06/25/12 0745 88/50 mmHg 99.3 F (37.4 C) - 84  21  96 % -   Pre op weight  94 kg Current Weight  06/26/12 214 lb 1.1 oz (97.1 kg)    Hemodynamic parameters for last 24 hours: PAP: (31-50)/(12-24) 47/18 mmHg CO:  [6.8 L/min] 6.8 L/min CI:  [3.2 L/min/m2] 3.2  L/min/m2  Intake/Output from previous day: 08/22 0701 - 08/23 0700 In: 2224.6 [P.O.:680; I.V.:1094.6; IV Piggyback:450] Out: 1830 [Urine:1790; Chest Tube:40]   Physical Exam:  Cardiovascular: RRR, no murmurs, gallops, or rubs. Pulmonary: Diminished at bases; no rales, wheezes, or rhonchi. Abdomen: Soft, non tender, slight distention,bowel  sounds present. Extremities: Mild bilateral lower extremity edema. Some numbness left thumb. Wounds: Dressings are clean and dry.    Lab Results: CBC: Basename 06/26/12 0402 06/25/12 1702 06/25/12 1654  WBC 10.9* -- 12.8*  HGB 8.9* 8.2* --  HCT 26.1* 24.0* --  PLT 101* -- 112*   BMET:  Basename 06/26/12 0402 06/25/12 1702 06/25/12 0433  NA 136 139 --  K 3.7 3.8 --  CL 104 102 --  CO2 25 -- 24  GLUCOSE 148* 147* --  BUN 14 16 --  CREATININE 0.79 1.00 --  CALCIUM 8.7 -- 8.2*    PT/INR:  Lab Results  Component Value Date   INR 1.50* 06/24/2012   INR 1.00 06/22/2012   ABG:  INR: Will add last result for INR, ABG once components are confirmed Will add last 4 CBG results once components are confirmed  Assessment/Plan:  1. CV - SR. Continue Lopressor 12.5 bid. 2.  Pulmonary - Encourage incentive spirometer.CXR this am shows patient is rotated to the right, bilateral pleural effusions and bibasilar atelectasis, and some pulmonary vascular congestion. 3. Volume Overload - Diurese. 4.  Acute blood loss anemia - H and H stable at 8.9 and 26.1. 5.Supplement potassium. 6.Thrombocytopenia-platelets 101,000. 7. Check HGA1C 8.Transfer to PCTU  ZIMMERMAN,DONIELLE MPA-C 06/26/2012 Off drips, feels better today, to stepdown I have seen and examined Edward Fisher and agree with the above assessment  and plan.  Delight Ovens MD Beeper [phone] Office [phone] 06/26/2012 8:00 AM

## 2012-06-26 NOTE — Plan of Care (Signed)
Problem: Phase III Progression Outcomes Goal: Time patient transferred to PCTU/Telemetry POD Outcome: Completed/Met Date Met:  06/26/12 Pt transferred at 12:00PM Jobe Igo A  Goal: O2 Sat remains > or equal to 90% on room air Outcome: Not Progressing Pt Sats dropped on RA, had to reapply Oxygen 1Lnc, Incentive spirometer encouraged. Jobe Igo A

## 2012-06-26 NOTE — Progress Notes (Signed)
    Subjective:  Feeling pretty well. Expected post-op discomfort. No other complaints.  Objective:  Vital Signs in the last 24 hours: Temp:  [98.6 F (37 C)-100.2 F (37.9 C)] 98.7 F (37.1 C) (08/23 0400) Pulse Rate:  [82-111] 94  (08/23 0530) Resp:  [0-30] 17  (08/23 0530) BP: (87-113)/(43-62) 96/52 mmHg (08/23 0500) SpO2:  [89 %-100 %] 97 % (08/23 0530) Arterial Line BP: (77-131)/(41-62) 89/51 mmHg (08/23 0530)  Intake/Output from previous day: 08/22 0701 - 08/23 0700 In: 2104.6 [P.O.:680; I.V.:1074.6; IV Piggyback:350] Out: 1830 [Urine:1790; Chest Tube:40]  Physical Exam: Pt is alert and oriented, NAD HEENT: normal Neck: JVP - normal Lungs: CTA bilaterally CV: RRR without murmur or gallop Abd: soft, NT, Positive BS, no hepatomegaly Ext: diffuse 1+ edema, distal pulses intact and equal Skin: warm/dry no rash  Lab Results:  Basename 06/26/12 0402 06/25/12 1702 06/25/12 1654  WBC 10.9* -- 12.8*  HGB 8.9* 8.2* --  PLT 101* -- 112*    Basename 06/26/12 0402 06/25/12 1702 06/25/12 0433  NA 136 139 --  K 3.7 3.8 --  CL 104 102 --  CO2 25 -- 24  GLUCOSE 148* 147* --  BUN 14 16 --  CREATININE 0.79 1.00 --   No results found for this basename: TROPONINI:2,CK,MB:2 in the last 72 hours  Tele: personally reviewed. Sinus rhythm.  Assessment/Plan:  1. Unstable angina - multivessel CAD. Stable post-op from CABG. Progressing well. Appreciate Dr Dennie Maizes care. Pt on ASA and high-dose lipitor. Progressing well - no arrhythmia. Will follow.   Edward Fisher, M.D. 06/26/2012, 6:51 AM

## 2012-06-27 ENCOUNTER — Inpatient Hospital Stay (HOSPITAL_COMMUNITY): Payer: Self-pay

## 2012-06-27 LAB — TYPE AND SCREEN: Unit division: 0

## 2012-06-27 LAB — GLUCOSE, CAPILLARY: Glucose-Capillary: 144 mg/dL — ABNORMAL HIGH (ref 70–99)

## 2012-06-27 LAB — BASIC METABOLIC PANEL
CO2: 24 mEq/L (ref 19–32)
Calcium: 8.9 mg/dL (ref 8.4–10.5)
Glucose, Bld: 132 mg/dL — ABNORMAL HIGH (ref 70–99)
Sodium: 138 mEq/L (ref 135–145)

## 2012-06-27 LAB — CBC
HCT: 27.2 % — ABNORMAL LOW (ref 39.0–52.0)
Hemoglobin: 9 g/dL — ABNORMAL LOW (ref 13.0–17.0)
MCH: 29.3 pg (ref 26.0–34.0)
RBC: 3.07 MIL/uL — ABNORMAL LOW (ref 4.22–5.81)

## 2012-06-27 LAB — HEMOGLOBIN A1C
Hgb A1c MFr Bld: 5.4 % (ref <5.7)
Mean Plasma Glucose: 108 mg/dL (ref <117)

## 2012-06-27 IMAGING — CR DG CHEST 2V
2 series · 2 of 2 positions shown · non-contrast
Comparison: [DATE]

CLINICAL DATA: Post CABG.

CHEST - 2 VIEW

[w chest pa]
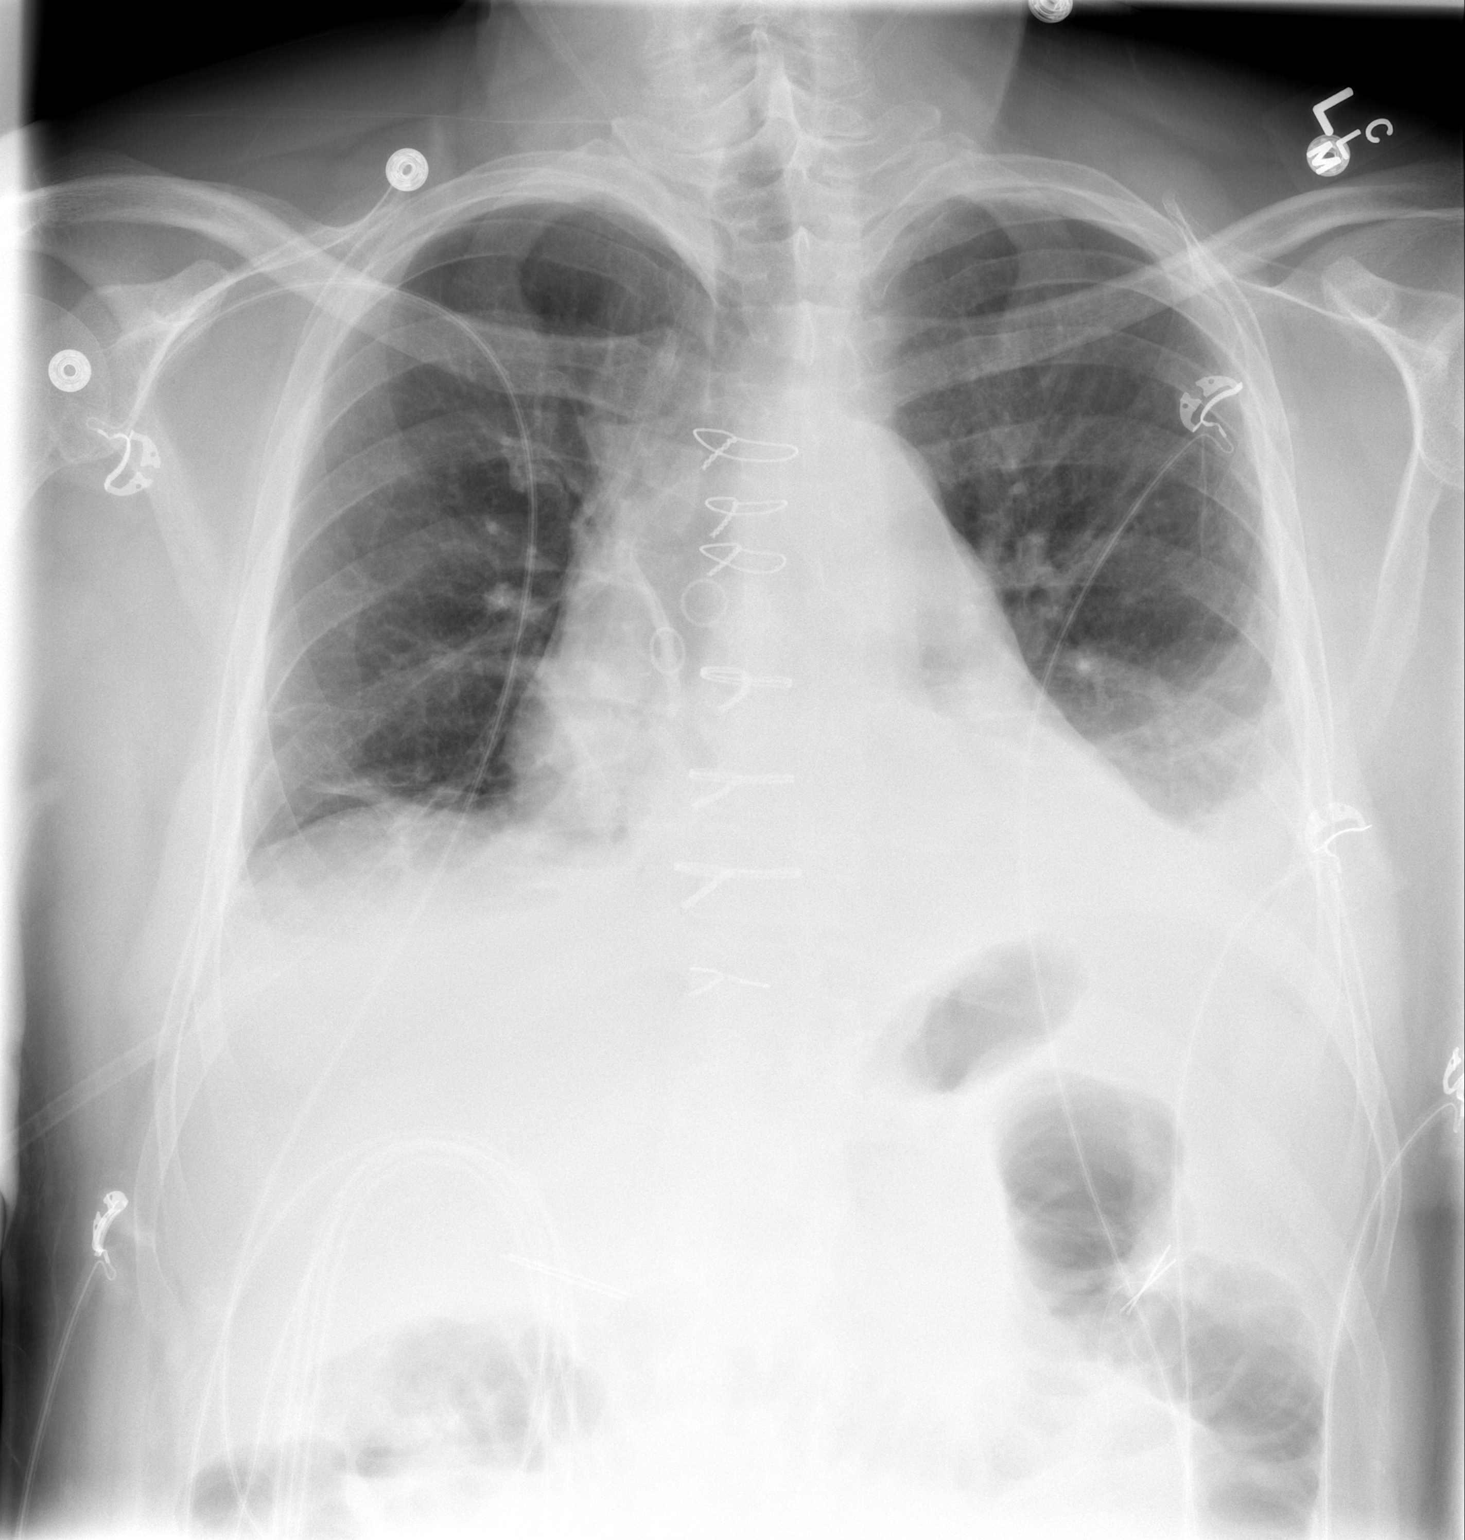

[w chest lat]
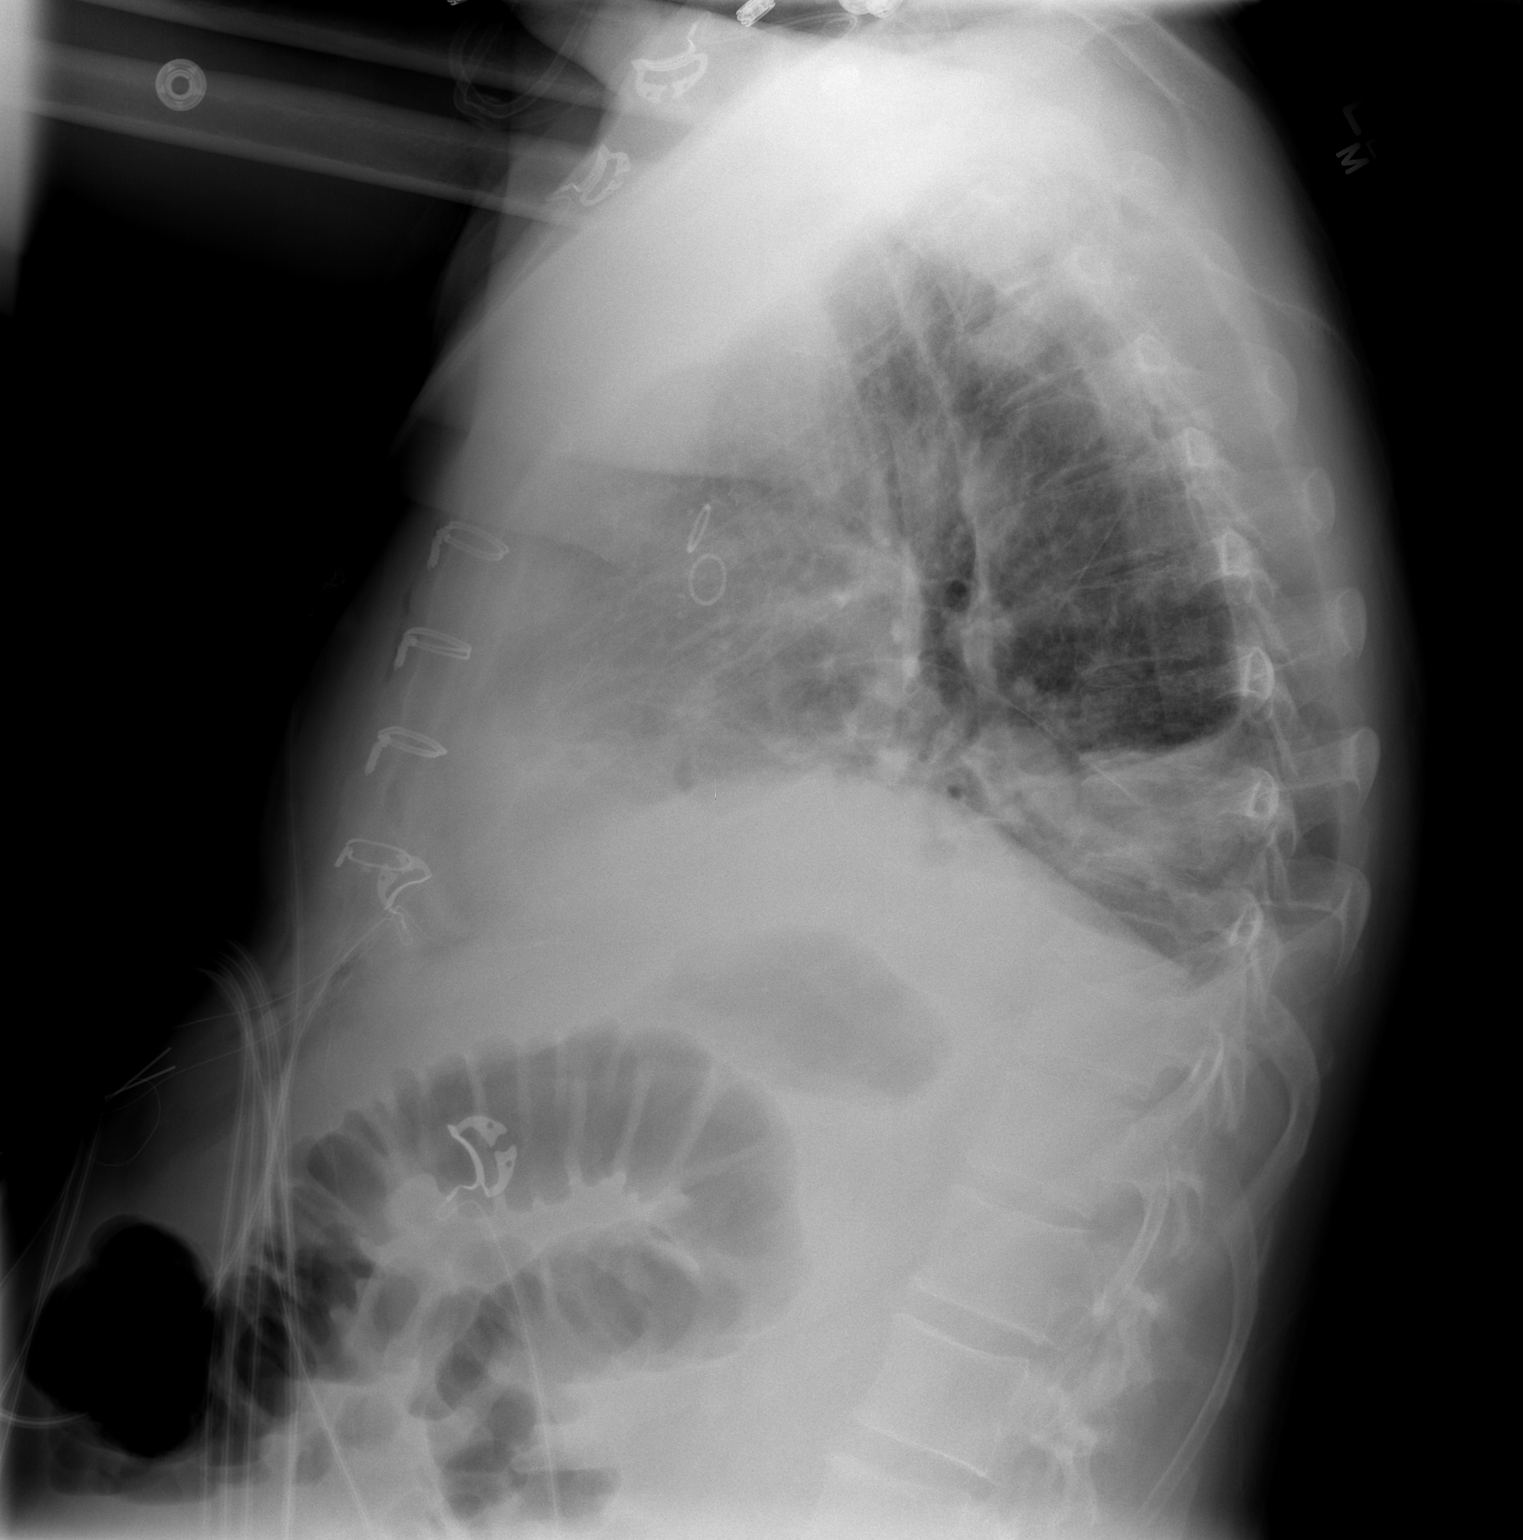

[2 of 2 positions shown; findings below may reference images not displayed]

FINDINGS: The right jugular central lines have been removed.  There
is improved aeration in the left lung.  There are persistent
basilar densities suggestive for pleural fluid and atelectasis.
Heart size appears unchanged with median sternotomy wires present.
Linear densities in the right hilum are suggestive for atelectasis.
IMPRESSION: Bibasilar atelectasis and bilateral pleural effusions, left side
greater than right.

## 2012-06-27 MED ORDER — POTASSIUM CHLORIDE CRYS ER 20 MEQ PO TBCR
20.0000 meq | EXTENDED_RELEASE_TABLET | Freq: Once | ORAL | Status: AC
  Administered 2012-06-27: 20 meq via ORAL

## 2012-06-27 MED ORDER — METOPROLOL TARTRATE 25 MG PO TABS
25.0000 mg | ORAL_TABLET | Freq: Two times a day (BID) | ORAL | Status: DC
  Administered 2012-06-27 – 2012-06-29 (×5): 25 mg via ORAL
  Filled 2012-06-27 (×6): qty 1

## 2012-06-27 MED ORDER — ENOXAPARIN SODIUM 30 MG/0.3ML ~~LOC~~ SOLN
30.0000 mg | SUBCUTANEOUS | Status: DC
  Administered 2012-06-27 – 2012-06-28 (×2): 30 mg via SUBCUTANEOUS
  Filled 2012-06-27 (×3): qty 0.3

## 2012-06-27 MED ORDER — INSULIN ASPART 100 UNIT/ML ~~LOC~~ SOLN
0.0000 [IU] | Freq: Three times a day (TID) | SUBCUTANEOUS | Status: DC
  Administered 2012-06-27 (×2): 2 [IU] via SUBCUTANEOUS

## 2012-06-27 NOTE — Progress Notes (Addendum)
3 Days Post-Op Procedure(s) (LRB): CORONARY ARTERY BYPASS GRAFTING (CABG) (N/A)  Subjective: Patient feeling fairly well. Only complaint is some numbness along left thumb (s/p radial artery harvest).  Objective: Vital signs in last 24 hours: Patient Vitals for the past 24 hrs:  BP Temp Temp src Pulse Resp SpO2 Weight  06/27/12 0657 - 99.5 F (37.5 C) Oral - - - -  06/27/12 0441 118/56 mmHg 100.7 F (38.2 C) Oral 105  24  92 % 210 lb 1.6 oz (95.301 kg)  06/26/12 2100 - - - - - 94 % -  06/26/12 2046 107/59 mmHg 100 F (37.8 C) Oral 119  19  90 % -  06/26/12 1700 - - - - - 93 % -  06/26/12 1423 103/58 mmHg 100.3 F (37.9 C) Oral 102  18  96 % -  06/26/12 1415 - - - - - 91 % -  06/26/12 1400 - - - - - 83 % -  06/26/12 1234 122/70 mmHg 98.8 F (37.1 C) Oral 101  24  91 % -  06/26/12 1200 - - - 90  18  92 % -  06/26/12 1121 - 98.3 F (36.8 C) Oral - - - -  06/26/12 1100 - - - 97  21  92 % -  06/26/12 1000 - - - 100  22  90 % -   Pre op weight  94 kg Current Weight  06/27/12 210 lb 1.6 oz (95.301 kg)      Intake/Output from previous day: 08/23 0701 - 08/24 0700 In: 380 [P.O.:360; I.V.:20] Out: 350 [Urine:350]   Physical Exam:  Cardiovascular: Slightly diminished, no murmurs, gallops, or rubs. Pulmonary: Diminished at bases; no rales, wheezes, or rhonchi. Abdomen: Soft, non tender, slight distention,bowel sounds present. Extremities: Mild bilateral lower extremity edema. Some numbness left thumb. Wounds: Dressings are clean and dry.   Neurologic: grossly intact without focal deficits;numbness left thumb  Lab Results: CBC:  Basename 06/27/12 0534 06/26/12 0402  WBC 11.6* 10.9*  HGB 9.0* 8.9*  HCT 27.2* 26.1*  PLT 121* 101*   BMET:   Basename 06/27/12 0534 06/26/12 0402  NA 138 136  K 3.8 3.7  CL 104 104  CO2 24 25  GLUCOSE 132* 148*  BUN 19 14  CREATININE 0.86 0.79  CALCIUM 8.9 8.7    PT/INR:  Lab Results  Component Value Date   INR 1.50* 06/24/2012   INR 1.00 06/22/2012   ABG:  INR: Will add last result for INR, ABG once components are confirmed Will add last 4 CBG results once components are confirmed  Assessment/Plan:  1. CV - SR. Continue Imdur 15 mg daily.Increase Lopressor to 25 bid as HR into 100's. 2.  Pulmonary - Encourage incentive spirometer.CXR this am shows bilateral pleural effusions (L>R) and bibasilar atelectasis. 3. Volume Overload - Diurese. 4.  Acute blood loss anemia - H and H stable at 9 and 27.2. 5.Supplement potassium. 6.Thrombocytopenia-platelets 101,000. 7. Await HGA1C results.CBGs 121/136/143.Continue SS PRN. 8.Thrombocytopenia-platelets increased to 121,000.  ZIMMERMAN,DONIELLE MPA-C 06/27/2012 9:22 AM  Poss home Monday Improving, still needs some diuresis I have seen and examined [name] and agree with the above assessment  and plan.  Delight Ovens MD Beeper [phone] Office [phone] 06/27/2012 11:27 AM

## 2012-06-27 NOTE — Progress Notes (Addendum)
CARDIAC REHAB PHASE I   PRE:  Rate/Rhythm: 104 sinus rhythm  BP:  Supine:   Sitting: 119/55  Standing:    SaO2: 99% room air  MODE:  Ambulation: 550 ft   POST:  Rate/Rhythem: 124 sinus rhythm  BP:  Supine:   Sitting: 122/61  Standing:    SaO2: 98% room air  Pt ambulated in hallway X1 assist rolling walker, steady gait without difficulty.  Pt tolerated well.  Pt to chair,  call light in reach.   Education completed.  Pt oriented to outpatient cardiac rehab.  Referral will be sent to [facility] at pt request.    Dan Europe

## 2012-06-28 MED ORDER — METOPROLOL TARTRATE 25 MG PO TABS: 25.0000 mg | ORAL_TABLET | Freq: Two times a day (BID) | ORAL | Status: DC

## 2012-06-28 MED ORDER — OXYCODONE HCL 5 MG PO TABS: 5.0000 mg | ORAL_TABLET | ORAL | Status: DC | PRN

## 2012-06-28 MED ORDER — ATORVASTATIN CALCIUM 80 MG PO TABS: 80.0000 mg | ORAL_TABLET | Freq: Every day | ORAL | Status: DC

## 2012-06-28 MED ORDER — POTASSIUM CHLORIDE CRYS ER 20 MEQ PO TBCR: 20.0000 meq | EXTENDED_RELEASE_TABLET | Freq: Every day | ORAL | Status: DC

## 2012-06-28 MED ORDER — FUROSEMIDE 40 MG PO TABS: 40.0000 mg | ORAL_TABLET | Freq: Every day | ORAL | Status: DC

## 2012-06-28 MED ORDER — FOLIC ACID 1 MG PO TABS: 1.0000 mg | ORAL_TABLET | Freq: Every day | ORAL | Status: DC

## 2012-06-28 MED ORDER — ASPIRIN 325 MG PO TBEC: 325.0000 mg | DELAYED_RELEASE_TABLET | Freq: Every day | ORAL | Status: AC

## 2012-06-28 MED ORDER — ISOSORBIDE MONONITRATE 15 MG HALF TABLET: 15.0000 mg | ORAL_TABLET | Freq: Every day | ORAL | Status: DC

## 2012-06-28 MED ORDER — LACTULOSE 10 GM/15ML PO SOLN
20.0000 g | Freq: Once | ORAL | Status: AC
  Administered 2012-06-28: 20 g via ORAL
  Filled 2012-06-28: qty 30

## 2012-06-28 NOTE — Progress Notes (Addendum)
4 Days Post-Op Procedure(s) (LRB): CORONARY ARTERY BYPASS GRAFTING (CABG) (N/A)  Subjective: Patient feeling fairly well. Only complaint is some numbness along left thumb (s/p radial artery harvest).Only other complaint is not much appetite-denies nausea, vomiting, or abdominal pain.  Objective: Vital signs in last 24 hours: Patient Vitals for the past 24 hrs:  BP Temp Temp src Pulse Resp SpO2 Weight  06/28/12 0409 126/74 mmHg 99.1 F (37.3 C) Oral 92  18  98 % 204 lb (92.534 kg)  06/27/12 1942 123/65 mmHg 98.9 F (37.2 C) Oral 111  20  94 % -  06/27/12 1700 121/65 mmHg 98.2 F (36.8 C) Oral 101  20  96 % -  06/27/12 0951 122/61 mmHg - - 108  - - -   Pre op weight  94 kg Current Weight  06/28/12 204 lb (92.534 kg)     Intake/Output from previous day:    Physical Exam:  Cardiovascular: RRR, no murmurs, gallops, or rubs. Pulmonary: Diminished at bases; no rales, wheezes, or rhonchi. Abdomen: Soft, non tender, slight distention,bowel sounds present. Extremities: Mild bilateral lower extremity edema. Some numbness left thumb. Wounds: Dressings are clean and dry.   Neurologic: grossly intact without focal deficits;numbness left thumb  Lab Results: CBC:  Basename 06/27/12 0534 06/26/12 0402  WBC 11.6* 10.9*  HGB 9.0* 8.9*  HCT 27.2* 26.1*  PLT 121* 101*   BMET:   Basename 06/27/12 0534 06/26/12 0402  NA 138 136  K 3.8 3.7  CL 104 104  CO2 24 25  GLUCOSE 132* 148*  BUN 19 14  CREATININE 0.86 0.79  CALCIUM 8.9 8.7    PT/INR:  Lab Results  Component Value Date   INR 1.50* 06/24/2012   INR 1.00 06/22/2012   ABG:  INR: Will add last result for INR, ABG once components are confirmed Will add last 4 CBG results once components are confirmed  Assessment/Plan:  1. CV - SR. Continue Imdur 15 mg daily, Lopressor  25 bid. 2.  Pulmonary - Encourage incentive spirometer. 3. Volume Overload - Diurese. 4.  Acute blood loss anemia - H and H stable at 9 and  27.2. 5.Thrombocytopenia-platelets 101,000. 7. HGA1C pre op 4.4.Stopped CBGs and SS PRN. 8.Thrombocytopenia-platelets increased to 121,000. 9.Remove EPW today. 10.Probable discharge in am.  ZIMMERMAN,DONIELLE MPA-C 06/28/2012 7:50 AM  Plan d/c in am I have seen and examined Edward Fisher and agree with the above assessment  and plan.  Delight Ovens MD Beeper [phone] Office [phone] 06/28/2012 11:44 AM

## 2012-06-28 NOTE — Progress Notes (Signed)
06/28/2012 11:46 AM Nursing note epw d/c per orders and per protocol. Ends intact. Pt. Tolerated well. Frequent vital signs collected per protocol. Call bell within reach. Advised bed rest for one hour. Will continue to monitor.  Tanajah Boulter, Blanchard Kelch

## 2012-06-28 NOTE — Progress Notes (Signed)
06/28/2012 1:05 PM Nursing note Pt. Bedrest over. Pt. Transferred to room 2039 per prior orders. Pt. Ambulated to new room approximately 150 ft on RA and with RW. Pt. Tolerated well. RN at bedside to receive pt. Oncoming Rn updated on status of frequent vital signs. Wife at bedside to be made aware of transfer.  Anicka Stuckert, Blanchard Kelch

## 2012-06-29 MED ORDER — TRAMADOL HCL 50 MG PO TABS: 50.0000 mg | ORAL_TABLET | Freq: Four times a day (QID) | ORAL | Status: AC | PRN

## 2012-06-29 MED ORDER — ALPRAZOLAM 0.25 MG PO TABS: 0.2500 mg | ORAL_TABLET | Freq: Every evening | ORAL | Status: DC | PRN

## 2012-06-29 NOTE — Progress Notes (Signed)
5 Days Post-Op Procedure(s) (LRB): CORONARY ARTERY BYPASS GRAFTING (CABG) (N/A)  Subjective: Patient feeling fairly well. Having a lot of gas this am.  Objective: Vital signs in last 24 hours: Patient Vitals for the past 24 hrs:  BP Temp Temp src Pulse Resp SpO2 Weight  06/29/12 0415 122/61 mmHg 98.7 F (37.1 C) Oral 87  19  95 % 198 lb 12.8 oz (90.175 kg)  06/28/12 2006 129/63 mmHg 99.3 F (37.4 C) Oral 98  19  95 % -  06/28/12 1344 109/57 mmHg 98 F (36.7 C) Oral 91  18  98 % -  06/28/12 1310 101/54 mmHg 98.2 F (36.8 C) - 86  - 96 % -  06/28/12 1246 98/62 mmHg - - 83  - - -  06/28/12 1230 101/62 mmHg - - 83  - - -  06/28/12 1215 94/55 mmHg - - 85  - - -  06/28/12 1200 105/64 mmHg - - 94  - - -  06/28/12 1145 103/61 mmHg - - 104  - - -  06/28/12 1023 110/61 mmHg - - 99  - - -  06/28/12 0934 98/53 mmHg - - 102  - - -  06/28/12 0925 101/55 mmHg - - 99  - - -   Pre op weight  94 kg Current Weight  06/29/12 198 lb 12.8 oz (90.175 kg)     Intake/Output from previous day: 08/25 0701 - 08/26 0700 In: -  Out: 801 [Urine:800; Stool:1]  Physical Exam:  Cardiovascular: RRR, no murmurs, gallops, or rubs. Pulmonary: Diminished at bases; no rales, wheezes, or rhonchi. Abdomen: Soft, non tender, slight distention,bowel sounds present. Extremities: Mild bilateral lower extremity edema. Some numbness left thumb. Wounds: Dressings are clean and dry.   Neurologic: grossly intact without focal deficits;numbness left thumb  Lab Results: CBC:  Basename 06/27/12 0534  WBC 11.6*  HGB 9.0*  HCT 27.2*  PLT 121*   BMET:   Basename 06/27/12 0534  NA 138  K 3.8  CL 104  CO2 24  GLUCOSE 132*  BUN 19  CREATININE 0.86  CALCIUM 8.9    PT/INR:  Lab Results  Component Value Date   INR 1.50* 06/24/2012   INR 1.00 06/22/2012   ABG:  INR: Will add last result for INR, ABG once components are confirmed Will add last 4 CBG results once components are  confirmed  Assessment/Plan:  1. CV - SR. Continue Imdur 15 mg daily, Lopressor  25 bid. 2.  Pulmonary - Encourage incentive spirometer. 3. Volume Overload - Diurese. 4.  Acute blood loss anemia - H and H stable at 9 and 27.2. 5.Thrombocytopenia-platelets 101,000. 7. HGA1C pre op 4.4.Stopped CBGs and SS PRN. 8.Thrombocytopenia-platelets increased to 121,000. 9.Remove CT sutures. 10.Discharge today.  ZIMMERMAN,DONIELLE MPA-C 06/29/2012

## 2012-06-29 NOTE — Progress Notes (Signed)
    Subjective:  Feels pretty good except for 'gas' and pain in his right thumb with movement.  Objective:  Vital Signs in the last 24 hours: Temp:  [98 F (36.7 C)-99.3 F (37.4 C)] 98.7 F (37.1 C) (08/26 0415) Pulse Rate:  [83-104] 87  (08/26 0415) Resp:  [18-19] 19  (08/26 0415) BP: (94-129)/(53-64) 122/61 mmHg (08/26 0415) SpO2:  [95 %-98 %] 95 % (08/26 0415) Weight:  [198 lb 12.8 oz (90.175 kg)] 198 lb 12.8 oz (90.175 kg) (08/26 0415)  Intake/Output from previous day: 08/25 0701 - 08/26 0700 In: -  Out: 801 [Urine:800; Stool:1]  Physical Exam: Pt is alert and oriented, NAD No edema Abdomen soft, NT  Lab Results:  [facility] 06/27/12 0534  WBC 11.6*  HGB 9.0*  PLT 121*    Basename 06/27/12 0534  NA 138  K 3.8  CL 104  CO2 24  GLUCOSE 132*  BUN 19  CREATININE 0.86   No results found for this basename: TROPONINI:2,CK,MB:2 in the last 72 hours  Tele: Sinus rhythm, no arrhythmia. Personally reviewed.  Assessment/Plan:  1. Unstable angina with multivessel CAD 2. Hyperlipidemia  Stable and ready for discharge from CV perspective. Home today on ASA, high-dose atorvastatin, and metoprolol. Will arrange follow-up with Dr Antoine Poche in our [place] office.  [name], M.D. 06/29/2012, 8:43 AM

## 2012-06-29 NOTE — Progress Notes (Signed)
Patient d/c home with wife, d/c instruction and education done, all questions and concerns addressed . Taken out on wheel chair.

## 2012-06-30 ENCOUNTER — Encounter (HOSPITAL_COMMUNITY): Payer: Self-pay

## 2012-06-30 MED FILL — Sodium Chloride Irrigation Soln 0.9%: Qty: 3000 | Status: AC

## 2012-06-30 MED FILL — Heparin Sodium (Porcine) Inj 1000 Unit/ML: INTRAMUSCULAR | Qty: 10 | Status: AC

## 2012-06-30 MED FILL — Sodium Bicarbonate IV Soln 8.4%: INTRAVENOUS | Qty: 50 | Status: AC

## 2012-06-30 MED FILL — Mannitol IV Soln 20%: INTRAVENOUS | Qty: 500 | Status: AC

## 2012-06-30 MED FILL — Sodium Chloride IV Soln 0.9%: INTRAVENOUS | Qty: 1000 | Status: AC

## 2012-06-30 MED FILL — Electrolyte-R (PH 7.4) Solution: INTRAVENOUS | Qty: 6000 | Status: AC

## 2012-06-30 MED FILL — Heparin Sodium (Porcine) Inj 1000 Unit/ML: INTRAMUSCULAR | Qty: 30 | Status: AC

## 2012-06-30 MED FILL — Lidocaine HCl IV Inj 20 MG/ML: INTRAVENOUS | Qty: 5 | Status: AC

## 2012-07-13 NOTE — Addendum Note (Signed)
Addendum  created 07/13/12 1349 by Edmonia Caprio, CRNA   Modules edited:Anesthesia Events, Notes Section

## 2012-07-13 NOTE — Addendum Note (Signed)
Addendum  created 07/13/12 1349 by Cornelis Kluver M Korde Jeppsen, CRNA   Modules edited:Anesthesia Events, Notes Section    

## 2012-07-15 ENCOUNTER — Encounter: Payer: Self-pay | Admitting: *Deleted

## 2012-07-15 ENCOUNTER — Encounter: Payer: Self-pay | Admitting: Cardiology

## 2012-07-15 ENCOUNTER — Ambulatory Visit: Payer: Self-pay | Admitting: Physician Assistant

## 2012-07-15 ENCOUNTER — Ambulatory Visit (INDEPENDENT_AMBULATORY_CARE_PROVIDER_SITE_OTHER): Payer: Self-pay | Admitting: Cardiology

## 2012-07-15 VITALS — BP 112/80 | HR 68 | Ht 70.0 in | Wt 198.0 lb

## 2012-07-15 DIAGNOSIS — Z951 Presence of aortocoronary bypass graft: Secondary | ICD-10-CM

## 2012-07-15 DIAGNOSIS — E785 Hyperlipidemia, unspecified: Secondary | ICD-10-CM

## 2012-07-15 DIAGNOSIS — I7090 Unspecified atherosclerosis: Secondary | ICD-10-CM

## 2012-07-15 DIAGNOSIS — I2510 Atherosclerotic heart disease of native coronary artery without angina pectoris: Secondary | ICD-10-CM

## 2012-07-15 MED ORDER — PRAVASTATIN SODIUM 80 MG PO TABS: 80.0000 mg | ORAL_TABLET | Freq: Every day | ORAL | Status: DC

## 2012-07-15 MED ORDER — TRAMADOL HCL 50 MG PO TABS: 50.0000 mg | ORAL_TABLET | Freq: Four times a day (QID) | ORAL | Status: DC | PRN

## 2012-07-15 NOTE — Progress Notes (Deleted)
Name: Edward Fisher    DOB: 02/03/1966  Age: 46 y.o.  MR#: [id number]       PCP:  No primary provider on file.      Insurance: @PAYORNAME @   CC:   No chief complaint on file.   VS BP 112/80  Pulse 68  Ht 5\' 10"  (1.778 m)  Wt 198 lb (89.812 kg)  BMI 28.41 kg/m2  SpO2 99%  Weights Current Weight  07/15/12 198 lb (89.812 kg)  06/29/12 198 lb 12.8 oz (90.175 kg)  06/29/12 198 lb 12.8 oz (90.175 kg)    Blood Pressure  BP Readings from Last 3 Encounters:  07/15/12 112/80  06/29/12 122/61  06/29/12 122/61     Admit date:  (Not on file) Last encounter with RMR:  06/22/2012   Allergy No Known Allergies  Current Outpatient Prescriptions  Medication Sig Dispense Refill  . aspirin 81 MG tablet Take 81 mg by mouth daily.      [name] atorvastatin (LIPITOR) 80 MG tablet Take 1 tablet (80 mg total) by mouth daily at 6 PM.  30 tablet  1  . docusate sodium (COLACE) 100 MG capsule Take 100 mg by mouth daily as needed.      . folic acid (FOLVITE) 1 MG tablet Take 1 tablet (1 mg total) by mouth daily. For one month then stop.  30 tablet  0  . isosorbide mononitrate (IMDUR) 15 mg TB24 Take 0.5 tablets (15 mg total) by mouth daily.  30 tablet  0  . metoprolol tartrate (LOPRESSOR) 25 MG tablet Take 1 tablet (25 mg total) by mouth 2 (two) times daily.  60 tablet  1  . Omega-3 Fatty Acids (FISH OIL) 500 MG CAPS Take 2 capsules by mouth daily.        . traMADol (ULTRAM) 50 MG tablet Take 50 mg by mouth every 6 (six) hours as needed.        Discontinued Meds:    Medications Discontinued During This Encounter  Medication Reason  . ALPRAZolam (XANAX) 0.25 MG tablet Discontinued by provider  . furosemide (LASIX) 40 MG tablet Completed Course  . potassium chloride SA (K-DUR,KLOR-CON) 20 MEQ tablet Discontinued by provider  . ranitidine (ZANTAC) 150 MG tablet Discontinued by provider    Patient Active Problem List  Diagnosis  . Hyperlipidemia  . Unstable angina  . CAD in native artery  . S/P CABG  x 4    LABS Admission on 06/22/2012, Discharged on 06/29/2012  No results displayed because visit has over 200 results.    Admission on 06/21/2012, Discharged on 06/21/2012  Component Date Value  . Troponin I 06/21/2012 <0.30   . WBC 06/21/2012 8.6   . RBC 06/21/2012 5.08   . Hemoglobin 06/21/2012 15.2   . HCT 06/21/2012 44.8   . MCV 06/21/2012 88.2   . [facility] 06/21/2012 29.9   . MCHC 06/21/2012 33.9   . RDW 06/21/2012 12.3   . Platelets 06/21/2012 179   . Neutrophils Relative 06/21/2012 53   . Neutro Abs 06/21/2012 4.5   . Lymphocytes Relative 06/21/2012 33   . Lymphs Abs 06/21/2012 2.8   . Monocytes Relative 06/21/2012 10   . Monocytes Absolute 06/21/2012 0.8   . Eosinophils Relative 06/21/2012 3   . Eosinophils Absolute 06/21/2012 0.3   . Basophils Relative 06/21/2012 1   . Basophils Absolute 06/21/2012 0.1   . Sodium 06/21/2012 139   . Potassium 06/21/2012 4.5   . Chloride 06/21/2012 106   . CO2  06/21/2012 25   . Glucose, Bld 06/21/2012 100*  . BUN 06/21/2012 15   . Creatinine, Ser 06/21/2012 1.12   . Calcium 06/21/2012 9.7   . GFR calc non Af Amer 06/21/2012 76*  . GFR calc Af Amer 06/21/2012 88*  . Color, Urine 06/21/2012 YELLOW   . APPearance 06/21/2012 CLEAR   . Specific Gravity, Urine 06/21/2012 1.015   . pH 06/21/2012 7.0   . Glucose, UA 06/21/2012 NEGATIVE   . Hgb urine dipstick 06/21/2012 NEGATIVE   . Bilirubin Urine 06/21/2012 NEGATIVE   . Ketones, ur 06/21/2012 NEGATIVE   . Protein, ur 06/21/2012 NEGATIVE   . Urobilinogen, UA 06/21/2012 1.0   . Nitrite 06/21/2012 NEGATIVE   . Leukocytes, UA 06/21/2012 NEGATIVE      Results for this Opt Visit:     Results for orders placed during the hospital encounter of 06/22/12  CBC      Component Value Range   WBC 8.7  4.0 - 10.5 K/uL   RBC 5.45  4.22 - 5.81 MIL/uL   Hemoglobin 16.3  13.0 - 17.0 g/dL   HCT 09.8  11.9 - 14.7 %   MCV 88.6  78.0 - 100.0 fL   MCH 29.9  26.0 - 34.0 pg   MCHC 33.7  30.0 - 36.0  g/dL   RDW 82.9  56.2 - 13.0 %   Platelets 189  150 - 400 K/uL  CREATININE, SERUM      Component Value Range   Creatinine, Ser 1.03  0.50 - 1.35 mg/dL   GFR calc non Af Amer 84 (*) >90 mL/min   GFR calc Af Amer >90  >90 mL/min  TSH      Component Value Range   TSH 1.561  0.350 - 4.500 uIU/mL  CARDIAC PANEL(CRET KIN+CKTOT+MB+TROPI)      Component Value Range   Total CK 38  7 - 232 U/L   CK, MB 1.5  0.3 - 4.0 ng/mL   Troponin I <0.30  <0.30 ng/mL   Relative Index RELATIVE INDEX IS INVALID  0.0 - 2.5  LIPID PANEL      Component Value Range   Cholesterol 207 (*) 0 - 200 mg/dL   Triglycerides 865 (*) <150 mg/dL   HDL 30 (*) >78 mg/dL   Total CHOL/HDL Ratio 6.9     VLDL 37  0 - 40 mg/dL   LDL Cholesterol 469 (*) 0 - 99 mg/dL  PROTIME-INR      Component Value Range   Prothrombin Time 13.4  11.6 - 15.2 seconds   INR 1.00  0.00 - 1.49  HEPARIN LEVEL (UNFRACTIONATED)      Component Value Range   Heparin Unfractionated 0.15 (*) 0.30 - 0.70 IU/mL  TYPE AND SCREEN      Component Value Range   ABO/RH(D) B POS     Antibody Screen NEG     Sample Expiration 06/26/2012     Unit Number [id number]     Blood Component Type RED CELLS,LR     Unit division 00     Status of Unit REL FROM [facility]     Transfusion Status OK TO TRANSFUSE     Crossmatch Result Compatible     Unit Number [id number]     Blood Component Type RED CELLS,LR     Unit division 00     Status of Unit REL FROM [facility]     Transfusion Status OK TO TRANSFUSE     Crossmatch Result Compatible  Unit Number [id number]     Blood Component Type RED CELLS,LR     Unit division 00     Status of Unit REL FROM [facility]     Transfusion Status OK TO TRANSFUSE     Crossmatch Result Compatible     Unit Number [id number]     Blood Component Type RED CELLS,LR     Unit division 00     Status of Unit REL FROM [facility]     Transfusion Status OK TO TRANSFUSE     Crossmatch Result Compatible    ABO/RH      Component Value Range    ABO/RH(D) B POS    BASIC METABOLIC PANEL      Component Value Range   Sodium 139  135 - 145 mEq/L   Potassium 3.9  3.5 - 5.1 mEq/L   Chloride 107  96 - 112 mEq/L   CO2 22  19 - 32 mEq/L   Glucose, Bld 171 (*) 70 - 99 mg/dL   BUN 14  6 - 23 mg/dL   Creatinine, Ser 9.62  0.50 - 1.35 mg/dL   Calcium 9.6  8.4 - 95.2 mg/dL   GFR calc non Af Amer >90  >90 mL/min   GFR calc Af Amer >90  >90 mL/min  CBC      Component Value Range   WBC 9.1  4.0 - 10.5 K/uL   RBC 5.48  4.22 - 5.81 MIL/uL   Hemoglobin 16.4  13.0 - 17.0 g/dL   HCT 84.1  32.4 - 40.1 %   MCV 87.2  78.0 - 100.0 fL   MCH 29.9  26.0 - 34.0 pg   MCHC 34.3  30.0 - 36.0 g/dL   RDW 02.7  25.3 - 66.4 %   Platelets 177  150 - 400 K/uL  HEPARIN LEVEL (UNFRACTIONATED)      Component Value Range   Heparin Unfractionated 0.24 (*) 0.30 - 0.70 IU/mL  BASIC METABOLIC PANEL      Component Value Range   Sodium 142  135 - 145 mEq/L   Potassium 4.2  3.5 - 5.1 mEq/L   Chloride 110  96 - 112 mEq/L   CO2 22  19 - 32 mEq/L   Glucose, Bld 125 (*) 70 - 99 mg/dL   BUN 15  6 - 23 mg/dL   Creatinine, Ser 4.03  0.50 - 1.35 mg/dL   Calcium 9.9  8.4 - 47.4 mg/dL   GFR calc non Af Amer 85 (*) >90 mL/min   GFR calc Af Amer >90  >90 mL/min  PULMONARY FUNCTION TEST      Component Value Range   FEV1       FVC       FEV1/FVC       TLC       DLCO      HEMOGLOBIN AND HEMATOCRIT, BLOOD      Component Value Range   Hemoglobin 8.7 (*) 13.0 - 17.0 g/dL   HCT 25.9 (*) 56.3 - 87.5 %  PLATELET COUNT      Component Value Range   Platelets 173  150 - 400 K/uL  POCT I-STAT 4, (NA,K, GLUC, HGB,HCT)      Component Value Range   Sodium 141  135 - 145 mEq/L   Potassium 4.1  3.5 - 5.1 mEq/L   Glucose, Bld 124 (*) 70 - 99 mg/dL   HCT 64.3  32.9 - 51.8 %   Hemoglobin 15.3  13.0 - 17.0 g/dL  POCT I-STAT GLUCOSE      Component Value Range   Operator id [id number]     Glucose, Bld 124 (*) 70 - 99 mg/dL  POCT I-STAT 4, (NA,K, GLUC, HGB,HCT)      Component Value  Range   Sodium 141  135 - 145 mEq/L   Potassium 4.2  3.5 - 5.1 mEq/L   Glucose, Bld 118 (*) 70 - 99 mg/dL   HCT 04.5  40.9 - 81.1 %   Hemoglobin 14.3  13.0 - 17.0 g/dL  POCT I-STAT 4, (NA,K, GLUC, HGB,HCT)      Component Value Range   Sodium 136  135 - 145 mEq/L   Potassium 4.1  3.5 - 5.1 mEq/L   Glucose, Bld 89  70 - 99 mg/dL   HCT 91.4 (*) 78.2 - 95.6 %   Hemoglobin 6.8 (*) 13.0 - 17.0 g/dL  POCT I-STAT 4, (NA,K, GLUC, HGB,HCT)      Component Value Range   Sodium 136  135 - 145 mEq/L   Potassium 5.2 (*) 3.5 - 5.1 mEq/L   Glucose, Bld 93  70 - 99 mg/dL   HCT 21.3 (*) 08.6 - 57.8 %   Hemoglobin 7.5 (*) 13.0 - 17.0 g/dL  POCT I-STAT 3, BLOOD GAS (G3+)      Component Value Range   pH, Arterial 7.375  7.350 - 7.450   pCO2 arterial 39.5  35.0 - 45.0 mmHg   pO2, Arterial 342.0 (*) 80.0 - 100.0 mmHg   Bicarbonate 23.1  20.0 - 24.0 mEq/L   TCO2 24  0 - 100 mmol/L   O2 Saturation 100.0     Acid-base deficit 2.0  0.0 - 2.0 mmol/L   Sample type ARTERIAL    POCT I-STAT 3, BLOOD GAS (G3+)      Component Value Range   pH, Arterial 7.352  7.350 - 7.450   pCO2 arterial 39.1  35.0 - 45.0 mmHg   pO2, Arterial 41.0 (*) 80.0 - 100.0 mmHg   Bicarbonate 21.7  20.0 - 24.0 mEq/L   TCO2 23  0 - 100 mmol/L   O2 Saturation 74.0     Acid-base deficit 4.0 (*) 0.0 - 2.0 mmol/L   Sample type ARTERIAL    POCT ACTIVATED CLOTTING TIME      Component Value Range   Activated Clotting Time 359    POCT I-STAT 4, (NA,K, GLUC, HGB,HCT)      Component Value Range   Sodium 138  135 - 145 mEq/L   Potassium 6.0 (*) 3.5 - 5.1 mEq/L   Glucose, Bld 165 (*) 70 - 99 mg/dL   HCT 46.9 (*) 62.9 - 52.8 %   Hemoglobin 8.2 (*) 13.0 - 17.0 g/dL  POCT I-STAT 3, BLOOD GAS (G3+)      Component Value Range   pH, Arterial 7.406  7.350 - 7.450   pCO2 arterial 36.8  35.0 - 45.0 mmHg   pO2, Arterial 174.0 (*) 80.0 - 100.0 mmHg   Bicarbonate 23.0  20.0 - 24.0 mEq/L   TCO2 24  0 - 100 mmol/L   O2 Saturation 100.0      Acid-base deficit 1.0  0.0 - 2.0 mmol/L   Patient temperature 37.3 C     Sample type ARTERIAL    POCT I-STAT 4, (NA,K, GLUC, HGB,HCT)      Component Value Range   Sodium 140  135 - 145 mEq/L   Potassium 4.1  3.5 - 5.1 mEq/L   Glucose, Bld 159 (*)  70 - 99 mg/dL   HCT 16.1 (*) 09.6 - 04.5 %   Hemoglobin 8.8 (*) 13.0 - 17.0 g/dL  CBC      Component Value Range   WBC 15.3 (*) 4.0 - 10.5 K/uL   RBC 3.41 (*) 4.22 - 5.81 MIL/uL   Hemoglobin 9.9 (*) 13.0 - 17.0 g/dL   HCT 40.9 (*) 81.1 - 91.4 %   MCV 86.5  78.0 - 100.0 fL   MCH 29.0  26.0 - 34.0 pg   MCHC 33.6  30.0 - 36.0 g/dL   RDW 78.2  95.6 - 21.3 %   Platelets 122 (*) 150 - 400 K/uL  PROTIME-INR      Component Value Range   Prothrombin Time 18.4 (*) 11.6 - 15.2 seconds   INR 1.50 (*) 0.00 - 1.49  APTT      Component Value Range   aPTT 30  24 - 37 seconds  MRSA PCR SCREENING      Component Value Range   MRSA by PCR NEGATIVE  NEGATIVE  POCT I-STAT 4, (NA,K, GLUC, HGB,HCT)      Component Value Range   Sodium 144  135 - 145 mEq/L   Potassium 3.5  3.5 - 5.1 mEq/L   Glucose, Bld 140 (*) 70 - 99 mg/dL   HCT 08.6 (*) 57.8 - 46.9 %   Hemoglobin 11.9 (*) 13.0 - 17.0 g/dL  POCT I-STAT 3, BLOOD GAS (G3+)      Component Value Range   pH, Arterial 7.429  7.350 - 7.450   pCO2 arterial 33.0 (*) 35.0 - 45.0 mmHg   pO2, Arterial 69.0 (*) 80.0 - 100.0 mmHg   Bicarbonate 22.2  20.0 - 24.0 mEq/L   TCO2 23  0 - 100 mmol/L   O2 Saturation 95.0     Acid-base deficit 2.0  0.0 - 2.0 mmol/L   Patient temperature 35.4 C     Collection site RADIAL, ALLEN'S TEST ACCEPTABLE     Drawn by Operator     Sample type ARTERIAL    CBC      Component Value Range   WBC 12.6 (*) 4.0 - 10.5 K/uL   RBC 3.34 (*) 4.22 - 5.81 MIL/uL   Hemoglobin 9.8 (*) 13.0 - 17.0 g/dL   HCT 62.9 (*) 52.8 - 41.3 %   MCV 87.1  78.0 - 100.0 fL   MCH 29.3  26.0 - 34.0 pg   MCHC 33.7  30.0 - 36.0 g/dL   RDW 24.4  01.0 - 27.2 %   Platelets 135 (*) 150 - 400 K/uL  BASIC  METABOLIC PANEL      Component Value Range   Sodium 139  135 - 145 mEq/L   Potassium 4.1  3.5 - 5.1 mEq/L   Chloride 108  96 - 112 mEq/L   CO2 24  19 - 32 mEq/L   Glucose, Bld 142 (*) 70 - 99 mg/dL   BUN 12  6 - 23 mg/dL   Creatinine, Ser 5.36  0.50 - 1.35 mg/dL   Calcium 8.2 (*) 8.4 - 10.5 mg/dL   GFR calc non Af Amer >90  >90 mL/min   GFR calc Af Amer >90  >90 mL/min  MAGNESIUM      Component Value Range   Magnesium 2.5  1.5 - 2.5 mg/dL  CBC      Component Value Range   WBC 11.1 (*) 4.0 - 10.5 K/uL   RBC 2.88 (*) 4.22 - 5.81 MIL/uL  Hemoglobin 8.5 (*) 13.0 - 17.0 g/dL   HCT 11.9 (*) 14.7 - 82.9 %   MCV 87.5  78.0 - 100.0 fL   MCH 29.5  26.0 - 34.0 pg   MCHC 33.7  30.0 - 36.0 g/dL   RDW 56.2  13.0 - 86.5 %   Platelets 125 (*) 150 - 400 K/uL  MAGNESIUM      Component Value Range   Magnesium 1.5  1.5 - 2.5 mg/dL  CREATININE, SERUM      Component Value Range   Creatinine, Ser 0.40 (*) 0.50 - 1.35 mg/dL   GFR calc non Af Amer >90  >90 mL/min   GFR calc Af Amer >90  >90 mL/min  GLUCOSE, CAPILLARY      Component Value Range   Glucose-Capillary 100 (*) 70 - 99 mg/dL  GLUCOSE, CAPILLARY      Component Value Range   Glucose-Capillary 113 (*) 70 - 99 mg/dL  GLUCOSE, CAPILLARY      Component Value Range   Glucose-Capillary 117 (*) 70 - 99 mg/dL  GLUCOSE, CAPILLARY      Component Value Range   Glucose-Capillary 92  70 - 99 mg/dL  GLUCOSE, CAPILLARY      Component Value Range   Glucose-Capillary 87  70 - 99 mg/dL  POCT I-STAT 3, BLOOD GAS (G3+)      Component Value Range   pH, Arterial 7.299 (*) 7.350 - 7.450   pCO2 arterial 54.2 (*) 35.0 - 45.0 mmHg   pO2, Arterial 119.0 (*) 80.0 - 100.0 mmHg   Bicarbonate 26.8 (*) 20.0 - 24.0 mEq/L   TCO2 28  0 - 100 mmol/L   O2 Saturation 98.0     Patient temperature 36.4 C     Collection site ARTERIAL LINE     Drawn by Operator     Sample type ARTERIAL    POCT I-STAT 3, BLOOD GAS (G3+)      Component Value Range   pH, Arterial  7.366  7.350 - 7.450   pCO2 arterial 39.2  35.0 - 45.0 mmHg   pO2, Arterial 125.0 (*) 80.0 - 100.0 mmHg   Bicarbonate 22.6  20.0 - 24.0 mEq/L   TCO2 24  0 - 100 mmol/L   O2 Saturation 99.0     Acid-base deficit 3.0 (*) 0.0 - 2.0 mmol/L   Patient temperature 36.4 C     Collection site ARTERIAL LINE     Drawn by Operator     Sample type ARTERIAL    POCT I-STAT 3, BLOOD GAS (G3+)      Component Value Range   pH, Arterial 7.337 (*) 7.350 - 7.450   pCO2 arterial 44.5  35.0 - 45.0 mmHg   pO2, Arterial 130.0 (*) 80.0 - 100.0 mmHg   Bicarbonate 23.9  20.0 - 24.0 mEq/L   TCO2 25  0 - 100 mmol/L   O2 Saturation 99.0     Acid-base deficit 2.0  0.0 - 2.0 mmol/L   Patient temperature 36.7 C     Collection site ARTERIAL LINE     Drawn by :MD     Sample type ARTERIAL    POCT I-STAT, CHEM 8      Component Value Range   Sodium 143  135 - 145 mEq/L   Potassium 4.4  3.5 - 5.1 mEq/L   Chloride 108  96 - 112 mEq/L   BUN 9  6 - 23 mg/dL   Creatinine, Ser 7.84  0.50 - 1.35 mg/dL  Glucose, Bld 130 (*) 70 - 99 mg/dL   Calcium, Ion 1.61  0.96 - 1.23 mmol/L   TCO2 23  0 - 100 mmol/L   Hemoglobin 9.5 (*) 13.0 - 17.0 g/dL   HCT 04.5 (*) 40.9 - 81.1 %  MAGNESIUM      Component Value Range   Magnesium 2.4  1.5 - 2.5 mg/dL  CBC      Component Value Range   WBC 12.8 (*) 4.0 - 10.5 K/uL   RBC 3.08 (*) 4.22 - 5.81 MIL/uL   Hemoglobin 9.2 (*) 13.0 - 17.0 g/dL   HCT 91.4 (*) 78.2 - 95.6 %   MCV 88.3  78.0 - 100.0 fL   MCH 29.9  26.0 - 34.0 pg   MCHC 33.8  30.0 - 36.0 g/dL   RDW 21.3  08.6 - 57.8 %   Platelets 112 (*) 150 - 400 K/uL  CREATININE, SERUM      Component Value Range   Creatinine, Ser 0.91  0.50 - 1.35 mg/dL   GFR calc non Af Amer >90  >90 mL/min   GFR calc Af Amer >90  >90 mL/min  GLUCOSE, CAPILLARY      Component Value Range   Glucose-Capillary 144 (*) 70 - 99 mg/dL   Comment 1 Notify RN     Comment 2 Documented in Chart    GLUCOSE, CAPILLARY      Component Value Range    Glucose-Capillary 141 (*) 70 - 99 mg/dL  GLUCOSE, CAPILLARY      Component Value Range   Glucose-Capillary 120 (*) 70 - 99 mg/dL   Comment 1 Notify RN     Comment 2 Documented in Chart    GLUCOSE, CAPILLARY      Component Value Range   Glucose-Capillary 99  70 - 99 mg/dL   Comment 1 Notify RN     Comment 2 Documented in Chart    GLUCOSE, CAPILLARY      Component Value Range   Glucose-Capillary 126 (*) 70 - 99 mg/dL   Comment 1 Documented in Chart     Comment 2 Notify RN    GLUCOSE, CAPILLARY      Component Value Range   Glucose-Capillary 131 (*) 70 - 99 mg/dL   Comment 1 Documented in Chart     Comment 2 Notify RN    GLUCOSE, CAPILLARY      Component Value Range   Glucose-Capillary 145 (*) 70 - 99 mg/dL   Comment 1 Documented in Chart     Comment 2 Notify RN    BASIC METABOLIC PANEL      Component Value Range   Sodium 136  135 - 145 mEq/L   Potassium 3.7  3.5 - 5.1 mEq/L   Chloride 104  96 - 112 mEq/L   CO2 25  19 - 32 mEq/L   Glucose, Bld 148 (*) 70 - 99 mg/dL   BUN 14  6 - 23 mg/dL   Creatinine, Ser 4.69  0.50 - 1.35 mg/dL   Calcium 8.7  8.4 - 62.9 mg/dL   GFR calc non Af Amer >90  >90 mL/min   GFR calc Af Amer >90  >90 mL/min  CBC      Component Value Range   WBC 10.9 (*) 4.0 - 10.5 K/uL   RBC 3.00 (*) 4.22 - 5.81 MIL/uL   Hemoglobin 8.9 (*) 13.0 - 17.0 g/dL   HCT 52.8 (*) 41.3 - 24.4 %   MCV 87.0  78.0 -  100.0 fL   MCH 29.7  26.0 - 34.0 pg   MCHC 34.1  30.0 - 36.0 g/dL   RDW 21.3  08.6 - 57.8 %   Platelets 101 (*) 150 - 400 K/uL  POCT I-STAT, CHEM 8      Component Value Range   Sodium 139  135 - 145 mEq/L   Potassium 3.8  3.5 - 5.1 mEq/L   Chloride 102  96 - 112 mEq/L   BUN 16  6 - 23 mg/dL   Creatinine, Ser 4.69  0.50 - 1.35 mg/dL   Glucose, Bld 629 (*) 70 - 99 mg/dL   Calcium, Ion 5.28 (*) 1.12 - 1.23 mmol/L   TCO2 22  0 - 100 mmol/L   Hemoglobin 8.2 (*) 13.0 - 17.0 g/dL   HCT 41.3 (*) 24.4 - 01.0 %  GLUCOSE, CAPILLARY      Component Value Range    Glucose-Capillary 138 (*) 70 - 99 mg/dL   Comment 1 Documented in Chart     Comment 2 Notify RN    GLUCOSE, CAPILLARY      Component Value Range   Glucose-Capillary 153 (*) 70 - 99 mg/dL  GLUCOSE, CAPILLARY      Component Value Range   Glucose-Capillary 141 (*) 70 - 99 mg/dL  GLUCOSE, CAPILLARY      Component Value Range   Glucose-Capillary 136 (*) 70 - 99 mg/dL   Comment 1 Notify RN     Comment 2 Documented in Chart    GLUCOSE, CAPILLARY      Component Value Range   Glucose-Capillary 136 (*) 70 - 99 mg/dL  GLUCOSE, CAPILLARY      Component Value Range   Glucose-Capillary 121 (*) 70 - 99 mg/dL   Comment 1 Documented in Chart     Comment 2 Notify RN    CBC      Component Value Range   WBC 11.6 (*) 4.0 - 10.5 K/uL   RBC 3.07 (*) 4.22 - 5.81 MIL/uL   Hemoglobin 9.0 (*) 13.0 - 17.0 g/dL   HCT 27.2 (*) 53.6 - 64.4 %   MCV 88.6  78.0 - 100.0 fL   MCH 29.3  26.0 - 34.0 pg   MCHC 33.1  30.0 - 36.0 g/dL   RDW 03.4  74.2 - 59.5 %   Platelets 121 (*) 150 - 400 K/uL  BASIC METABOLIC PANEL      Component Value Range   Sodium 138  135 - 145 mEq/L   Potassium 3.8  3.5 - 5.1 mEq/L   Chloride 104  96 - 112 mEq/L   CO2 24  19 - 32 mEq/L   Glucose, Bld 132 (*) 70 - 99 mg/dL   BUN 19  6 - 23 mg/dL   Creatinine, Ser 6.38  0.50 - 1.35 mg/dL   Calcium 8.9  8.4 - 75.6 mg/dL   GFR calc non Af Amer >90  >90 mL/min   GFR calc Af Amer >90  >90 mL/min  HEMOGLOBIN A1C      Component Value Range   Hemoglobin A1C 5.4  <5.7 %   Mean Plasma Glucose 108  <117 mg/dL  GLUCOSE, CAPILLARY      Component Value Range   Glucose-Capillary 136 (*) 70 - 99 mg/dL  GLUCOSE, CAPILLARY      Component Value Range   Glucose-Capillary 143 (*) 70 - 99 mg/dL  GLUCOSE, CAPILLARY      Component Value Range   Glucose-Capillary 144 (*) 70 -  99 mg/dL   Comment 1 Notify RN    GLUCOSE, CAPILLARY      Component Value Range   Glucose-Capillary 119 (*) 70 - 99 mg/dL   Comment 1 Notify RN      EKG Orders placed in  visit on 07/15/12  . EKG 12-LEAD     Prior Assessment and Plan Problem List as of 07/15/2012            Cardiology Problems   Hyperlipidemia   Unstable angina   CAD in native artery     Other   S/P CABG x 4       Imaging: Dg Chest 2 View  06/27/2012  *RADIOLOGY REPORT*  Clinical Data: Post CABG.  CHEST - 2 VIEW  Comparison: 06/26/2012  Findings: The right jugular central lines have been removed.  There is improved aeration in the left lung.  There are persistent basilar densities suggestive for pleural fluid and atelectasis. Heart size appears unchanged with median sternotomy wires present. Linear densities in the right hilum are suggestive for atelectasis.  IMPRESSION: Bibasilar atelectasis and bilateral pleural effusions, left side greater than right.   Original Report Authenticated By: Richarda Overlie, M.D.    Dg Chest 2 View  06/23/2012  *RADIOLOGY REPORT*  Clinical Data: Preop CABG.  CHEST - 2 VIEW  Comparison: 06/21/2012  Findings: Mild chronic peribronchial thickening.  Mild interstitial prominence, particularly in the lung bases.  Heart is normal size. No effusions or acute bony abnormality.  IMPRESSION: Chronic bronchitic changes and increased markings in the lung bases.  No acute findings.   Original Report Authenticated By: Cyndie Chime, M.D.    Dg Chest Port 1 View  06/26/2012  *RADIOLOGY REPORT*  Clinical Data: Postop CABG.  PORTABLE CHEST - 1 VIEW  Comparison: 06/25/2012 and 06/24/2012.  Findings: 0612 hours.  The Swan-Ganz catheter and left chest tube have been removed.  Right IJ central venous catheter and sheath remains in place.  No pneumothorax is identified.  There are persistent small pleural effusions with associated bibasilar atelectasis.  Heart size and mediastinal contours are stable.  IMPRESSION: No evidence of pneumothorax following partial support system withdrawal.  Persistent pleural effusions and bibasilar atelectasis.   Original Report Authenticated By: Gerrianne Scale, M.D.    Dg Chest Portable 1 View In Am  06/25/2012  *RADIOLOGY REPORT*  Clinical Data: Coronary artery bypass graft.  PORTABLE CHEST - 1 VIEW  Comparison: 06/24/2012  Findings: Interval extubation and removal of NG tube.  Swan-Ganz catheter and left chest tube remain in place, unchanged.  There are low lung volumes.  Bibasilar atelectasis with small effusions. Cardiomegaly with vascular congestion.  No significant change since prior study. No pneumothorax.  IMPRESSION: Interval extubation.  Otherwise no change.   Original Report Authenticated By: Cyndie Chime, M.D.    Dg Chest Portable 1 View  06/24/2012  *RADIOLOGY REPORT*  Clinical Data: Status post CABG  PORTABLE CHEST - 1 VIEW  Comparison: 06/23/2012  Findings: ET tube tip is above the carina.  There is a Swan-Ganz catheter with tip in the main pulmonary artery.  Mediastinal drain and left-sided chest tube are noted.  There is a nasogastric tube with tip in the stomach.  Lung volumes are low.  There is a small left effusion.  Atelectasis is noted in the left base.  No pneumothorax present.  IMPRESSION:  1.  Small left effusion and left base atelectasis. 2.  Low lung volumes.   Original Report Authenticated By:  Rosealee Albee, M.D.    Dg Chest Portable 1 View  06/21/2012  *RADIOLOGY REPORT*  Clinical Data: Chest pain.  PORTABLE CHEST - 1 VIEW  Comparison: None.  Findings: Trachea is midline.  Heart size is accentuated by low lung volumes and AP technique.  There may be atelectasis or scarring along the left hemidiaphragm.  Lungs are otherwise clear. No pleural fluid.  IMPRESSION: No acute findings.  Original Report Authenticated By: Reyes Ivan, M.D.     [facility] Calculation: Score not calculated. Missing: Total Cholesterol

## 2012-07-15 NOTE — Assessment & Plan Note (Addendum)
Hyperlipidemia has not been severe, and a lower potency statin drug may be suitable.  Pravastatin will be substituted for atorvastatin with a repeat lipid profile in one month.

## 2012-07-15 NOTE — Progress Notes (Signed)
Patient ID: Edward Fisher, male   DOB: 04/07/1966, 46 y.o.   MRN: [id number]  HPI: Scheduled return visit 3 weeks following uncomplicated CABG surgery.  The patient has done well postoperatively, gradually increasing activity.  He has noted scant drainage from a chest tube site.  He has modest chest discomfort taking occasional tramadol as needed.  He cannot afford to pay for atorvastatin, for which he was charged $80 for the first month.  Prior to Admission medications   Medication Sig Start Date End Date Taking? Authorizing Provider  aspirin 81 MG tablet Take 81 mg by mouth daily.   Yes Historical Provider, MD  Omega-3 Fatty Acids (FISH OIL) 500 MG CAPS Take 2 capsules by mouth daily.     Yes Historical Provider, MD  traMADol (ULTRAM) 50 MG tablet Take 1 tablet (50 mg total) by mouth every 6 (six) hours as needed. 07/15/12  Yes Kathlen Brunswick, MD  pravastatin (PRAVACHOL) 80 MG tablet Take 1 tablet (80 mg total) by mouth daily. 07/15/12 07/15/13  Kathlen Brunswick, MD   No Known Allergies    Past medical history, social history, and family history reviewed and updated.  ROS: Denies dyspnea, orthopnea, PND, fever, erythema around his wounds.  PHYSICAL EXAM: BP 112/80  Pulse 68  Ht 5\' 10"  (1.778 m)  Wt 89.812 kg (198 lb)  BMI 28.41 kg/m2  SpO2 99%  General-Well developed; no acute distress Body habitus-mildly overweight Neck-No JVD; no carotid bruits Lungs-clear lung fields; resonant to percussion; stable sternum without significant tenderness; minimal serosanguineous drainage from the chest tube site below the xiphoid Cardiovascular-normal PMI; normal S1 and S2 Abdomen-normal bowel sounds; soft and non-tender without masses or organomegaly Musculoskeletal-No deformities, no cyanosis or clubbing Neurologic-Normal cranial nerves; symmetric strength and tone Skin-Warm, surgical scars over the left forearm and right leg Extremities-distal pulses intact; no edema  EKG: Sinus bradycardia  with PACs, left atrial abnormality, nonspecific T wave abnormality.  ASSESSMENT AND PLAN:  [name], MD 07/15/2012 9:19 PM

## 2012-07-15 NOTE — Assessment & Plan Note (Signed)
Patient is doing very well following an uncomplicated CABG surgery.  Medication regime will be simplified by discontinuing Colace, folate, isosorbide mononitrate, metoprolol and furosemide.

## 2012-07-15 NOTE — Patient Instructions (Addendum)
Your physician recommends that you schedule a follow-up appointment in: 4 months  Your physician has recommended you make the following change in your medication:  1 - STOP Atorvastatin, Colace, Folate & Imdur 2 - START Pravastatin 80 mg daily  Your physician recommends that you return for lab work in: 1 month

## 2012-07-16 ENCOUNTER — Telehealth: Payer: Self-pay | Admitting: Cardiology

## 2012-07-16 ENCOUNTER — Other Ambulatory Visit: Payer: Self-pay | Admitting: Thoracic Surgery (Cardiothoracic Vascular Surgery)

## 2012-07-16 DIAGNOSIS — I2510 Atherosclerotic heart disease of native coronary artery without angina pectoris: Secondary | ICD-10-CM

## 2012-07-16 NOTE — Telephone Encounter (Signed)
Discontinue metoprolol

## 2012-07-16 NOTE — Telephone Encounter (Signed)
Pt did not know that this was not in his bag of medications, when assessed.  Please advise as to wether he needs to continue taking this, as he did not have hypertension prior to CABG, per wife.   [phone]

## 2012-07-16 NOTE — Telephone Encounter (Signed)
Please add Metoprolol 50mg  1/2 tab BID to patient's med list. Patient wants know if he is to continue taking this medicine. / tg

## 2012-07-16 NOTE — Telephone Encounter (Signed)
Wife notified.

## 2012-07-20 ENCOUNTER — Ambulatory Visit
Admission: RE | Admit: 2012-07-20 | Discharge: 2012-07-20 | Disposition: A | Discharge status: Home / Self Care | Payer: No Typology Code available for payment source | Source: Ambulatory Visit | Attending: Thoracic Surgery (Cardiothoracic Vascular Surgery) | Admitting: Thoracic Surgery (Cardiothoracic Vascular Surgery)

## 2012-07-20 ENCOUNTER — Ambulatory Visit (INDEPENDENT_AMBULATORY_CARE_PROVIDER_SITE_OTHER): Payer: Self-pay | Admitting: Physician Assistant

## 2012-07-20 VITALS — BP 111/68 | HR 82 | Resp 16 | Ht 70.0 in | Wt 194.0 lb

## 2012-07-20 DIAGNOSIS — Z09 Encounter for follow-up examination after completed treatment for conditions other than malignant neoplasm: Secondary | ICD-10-CM

## 2012-07-20 DIAGNOSIS — Z951 Presence of aortocoronary bypass graft: Secondary | ICD-10-CM

## 2012-07-20 DIAGNOSIS — I2510 Atherosclerotic heart disease of native coronary artery without angina pectoris: Secondary | ICD-10-CM

## 2012-07-20 IMAGING — CR DG CHEST 2V
2 series · 2 of 2 positions shown · non-contrast
Comparison: Chest x-ray of [DATE]

CLINICAL DATA: CABG, follow-up

CHEST - 2 VIEW

[w chest pa]
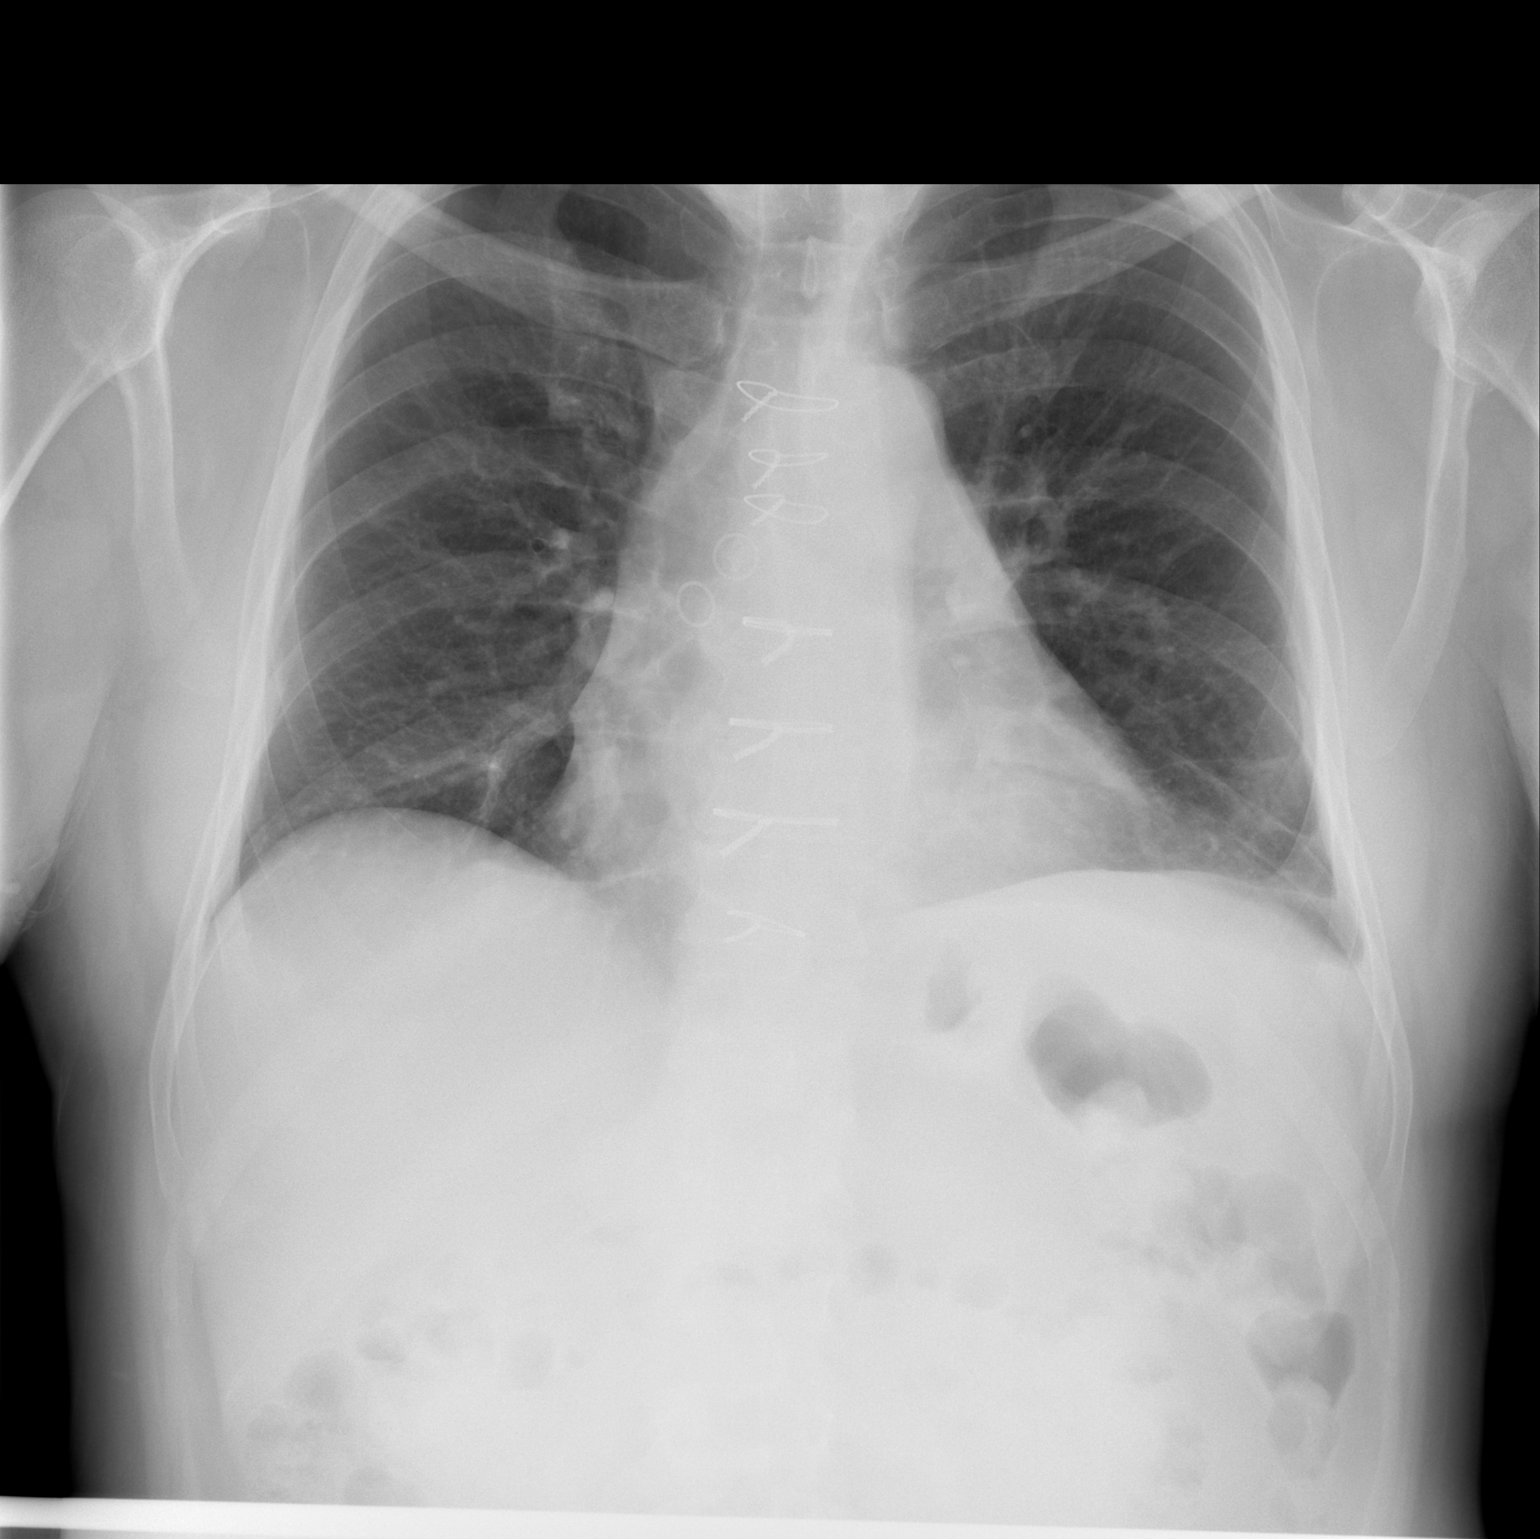

[w chest lat]
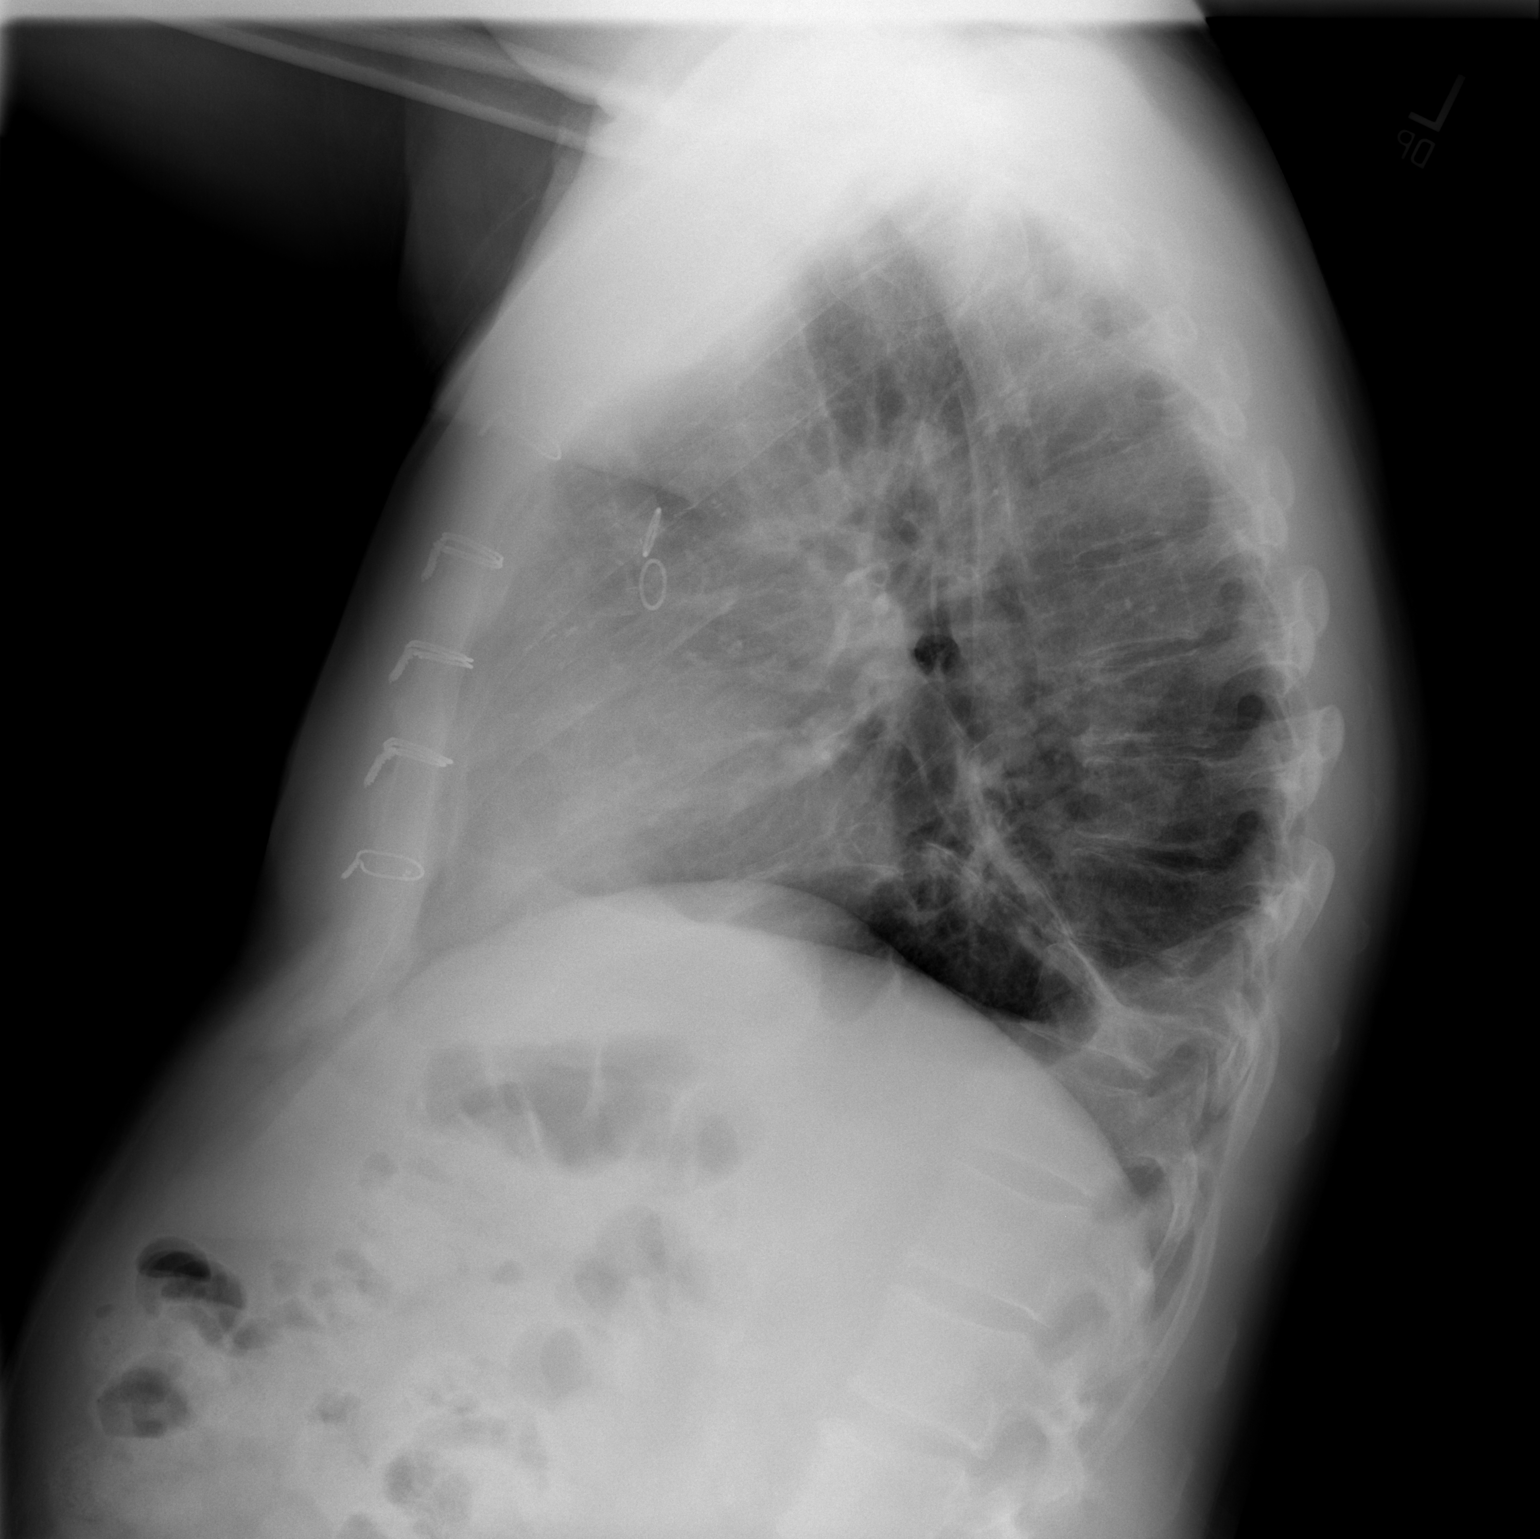

[2 of 2 positions shown; findings below may reference images not displayed]

FINDINGS: Aeration has improved with decrease in basilar
atelectasis and small effusions.  Only a small left effusion
remains.  Cardiomegaly is stable.
IMPRESSION: Improved aeration with decreasing basilar atelectasis.  Only a
small left effusion remains.

## 2012-07-20 NOTE — Progress Notes (Signed)
  HPI: Patient returns for routine postoperative follow-up having undergone CABG x 4 on 06/24/2012. The patient's early postoperative recovery while in the hospital was unremarkable. Since hospital discharge the patient reports that he has been doing well.  He denies chest pain and shortness of breath. He is ambulating without difficulty and his eating and bowel habits have returned to normal.  He does state that he has some numbness in his left hand which he states has improved since surgery.  He also states that his chest tube site was draining brown drainage.  He states they have a friend at church who is a nurse who instructed them to pack the wound and its drainage has resolved.  The patient was evaluated by Dr. Dietrich Pates last week who discontinued the patient's Lopressor due to Bradycardia.     Current Outpatient Prescriptions  Medication Sig Dispense Refill  . aspirin 325 MG EC tablet Take 325 mg by mouth daily.      . Omega-3 Fatty Acids (FISH OIL) 500 MG CAPS Take 2 capsules by mouth daily.        . pravastatin (PRAVACHOL) 80 MG tablet Take 1 tablet (80 mg total) by mouth daily.  30 tablet  11  . traMADol (ULTRAM) 50 MG tablet Take 1 tablet (50 mg total) by mouth every 6 (six) hours as needed.  30 tablet  0    Physical Exam:  BP 111/68  Pulse 82  Resp 16  Ht 5\' 10"  (1.778 m)  Wt 194 lb (87.998 kg)  BMI 27.84 kg/m2  SpO2 99%  Gen: no apparent distress Heart: RRR sternum stable Lungs: CTA bilaterally Abd: soft non-tender, non-distended Skin: sternotomy well healed, open chest tube site no drainage present, no surrounding erythema present, wound looks clean, RLE incision healing well  Diagnostic Tests:  CXR: improvement of bilateral pleural effusions/atelectasis, no evidence of pneumothorax  Impression:  Mr. Cerami is S/P CABG x4 doing very well.  He does have an open chest tube site with no evidence of infection.    Plan:  We will bring Mr. Kronberg back to see Dr. Tyrone Sage  in 1 month.  He was instructed to keep his chest tube site clean and dry.  He was also instructed that should he again develop drainage from his chest tube site he should contact our office immediately so that we can assess and make sure no infection is present.  He was instructed to continue current activity level as tolerated.

## 2012-07-23 ENCOUNTER — Encounter (HOSPITAL_COMMUNITY)
Admission: RE | Admit: 2012-07-23 | Discharge: 2012-07-23 | Disposition: A | Discharge status: Home / Self Care | Payer: Self-pay | Source: Ambulatory Visit | Attending: Cardiology | Admitting: Cardiology

## 2012-07-23 ENCOUNTER — Encounter (HOSPITAL_COMMUNITY): Payer: Self-pay

## 2012-07-23 VITALS — BP 98/62 | HR 85 | Ht 70.0 in | Wt 193.8 lb

## 2012-07-23 DIAGNOSIS — I2510 Atherosclerotic heart disease of native coronary artery without angina pectoris: Secondary | ICD-10-CM | POA: Insufficient documentation

## 2012-07-23 DIAGNOSIS — Z951 Presence of aortocoronary bypass graft: Secondary | ICD-10-CM | POA: Insufficient documentation

## 2012-07-23 DIAGNOSIS — Z5189 Encounter for other specified aftercare: Secondary | ICD-10-CM | POA: Insufficient documentation

## 2012-07-23 NOTE — Patient Instructions (Signed)
Pt has finished orientation and is scheduled to start CR on 07/27/12 at 9:30. Pt has been instructed to arrive to class 15 minutes early for scheduled class. Pt has been instructed to wear comfortable clothing and shoes with rubber soles. Pt has been told to take their medications 1 hour prior to coming to class.  If the patient is not going to attend class, he/she has been instructed to call.

## 2012-07-23 NOTE — Progress Notes (Signed)
Patient referred by Dr. Dietrich Pates due to CABG x 3 414.01. During orientation advised patient on arrival and appointment times what to wear, what to do before, during and after exercise. Reviewed attendance and class policy. Talked about inclement weather and class consultation policy. Pt is scheduled to start Cardiac Rehab on 07/27/12 at 9:30 am. Pt was advised to come to class 5 minutes before class starts. He was also given instructions on meeting with the dietician and attending the Family Structure classes. Pt is eager to get started.

## 2012-07-27 ENCOUNTER — Encounter (HOSPITAL_COMMUNITY)
Admission: RE | Admit: 2012-07-27 | Discharge: 2012-07-27 | Disposition: A | Discharge status: Home / Self Care | Payer: Self-pay | Source: Ambulatory Visit | Attending: Cardiology | Admitting: Cardiology

## 2012-07-27 ENCOUNTER — Telehealth (HOSPITAL_COMMUNITY): Payer: Self-pay | Admitting: *Deleted

## 2012-07-27 NOTE — Telephone Encounter (Signed)
Message copied by [employer] B on Mon Jul 27, 2012 11:48 AM ------      Message from: [place], [place] A      Created: Mon Jul 27, 2012 11:31 AM       Please send this to physician in a telephone note, so that he may respond to issues in chart.  Thanks a bunch Tomi                  ----- Message -----         From: Rolene Course         Sent: 07/27/2012  11:04 AM           To: Cathren Harsh, RN            Patient came to Cardiac Rehab for his 1st session. His HR was very elevated during exertion. He is not on a Blood pressure med at all. Though his BP is low and did not get really high during exercise, his HR did. Here are his values, HR 77; BP 122/70 pre exercise, HR 124; BP 140/80 1st exercise session, HR 151, BP 150/60 2nd session (on elliptical). He did very well. Just thought you should know in case you guys need to address his high HR during exertion. Thanks      Melida Northington [employer]

## 2012-07-29 ENCOUNTER — Encounter (HOSPITAL_COMMUNITY)
Admission: RE | Admit: 2012-07-29 | Discharge: 2012-07-29 | Disposition: A | Discharge status: Home / Self Care | Payer: Self-pay | Source: Ambulatory Visit | Attending: Cardiology | Admitting: Cardiology

## 2012-07-30 ENCOUNTER — Telehealth: Payer: Self-pay | Admitting: Cardiology

## 2012-07-30 NOTE — Telephone Encounter (Signed)
Patient is having cough and runny nose.  Wants to know what he can take for this. / tg

## 2012-07-30 NOTE — Telephone Encounter (Signed)
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  Verbalized understanding.

## 2012-07-31 ENCOUNTER — Encounter (HOSPITAL_COMMUNITY)
Admission: RE | Admit: 2012-07-31 | Discharge: 2012-07-31 | Disposition: A | Discharge status: Home / Self Care | Payer: Self-pay | Source: Ambulatory Visit | Attending: Cardiology | Admitting: Cardiology

## 2012-08-03 ENCOUNTER — Encounter (HOSPITAL_COMMUNITY)
Admission: RE | Admit: 2012-08-03 | Discharge: 2012-08-03 | Disposition: A | Discharge status: Home / Self Care | Payer: Self-pay | Source: Ambulatory Visit | Attending: Cardiology | Admitting: Cardiology

## 2012-08-05 ENCOUNTER — Encounter (HOSPITAL_COMMUNITY): Payer: MEDICAID

## 2012-08-07 ENCOUNTER — Encounter (HOSPITAL_COMMUNITY): Payer: MEDICAID

## 2012-08-10 ENCOUNTER — Encounter (HOSPITAL_COMMUNITY): Payer: Self-pay

## 2012-08-10 ENCOUNTER — Telehealth: Payer: Self-pay | Admitting: Cardiology

## 2012-08-10 NOTE — Telephone Encounter (Signed)
Attempted to contact patient. No answer at this time. 

## 2012-08-10 NOTE — Telephone Encounter (Signed)
Patient has questions regarding symptoms he is having. / tg

## 2012-08-12 ENCOUNTER — Encounter (HOSPITAL_COMMUNITY): Payer: MEDICAID

## 2012-08-13 NOTE — Telephone Encounter (Signed)
Patient is being treated by [facility] and had a sinus infection, which he wanted [place] to know about.

## 2012-08-14 ENCOUNTER — Encounter (HOSPITAL_COMMUNITY): Payer: MEDICAID

## 2012-08-14 ENCOUNTER — Other Ambulatory Visit: Payer: Self-pay | Admitting: *Deleted

## 2012-08-14 DIAGNOSIS — Z951 Presence of aortocoronary bypass graft: Secondary | ICD-10-CM

## 2012-08-17 ENCOUNTER — Encounter (HOSPITAL_COMMUNITY): Payer: MEDICAID

## 2012-08-19 ENCOUNTER — Encounter (HOSPITAL_COMMUNITY): Payer: MEDICAID

## 2012-08-20 ENCOUNTER — Other Ambulatory Visit: Payer: Self-pay | Admitting: Cardiothoracic Surgery

## 2012-08-20 ENCOUNTER — Ambulatory Visit (INDEPENDENT_AMBULATORY_CARE_PROVIDER_SITE_OTHER): Payer: Self-pay | Admitting: Cardiothoracic Surgery

## 2012-08-20 ENCOUNTER — Ambulatory Visit
Admission: RE | Admit: 2012-08-20 | Discharge: 2012-08-20 | Disposition: A | Discharge status: Home / Self Care | Payer: No Typology Code available for payment source | Source: Ambulatory Visit | Attending: Cardiothoracic Surgery | Admitting: Cardiothoracic Surgery

## 2012-08-20 ENCOUNTER — Encounter: Payer: Self-pay | Admitting: Cardiothoracic Surgery

## 2012-08-20 VITALS — BP 140/76 | HR 92 | Resp 20 | Ht 70.0 in | Wt 194.0 lb

## 2012-08-20 DIAGNOSIS — Z87898 Personal history of other specified conditions: Secondary | ICD-10-CM

## 2012-08-20 DIAGNOSIS — Z951 Presence of aortocoronary bypass graft: Secondary | ICD-10-CM

## 2012-08-20 DIAGNOSIS — Z789 Other specified health status: Secondary | ICD-10-CM

## 2012-08-20 IMAGING — CR DG CHEST 2V
2 series · 2 of 2 positions shown · non-contrast
Comparison: [DATE].

CLINICAL DATA: Status post coronary artery bypass graft.

CHEST - 2 VIEW

[w chest pa]
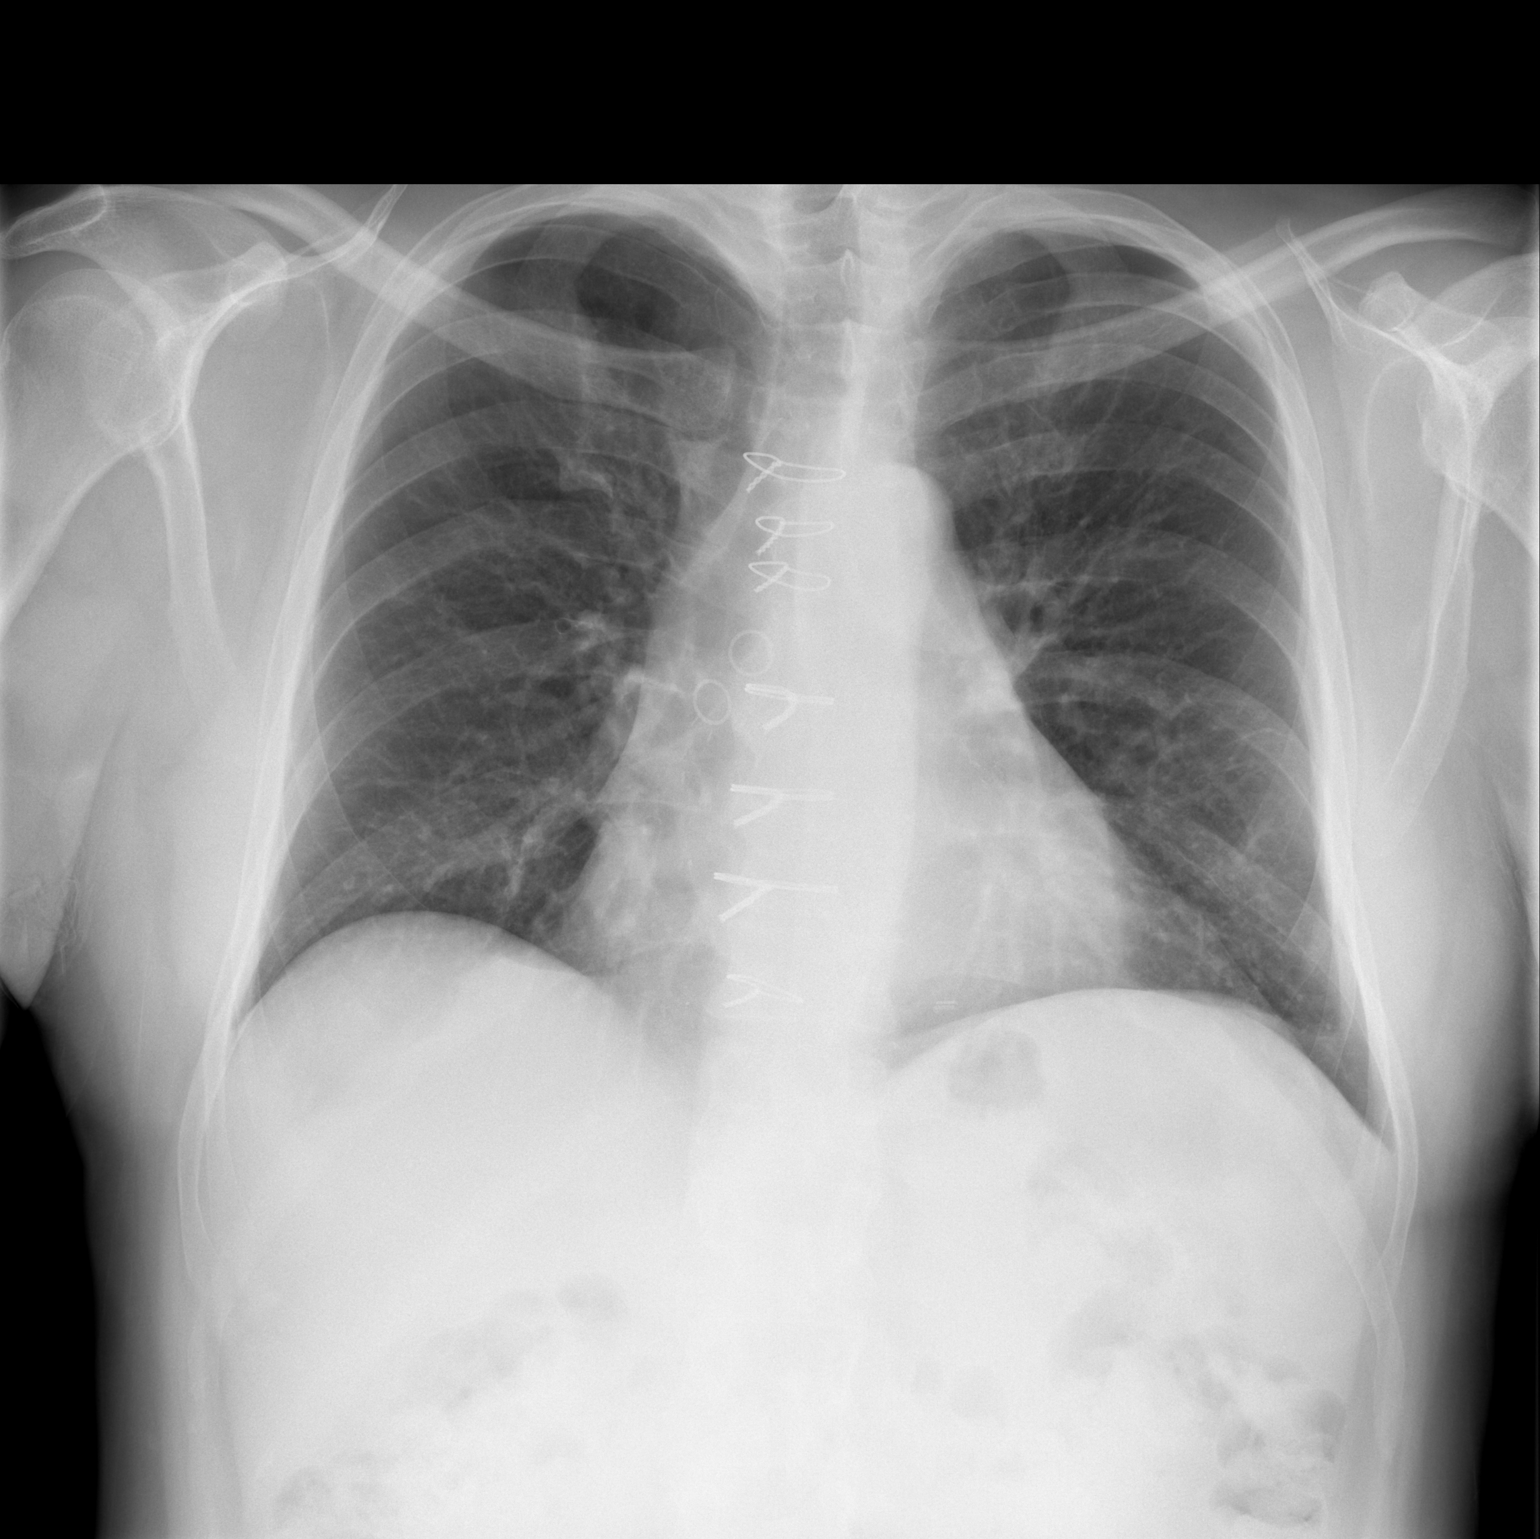

[w chest lat]
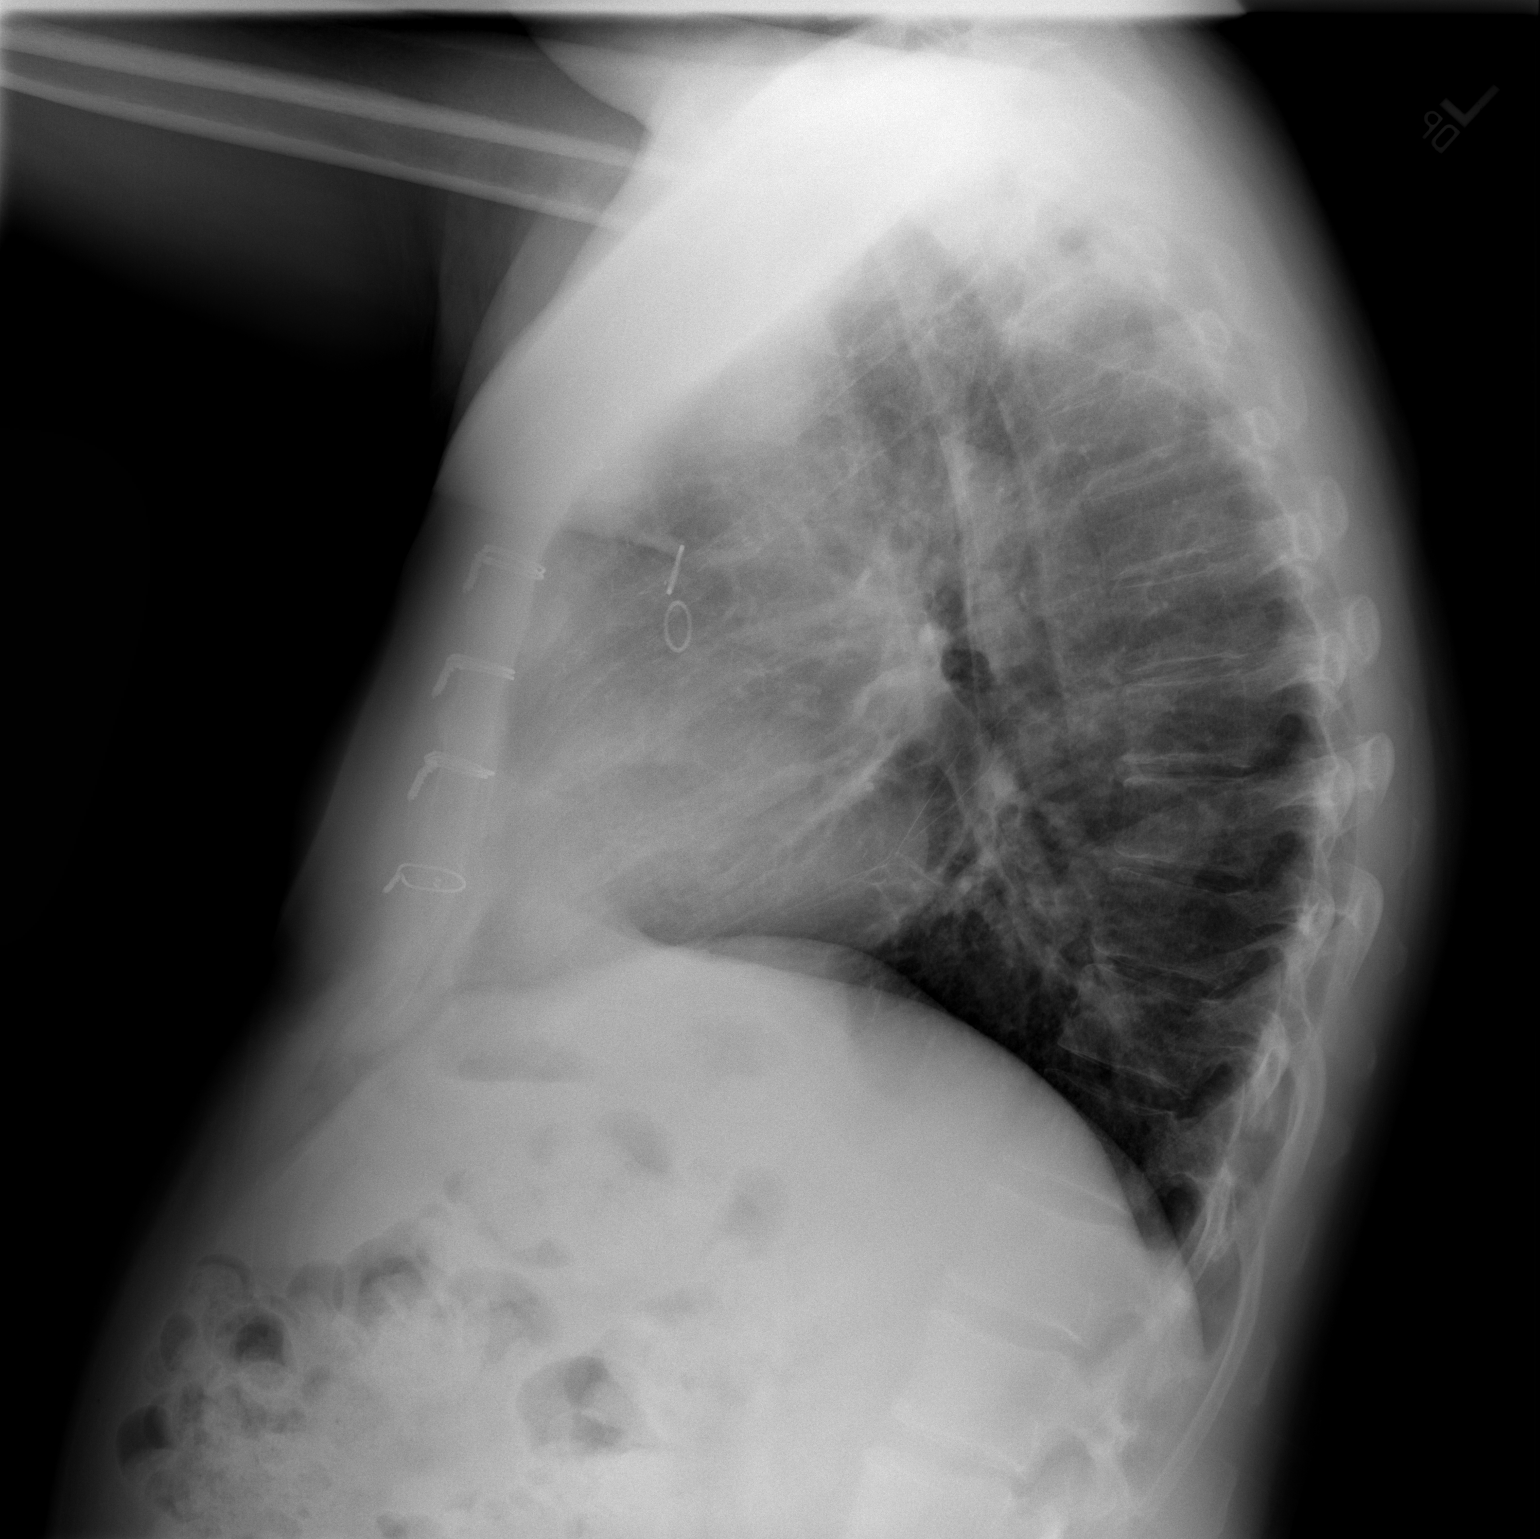

[2 of 2 positions shown; findings below may reference images not displayed]

FINDINGS: Sternotomy wires are noted.  Cardiomediastinal silhouette
appears normal.  No acute pulmonary disease is noted.  Bony thorax
is intact.
IMPRESSION: No acute cardiopulmonary abnormality seen.

## 2012-08-20 NOTE — Patient Instructions (Addendum)
May Drive No lifting over 25 lbs for 3 months    Coronary Artery Bypass Grafting Care After Refer to this sheet in the next few weeks. These instructions provide you with information on caring for yourself after your procedure. Your caregiver may also give you more specific instructions. Your treatment has been planned according to current medical practices, but problems sometimes occur. Call your caregiver if you have any problems or questions after your procedure.  Recovery from open heart surgery will be different for everyone. Some people feel well after 3 or 4 weeks, while for others it takes longer. After heart surgery, it may be normal to:  Not have an appetite, feel nauseated by the smell of food, or only want to eat a small amount.  Be constipated because of changes in your diet, activity, and medicines. Eat foods high in fiber. Add fresh fruits and vegetables to your diet. Stool softeners may be helpful.  Feel sad or unhappy. You may be frustrated or cranky. You may have good days and bad days. Do not give up. Talk to your caregiver if you do not feel better.  Feel weakness and fatigue. You many need physical therapy or cardiac rehabilitation to get your strength back.  Develop an irregular heartbeat called atrial fibrillation. Symptoms of atrial fibrillation are a fast, irregular heartbeat or feelings of fluttery heartbeats, shortness of breath, low blood pressure, and dizziness. If these symptoms develop, see your caregiver right away. MEDICATION  Have a list of all the medicines you will be taking when you leave the hospital. For every medicine, know the following:  Name.  Exact dose.  Time of day to be taken.  How often it should be taken.  Why you are taking it.  Ask which medicines should or should not be taken together. If you take more than one heart medicine, ask if it is okay to take them together. Some heart medicines should not be taken at the same time because  they may lower your blood pressure too much.  Narcotic pain medicine can cause constipation. Eat fresh fruits and vegetables. Add fiber to your diet. Stool softener medicine may help relieve constipation.  Keep a copy of your medicines with you at all times.  Do not add or stop taking any medicine until you check with your caregiver.  Medicines can have side effects. Call your caregiver who prescribed the medicine if you:  Start throwing up, have diarrhea, or have stomach pain.  Feel dizzy or lightheaded when you stand up.  Feel your heart is skipping beats or is beating too fast or too slow.  Develop a rash.  Notice unusual bruising or bleeding. HOME CARE INSTRUCTIONS  After heart surgery, it is important to learn how to take your pulse. Have your caregiver show you how to take your pulse.  Use your incentive spirometer. Ask your caregiver how long after surgery you need to use it. Care of your chest incision  Tell your caregiver right away if you notice clicking in your chest (sternum).  Support your chest with a pillow or your arms when you take deep breaths and cough.  Follow your caregiver's instructions about when you can bathe or swim.  Protect your incision from sunlight during the first year to keep the scar from getting dark.  Tell your caregiver if you notice:  Increased tenderness of your incision.  Increased redness or swelling around your incision.  Drainage or pus from your incision. Care of your leg  incision(s)  Avoid crossing your legs.  Avoid sitting for long periods of time. Change positions every half hour.  Elevate your leg(s) when you are sitting.  Check your leg(s) daily for swelling. Check the incisions for redness or drainage.  Wear your elastic stockings as told by your caregiver. Take them off at bedtime. Diet  Diet is very important to heart health.  Eat plenty of fresh fruits and vegetables. Meats should be lean cut. Avoid canned,  processed, and fried foods.  Talk to a dietician. They can teach you how to make healthy food and drink choices. Weight  Weigh yourself every day. This is important because it helps to know if you are retaining fluid that may make your heart and lungs work harder.  Use the same scale each time.  Weigh yourself every morning at the same time. You should do this after you go to the bathroom, but before you eat breakfast.  Your weight will be more accurate if you do not wear any clothes.  Record your weight.  Tell your caregiver if you have gained 2 pounds or more overnight. Activity Stop any activity at once if you have chest pain, shortness of breath, irregular heartbeats, or dizziness. Get help right away if you have any of these symptoms.  Bathing.  Avoid soaking in a bath or hot tub until your incisions are healed.  Rest. You need a balance of rest and activity.  Exercise. Exercise per your caregiver's advice. You may need physical therapy or cardiac rehabilitation to help strengthen your muscles and build your endurance.  Climbing stairs. Unless your caregiver tells you not to climb stairs, go up stairs slowly and rest if you tire. Do not pull yourself up by the handrail.  Driving a car. Follow your caregiver's advice on when you may drive. You may ride as a passenger at any time. When traveling for long periods of time in a car, get out of the car and walk around for a few minutes every 2 hours.  Lifting. Avoid lifting, pushing, or pulling anything heavier than 10 pounds for 6 weeks after surgery or as told by your caregiver.  Returning to work. Check with your caregiver. People heal at different rates. Most people will be able to go back to work 6 to 12 weeks after surgery.  Sexual activity. You may resume sexual relations as told by your caregiver. SEEK MEDICAL CARE IF:  Any of your incisions are red, painful, or have any type of drainage coming from them.  You have an  oral temperature above 102 F (38.9 C).  You have ankle or leg swelling.  You have pain in your legs.  You have weight gain of 2 or more pounds a day.  You feel dizzy or lightheaded when you stand up. SEEK IMMEDIATE MEDICAL CARE IF:  You have angina or chest pain that goes to your jaw or arms. Call your local emergency services right away.  You have shortness of breath at rest or with activity.  You have a fast or irregular heartbeat (arrhythmia).  There is a "clicking" in your sternum when you move.  You have numbness or weakness in your arms or legs. MAKE SURE YOU:  Understand these instructions.  Will watch your condition.  Will get help right away if you are not doing well or get worse. Document Released: 05/10/2005 Document Revised: 01/13/2012 Document Reviewed: 12/26/2010 [facility] Patient Information 2013 [place], [place].

## 2012-08-20 NOTE — Progress Notes (Signed)
[address]            [place] [address]          [phone]       [name] [place] Medical Record #[id number] Date of Birth: 07-Jul-1966  Edward Bollman, MD No primary provider on file.  Chief Complaint:   PostOp Follow Up Visit 06/24/2012   OPERATIVE REPORT  PREOPERATIVE DIAGNOSIS: Unstable angina with three-vessel coronary  artery disease.  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  SURGICAL PROCEDURE: Coronary artery bypass grafting x4 with the left  internal mammary to the left anterior descending coronary artery, left  radial bypass to the circumflex coronary artery, sequential reverse  saphenous vein graft to the distal right coronary artery and distal  posterior descending coronary artery with left radial artery harvesting  and right thigh endovein harvesting.  SURGEON: Sheliah Plane, MD   History of Present Illness:      Doing well post op. No angina or chf. Started Cardiac rehab, but stopped because of cost. Has questions about lipid status and treament         History  Smoking status  . Never Smoker   Smokeless tobacco  . Former Neurosurgeon  . Types: Chew  . Quit date: 11/04/1998  Comment: 06/22/2012 "quit chewing 10-15 years ago"       No Known Allergies  Current Outpatient Prescriptions  Medication Sig Dispense Refill  . aspirin 325 MG EC tablet Take 325 mg by mouth daily.      . Omega-3 Fatty Acids (FISH OIL) 500 MG CAPS Take 2 capsules by mouth daily.        . pravastatin (PRAVACHOL) 80 MG tablet Take 40 mg by mouth daily. 80 mg total per day      . traMADol (ULTRAM) 50 MG tablet Take 1 tablet (50 mg total) by mouth every 6 (six) hours as needed.  30 tablet  0  . DISCONTD: pravastatin (PRAVACHOL) 80 MG tablet Take 1 tablet (80 mg total) by mouth daily.  30 tablet  11       Physical Exam: BP 140/76  Pulse 92  Resp 20  Ht 5\' 10"  (1.778 m)  Wt 194  lb (87.998 kg)  BMI 27.84 kg/m2  SpO2 99%  General appearance: alert and cooperative Neurologic: intact Heart: regular rate and rhythm, S1, S2 normal, no murmur, click, rub or gallop and normal apical impulse Lungs: clear to auscultation bilaterally and normal percussion bilaterally Abdomen: soft, non-tender; bowel sounds normal; no masses,  no organomegaly Extremities: extremities normal, atraumatic, no cyanosis or edema and left redial havest well healed Wound: sternum stable and well healed   Diagnostic Studies & Laboratory data:         Recent Radiology Findings: Dg Chest 2 View  08/20/2012  *RADIOLOGY REPORT*  Clinical Data: Status post coronary artery bypass graft.  CHEST - 2 VIEW  Comparison: July 20, 2012.  Findings: Sternotomy wires are noted.  Cardiomediastinal silhouette appears normal.  No acute pulmonary disease is noted.  Bony thorax is intact.  IMPRESSION: No acute cardiopulmonary abnormality seen.   Original Report Authenticated By: Venita Sheffield., M.D.       Recent Labs: Lab Results  Component Value Date   WBC 11.6* 06/27/2012   HGB 9.0* 06/27/2012   HCT 27.2* 06/27/2012   PLT 121* 06/27/2012  GLUCOSE 132* 06/27/2012   CHOL 207* 06/22/2012   TRIG 185* 06/22/2012   HDL 30* 06/22/2012   LDLCALC 140* 06/22/2012   NA 138 06/27/2012   K 3.8 06/27/2012   CL 104 06/27/2012   CREATININE 0.86 06/27/2012   BUN 19 06/27/2012   CO2 24 06/27/2012   TSH 1.561 06/22/2012   INR 1.50* 06/24/2012   HGBA1C 5.4 06/27/2012      Assessment / Plan:   Doing well postop  No lifting over 25 lbs for 3 months, work involves lifting Follow up with cardiology for lipid status and treatment Will see back prn      Edward Fisher B 08/20/2012 12:31 PM

## 2012-08-21 ENCOUNTER — Encounter (HOSPITAL_COMMUNITY): Payer: MEDICAID

## 2012-08-24 ENCOUNTER — Encounter (HOSPITAL_COMMUNITY): Payer: MEDICAID

## 2012-08-26 ENCOUNTER — Encounter (HOSPITAL_COMMUNITY): Payer: MEDICAID

## 2012-08-28 ENCOUNTER — Encounter (HOSPITAL_COMMUNITY): Payer: MEDICAID

## 2012-08-31 ENCOUNTER — Encounter (HOSPITAL_COMMUNITY): Payer: MEDICAID

## 2012-08-31 DIAGNOSIS — Z0271 Encounter for disability determination: Secondary | ICD-10-CM

## 2012-09-02 ENCOUNTER — Encounter (HOSPITAL_COMMUNITY): Payer: MEDICAID

## 2012-09-04 ENCOUNTER — Encounter: Payer: Self-pay | Admitting: *Deleted

## 2012-09-04 ENCOUNTER — Encounter (HOSPITAL_COMMUNITY): Payer: MEDICAID

## 2012-09-04 ENCOUNTER — Other Ambulatory Visit: Payer: Self-pay | Admitting: *Deleted

## 2012-09-04 DIAGNOSIS — E782 Mixed hyperlipidemia: Secondary | ICD-10-CM

## 2012-09-07 ENCOUNTER — Encounter (HOSPITAL_COMMUNITY): Payer: MEDICAID

## 2012-09-09 ENCOUNTER — Encounter (HOSPITAL_COMMUNITY): Payer: MEDICAID

## 2012-09-09 LAB — CBC
HCT: 43.6 % (ref 39.0–52.0)
MCH: 25.7 pg — ABNORMAL LOW (ref 26.0–34.0)
MCV: 80.7 fL (ref 78.0–100.0)
Platelets: 235 10*3/uL (ref 150–400)
RBC: 5.4 MIL/uL (ref 4.22–5.81)

## 2012-09-09 LAB — LIPID PANEL
LDL Cholesterol: 45 mg/dL (ref 0–99)
Total CHOL/HDL Ratio: 4.2 Ratio
VLDL: 39 mg/dL (ref 0–40)

## 2012-09-11 ENCOUNTER — Encounter (HOSPITAL_COMMUNITY): Payer: MEDICAID

## 2012-09-13 ENCOUNTER — Encounter: Payer: Self-pay | Admitting: Cardiology

## 2012-09-14 ENCOUNTER — Encounter (HOSPITAL_COMMUNITY): Payer: MEDICAID

## 2012-09-14 ENCOUNTER — Encounter: Payer: Self-pay | Admitting: *Deleted

## 2012-09-16 ENCOUNTER — Encounter (HOSPITAL_COMMUNITY): Payer: MEDICAID

## 2012-09-18 ENCOUNTER — Encounter (HOSPITAL_COMMUNITY): Payer: MEDICAID

## 2012-09-21 ENCOUNTER — Encounter (HOSPITAL_COMMUNITY): Payer: MEDICAID

## 2012-09-23 ENCOUNTER — Encounter (HOSPITAL_COMMUNITY): Payer: MEDICAID

## 2012-09-25 ENCOUNTER — Encounter (HOSPITAL_COMMUNITY): Payer: MEDICAID

## 2012-09-28 ENCOUNTER — Encounter (HOSPITAL_COMMUNITY): Payer: MEDICAID

## 2012-09-30 ENCOUNTER — Encounter (HOSPITAL_COMMUNITY): Payer: MEDICAID

## 2012-10-02 ENCOUNTER — Encounter (HOSPITAL_COMMUNITY): Payer: MEDICAID

## 2012-10-05 ENCOUNTER — Encounter (HOSPITAL_COMMUNITY): Payer: MEDICAID

## 2012-10-07 ENCOUNTER — Encounter (HOSPITAL_COMMUNITY): Payer: MEDICAID

## 2012-10-09 ENCOUNTER — Encounter (HOSPITAL_COMMUNITY): Payer: MEDICAID

## 2012-10-12 ENCOUNTER — Encounter (HOSPITAL_COMMUNITY): Payer: MEDICAID

## 2012-10-14 ENCOUNTER — Encounter (HOSPITAL_COMMUNITY): Payer: MEDICAID

## 2012-10-16 ENCOUNTER — Encounter (HOSPITAL_COMMUNITY): Payer: MEDICAID

## 2012-11-16 ENCOUNTER — Encounter: Payer: Self-pay | Admitting: Cardiology

## 2012-11-17 ENCOUNTER — Ambulatory Visit (INDEPENDENT_AMBULATORY_CARE_PROVIDER_SITE_OTHER): Payer: Self-pay | Admitting: Cardiology

## 2012-11-17 ENCOUNTER — Encounter: Payer: Self-pay | Admitting: Cardiology

## 2012-11-17 VITALS — BP 130/80 | HR 97 | Ht 70.0 in | Wt 194.0 lb

## 2012-11-17 DIAGNOSIS — E785 Hyperlipidemia, unspecified: Secondary | ICD-10-CM

## 2012-11-17 DIAGNOSIS — I2510 Atherosclerotic heart disease of native coronary artery without angina pectoris: Secondary | ICD-10-CM

## 2012-11-17 DIAGNOSIS — I7090 Unspecified atherosclerosis: Secondary | ICD-10-CM

## 2012-11-17 MED ORDER — PRAVASTATIN SODIUM 40 MG PO TABS: 40.0000 mg | ORAL_TABLET | Freq: Every evening | ORAL | Status: DC

## 2012-11-17 NOTE — Patient Instructions (Addendum)
Your physician recommends that you schedule a follow-up appointment in: ONE YEAR WITH RR  Your physician has recommended you make the following change in your medication:   1) DECREASE PRAVACHOL TO 40MG  ONCE DAILY 2) DECREASE ASPIRIN TO 81MG  ONCE DAILY IN 6 MONTHS   YOUR PHYSICIAN RECOMMENDS IT IS OK TO RELEASE YOU TO RETURN TO WORK

## 2012-11-17 NOTE — Assessment & Plan Note (Addendum)
Patient's response to pravastatin therapy has been phenomenal with reduction of total and LDL cholesterol to extremely low levels; unfortunately, HDL has decreased as well. Edward Fisher is not inclined to take 2 pills per day if this is not necessary. I advised him that he could reduce his dose to 40 mg.  We discussed the role of activity and alcohol use and raising HDL. I also provided advice on cholesterol abnormalities in family members.

## 2012-11-17 NOTE — Progress Notes (Deleted)
Name: EMIL WEIGOLD    DOB: 01/15/1966  Age: 47 y.o.  MR#: [id number]       PCP:  No primary provider on file.      Insurance: @PAYORNAME @   CC:   No chief complaint on file.  MEDICATION LIST  VS BP 130/80  Pulse 97  Ht 5\' 10"  (1.778 m)  Wt 194 lb (87.998 kg)  BMI 27.84 kg/m2  SpO2 98%  Weights Current Weight  11/17/12 194 lb (87.998 kg)  08/20/12 194 lb (87.998 kg)  07/23/12 193 lb 12.6 oz (87.9 kg)    Blood Pressure  BP Readings from Last 3 Encounters:  11/17/12 130/80  08/20/12 140/76  07/23/12 98/62     Admit date:  (Not on file) Last encounter with RMR:  11/16/2012   Allergy No Known Allergies  Current Outpatient Prescriptions  Medication Sig Dispense Refill  . aspirin 325 MG EC tablet Take 325 mg by mouth daily.      . Omega-3 Fatty Acids (FISH OIL) 500 MG CAPS Take 2 capsules by mouth daily.        . pravastatin (PRAVACHOL) 80 MG tablet Take 40 mg by mouth daily. 80 mg total per day      . traMADol (ULTRAM) 50 MG tablet Take 1 tablet (50 mg total) by mouth every 6 (six) hours as needed.  30 tablet  0    Discontinued Meds:   There are no discontinued medications.  Patient Active Problem List  Diagnosis  . Hyperlipidemia  . Arteriosclerotic cardiovascular disease (ASCVD)  . History of diagnostic tests    LABS Orders Only on 09/04/2012  Component Date Value  . WBC 09/09/2012 7.6   . RBC 09/09/2012 5.40   . Hemoglobin 09/09/2012 13.9   . HCT 09/09/2012 43.6   . MCV 09/09/2012 80.7   . [facility] 09/09/2012 25.7*  . MCHC 09/09/2012 31.9   . RDW 09/09/2012 14.2   . Platelets 09/09/2012 235   . Cholesterol 09/09/2012 110   . Triglycerides 09/09/2012 195*  . HDL 09/09/2012 26*  . Total CHOL/HDL Ratio 09/09/2012 4.2   . VLDL 09/09/2012 39   . LDL Cholesterol 09/09/2012 45      Results for this Opt Visit:     Results for orders placed in visit on 09/04/12  CBC      Component Value Range   WBC 7.6  4.0 - 10.5 K/uL   RBC 5.40  4.22 - 5.81 MIL/uL   Hemoglobin 13.9  13.0 - 17.0 g/dL   HCT 40.9  81.1 - 91.4 %   MCV 80.7  78.0 - 100.0 fL   MCH 25.7 (*) 26.0 - 34.0 pg   MCHC 31.9  30.0 - 36.0 g/dL   RDW 78.2  95.6 - 21.3 %   Platelets 235  150 - 400 K/uL  LIPID PANEL      Component Value Range   Cholesterol 110  0 - 200 mg/dL   Triglycerides 086 (*) <150 mg/dL   HDL 26 (*) >57 mg/dL   Total CHOL/HDL Ratio 4.2     VLDL 39  0 - 40 mg/dL   LDL Cholesterol 45  0 - 99 mg/dL    EKG Orders placed in visit on 07/15/12  . EKG 12-LEAD     Prior Assessment and Plan Problem List as of 11/17/2012            Cardiology Problems   Hyperlipidemia   Last Assessment & Plan Note   07/15/2012  Office Visit Addendum 07/17/2012 10:54 AM by Kathlen Brunswick, MD    Hyperlipidemia has not been severe, and a lower potency statin drug may be suitable.  Pravastatin will be substituted for atorvastatin with a repeat lipid profile in one month.    Arteriosclerotic cardiovascular disease (ASCVD)   Last Assessment & Plan Note   07/15/2012 Office Visit Signed 07/15/2012  9:26 PM by Kathlen Brunswick, MD    Patient is doing very well following an uncomplicated CABG surgery.  Medication regime will be simplified by discontinuing Colace, folate, isosorbide mononitrate, metoprolol and furosemide.      Other   History of diagnostic tests       Imaging: No results found.   FRS Calculation: Score not calculated. Missing: Total Cholesterol

## 2012-11-17 NOTE — Assessment & Plan Note (Signed)
Edward Fisher is doing superbly following CABG surgery. I advised him that he can return to work but is limited to lifting no more than 75 pounds for the next few months. He will decrease his aspirin dose to use 81 mg per day in 6 months after one year of higher dose therapy.

## 2012-11-17 NOTE — Progress Notes (Signed)
Patient ID: Edward Fisher, male   DOB: [birth year], 47 y.o.   MRN: [id number] Edward Fisher  47 y.o.  male  HPI: Scheduled return visit for this very nice young gentleman with coronary artery disease who underwent CABG surgery 6 months ago. Since then, he has limited lifting but maintained a high level of aerobic activity without any cardiopulmonary symptoms. 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 Primary care has been provided by [facility] [place] where laboratory studies have been excellent.  Prior to Admission medications   Medication Sig Start Date End Date Taking? Authorizing Provider  aspirin 325 MG EC tablet Take 325 mg by mouth daily.   Yes Historical Provider, MD  Omega-3 Fatty Acids (FISH OIL) 500 MG CAPS Take 2 capsules by mouth daily.     Yes Historical Provider, MD  pravastatin (PRAVACHOL) 80 MG tablet Take 40 mg by mouth daily. 80 mg total per day 07/15/12 07/15/13 Yes Kathlen Brunswick, MD  traMADol (ULTRAM) 50 MG tablet Take 1 tablet (50 mg total) by mouth every 6 (six) hours as needed. 07/15/12  Yes Kathlen Brunswick, MD  No Known Allergies    Past medical history, social history, and family history reviewed and updated.  ROS: Denies chest pain, dyspnea, orthopnea or PND. He notes some numbness around the chest incision and minor intermittent vague discomfort as well as numbness along the thenar eminence of the left hand where a radial free graft was harvested. All other systems reviewed and are negative.  PHYSICAL EXAM: BP 130/80  Pulse 97  Ht 5\' 10"  (1.778 m)  Wt 87.998 kg (194 lb)  BMI 27.84 kg/m2  SpO2 98%  General-Well developed; no acute distress Body habitus-mildly overweight Neck-No JVD; no carotid bruits Lungs-clear lung fields; resonant to percussion; stable sternum Cardiovascular-normal PMI; normal S1 and S2; minimal systolic ejection murmur Abdomen-normal bowel sounds; soft and non-tender without masses or  organomegaly Musculoskeletal-No deformities, no cyanosis or clubbing Neurologic-Normal cranial nerves; symmetric strength and tone Skin-Warm, no significant lesions Extremities-distal pulses intact; no edema  ASSESSMENT AND PLAN:  [place] Bing, MD 11/17/2012 12:00 PM

## 2013-01-12 ENCOUNTER — Telehealth: Payer: Self-pay | Admitting: Cardiology

## 2013-01-12 NOTE — Telephone Encounter (Signed)
PLEASE ADVISE.

## 2013-01-12 NOTE — Telephone Encounter (Signed)
Wants to know if patient can be involved in "running program". / tgs

## 2013-01-12 NOTE — Telephone Encounter (Signed)
Yes. He can participate in a running program.

## 2013-01-13 NOTE — Telephone Encounter (Signed)
Spoke to pt to advise results/instructions. Pt understood.  

## 2013-03-11 ENCOUNTER — Telehealth: Payer: Self-pay | Admitting: Cardiology

## 2013-03-11 NOTE — Telephone Encounter (Signed)
Called pt to clarify, pt had surgery in august and noted the bump at that time, was advised by RR directly after surgery that this may be a wire, pt noted the bump getting bigger this week, pt denies any other sxs at this time, pt will seek ED evaluation if sxs worsen to that degree otherwise will see KL on 03-16-13, Dr RR made aware verbally and viewed pt phone notation, pt understood all instructions

## 2013-03-11 NOTE — Telephone Encounter (Signed)
PATIENT HAD OPEN HEART SURGERY, HE IS CONCERNED ABOUT A BUMP ON HIS CHEST THAT HAS BEEN GETTING WORSE AND HURTS TO THE TOUCH.\ I HAVE PT SCHEDULED FOR 5/13 WITH KATHRYN LAWRENCE.

## 2013-03-16 ENCOUNTER — Other Ambulatory Visit: Payer: Self-pay | Admitting: *Deleted

## 2013-03-16 ENCOUNTER — Ambulatory Visit (INDEPENDENT_AMBULATORY_CARE_PROVIDER_SITE_OTHER): Payer: Self-pay | Admitting: Adult Health

## 2013-03-16 ENCOUNTER — Encounter: Payer: Self-pay | Admitting: Adult Health

## 2013-03-16 VITALS — BP 128/68 | HR 76 | Ht 71.0 in | Wt 185.8 lb

## 2013-03-16 DIAGNOSIS — I2510 Atherosclerotic heart disease of native coronary artery without angina pectoris: Secondary | ICD-10-CM

## 2013-03-16 DIAGNOSIS — L089 Local infection of the skin and subcutaneous tissue, unspecified: Secondary | ICD-10-CM

## 2013-03-16 DIAGNOSIS — I7090 Unspecified atherosclerosis: Secondary | ICD-10-CM

## 2013-03-16 MED ORDER — CEPHALEXIN 500 MG PO CAPS: 500.0000 mg | ORAL_CAPSULE | Freq: Two times a day (BID) | ORAL | Status: DC

## 2013-03-16 NOTE — Assessment & Plan Note (Signed)
No recurrence of chest pain or DOE. He will continue current medication regimen.

## 2013-03-16 NOTE — Progress Notes (Deleted)
Name: Edward Fisher    DOB: [birth year]  Age: 47 y.o.  MR#: [id number]       PCP:  Willow Ora, PA-C      Insurance: Payor:  No coverage found.   CC:    Chief Complaint  Patient presents with  . Coronary Artery Disease    S/P CABG    VS Filed Vitals:   03/16/13 1048  BP: 128/68  Pulse: 76  Height: 5\' 11"  (1.803 m)  Weight: 185 lb 12.8 oz (84.278 kg)    Weights Current Weight  03/16/13 185 lb 12.8 oz (84.278 kg)  11/17/12 194 lb (87.998 kg)  08/20/12 194 lb (87.998 kg)    Blood Pressure  BP Readings from Last 3 Encounters:  03/16/13 128/68  11/17/12 130/80  08/20/12 140/76     Admit date:  (Not on file) Last encounter with RMR:  Visit date not found   Allergy Review of patient's allergies indicates no known allergies.  Current Outpatient Prescriptions  Medication Sig Dispense Refill  . aspirin 81 MG chewable tablet Chew 81 mg by mouth daily. TO START DOSAGE IN 6 MONTHS APPROXIMATELY July 2014      . Omega-3 Fatty Acids (FISH OIL) 500 MG CAPS Take 1 capsule by mouth daily.       . simvastatin (ZOCOR) 40 MG tablet Take 40 mg by mouth every evening.      . traMADol (ULTRAM) 50 MG tablet Take 1 tablet (50 mg total) by mouth every 6 (six) hours as needed.  30 tablet  0  . aspirin 325 MG EC tablet Take 325 mg by mouth daily.       No current facility-administered medications for this visit.    Discontinued Meds:    Medications Discontinued During This Encounter  Medication Reason  . pravastatin (PRAVACHOL) 40 MG tablet Error    Patient Active Problem List   Diagnosis Date Noted  . History of diagnostic tests 11/16/2012  . Arteriosclerotic cardiovascular disease (ASCVD) 06/26/2012  . Hyperlipidemia     LABS    Component Value Date/Time   NA 138 06/27/2012 0534   NA 136 06/26/2012 0402   NA 139 06/25/2012 1702   K 3.8 06/27/2012 0534   K 3.7 06/26/2012 0402   K 3.8 06/25/2012 1702   CL 104 06/27/2012 0534   CL 104 06/26/2012 0402   CL 102 06/25/2012 1702    CO2 24 06/27/2012 0534   CO2 25 06/26/2012 0402   CO2 24 06/25/2012 0433   GLUCOSE 132* 06/27/2012 0534   GLUCOSE 148* 06/26/2012 0402   GLUCOSE 147* 06/25/2012 1702   BUN 19 06/27/2012 0534   BUN 14 06/26/2012 0402   BUN 16 06/25/2012 1702   CREATININE 0.86 06/27/2012 0534   CREATININE 0.79 06/26/2012 0402   CREATININE 1.00 06/25/2012 1702   CALCIUM 8.9 06/27/2012 0534   CALCIUM 8.7 06/26/2012 0402   CALCIUM 8.2* 06/25/2012 0433   GFRNONAA >90 06/27/2012 0534   GFRNONAA >90 06/26/2012 0402   GFRNONAA >90 06/25/2012 1654   GFRAA >90 06/27/2012 0534   GFRAA >90 06/26/2012 0402   GFRAA >90 06/25/2012 1654   CMP     Component Value Date/Time   NA 138 06/27/2012 0534   K 3.8 06/27/2012 0534   CL 104 06/27/2012 0534   CO2 24 06/27/2012 0534   GLUCOSE 132* 06/27/2012 0534   BUN 19 06/27/2012 0534   CREATININE 0.86 06/27/2012 0534   CALCIUM 8.9 06/27/2012 0534   GFRNONAA >  90 06/27/2012 0534   GFRAA >90 06/27/2012 0534       Component Value Date/Time   WBC 7.6 09/09/2012 0910   WBC 11.6* 06/27/2012 0534   WBC 10.9* 06/26/2012 0402   HGB 13.9 09/09/2012 0910   HGB 9.0* 06/27/2012 0534   HGB 8.9* 06/26/2012 0402   HCT 43.6 09/09/2012 0910   HCT 27.2* 06/27/2012 0534   HCT 26.1* 06/26/2012 0402   MCV 80.7 09/09/2012 0910   MCV 88.6 06/27/2012 0534   MCV 87.0 06/26/2012 0402    Lipid Panel     Component Value Date/Time   CHOL 110 09/09/2012 0910   TRIG 195* 09/09/2012 0910   HDL 26* 09/09/2012 0910   CHOLHDL 4.2 09/09/2012 0910   VLDL 39 09/09/2012 0910   LDLCALC 45 09/09/2012 0910    ABG    Component Value Date/Time   PHART 7.337* 06/24/2012 2000   PCO2ART 44.5 06/24/2012 2000   PO2ART 130.0* 06/24/2012 2000   HCO3 23.9 06/24/2012 2000   TCO2 22 06/25/2012 1702   ACIDBASEDEF 2.0 06/24/2012 2000   O2SAT 99.0 06/24/2012 2000     Lab Results  Component Value Date   TSH 1.561 06/22/2012   BNP (last 3 results) No results found for this basename: PROBNP,  in the last 8760 hours Cardiac Panel (last 3  results) No results found for this basename: CKTOTAL, CKMB, TROPONINI, RELINDX,  in the last 72 hours  Iron/TIBC/Ferritin No results found for this basename: iron, tibc, ferritin     EKG Orders placed in visit on 07/15/12  . EKG 12-LEAD     Prior Assessment and Plan Problem List as of 03/16/2013     ICD-9-CM   Hyperlipidemia   Last Assessment & Plan   11/17/2012 Office Visit Edited 11/25/2012  9:58 AM by Kathlen Brunswick, MD     Patient's response to pravastatin therapy has been phenomenal with reduction of total and LDL cholesterol to extremely low levels; unfortunately, HDL has decreased as well. Kreed is not inclined to take 2 pills per day if this is not necessary. I advised him that he could reduce his dose to 40 mg.  We discussed the role of activity and alcohol use and raising HDL. I also provided advice on cholesterol abnormalities in family members.    Arteriosclerotic cardiovascular disease (ASCVD)   Last Assessment & Plan   11/17/2012 Office Visit Written 11/17/2012 12:36 PM by Kathlen Brunswick, MD     Martino is doing superbly following CABG surgery. I advised him that he can return to work but is limited to lifting no more than 75 pounds for the next few months. He will decrease his aspirin dose to use 81 mg per day in 6 months after one year of higher dose therapy.    History of diagnostic tests       Imaging: No results found.

## 2013-03-16 NOTE — Patient Instructions (Addendum)
Your physician recommends that you schedule a follow-up appointment in: FOLLOW UP APPOINTMENT WITH GERHARDT TOMORROW PER DR [id number]  Your physician has recommended you make the following change in your medication:   1) START KEFLEX 500MG  TWICE DAILY FOR TEN DAYS

## 2013-03-16 NOTE — Progress Notes (Signed)
   HPI Edward Fisher is or EKG rotation of Dr. [place] Bing we are following for ongoing assessment and treatment of CAD status post coronary artery bypass grafting in June of 2013, and hyper cholesterolemia. He was last seen by Dr. Dietrich Pates on 11/17/2012 was doing well and was advised that he can return to work. Phone message from 03/11/2013 documented call from the patient stating that he has noted a lump in his sternal area which was questionably related to a wire which is increasing in size and became painful. He is requesting this visit for further assessment and recommendations.   Over the last week, he has noticed an enlarging reddened area left chest about 2 cms from the sternotomy incision that is well healed. It is sore and painful to touch.       No Known Allergies  Current Outpatient Prescriptions  Medication Sig Dispense Refill  . aspirin 81 MG chewable tablet Chew 81 mg by mouth daily. TO START DOSAGE IN 6 MONTHS APPROXIMATELY July 2014      . Omega-3 Fatty Acids (FISH OIL) 500 MG CAPS Take 1 capsule by mouth daily.       . simvastatin (ZOCOR) 40 MG tablet Take 40 mg by mouth every evening.      . traMADol (ULTRAM) 50 MG tablet Take 1 tablet (50 mg total) by mouth every 6 (six) hours as needed.  30 tablet  0  . aspirin 325 MG EC tablet Take 325 mg by mouth daily.       No current facility-administered medications for this visit.    Past Medical History  Diagnosis Date  . Hyperlipidemia     Not thought to warrant pharmacologic therapy  . Insomnia   . Arteriosclerotic cardiovascular disease (ASCVD)     Onset in 04/2012; CABG in 06/2012  . Nephrolithiasis     Past Surgical History  Procedure Laterality Date  . Rhinoplasty    . Tonsillectomy  ~1971  . Coronary artery bypass graft  06/24/2012    Procedure: CORONARY ARTERY BYPASS GRAFTING (CABG);  Surgeon: Delight Ovens, MD;  Location: [facility] OR;  Service: Open Heart Surgery;  Laterality: N/A;  Coronary Artery  Bypass X 4  utilizing endoscopic saphenous vein graft and  Left radial Artery Harvest     ZOX:WRUEAV of systems complete and found to be negative unless listed above PHYSICAL EXAM BP 128/68  Pulse 76  Ht 5\' 11"  (1.803 m)  Wt 185 lb 12.8 oz (84.278 kg)  BMI 25.93 kg/m2 General: Well developed, well nourished, in no acute distress Head: Eyes PERRLA, No xanthomas.   Normal cephalic and atramatic  Lungs: Clear bilaterally to auscultation and percussion. Heart: HRRR S1 S2, without MRG.  Pulses are 2+ & equal.            No carotid bruit. No JVD.  No abdominal bruits. No femoral bruits. Abdomen: Bowel sounds are positive, abdomen soft and non-tender without masses or                  Hernia's noted. Msk:  Back normal, normal gait. Normal strength and tone for age. Extremities: No clubbing, cyanosis or edema.  DP +1  1 cm round, fibrous, area with erythema (dime sized) and soreness to touch.  Neuro: Alert and oriented X 3. Psych:  Good affect, responds appropriately  EKG:NSR with sinus arrhythmia.   ASSESSMENT AND PLAN

## 2013-03-16 NOTE — Assessment & Plan Note (Signed)
1 cm area to the left of the sternotomy with soreness, erythema.Fiborus and without drainage. Will begin Keflex 500 mg po BID for 10 days. Will have him seen in Dr.Gerhardts office for follow up tomorrow for further assessment. This has been discussed with the patient who verbalizes understanding.

## 2013-03-17 ENCOUNTER — Encounter: Payer: Self-pay | Admitting: Cardiothoracic Surgery

## 2013-03-17 ENCOUNTER — Ambulatory Visit (INDEPENDENT_AMBULATORY_CARE_PROVIDER_SITE_OTHER): Payer: Self-pay | Admitting: Cardiothoracic Surgery

## 2013-03-17 ENCOUNTER — Ambulatory Visit
Admission: RE | Admit: 2013-03-17 | Discharge: 2013-03-17 | Disposition: A | Discharge status: Home / Self Care | Payer: Self-pay | Source: Ambulatory Visit | Attending: Cardiothoracic Surgery | Admitting: Cardiothoracic Surgery

## 2013-03-17 VITALS — BP 123/75 | HR 86 | Resp 16 | Ht 70.0 in | Wt 185.0 lb

## 2013-03-17 DIAGNOSIS — I2510 Atherosclerotic heart disease of native coronary artery without angina pectoris: Secondary | ICD-10-CM

## 2013-03-17 DIAGNOSIS — Z09 Encounter for follow-up examination after completed treatment for conditions other than malignant neoplasm: Secondary | ICD-10-CM

## 2013-03-17 DIAGNOSIS — Z951 Presence of aortocoronary bypass graft: Secondary | ICD-10-CM

## 2013-03-17 IMAGING — CR DG CHEST 2V
2 series · 2 of 2 positions shown · non-contrast
Comparison: Chest x-ray of [DATE]

CLINICAL DATA: History of CABG in [DATE], soreness near
sternal wires

CHEST - 2 VIEW

[w chest pa]
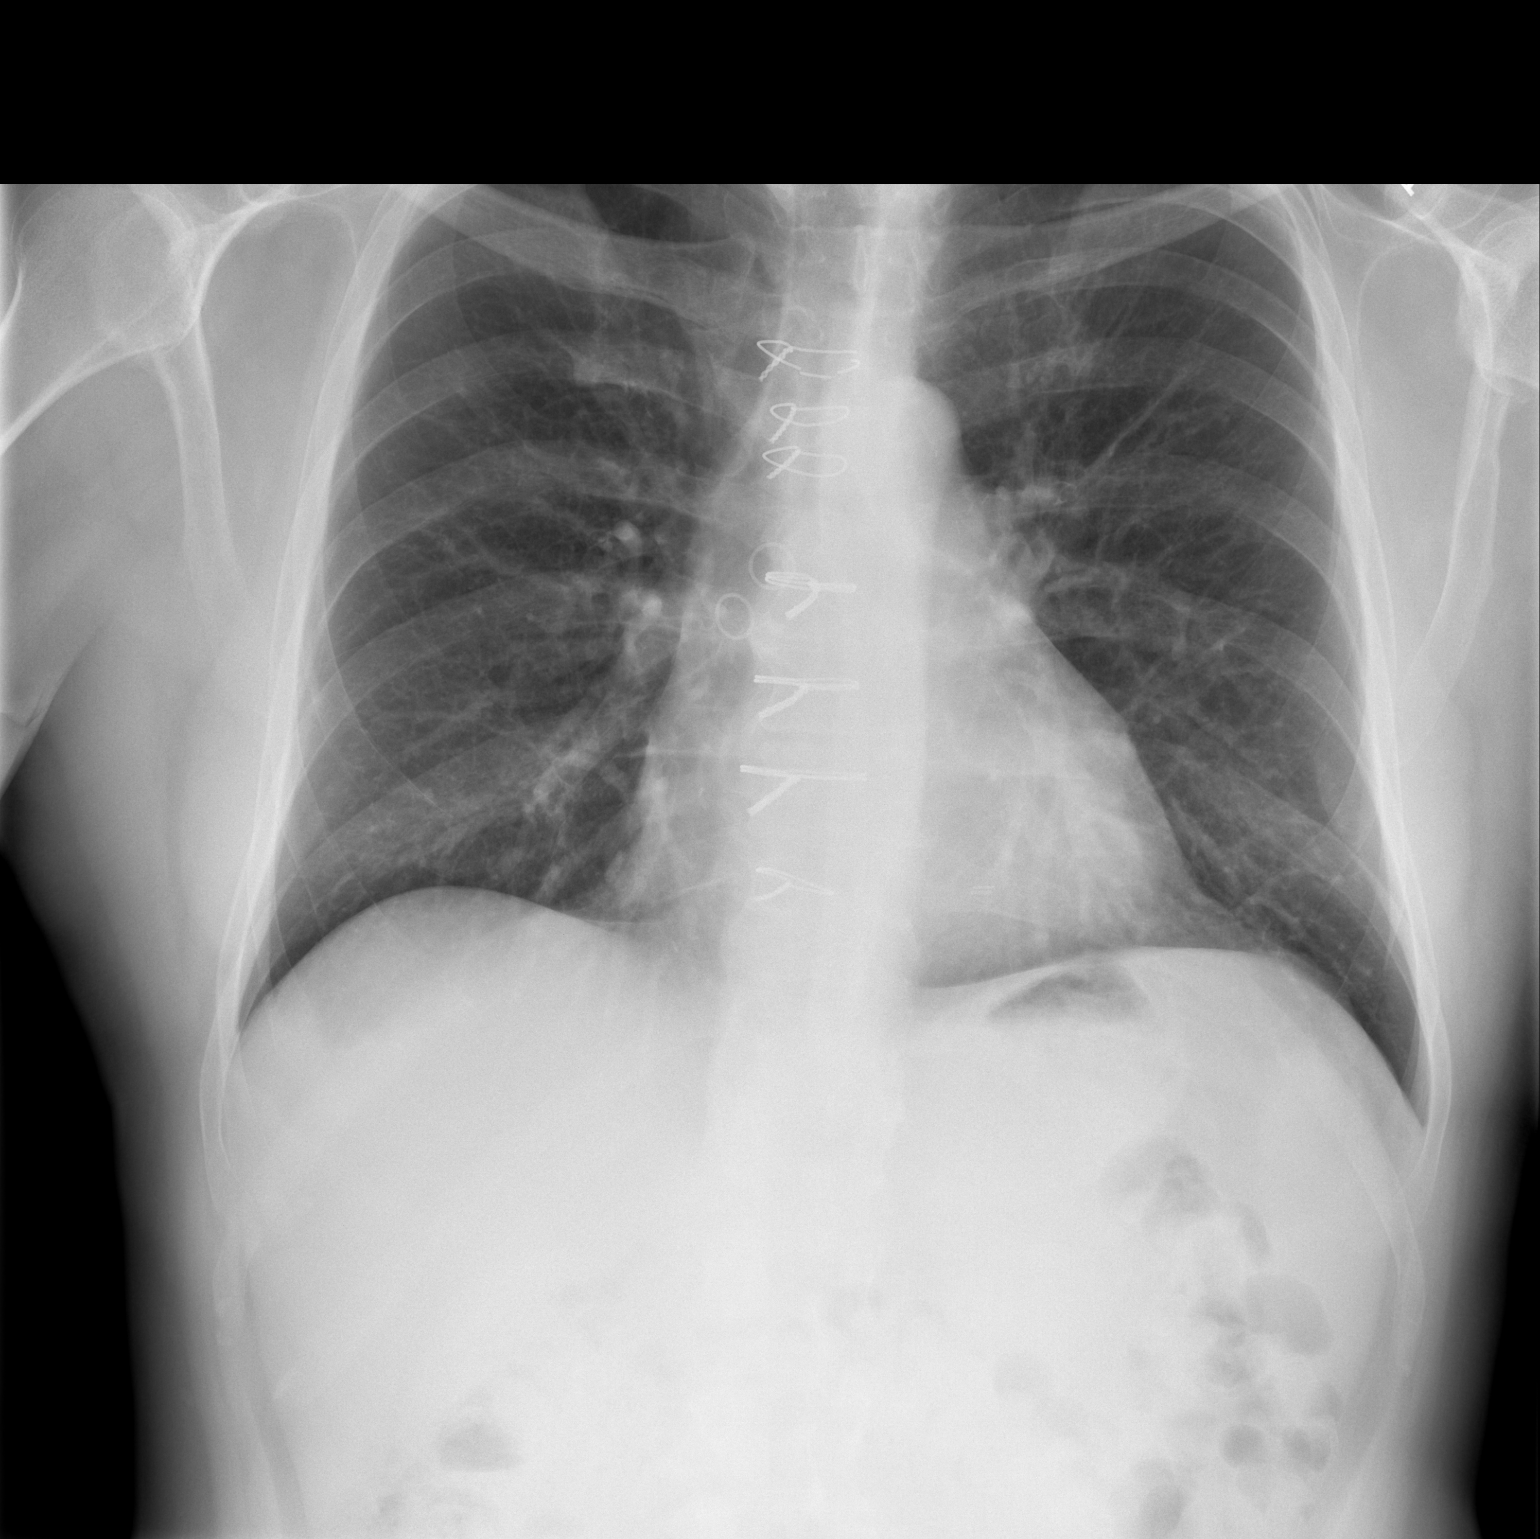

[w chest lat]
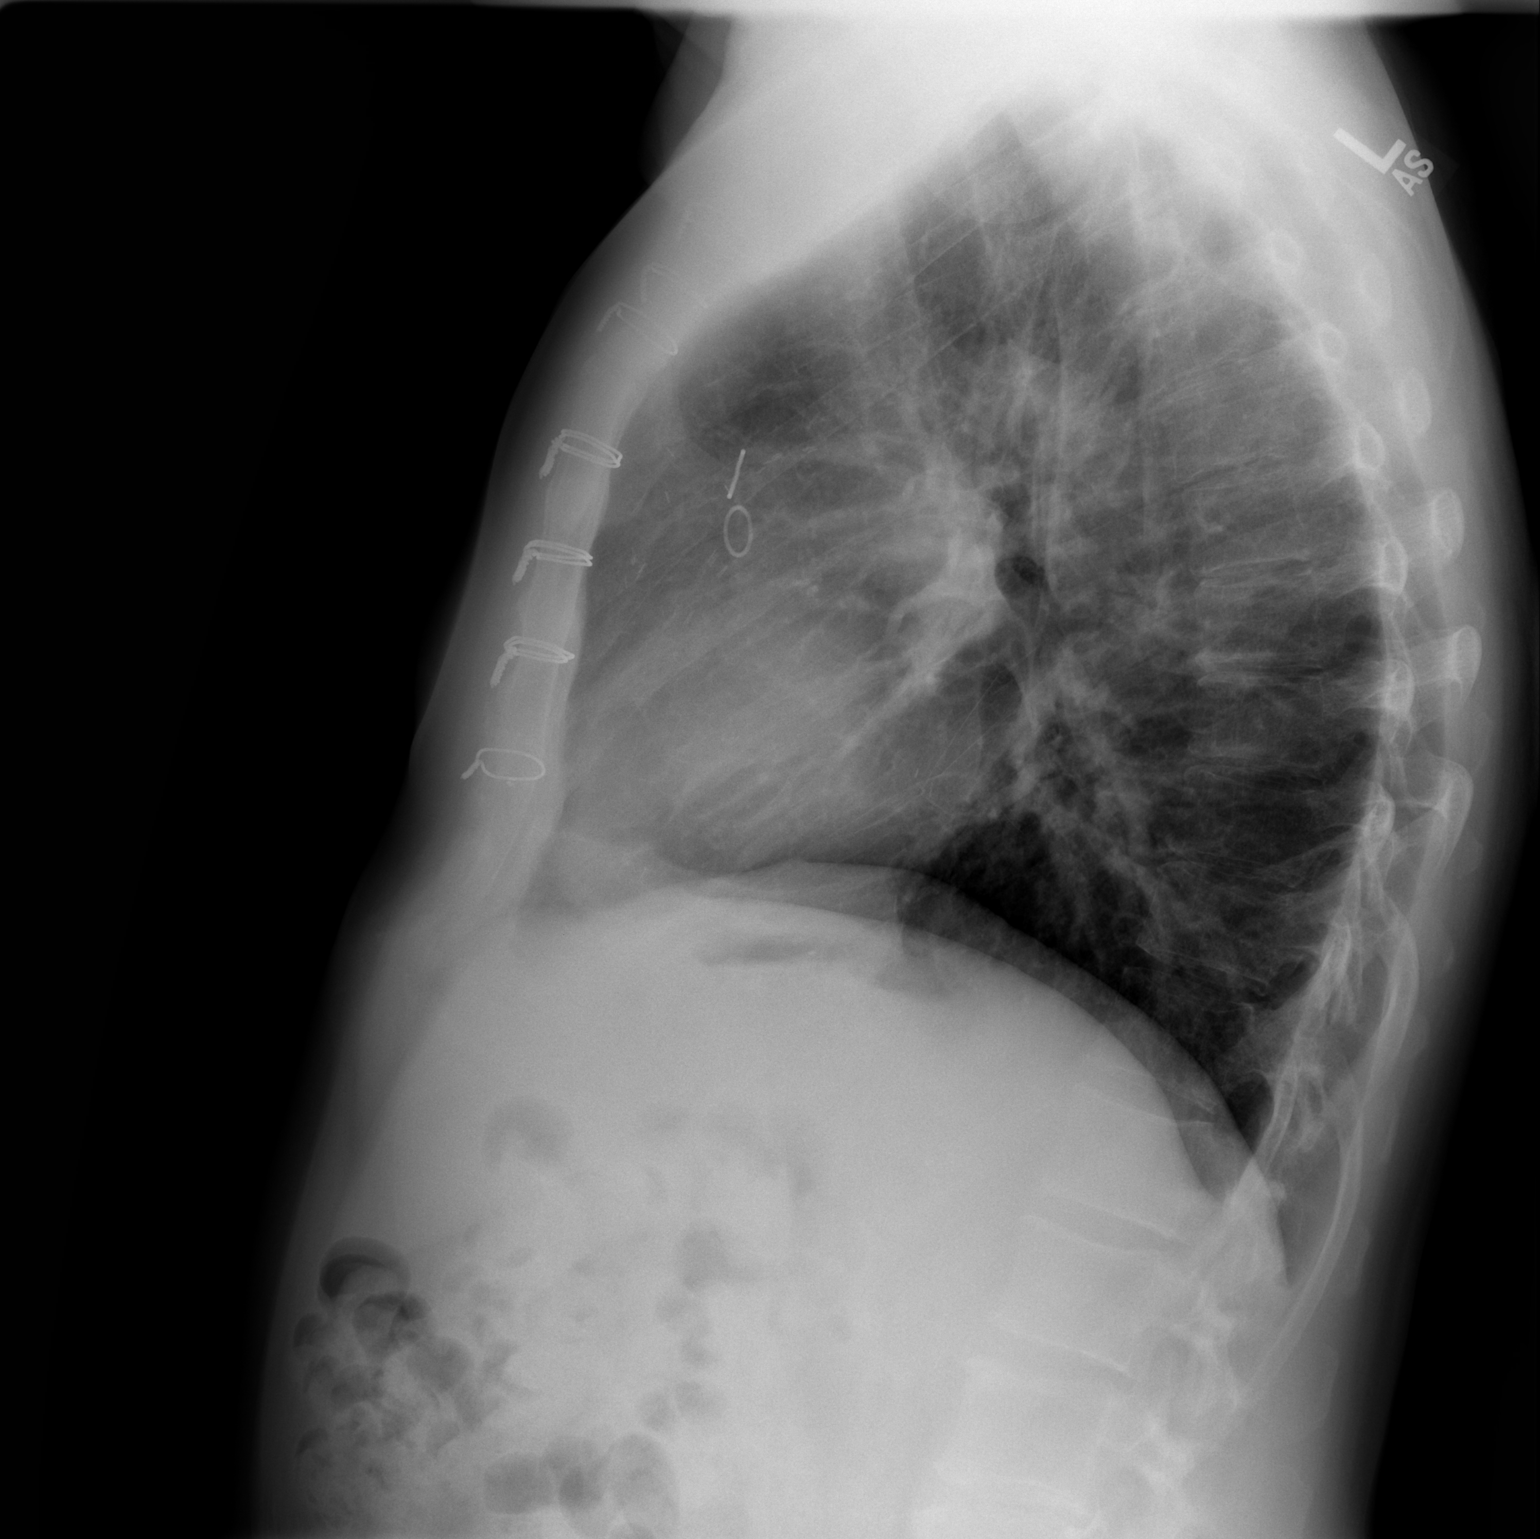

[2 of 2 positions shown; findings below may reference images not displayed]

FINDINGS: The lungs are clear.  Mediastinal contours appear normal.
Median sternotomy sutures are noted from prior CABG and no
abnormality of the suture wires is evident.  No retrosternal soft
tissue swelling is noted.  The heart is within upper limits normal
in size and the thoracic vertebrae are in normal alignment.
IMPRESSION: Stable chest x-ray.  No active lung disease.

## 2013-03-17 NOTE — Progress Notes (Signed)
PCP is Alda Lea Referring Provider is Daleen Squibb, Jesse Sans, MD  Chief Complaint  Patient presents with  . Routine Post Op    check sternal incision....had cxr today    HPI: Status post CABG by Dr. Tyrone Sage August 2013 Doing well back to full extremities, back to work Developed carbuncle close to sternal incision and seen by primary M.D. Started on Keflex 500 twice a day and referred to this office. No fever no drainage.  Following CABG the sternal incision healed nicely without any problem. No diabetic history   Past Medical History  Diagnosis Date  . Hyperlipidemia     Not thought to warrant pharmacologic therapy  . Insomnia   . Arteriosclerotic cardiovascular disease (ASCVD)     Onset in 04/2012; CABG in 06/2012  . Nephrolithiasis     Past Surgical History  Procedure Laterality Date  . Rhinoplasty    . Tonsillectomy  ~1971  . Coronary artery bypass graft  06/24/2012    Procedure: CORONARY ARTERY BYPASS GRAFTING (CABG);  Surgeon: Delight Ovens, MD;  [facility] OR;  Service: Open Heart Surgery;  Laterality: N/A;  Coronary Artery  Bypass X 4 utilizing endoscopic saphenous vein graft and  Left radial Artery Harvest     Family History  Problem Relation Age of Onset  . Ulcers Father     Peptic ulcer disease  . Hypertension Mother   . Coronary artery disease Father     Stents  . Coronary artery disease Brother     CABG    Social History History  Substance Use Topics  . Smoking status: Never Smoker   . Smokeless tobacco: Former Neurosurgeon    Types: Chew    Quit date: 11/04/1998     Comment: 06/22/2012 "quit chewing 10-15 years ago"  . Alcohol Use: No    Current Outpatient Prescriptions  Medication Sig Dispense Refill  . aspirin 325 MG EC tablet Take 325 mg by mouth daily.      [name] aspirin 81 MG chewable tablet Chew 81 mg by mouth daily. TO START DOSAGE IN 6 MONTHS APPROXIMATELY July 2014      . cephALEXin (KEFLEX) 500 MG capsule Take 1 capsule (500 mg total)  by mouth 2 (two) times daily.  20 capsule  0  . Omega-3 Fatty Acids (FISH OIL) 500 MG CAPS Take 1 capsule by mouth daily.       . simvastatin (ZOCOR) 40 MG tablet Take 40 mg by mouth every evening.       No current facility-administered medications for this visit.    No Known Allergies  Review of Systems no fever positive tenderness around the swollen erythematous site  BP 123/75  Pulse 86  Resp 16  Ht 5\' 10"  (1.778 m)  Wt 185 lb (83.915 kg)  BMI 26.54 kg/m2  SpO2 98% Physical Exam Infected sebaceous cyst just to the left of the sternal incision with cellulitis and minimal drainage  Under local 1% lidocaine I and D. performed removing some sebaceous material and some cloudy fluid. Area clean with peroxide saline and packed with a half-inch Nu Gauze wet-to-dry. Packing will be removed by family tomorrow and local wound care with saline and Neosporin dressings will be started. He will finish the Keflex started by primary M.D.  Diagnostic Tests: None  Impression: Infected sebaceous cyst of chest wall close to previous sternotomy incision  Plan: Return for wound check Monday afternoon in 5 days Call for increased redness drainage or pain or fever

## 2013-03-22 ENCOUNTER — Ambulatory Visit: Payer: Self-pay

## 2013-03-30 NOTE — Progress Notes (Signed)
Cardiac Rehabilitation Program Outcomes Report   Orientation:  07/23/2012 Graduate Date:  N/A Discharge Date:  08/03/2012 # of sessions completed: 4  Cardiologist: [place] Bing Family MD:  No PCP at present Class Time:  09:30  A.  Exercise Program:  Tolerates exercise @ 2.30 METS for 15 minutes, Walk Test Results:  Pre:  Pre Walk Test : Pre HR 85, BP 98/62, O2 98%, RPE 9, and RPD 9, 6 min HR 108, BP 118/60, O2 99, RPE 10, and RPD 10, Post HR 69, BP 110/68, O2 100%, Rpe 8 and PPD 8.  Walked 1400 ft at 2.6 mph At 3.0 METS  and Discharged  B.  Mental Health:  Good mental attitude and Quality of Life (QOL)  improvements:  Overall  27.56 %, Health/Functioning 26.39 %, Socioeconomics 25.83 %, Psych/Spiritual 30.00 %, Family 30.00 %    C.  Education/Instruction/Skills  Accurately checks own pulse.  Rest:  99  Exercise:  145, Knows THR for exercise, Uses Perceived Exertion Scale and/or Dyspnea Scale and Attended 1 education classes  Uses Perceived Exertion Scale and/or Dyspnea Scale  D.  Nutrition/Weight Control/Body Composition:  Adherence to prescribed nutrition program: good    E.  Blood Lipids    Lab Results  Component Value Date   CHOL 110 09/09/2012   HDL 26* 09/09/2012   LDLCALC 45 09/09/2012   TRIG 195* 09/09/2012   CHOLHDL 4.2 09/09/2012    F.  Lifestyle Changes:  Making positive lifestyle changes and Not smoking:  Quit 11/04/1998  G.  Symptoms noted with exercise:  Asymptomatic  Report Completed By:  Lelon Huh. Levent Kornegay RN   Comments:  This is patients discharge report . He only attended 4 sessions and then did not return to rehab. He achieved a Peak METS of 2.30. His resting HR was 113 and his resting BP was 130/62. His peak HR was 145 and his Peak BP was 142/60. He never returned to rehab.

## 2013-06-04 DIAGNOSIS — Z0271 Encounter for disability determination: Secondary | ICD-10-CM

## 2013-08-27 ENCOUNTER — Encounter: Payer: Self-pay | Admitting: Adult Health

## 2014-03-30 ENCOUNTER — Encounter: Payer: Self-pay | Admitting: Adult Health

## 2014-06-21 ENCOUNTER — Encounter (HOSPITAL_COMMUNITY): Payer: Self-pay | Admitting: Emergency Medicine

## 2014-06-21 ENCOUNTER — Emergency Department (HOSPITAL_COMMUNITY)
Admission: EM | Admit: 2014-06-21 | Discharge: 2014-06-21 | Disposition: A | Discharge status: Home / Self Care | Payer: Self-pay | Attending: Emergency Medicine | Admitting: Emergency Medicine

## 2014-06-21 DIAGNOSIS — K209 Esophagitis, unspecified without bleeding: Secondary | ICD-10-CM | POA: Insufficient documentation

## 2014-06-21 DIAGNOSIS — R42 Dizziness and giddiness: Secondary | ICD-10-CM | POA: Insufficient documentation

## 2014-06-21 DIAGNOSIS — E785 Hyperlipidemia, unspecified: Secondary | ICD-10-CM | POA: Insufficient documentation

## 2014-06-21 DIAGNOSIS — I2510 Atherosclerotic heart disease of native coronary artery without angina pectoris: Secondary | ICD-10-CM | POA: Insufficient documentation

## 2014-06-21 DIAGNOSIS — Z951 Presence of aortocoronary bypass graft: Secondary | ICD-10-CM | POA: Insufficient documentation

## 2014-06-21 DIAGNOSIS — Z7982 Long term (current) use of aspirin: Secondary | ICD-10-CM | POA: Insufficient documentation

## 2014-06-21 DIAGNOSIS — Z79899 Other long term (current) drug therapy: Secondary | ICD-10-CM | POA: Insufficient documentation

## 2014-06-21 DIAGNOSIS — K219 Gastro-esophageal reflux disease without esophagitis: Secondary | ICD-10-CM | POA: Insufficient documentation

## 2014-06-21 LAB — BASIC METABOLIC PANEL WITH GFR
Anion gap: 13 (ref 5–15)
BUN: 13 mg/dL (ref 6–23)
CO2: 23 meq/L (ref 19–32)
Calcium: 10.1 mg/dL (ref 8.4–10.5)
Chloride: 107 meq/L (ref 96–112)
Creatinine, Ser: 0.92 mg/dL (ref 0.50–1.35)
GFR calc Af Amer: >90 mL/min
GFR calc non Af Amer: >90 mL/min
Glucose, Bld: 110 mg/dL — ABNORMAL HIGH (ref 70–99)
Potassium: 4.1 meq/L (ref 3.7–5.3)
Sodium: 143 meq/L (ref 137–147)

## 2014-06-21 LAB — CBC
HEMATOCRIT: 44.6 % (ref 39.0–52.0)
HEMOGLOBIN: 15.3 g/dL (ref 13.0–17.0)
MCH: 29.9 pg (ref 26.0–34.0)
MCHC: 34.3 g/dL (ref 30.0–36.0)
MCV: 87.3 fL (ref 78.0–100.0)
Platelets: 178 10*3/uL (ref 150–400)
RBC: 5.11 MIL/uL (ref 4.22–5.81)
RDW: 12 % (ref 11.5–15.5)
WBC: 7.4 10*3/uL (ref 4.0–10.5)

## 2014-06-21 LAB — URINALYSIS, ROUTINE W REFLEX MICROSCOPIC
Bilirubin Urine: NEGATIVE
Glucose, UA: NEGATIVE mg/dL
Hgb urine dipstick: NEGATIVE
Ketones, ur: NEGATIVE mg/dL
Leukocytes, UA: NEGATIVE
Nitrite: NEGATIVE
Protein, ur: NEGATIVE mg/dL
Specific Gravity, Urine: 1.021 (ref 1.005–1.030)
Urobilinogen, UA: 1 mg/dL (ref 0.0–1.0)
pH: 6.5 (ref 5.0–8.0)

## 2014-06-21 LAB — TROPONIN I: Troponin I: <0.3 ng/mL

## 2014-06-21 MED ORDER — PANTOPRAZOLE SODIUM 40 MG PO TBEC: 40.0000 mg | DELAYED_RELEASE_TABLET | Freq: Every day | ORAL | Status: DC

## 2014-06-21 NOTE — ED Provider Notes (Signed)
Patient seen and evaluated.Discussed with  PA.  Describes a feeling of heartburn this morning. Some salsa last night. Was belching today. Isn't work (. He states he is on his knees with his chest on the ground. Anything CABG attempting to level a cabinet , and he started getting more of the belching feeling. When he sat up he felt dizzy. Symptoms essentially resolved here.  His workup appears normal. He is appropriate for outpatient treatment. We'll place him on a proton pump inhibitor. However, I have asked him to recheck if his symptoms recur.  Tanna Furry, MD 06/21/14 [phone]

## 2014-06-21 NOTE — ED Provider Notes (Signed)
CSN: [id number]     Arrival date & time 06/21/14  1233 History   First MD Initiated Contact with Patient 06/21/14 1243     Chief Complaint  Patient presents with  . Dizziness     (Consider location/radiation/quality/duration/timing/severity/associated sxs/prior Treatment) HPI Edward Fisher is a 48 year old male with past medical history of CAD with CABG in 06/2012, hyperlipidemia who presents to the ER with 2 hours of "lightheadedness". Patient states he was at work, where he was lifting some cabinets which weigh about 30-40 pounds. He states this was not any more strenuous than his regular work, however he noticed he acutely began to feel dizzy at that time and his dizziness has persisted patient states after becoming "lightheaded" he called 911. EMS transported patient noted patient had a initial blood pressure of 160/100. Patient also states his morning around 5 AM he experienced some "indigestion" and was "belching a lot", and had 3 loose bowel movements, however he states he "ate salsa last night" . Patient states the symptoms subsided quickly. Currently in the ER patient states that his "lightheadedness" is persistent, and has no aggravating or alleviating factors. Patient denies chest pain, shortness of breath, nausea, vomiting, palpitations, fever, chills, weakness.   Past Medical History  Diagnosis Date  . Hyperlipidemia     Not thought to warrant pharmacologic therapy  . Insomnia   . Arteriosclerotic cardiovascular disease (ASCVD)     Onset in 04/2012; CABG in 06/2012  . Nephrolithiasis    Past Surgical History  Procedure Laterality Date  . Rhinoplasty    . Tonsillectomy  ~1971  . Coronary artery bypass graft  06/24/2012    Procedure: CORONARY ARTERY BYPASS GRAFTING (CABG);  Surgeon: [name], MD;  Location: [facility];  Service: Open Heart Surgery;  Laterality: N/A;  Coronary Artery  Bypass X 4 utilizing endoscopic saphenous vein graft and  Left radial Artery Harvest    Family  History  Problem Relation Age of Onset  . Ulcers Father     Peptic ulcer disease  . Hypertension Mother   . Coronary artery disease Father     Stents  . Coronary artery disease Brother     CABG   History  Substance Use Topics  . Smoking status: Never Smoker   . Smokeless tobacco: Former Systems developer    Types: [place] date: 11/04/1998     Comment: 06/22/2012 "quit chewing 10-15 years ago"  . Alcohol Use: No    Review of Systems  Constitutional: Negative for fever and chills.  HENT: Negative for hearing loss, tinnitus and voice change.   Eyes: Negative for visual disturbance.  Respiratory: Negative for cough, chest tightness and shortness of breath.   Cardiovascular: Negative for chest pain, palpitations and leg swelling.  Gastrointestinal: Negative for nausea, vomiting, abdominal pain and diarrhea.  Endocrine: Positive for polyuria.  Genitourinary: Negative for dysuria.  Musculoskeletal: Negative for arthralgias.  Neurological: Positive for light-headedness. Negative for dizziness, syncope, facial asymmetry, speech difficulty, weakness, numbness and headaches.  Psychiatric/Behavioral: Negative.       Allergies  Review of patient's allergies indicates no known allergies.  Home Medications   Prior to Admission medications   Medication Sig Start Date End Date Taking? Authorizing Provider  aspirin 81 MG chewable tablet Chew 81 mg by mouth every evening. TO START DOSAGE IN 6 MONTHS APPROXIMATELY July 2014   Yes Historical Provider, MD  Docusate Calcium (STOOL SOFTENER PO) Take 1 capsule by mouth every evening.   Yes Historical  Provider, MD  omega-3 acid ethyl esters (LOVAZA) 1 G capsule Take 1 g by mouth every evening.   Yes Historical Provider, MD  simvastatin (ZOCOR) 40 MG tablet Take 40 mg by mouth every evening.   Yes Historical Provider, MD  pantoprazole (PROTONIX) 40 MG tablet Take 1 tablet (40 mg total) by mouth daily. 06/21/14   Carrie Mew, PA-C   BP 126/75  Pulse  87  Temp(Src) 98.2 F (36.8 C) (Oral)  Resp 18  Ht 5\' 11"  (1.803 m)  Wt 190 lb (86.183 kg)  BMI 26.51 kg/m2  SpO2 96% Physical Exam  Constitutional: He is oriented to person, place, and time. He appears well-developed and well-nourished. No distress.  HENT:  Head: Normocephalic and atraumatic.  Eyes: Pupils are equal, round, and reactive to light. No scleral icterus.  Neck: Normal range of motion.  Cardiovascular: Normal rate, regular rhythm and normal heart sounds.   No murmur heard. Pulmonary/Chest: Effort normal and breath sounds normal. No accessory muscle usage. Not tachypneic. No respiratory distress.  Abdominal: Normal appearance and bowel sounds are normal. There is no tenderness.  Neurological: He is alert and oriented to person, place, and time. He has normal strength. No cranial nerve deficit or sensory deficit. He displays a negative Romberg sign. Coordination and gait normal.  Skin: He is not diaphoretic.  Psychiatric: He has a normal mood and affect.    ED Course  Procedures (including critical care time) Labs Review Labs Reviewed  BASIC METABOLIC PANEL - Abnormal; Notable for the following:    Glucose, Bld 110 (*)    All other components within normal limits  CBC  TROPONIN I  URINALYSIS, ROUTINE W REFLEX MICROSCOPIC    Imaging Review No results found.   EKG Interpretation None      MDM   Final diagnoses:  Gastroesophageal reflux disease, esophagitis presence not specified    48 year old male with history of CABG who presents today with 2 hours of "lightheadedness". Patient denies dizziness, room spinning, nausea, vomiting, chest pain, shortness of breath, palpitations, weakness. Cardiovascular exam benign, neuro exam benign. Initial workup to include EKG twelve-lead, troponin, CBC, BMP, UA.  All labs returned within normal limits. EKG shows sinus rhythm with no acute injury or ectopy. Troponin negative.  Patient labs return normal, patient  asymptomatic at this time. Patient able to walk to the bathroom comfortably with no assistance and no change or return of symptoms. Due to the fact the patient did not develop any symptoms related to angina and his lightheadedness improved while he sitting in the ER, we strongly encouraged him to follow up with his cardiologist as an outpatient. Patient was agreeable to this plan. We gave patient a prescription for Protonix for potential GERD. We also encourage patient to call or return to the ER should his symptoms continue, worsen, return or should he have any questions or concerns.   Signed,  Dahlia Bailiff, PA-C 6:15 PM  Patient seen and discussed with Dr. Tanna Furry, M.D.    Carrie Mew, PA-C 06/21/14 [phone]

## 2014-06-21 NOTE — Discharge Instructions (Signed)
Take Protonix once a day for reflux.  Return to the ER should your symptoms return or worsen.  Follow up with Cardiologist and Primary Care Physician.       Gastroesophageal Reflux Disease, Adult Gastroesophageal reflux disease (GERD) happens when acid from your stomach flows up into the esophagus. When acid comes in contact with the esophagus, the acid causes soreness (inflammation) in the esophagus. Over time, GERD may create small holes (ulcers) in the lining of the esophagus. CAUSES   Increased body weight. This puts pressure on the stomach, making acid rise from the stomach into the esophagus.  Smoking. This increases acid production in the stomach.  Drinking alcohol. This causes decreased pressure in the lower esophageal sphincter (valve or ring of muscle between the esophagus and stomach), allowing acid from the stomach into the esophagus.  Late evening meals and a full stomach. This increases pressure and acid production in the stomach.  A malformed lower esophageal sphincter. Sometimes, no cause is found. SYMPTOMS   Burning pain in the lower part of the mid-chest behind the breastbone and in the mid-stomach area. This may occur twice a week or more often.  Trouble swallowing.  Sore throat.  Dry cough.  Asthma-like symptoms including chest tightness, shortness of breath, or wheezing. DIAGNOSIS  Your caregiver may be able to diagnose GERD based on your symptoms. In some cases, X-rays and other tests may be done to check for complications or to check the condition of your stomach and esophagus. TREATMENT  Your caregiver may recommend over-the-counter or prescription medicines to help decrease acid production. Ask your caregiver before starting or adding any new medicines.  HOME CARE INSTRUCTIONS   Change the factors that you can control. Ask your caregiver for guidance concerning weight loss, quitting smoking, and alcohol consumption.  Avoid foods and drinks that make your  symptoms worse, such as:  Caffeine or alcoholic drinks.  Chocolate.  Peppermint or mint flavorings.  Garlic and onions.  Spicy foods.  Citrus fruits, such as oranges, lemons, or limes.  Tomato-based foods such as sauce, chili, salsa, and pizza.  Fried and fatty foods.  Avoid lying down for the 3 hours prior to your bedtime or prior to taking a nap.  Eat small, frequent meals instead of large meals.  Wear loose-fitting clothing. Do not wear anything tight around your waist that causes pressure on your stomach.  Raise the head of your bed 6 to 8 inches with wood blocks to help you sleep. Extra pillows will not help.  Only take over-the-counter or prescription medicines for pain, discomfort, or fever as directed by your caregiver.  Do not take aspirin, ibuprofen, or other nonsteroidal anti-inflammatory drugs (NSAIDs). SEEK IMMEDIATE MEDICAL CARE IF:   You have pain in your arms, neck, jaw, teeth, or back.  Your pain increases or changes in intensity or duration.  You develop nausea, vomiting, or sweating (diaphoresis).  You develop shortness of breath, or you faint.  Your vomit is green, yellow, black, or looks like coffee grounds or blood.  Your stool is red, bloody, or black. These symptoms could be signs of other problems, such as heart disease, gastric bleeding, or esophageal bleeding. MAKE SURE YOU:   Understand these instructions.  Will watch your condition.  Will get help right away if you are not doing well or get worse. Document Released: 07/31/2005 Document Revised: 01/13/2012 Document Reviewed: 05/10/2011 [facility] Patient Information 2015 [place], [place]. This information is not intended to replace advice given to you by  your health care provider. Make sure you discuss any questions you have with your health care provider. ° °

## 2014-06-22 ENCOUNTER — Encounter (HOSPITAL_COMMUNITY)
Admission: RE | Admit: 2014-06-22 | Discharge: 2014-06-22 | Disposition: A | Discharge status: Home / Self Care | Payer: Self-pay | Source: Ambulatory Visit | Attending: Physician Assistant | Admitting: Physician Assistant

## 2014-06-22 ENCOUNTER — Telehealth: Payer: Self-pay | Admitting: Adult Health

## 2014-06-22 ENCOUNTER — Encounter: Payer: Self-pay | Admitting: Physician Assistant

## 2014-06-22 ENCOUNTER — Encounter (HOSPITAL_COMMUNITY): Payer: Self-pay

## 2014-06-22 ENCOUNTER — Ambulatory Visit (INDEPENDENT_AMBULATORY_CARE_PROVIDER_SITE_OTHER): Payer: Self-pay | Admitting: Physician Assistant

## 2014-06-22 ENCOUNTER — Encounter: Payer: Self-pay | Admitting: *Deleted

## 2014-06-22 ENCOUNTER — Ambulatory Visit (HOSPITAL_COMMUNITY)
Admission: RE | Admit: 2014-06-22 | Discharge: 2014-06-22 | Disposition: A | Discharge status: Home / Self Care | Payer: Self-pay | Source: Ambulatory Visit | Attending: Physician Assistant | Admitting: Physician Assistant

## 2014-06-22 VITALS — BP 132/72 | HR 83 | Ht 71.0 in | Wt 187.0 lb

## 2014-06-22 DIAGNOSIS — I2510 Atherosclerotic heart disease of native coronary artery without angina pectoris: Secondary | ICD-10-CM | POA: Insufficient documentation

## 2014-06-22 DIAGNOSIS — I10 Essential (primary) hypertension: Secondary | ICD-10-CM | POA: Insufficient documentation

## 2014-06-22 DIAGNOSIS — R079 Chest pain, unspecified: Secondary | ICD-10-CM

## 2014-06-22 DIAGNOSIS — R142 Eructation: Secondary | ICD-10-CM | POA: Insufficient documentation

## 2014-06-22 IMAGING — NM NM MYOCAR MULTI W/SPECT W/WALL MOTION & EF
2 series · 12 of 12 positions shown · non-contrast
Comparison: None.

CLINICAL DATA: 48-year-old male with a past history of coronary
artery disease referred for chest pain.

EXAM:
MYOCARDIAL IMAGING WITH SPECT (REST AND EXERCISE)
GATED LEFT VENTRICULAR WALL MOTION STUDY
LEFT VENTRICULAR EJECTION FRACTION
TECHNIQUE: Standard myocardial SPECT imaging was performed after resting
intravenous injection of 10 mCi [SM] sestamibi. Subsequently,
exercise tolerance test was performed by the patient under the
supervision of the Cardiology staff. At peak-stress, 30 mCi [SM]
sestamibi was injected intravenously and standard myocardial SPECT
imaging was performed. Quantitative gated imaging was also performed
to evaluate left ventricular wall motion, and estimate left
ventricular ejection fraction.

[Series 1: rest · 8.28mm/px · 6 of 64 frames shown]
[frame 6/64]
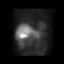
[frame 16/64]
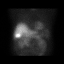
[frame 27/64]
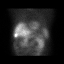
[frame 38/64]
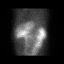
[frame 48/64]
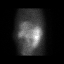
[frame 59/64]
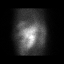

[Series 2: stress gated · 8.28mm/px · 6 of 64 frames shown]
[frame 6/64]
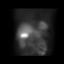
[frame 16/64]
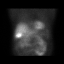
[frame 27/64]
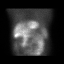
[frame 38/64]
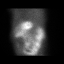
[frame 48/64]
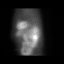
[frame 59/64]
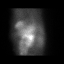

[12 of 12 positions shown; findings below may reference images not displayed]

FINDINGS: Exercise stress

Baseline EKG showed normal sinus rhythm. The patient was stressed
according to the Bruce protocol for 7 min and 58 seconds achieving a
work level of 10.1 Mets. The resting heart rate of 71 beats per min
rose to a maximal rate of 160 beats per min, representing 94% of the
maximal age predicted heart rate. The resting blood pressure
increased from 137/83 up to 195/72. The test was stopped due to
fatigue, the patient did not experience any chest pain.

Stress EKG showed no specific ischemic changes and no significant
arrhythmias.

Myocardial perfusion imaging

Raw images showed appropriate radiotracer uptake. There was a
moderate-sized mild intensity inferior wall defect seen in the
resting images, there is no evidence of this defect in the post
exercise images. The inferior wall has normal wall motion. This
finding is consistent with sub- diaphragmatic attenuation. There
were no other myocardial perfusion defects.

Gated imaging shows end-diastolic volume 72 mL, end systolic volume
18 mL, left ventricular ejection fraction 75%. There was normal wall
motion.
IMPRESSION: 1.  Negative stress MPI for ischemia

2.  Negative stress EKG for ischemia

3.  Normal LV systolic function, LVEF 75%

4.  Low risk Duke treadmill score of 10

5. Very good exercise capacity (120% of predicted based on age and
gender)

## 2014-06-22 MED ORDER — TECHNETIUM TC 99M SESTAMIBI GENERIC - CARDIOLITE
30.0000 | Freq: Once | INTRAVENOUS | Status: AC | PRN
  Administered 2014-06-22: 30 via INTRAVENOUS

## 2014-06-22 MED ORDER — NITROGLYCERIN 0.4 MG SL SUBL: 0.4000 mg | SUBLINGUAL_TABLET | SUBLINGUAL | Status: DC | PRN

## 2014-06-22 MED ORDER — TECHNETIUM TC 99M SESTAMIBI - CARDIOLITE
10.0000 | Freq: Once | INTRAVENOUS | Status: AC | PRN
  Administered 2014-06-22: 10 via INTRAVENOUS

## 2014-06-22 NOTE — Telephone Encounter (Signed)
ED note reviewed with Dr. Aundra Dubin who recommends pt be seen in Monday in [place] office as planned on Monday. I spoke with pt's wife and gave her this information. She states pt is usually seen in [place] office but has not followed up. He works in [place].  She reports she is very concerned about pt and he is going out of town next week.  She is requesting again he be seen today. Will discuss with DOD.

## 2014-06-22 NOTE — Telephone Encounter (Signed)
New problem   Pt was seen in ED yesterday and wife is calling stating per ED discharge instructions pt need to be seen today in office. Pt's wife called [place] office and they stated pt could be seen on Monday and pt's wife didn't take the appt. Please advise

## 2014-06-22 NOTE — Assessment & Plan Note (Signed)
Patient had episode of chest pain associated with excessive belching and loose stools. Recommend filling his Protonix prescription given to him in the emergency room.

## 2014-06-22 NOTE — Assessment & Plan Note (Signed)
History of CABG in 2013. Will give him a prescription for sublingual nitroglycerin. Order a stress Myoview and followup with Dr. Harl Bowie or Bronson Ing.

## 2014-06-22 NOTE — Assessment & Plan Note (Addendum)
Patient had an episode of chest pain that awakened him at 5 AM yesterday associated with belching and loose stools. He went to the emergency room where troponins were negative and EKG was unchanged. With history of CABG and lack of symptoms prior to CABG we will proceed with stress Myoview. We are able to fit him in for a stress test today at 2:30.

## 2014-06-22 NOTE — Telephone Encounter (Signed)
Spoke to pt wife and she is bringing him in now to see Estella Husk

## 2014-06-22 NOTE — Progress Notes (Signed)
Stress Lab Nurses Notes - [facility]  TARA WICH 06/22/2014 Reason for doing test: CAD and Chest Pain Type of test: Stress Cardiolite Nurse performing test: Gerrit Halls, RN Nuclear Medicine Tech: Melburn Hake Echo Tech: Not Applicable MD performing test: Branch/M.Lenze PA Family MD: Soyla Dryer PA-C  Test explained and consent signed: Yes.   IV started: Saline lock flushed, No redness or edema and Saline lock started in radiology Symptoms: fatigue Treatment/Intervention: None Reason test stopped: fatigue and reached target HR After recovery IV was: Discontinued via X-ray tech and No redness or edema Patient to return to Nuc. Med at : 15:30 Patient discharged: Home Patient's Condition upon discharge was: stable Comments: During test peak BP 195/72 & HR 160.  Recovery BP 138/83 & HR 93.  Symptoms resolved in recovery. Geanie Cooley T

## 2014-06-22 NOTE — Telephone Encounter (Signed)
Reviewed with Dr.McLean who recommends pt be seen by DOD in [place] office today. I spoke with Erline Levine in [place] office and she will contact wife about appt today.

## 2014-06-22 NOTE — Assessment & Plan Note (Signed)
Patient's lipids are checked at [facility] yearly and have been stable.

## 2014-06-22 NOTE — Assessment & Plan Note (Signed)
Patient's blood pressure was elevated yesterday during his episode of dizziness which made him quite anxious. He has never had hypertension before. He is checked every 3 months at the [facility] clinic. I will not start treatment at this time.

## 2014-06-22 NOTE — Progress Notes (Signed)
HPI: This is a 48 year old prior patient of Dr. Lattie Haw who has history of CABG x4 in 2013. He also has hyperlipidemia.   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 He had no associated chest pain or shortness of breath with the dizziness. He also complained of awakening at 5 AM with intense chest pain associated with had excess of gas and belching and 3 loose bowel movements that resolved spontaneously. He states he ate salsa the night before. Troponins were negative, other labs stable, EKG unchanged. Patient felt poorly all day yesterday but feels fine today. He denies any exertional chest tightness, pressure, dyspnea, dyspnea on exertion, dizziness or presyncope. His blood pressure was also elevated when EMS arrived at 160/100. He [facility] to the [facility] 3 months and his blood pressure is always normal. His cholesterol is treated and has been normal. Patient was given a prescription for Protonix but he hasn't filled it yet. No Known Allergies   Current Outpatient Prescriptions  Medication Sig Dispense Refill  . aspirin 81 MG chewable tablet Chew 81 mg by mouth every evening. TO START DOSAGE IN 6 MONTHS APPROXIMATELY July 2014      . Docusate Calcium (STOOL SOFTENER PO) Take 1 capsule by mouth every evening.      [name] omega-3 acid ethyl esters (LOVAZA) 1 G capsule Take 1 g by mouth every evening.      . pantoprazole (PROTONIX) 40 MG tablet Take 1 tablet (40 mg total) by mouth daily.  30 tablet  0  . simvastatin (ZOCOR) 40 MG tablet Take 40 mg by mouth every evening.       No current facility-administered medications for this visit.    Past Medical History  Diagnosis Date  . Hyperlipidemia     Not thought to warrant pharmacologic therapy  . Insomnia   . Arteriosclerotic cardiovascular disease (ASCVD)     Onset in 04/2012; CABG in 06/2012  . Nephrolithiasis     Past Surgical History    Procedure Laterality Date  . Rhinoplasty    . Tonsillectomy  ~1971  . Coronary artery bypass graft  06/24/2012    Procedure: CORONARY ARTERY BYPASS GRAFTING (CABG);  Surgeon: [name], MD;  Location: [facility];  Service: Open Heart Surgery;  Laterality: N/A;  Coronary Artery  Bypass X 4 utilizing endoscopic saphenous vein graft and  Left radial Artery Harvest     Family History  Problem Relation Age of Onset  . Ulcers Father     Peptic ulcer disease  . Hypertension Mother   . Coronary artery disease Father     Stents  . Coronary artery disease Brother     CABG    History   Social History  . Marital Status: Married    Spouse Name: N/A    Number of Children: N/A  . Years of Education: N/A   Occupational History  . Engineer, agricultural   Social History Main Topics  . Smoking status: Never Smoker   . Smokeless tobacco: Former Systems developer    Types: [place] date: 11/04/1998     Comment: 06/22/2012 "quit chewing 10-15 years ago"  . Alcohol Use: No  . Drug Use: No  . Sexual Activity: Yes   Other Topics Concern  . Not on file   Social History Narrative   Regular exercise: No    ROS:  See history of present illness otherwise negative  BP 132/72  Pulse 83  Ht 5\' 11"  (1.803 m)  Wt 187 lb (84.823 kg)  BMI 26.09 kg/m2  SpO2 99%  PHYSICAL EXAM: Well-nournished, in no acute distress. Neck: No JVD, HJR, Bruit, or thyroid enlargement  Lungs: No tachypnea, clear without wheezing, rales, or rhonchi  Cardiovascular: RRR, PMI not displaced, positive S4, no murmur, bruit, thrill, or heave.  Abdomen: BS normal. Soft without organomegaly, masses, lesions or tenderness.  Extremities: without cyanosis, clubbing or edema. Good distal pulses bilateral  SKin: Warm, no lesions or rashes   Musculoskeletal: No deformities  Neuro: no focal signs   Wt Readings from Last 3 Encounters:  06/21/14 190 lb (86.183 kg)  03/17/13 185 lb (83.915 kg)  03/16/13 185 lb 12.8  oz (84.278 kg)     EKG: EKG reviewed from the emergency room yesterday. Normal sinus rhythm with nonspecific ST-T wave changes, no acute change from prior tracing

## 2014-06-22 NOTE — Patient Instructions (Addendum)
Your physician recommends that you schedule a follow-up appointment in:  2 weeks with Dr. Bronson Ing  Your physician has requested that you have en exercise stress myoview. For further information please visit [email or web address]. Please follow instruction sheet, as given.  Your physician has recommended you make the following change in your medication:   TAKE NITROSTAT 0.4MG  AS NEEDED FOR CHEST PAIN  PLACE ONE UNDER TONGUE WAIT 5 MINS REPEAT IF PAIN PERSIST  REPEAT UP TO THREE TIMES, IF PAIN PERSIST PLEASE GO THE ED  Thank you for choosing [facility]!!

## 2014-06-24 NOTE — ED Provider Notes (Signed)
Medical screening examination/treatment/procedure(s) were conducted as a shared visit with non-physician practitioner(s) and myself.  I personally evaluated the patient during the encounter.   EKG Interpretation   Date/Time:  Tuesday June 21 2014 12:44:21 EDT Ventricular Rate:  69 PR Interval:  124 QRS Duration: 88 QT Interval:  380 QTC Calculation: 407 R Axis:   6 Text Interpretation:  Normal sinus rhythm Normal ECG ED PHYSICIAN  INTERPRETATION AVAILABLE IN [facility] Confirmed by TEST, Record  ([id number]) on 06/23/2014 7:02:32 AM      Please see My additional dictation.  Tanna Furry, MD 06/24/14 [phone]

## 2014-06-27 ENCOUNTER — Telehealth: Payer: Self-pay | Admitting: Physician Assistant

## 2014-06-27 NOTE — Telephone Encounter (Signed)
Pt given normal stress test results and he verbalized understanding.

## 2014-06-27 NOTE — Telephone Encounter (Signed)
Follow up    Per pt please give him a  Call back he is returning this offices call for his results.   Per pt a Maudie Mercury called him.

## 2014-07-12 ENCOUNTER — Ambulatory Visit (INDEPENDENT_AMBULATORY_CARE_PROVIDER_SITE_OTHER): Payer: Self-pay | Admitting: Cardiovascular Disease

## 2014-07-12 ENCOUNTER — Encounter: Payer: Self-pay | Admitting: Cardiovascular Disease

## 2014-07-12 VITALS — BP 122/72 | HR 80 | Ht 70.0 in | Wt 186.0 lb

## 2014-07-12 DIAGNOSIS — IMO0001 Reserved for inherently not codable concepts without codable children: Secondary | ICD-10-CM

## 2014-07-12 DIAGNOSIS — Z136 Encounter for screening for cardiovascular disorders: Secondary | ICD-10-CM

## 2014-07-12 DIAGNOSIS — E785 Hyperlipidemia, unspecified: Secondary | ICD-10-CM

## 2014-07-12 DIAGNOSIS — I25810 Atherosclerosis of coronary artery bypass graft(s) without angina pectoris: Secondary | ICD-10-CM

## 2014-07-12 NOTE — Progress Notes (Signed)
Patient ID: Edward Fisher, male   DOB: 05-09-1966, 48 y.o.   MRN: [id number]      SUBJECTIVE: The patient presents for followup after undergoing a nuclear stress test. He has a history of CABG and hyperlipidemia. The results were as follows: IMPRESSION:  1. Negative stress MPI for ischemia  2. Negative stress EKG for ischemia  3. Normal LV systolic function, LVEF 73%  4. Low risk Duke treadmill score of 10  5. Very good exercise capacity (120% of predicted based on age and  gender)  He has had no further episodes of chest pain. He only takes Protonix as needed.  Review of Systems: As per "subjective", otherwise negative.  No Known Allergies  Current Outpatient Prescriptions  Medication Sig Dispense Refill  . aspirin 81 MG chewable tablet Chew 81 mg by mouth every evening. TO START DOSAGE IN 6 MONTHS APPROXIMATELY July 2014      . Docusate Calcium (STOOL SOFTENER PO) Take 1 capsule by mouth every evening.      . nitroGLYCERIN (NITROSTAT) 0.4 MG SL tablet Place 1 tablet (0.4 mg total) under the tongue every 5 (five) minutes as needed for chest pain.  90 tablet  3  . omega-3 acid ethyl esters (LOVAZA) 1 G capsule Take 1 g by mouth every evening.      . pantoprazole (PROTONIX) 40 MG tablet Take 40 mg by mouth as needed.      . simvastatin (ZOCOR) 40 MG tablet Take 40 mg by mouth every evening.       No current facility-administered medications for this visit.    Past Medical History  Diagnosis Date  . Hyperlipidemia     Not thought to warrant pharmacologic therapy  . Insomnia   . Arteriosclerotic cardiovascular disease (ASCVD)     Onset in 04/2012; CABG in 06/2012  . Nephrolithiasis     Past Surgical History  Procedure Laterality Date  . Rhinoplasty    . Tonsillectomy  ~1971  . Coronary artery bypass graft  06/24/2012    Procedure: CORONARY ARTERY BYPASS GRAFTING (CABG);  Surgeon: [name], MD;  Location: [facility];  Service: Open Heart Surgery;  Laterality: N/A;   Coronary Artery  Bypass X 4 utilizing endoscopic saphenous vein graft and  Left radial Artery Harvest     History   Social History  . Marital Status: Married    Spouse Name: N/A    Number of Children: N/A  . Years of Education: N/A   Occupational History  . Engineer, agricultural   Social History Main Topics  . Smoking status: Never Smoker   . Smokeless tobacco: Former Systems developer    Types: [place] date: 11/04/1998     Comment: 06/22/2012 "quit chewing 10-15 years ago"  . Alcohol Use: No  . Drug Use: No  . Sexual Activity: Yes   Other Topics Concern  . Not on file   Social History Narrative   Regular exercise: No     Filed Vitals:   07/12/14 1329  BP: 122/72  Pulse: 80  Height: 5\' 10"  (1.778 m)  Weight: 186 lb (84.369 kg)  SpO2: 99%    PHYSICAL EXAM General: NAD HEENT: Normal. Neck: No JVD, no thyromegaly. Lungs: Clear to auscultation bilaterally with normal respiratory effort. CV: Nondisplaced PMI.  Regular rate and rhythm, normal S1/S2, no S3/S4, no murmur. No pretibial or periankle edema.  No carotid bruit.  Normal pedal pulses.  Abdomen: Soft, nontender, no  hepatosplenomegaly, no distention.  Neurologic: Alert and oriented x 3.  Psych: Normal affect. Skin: Normal. Musculoskeletal: Normal range of motion, no gross deformities. Extremities: No clubbing or cyanosis.   ECG: Most recent ECG reviewed.      ASSESSMENT AND PLAN: 1. CAD: Stable ischemic heart disease. Low risk nuclear MPI study. No further testing is indicated. 2. Hyperlipidemia: Stable, on statin therapy. 3. GERD: I counseled the patient on avoiding foods which would aggravate GERD. I recommendrf that if he has recurrence he should continue with Protonix daily rather than as needed.  Dispo: f/u 1 year.  Kate Sable, M.D., F.A.C.C.

## 2014-07-12 NOTE — Patient Instructions (Signed)
Continue all current medications. Your physician wants you to follow up in:  1 year.  You will receive a reminder letter in the mail one-two months in advance.  If you don't receive a letter, please call our office to schedule the follow up appointment   

## 2014-07-20 ENCOUNTER — Telehealth: Payer: Self-pay | Admitting: *Deleted

## 2014-07-20 NOTE — Telephone Encounter (Signed)
Received lab results, in Dr. Bronson Ing folder

## 2014-07-22 ENCOUNTER — Encounter: Payer: Self-pay | Admitting: Cardiovascular Disease

## 2014-10-13 ENCOUNTER — Encounter (HOSPITAL_COMMUNITY): Payer: Self-pay | Admitting: Cardiovascular Disease

## 2015-07-20 ENCOUNTER — Emergency Department (HOSPITAL_COMMUNITY): Payer: Self-pay

## 2015-07-20 ENCOUNTER — Encounter (HOSPITAL_COMMUNITY): Payer: Self-pay

## 2015-07-20 ENCOUNTER — Emergency Department (HOSPITAL_COMMUNITY)
Admission: EM | Admit: 2015-07-20 | Discharge: 2015-07-20 | Disposition: A | Discharge status: Home / Self Care | Payer: Self-pay | Attending: Emergency Medicine | Admitting: Emergency Medicine

## 2015-07-20 DIAGNOSIS — R109 Unspecified abdominal pain: Secondary | ICD-10-CM

## 2015-07-20 DIAGNOSIS — Z9889 Other specified postprocedural states: Secondary | ICD-10-CM | POA: Insufficient documentation

## 2015-07-20 DIAGNOSIS — Z8669 Personal history of other diseases of the nervous system and sense organs: Secondary | ICD-10-CM | POA: Insufficient documentation

## 2015-07-20 DIAGNOSIS — N201 Calculus of ureter: Secondary | ICD-10-CM | POA: Insufficient documentation

## 2015-07-20 DIAGNOSIS — Z7982 Long term (current) use of aspirin: Secondary | ICD-10-CM | POA: Insufficient documentation

## 2015-07-20 DIAGNOSIS — Z951 Presence of aortocoronary bypass graft: Secondary | ICD-10-CM | POA: Insufficient documentation

## 2015-07-20 DIAGNOSIS — E785 Hyperlipidemia, unspecified: Secondary | ICD-10-CM | POA: Insufficient documentation

## 2015-07-20 DIAGNOSIS — Z87442 Personal history of urinary calculi: Secondary | ICD-10-CM | POA: Insufficient documentation

## 2015-07-20 DIAGNOSIS — Z79899 Other long term (current) drug therapy: Secondary | ICD-10-CM | POA: Insufficient documentation

## 2015-07-20 LAB — CBC WITH DIFFERENTIAL/PLATELET
Basophils Absolute: 0 10*3/uL (ref 0.0–0.1)
Basophils Relative: 0 %
Eosinophils Absolute: 0.2 10*3/uL (ref 0.0–0.7)
Eosinophils Relative: 1 %
HEMATOCRIT: 44.7 % (ref 39.0–52.0)
HEMOGLOBIN: 14.9 g/dL (ref 13.0–17.0)
LYMPHS ABS: 1.4 10*3/uL (ref 0.7–4.0)
Lymphocytes Relative: 13 %
MCH: 30 pg (ref 26.0–34.0)
MCHC: 33.3 g/dL (ref 30.0–36.0)
MCV: 89.9 fL (ref 78.0–100.0)
Monocytes Absolute: 1.1 10*3/uL — ABNORMAL HIGH (ref 0.1–1.0)
Monocytes Relative: 10 %
NEUTROS PCT: 75 %
Neutro Abs: 8.1 10*3/uL — ABNORMAL HIGH (ref 1.7–7.7)
Platelets: 151 10*3/uL (ref 150–400)
RBC: 4.97 MIL/uL (ref 4.22–5.81)
RDW: 12.2 % (ref 11.5–15.5)
WBC: 10.8 10*3/uL — AB (ref 4.0–10.5)

## 2015-07-20 LAB — URINALYSIS, ROUTINE W REFLEX MICROSCOPIC
Bilirubin Urine: NEGATIVE
Glucose, UA: NEGATIVE mg/dL
Hgb urine dipstick: NEGATIVE
Ketones, ur: NEGATIVE mg/dL
Leukocytes, UA: NEGATIVE
Nitrite: NEGATIVE
Protein, ur: NEGATIVE mg/dL
Specific Gravity, Urine: 1.02 (ref 1.005–1.030)
UROBILINOGEN UA: 0.2 mg/dL (ref 0.0–1.0)
pH: 6 (ref 5.0–8.0)

## 2015-07-20 LAB — BASIC METABOLIC PANEL
Anion gap: 6 (ref 5–15)
BUN: 19 mg/dL (ref 6–20)
CHLORIDE: 103 mmol/L (ref 101–111)
CO2: 26 mmol/L (ref 22–32)
Calcium: 8.7 mg/dL — ABNORMAL LOW (ref 8.9–10.3)
Creatinine, Ser: 1.6 mg/dL — ABNORMAL HIGH (ref 0.61–1.24)
GFR calc Af Amer: 56 mL/min — ABNORMAL LOW (ref >60)
GFR calc non Af Amer: 48 mL/min — ABNORMAL LOW (ref >60)
GLUCOSE: 156 mg/dL — AB (ref 65–99)
Potassium: 3.7 mmol/L (ref 3.5–5.1)
Sodium: 135 mmol/L (ref 135–145)

## 2015-07-20 IMAGING — CT CT RENAL STONE PROTOCOL
2 of 3 series · 16 of 46 positions shown, 18 images · non-contrast
Comparison: Abdominal ultrasound dated [DATE].

CLINICAL DATA: Patient complains of flank pain. History of kidney
stones.

EXAM:
CT ABDOMEN AND PELVIS WITHOUT CONTRAST
TECHNIQUE: Multidetector CT imaging of the abdomen and pelvis was performed
following the standard protocol without IV contrast.

[Series 2: standard/full over (age)lbs 5.0 · axial · 0.74mm/px · z∈[+1248,+1658]mm · 13 of 96 slices shown, 15 images]
[im 7/96  soft-tissue]
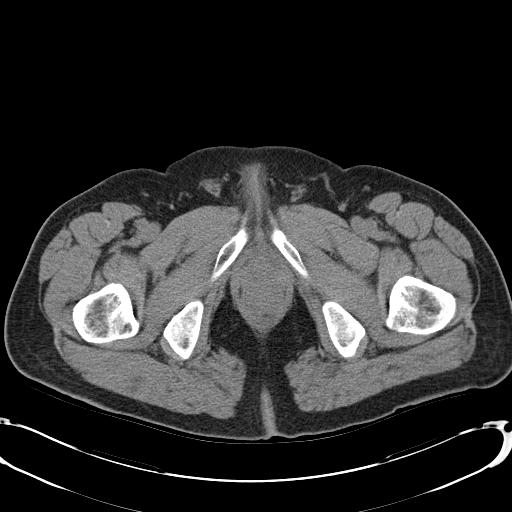
[im 7/96  bone]
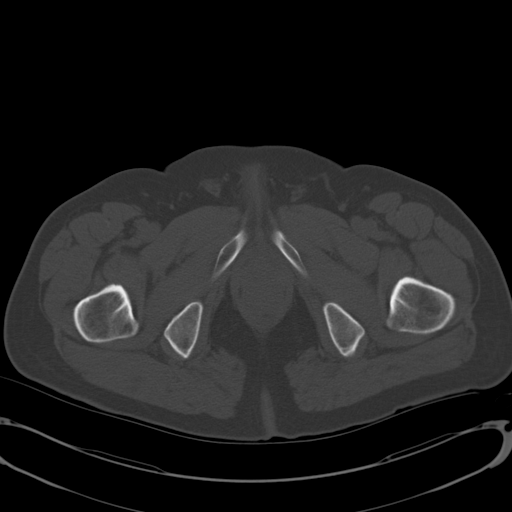
[im 13/96  soft-tissue]
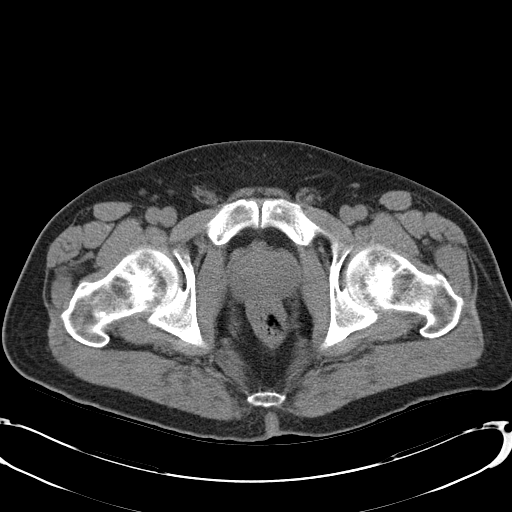
[im 19/96  soft-tissue]
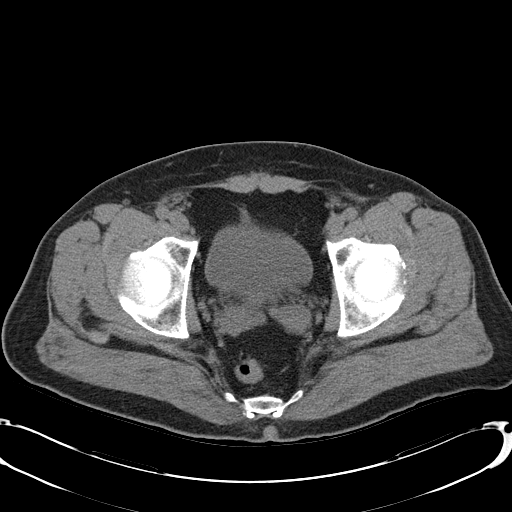
[im 28/96  soft-tissue]
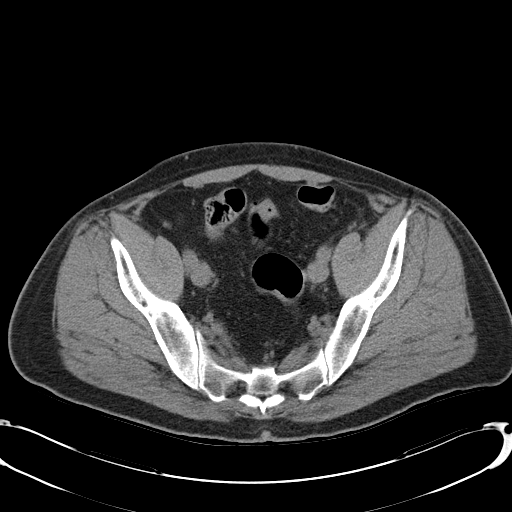
[im 34/96  soft-tissue]
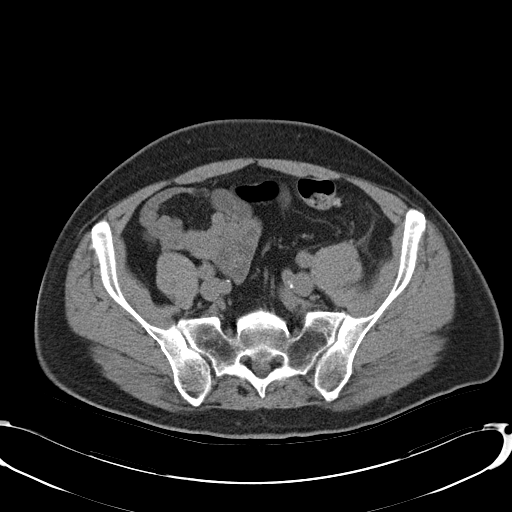
[im 40/96  soft-tissue]
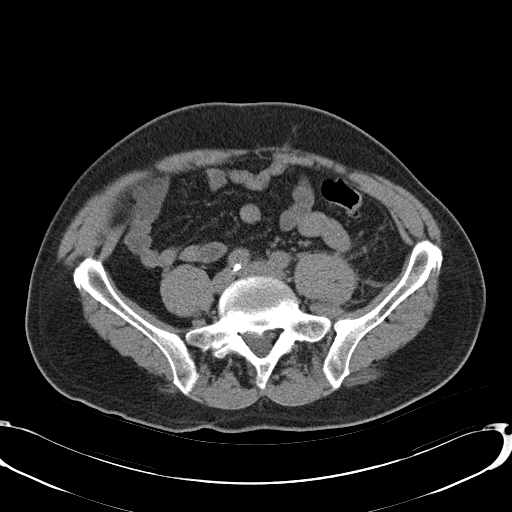
[im 50/96  soft-tissue]
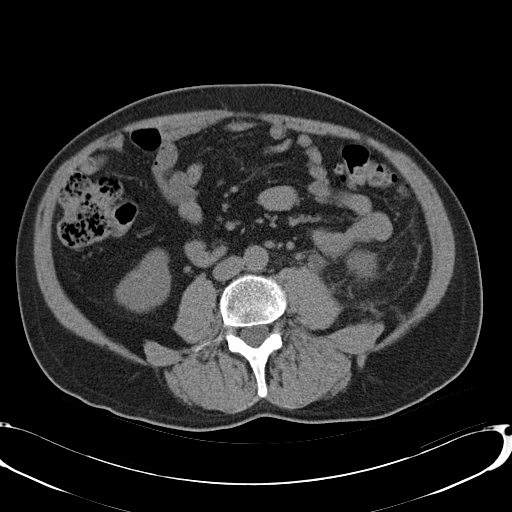
[im 56/96  soft-tissue]
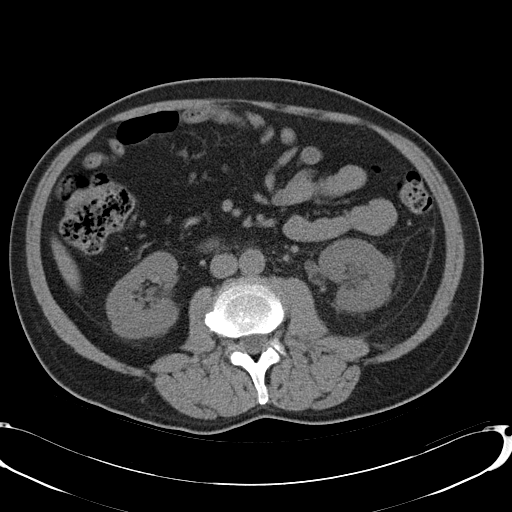
[im 62/96  soft-tissue]
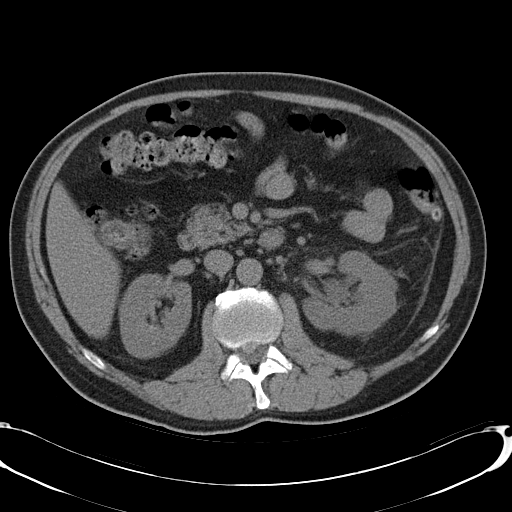
[im 62/96  bone]
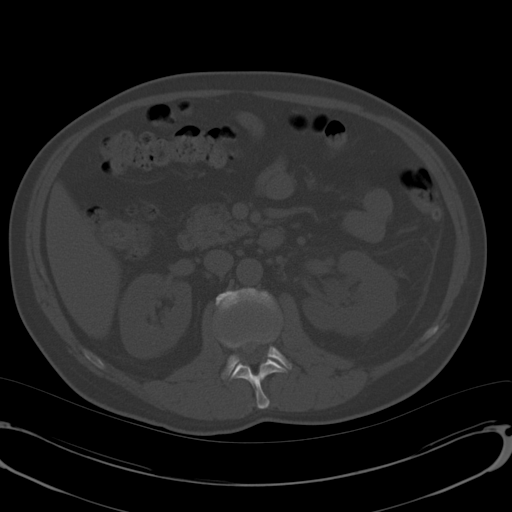
[im 68/96  soft-tissue]
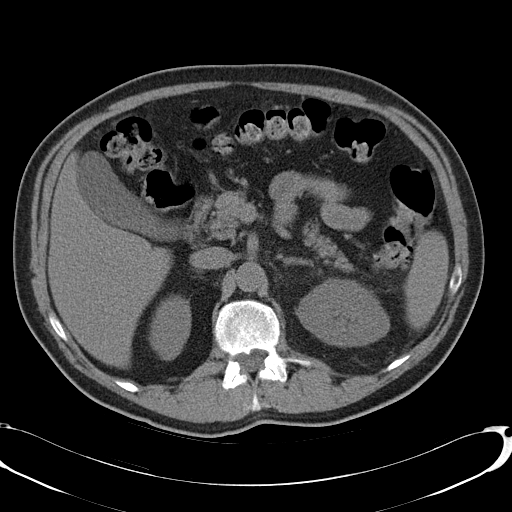
[im 77/96  soft-tissue]
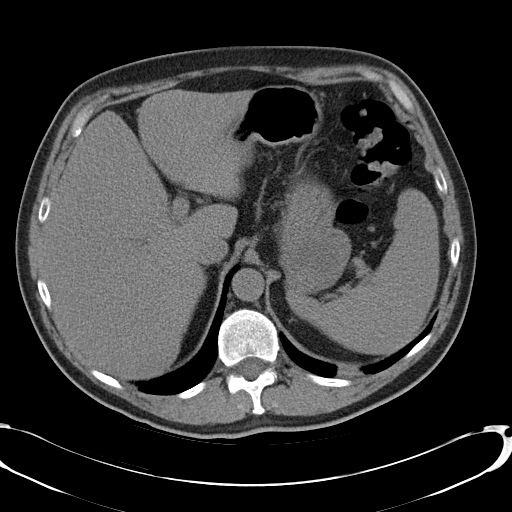
[im 83/96  soft-tissue]
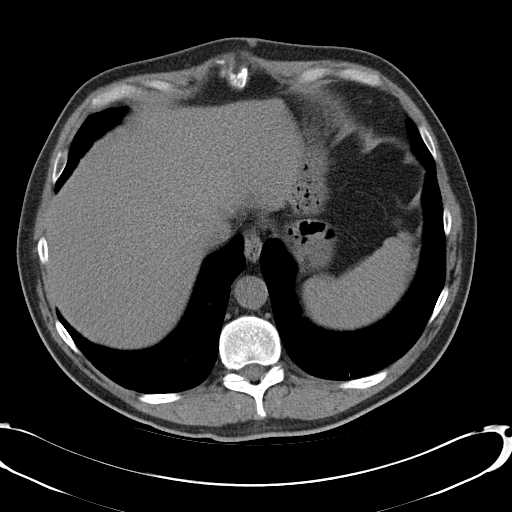
[im 89/96  soft-tissue]
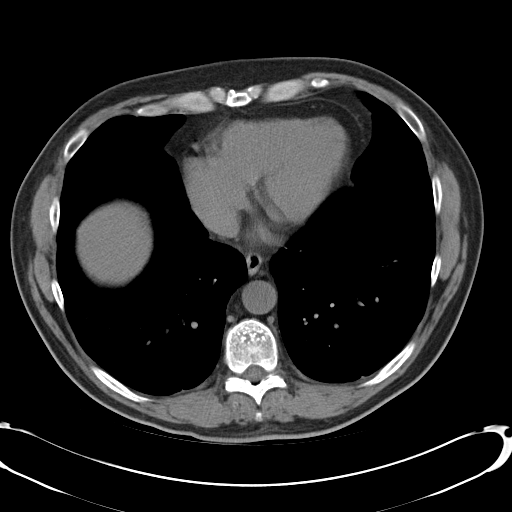

[Series 3: mpr coronal · coronal · 0.75mm/px · 3 of 97 slices shown]
[im 33/97  soft-tissue]
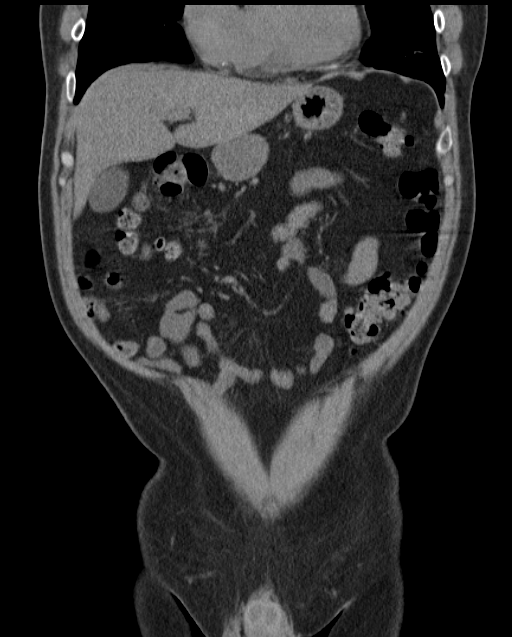
[im 43/97  soft-tissue]
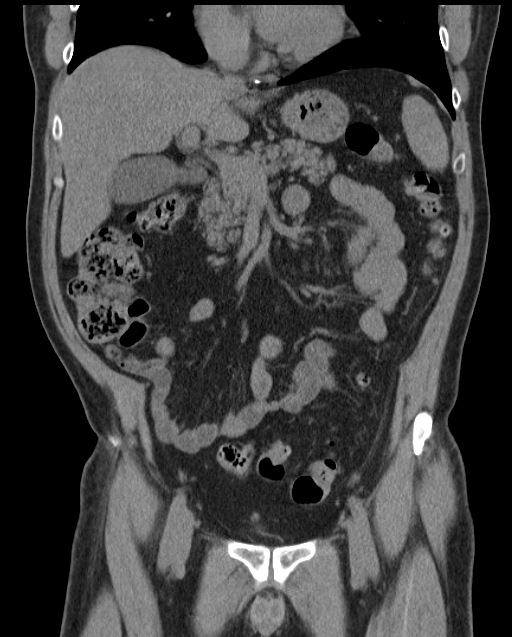
[im 54/97  soft-tissue]
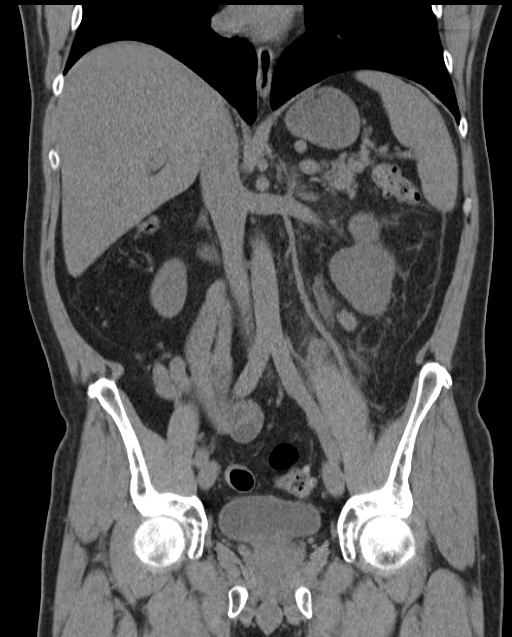

[16 of 46 positions shown; findings below may reference images not displayed]

FINDINGS: There is an obstructing 7 mm calculus within the mid left ureter
with upstream dilation of the ureter and left hydronephrosis. There
is also significant associated left perirenal and periureteral fat
stranding. There is a 3 mm nonobstructing right renal calculus. No
evidence of hydronephrosis or other secondary signs of upper urinary
tract obstruction on the right.

Within limits of unenhanced technique, remaining visualized upper
abdominal organs are unremarkable. Visualized extreme lung bases
clear. There is atherosclerotic disease of the coronary arteries.

No distal ureteral calculi on either side. Urinary bladder appears
normal. The prostate gland is mildly enlarged, measuring 5.0 x
cm. No evidence of appendicitis.
IMPRESSION: Obstructing 7 mm left mid ureteral calculus with associated proximal
hydroureter and hydronephrosis, and significant perinephric
inflammatory fat stranding.

3 mm right renal calculus without evidence of obstructive uropathy.

Coronary artery disease.

Mild enlargement of the prostate gland.

## 2015-07-20 MED ORDER — KETOROLAC TROMETHAMINE 30 MG/ML IJ SOLN
30.0000 mg | Freq: Once | INTRAMUSCULAR | Status: AC
  Administered 2015-07-20: 30 mg via INTRAVENOUS
  Filled 2015-07-20: qty 1

## 2015-07-20 MED ORDER — SODIUM CHLORIDE 0.9 % IV BOLUS (SEPSIS)
1000.0000 mL | Freq: Once | INTRAVENOUS | Status: AC
  Administered 2015-07-20: 1000 mL via INTRAVENOUS

## 2015-07-20 MED ORDER — OXYCODONE-ACETAMINOPHEN 5-325 MG PO TABS: 1.0000 | ORAL_TABLET | ORAL | Status: DC | PRN

## 2015-07-20 MED ORDER — ONDANSETRON HCL 4 MG/2ML IJ SOLN
4.0000 mg | Freq: Once | INTRAMUSCULAR | Status: AC
  Administered 2015-07-20: 4 mg via INTRAVENOUS
  Filled 2015-07-20: qty 2

## 2015-07-20 MED ORDER — IBUPROFEN 800 MG PO TABS: 800.0000 mg | ORAL_TABLET | Freq: Three times a day (TID) | ORAL | Status: DC

## 2015-07-20 MED ORDER — ONDANSETRON HCL 4 MG PO TABS: 4.0000 mg | ORAL_TABLET | Freq: Four times a day (QID) | ORAL | Status: DC

## 2015-07-20 MED ORDER — TAMSULOSIN HCL 0.4 MG PO CAPS: 0.4000 mg | ORAL_CAPSULE | Freq: Every day | ORAL | Status: DC

## 2015-07-20 MED ORDER — MORPHINE SULFATE (PF) 4 MG/ML IV SOLN
4.0000 mg | Freq: Once | INTRAVENOUS | Status: AC
  Administered 2015-07-20: 4 mg via INTRAVENOUS
  Filled 2015-07-20: qty 1

## 2015-07-20 NOTE — ED Notes (Addendum)
Pt c/o constant left flank pain since Tuesday. Pt has been taking Oxycodone that he had left over in the past to help with the pain, but it hasn't been helping him. Pt reports hx of kidney stones. Pt denies nausea, vomiting, fever, trouble with urination or blood in his urine.

## 2015-07-20 NOTE — ED Notes (Signed)
Pt given to water to try for fluid challenge.

## 2015-07-20 NOTE — ED Notes (Signed)
Pt C/O flank pain since Tuesday night, Hx of kidney stones. Denies N/V. Took oxycodone last night and this morning.

## 2015-07-20 NOTE — Discharge Instructions (Signed)
Kidney Stones Follow-up with the urologist this week. Take the pain and nausea medication as prescribed. Return to the ED if you develop new or worsening symptoms. Kidney stones (urolithiasis) are deposits that form inside your kidneys. The intense pain is caused by the stone moving through the urinary tract. When the stone moves, the ureter goes into spasm around the stone. The stone is usually passed in the urine.  CAUSES   A disorder that makes certain neck glands produce too much parathyroid hormone (primary hyperparathyroidism).  A buildup of uric acid crystals, similar to gout in your joints.  Narrowing (stricture) of the ureter.  A kidney obstruction present at birth (congenital obstruction).  Previous surgery on the kidney or ureters.  Numerous kidney infections. SYMPTOMS   Feeling sick to your stomach (nauseous).  Throwing up (vomiting).  Blood in the urine (hematuria).  Pain that usually spreads (radiates) to the groin.  Frequency or urgency of urination. DIAGNOSIS   Taking a history and physical exam.  Blood or urine tests.  CT scan.  Occasionally, an examination of the inside of the urinary bladder (cystoscopy) is performed. TREATMENT   Observation.  Increasing your fluid intake.  Extracorporeal shock wave lithotripsy--This is a noninvasive procedure that uses shock waves to break up kidney stones.  Surgery may be needed if you have severe pain or persistent obstruction. There are various surgical procedures. Most of the procedures are performed with the use of small instruments. Only small incisions are needed to accommodate these instruments, so recovery time is minimized. The size, location, and chemical composition are all important variables that will determine the proper choice of action for you. Talk to your health care provider to better understand your situation so that you will minimize the risk of injury to yourself and your kidney.  HOME CARE  INSTRUCTIONS   Drink enough water and fluids to keep your urine clear or pale yellow. This will help you to pass the stone or stone fragments.  Strain all urine through the provided strainer. Keep all particulate matter and stones for your health care provider to see. The stone causing the pain may be as small as a grain of salt. It is very important to use the strainer each and every time you pass your urine. The collection of your stone will allow your health care provider to analyze it and verify that a stone has actually passed. The stone analysis will often identify what you can do to reduce the incidence of recurrences.  Only take over-the-counter or prescription medicines for pain, discomfort, or fever as directed by your health care provider.  Make a follow-up appointment with your health care provider as directed.  Get follow-up X-rays if required. The absence of pain does not always mean that the stone has passed. It may have only stopped moving. If the urine remains completely obstructed, it can cause loss of kidney function or even complete destruction of the kidney. It is your responsibility to make sure X-rays and follow-ups are completed. Ultrasounds of the kidney can show blockages and the status of the kidney. Ultrasounds are not associated with any radiation and can be performed easily in a matter of minutes. SEEK MEDICAL CARE IF:  You experience pain that is progressive and unresponsive to any pain medicine you have been prescribed. SEEK IMMEDIATE MEDICAL CARE IF:   Pain cannot be controlled with the prescribed medicine.  You have a fever or shaking chills.  The severity or intensity of pain increases over  18 hours and is not relieved by pain medicine.  You develop a new onset of abdominal pain.  You feel faint or pass out.  You are unable to urinate. MAKE SURE YOU:   Understand these instructions.  Will watch your condition.  Will get help right away if you are  not doing well or get worse. Document Released: 10/21/2005 Document Revised: 06/23/2013 Document Reviewed: 03/24/2013 [facility] Patient Information 2015 [place], [place]. This information is not intended to replace advice given to you by your health care provider. Make sure you discuss any questions you have with your health care provider.

## 2015-07-20 NOTE — ED Notes (Signed)
Pt tolerated fluids 

## 2015-07-20 NOTE — ED Notes (Signed)
MD at bedside. 

## 2015-07-20 NOTE — ED Provider Notes (Signed)
CSN: [id number]     Arrival date & time 07/20/15  9629 History  This chart was scribed for Ezequiel Essex, MD by Irene Pap, ED Scribe. This patient was seen in room APA03/APA03 and patient care was started at 8:34 AM.    Chief Complaint  Patient presents with  . Flank Pain   The history is provided by the patient. No language interpreter was used.  HPI Comments: Edward Fisher is a 49 y.o. male with hx of ASCVD, nephrolithiasis, coronary artery bypass graft, and kidney stones who presents to the Emergency Department complaining of waxing and waning, non-radiating, left flank pain onset two days ago. Pt states that this feels similar to his past hx of kidney stones and took one oxycodone last night, 1/2 a pill this morning to relief. Pt denies anything makes the pain worse. Pt denies testicular pain, dysuria, hematuria, chest pain, SOB, nausea or vomiting. Denies hx of DM or abdominal surgeries. Pt takes aspirin 81 mg daily.    Past Medical History  Diagnosis Date  . Hyperlipidemia     Not thought to warrant pharmacologic therapy  . Insomnia   . Arteriosclerotic cardiovascular disease (ASCVD)     Onset in 04/2012; CABG in 06/2012  . Nephrolithiasis    Past Surgical History  Procedure Laterality Date  . Rhinoplasty    . Tonsillectomy  ~1971  . Coronary artery bypass graft  06/24/2012    Procedure: CORONARY ARTERY BYPASS GRAFTING (CABG);  Surgeon: [name], MD;  Location: [facility];  Service: Open Heart Surgery;  Laterality: N/A;  Coronary Artery  Bypass X 4 utilizing endoscopic saphenous vein graft and  Left radial Artery Harvest   . Left heart catheterization with coronary angiogram N/A 06/23/2012    Procedure: LEFT HEART CATHETERIZATION WITH CORONARY ANGIOGRAM;  Surgeon: [name], MD;  Location: [facility] CATH LAB;  Service: Cardiovascular;  Laterality: N/A;   Family History  Problem Relation Age of Onset  . Ulcers Father     Peptic ulcer disease  . Hypertension Mother   .  Coronary artery disease Father     Stents  . Coronary artery disease Brother     CABG   Social History  Substance Use Topics  . Smoking status: Never Smoker   . Smokeless tobacco: Former Systems developer    Types: [place] date: 11/04/1998     Comment: 06/22/2012 "quit chewing 10-15 years ago"  . Alcohol Use: No    Review of Systems A complete 10 system review of systems was obtained and all systems are negative except as noted in the HPI and PMH.   Allergies  Review of patient's allergies indicates no known allergies.  Home Medications   Prior to Admission medications   Medication Sig Start Date End Date Taking? Authorizing Provider  acetaminophen (TYLENOL) 500 MG tablet Take 1,000 mg by mouth every 6 (six) hours as needed for moderate pain.   Yes Historical Provider, MD  aspirin 81 MG chewable tablet Chew 81 mg by mouth every evening. TO START DOSAGE IN 6 MONTHS APPROXIMATELY July 2014   Yes Historical Provider, MD  Docusate Calcium (STOOL SOFTENER PO) Take 1 capsule by mouth every evening.   Yes Historical Provider, MD  omega-3 acid ethyl esters (LOVAZA) 1 G capsule Take 1 g by mouth every evening.   Yes Historical Provider, MD  simvastatin (ZOCOR) 40 MG tablet Take 40 mg by mouth every evening.   Yes Historical Provider, MD  ibuprofen (ADVIL,MOTRIN) 800  MG tablet Take 1 tablet (800 mg total) by mouth 3 (three) times daily. 07/20/15   Ezequiel Essex, MD  nitroGLYCERIN (NITROSTAT) 0.4 MG SL tablet Place 1 tablet (0.4 mg total) under the tongue every 5 (five) minutes as needed for chest pain. 06/22/14   Imogene Burn, PA-C  ondansetron (ZOFRAN) 4 MG tablet Take 1 tablet (4 mg total) by mouth every 6 (six) hours. 07/20/15   Ezequiel Essex, MD  oxyCODONE-acetaminophen (PERCOCET/ROXICET) 5-325 MG per tablet Take 1 tablet by mouth every 4 (four) hours as needed for severe pain. 07/20/15   Ezequiel Essex, MD  tamsulosin (FLOMAX) 0.4 MG CAPS capsule Take 1 capsule (0.4 mg total) by mouth daily.  07/20/15   Ezequiel Essex, MD   BP 130/74 mmHg  Pulse 79  Temp(Src) 97.9 F (36.6 C) (Oral)  Resp 16  Ht 5\' 11"  (1.803 m)  Wt 180 lb (81.647 kg)  BMI 25.12 kg/m2  SpO2 100%  Physical Exam  Constitutional: He is oriented to person, place, and time. He appears well-developed and well-nourished. No distress.  HENT:  Head: Normocephalic and atraumatic.  Mouth/Throat: Oropharynx is clear and moist. No oropharyngeal exudate.  Eyes: Conjunctivae and EOM are normal. Pupils are equal, round, and reactive to light.  Neck: Normal range of motion. Neck supple.  No meningismus.  Cardiovascular: Normal rate, regular rhythm, normal heart sounds and intact distal pulses.   No murmur heard. Pulmonary/Chest: Effort normal and breath sounds normal. No respiratory distress.  Abdominal: Soft. There is no tenderness. There is CVA tenderness. There is no rebound and no guarding.  Left CVA tenderness  Genitourinary:  No testicular tenderness  Musculoskeletal: Normal range of motion. He exhibits no edema or tenderness.  Neurological: He is alert and oriented to person, place, and time. No cranial nerve deficit. He exhibits normal muscle tone. Coordination normal.  No ataxia on finger to nose bilaterally. No pronator drift. 5/5 strength throughout. CN 2-12 intact. Negative Romberg. Equal grip strength. Sensation intact. Gait is normal.   Skin: Skin is warm.  Psychiatric: He has a normal mood and affect. His behavior is normal.  Nursing note and vitals reviewed.   ED Course  Procedures (including critical care time) DIAGNOSTIC STUDIES: Oxygen Saturation is 100% on RA, normal by my interpretation.    COORDINATION OF CARE: 8:40 AM-Discussed treatment plan which includes UA with pt at bedside and pt agreed to plan.   Labs Review Labs Reviewed  CBC WITH DIFFERENTIAL/PLATELET - Abnormal; Notable for the following:    WBC 10.8 (*)    Neutro Abs 8.1 (*)    Monocytes Absolute 1.1 (*)    All other  components within normal limits  BASIC METABOLIC PANEL - Abnormal; Notable for the following:    Glucose, Bld 156 (*)    Creatinine, Ser 1.60 (*)    Calcium 8.7 (*)    GFR calc non Af Amer 48 (*)    GFR calc Af Amer 56 (*)    All other components within normal limits  URINALYSIS, ROUTINE W REFLEX MICROSCOPIC (NOT AT [facility])    Imaging Review Ct Renal Stone Study  07/20/2015   CLINICAL DATA:  Patient complains of flank pain. History of kidney stones.  EXAM: CT ABDOMEN AND PELVIS WITHOUT CONTRAST  TECHNIQUE: Multidetector CT imaging of the abdomen and pelvis was performed following the standard protocol without IV contrast.  COMPARISON:  Abdominal ultrasound dated 06/26/2006.  FINDINGS: There is an obstructing 7 mm calculus within the mid left ureter with  upstream dilation of the ureter and left hydronephrosis. There is also significant associated left perirenal and periureteral fat stranding. There is a 3 mm nonobstructing right renal calculus. No evidence of hydronephrosis or other secondary signs of upper urinary tract obstruction on the right.  Within limits of unenhanced technique, remaining visualized upper abdominal organs are unremarkable. Visualized extreme lung bases clear. There is atherosclerotic disease of the coronary arteries.  No distal ureteral calculi on either side. Urinary bladder appears normal. The prostate gland is mildly enlarged, measuring 5.0 x 3.4 cm. No evidence of appendicitis.  IMPRESSION: Obstructing 7 mm left mid ureteral calculus with associated proximal hydroureter and hydronephrosis, and significant perinephric inflammatory fat stranding.  3 mm right renal calculus without evidence of obstructive uropathy.  Coronary artery disease.  Mild enlargement of the prostate gland.   Electronically Signed   By: Fidela Salisbury M.D.   On: 07/20/2015 09:23      EKG Interpretation None      MDM   Final diagnoses:  Flank pain  Ureteral stone   Flank pain similar to  previous kidney stones. No nausea, vomiting, fever. No dysuria or hematuria.  Left CVA tenderness, abdomen soft, no testicular tenderness  UA is negative for infection or hematuria. Cr 1.6 CT shows obstructing 7 mm left mid ureteral calculus with hydronephrosis. 3 mm right renal calculus.  Pain and vomiting are controlled in the ED.  Kidney stone without evidence of infection. Follow-up with urology. Symptom control discussed. Return precautions discussed.  I personally performed the services described in this documentation, which was scribed in my presence. The recorded information has been reviewed and is accurate.     Ezequiel Essex, MD 07/20/15 [phone]

## 2015-08-15 ENCOUNTER — Ambulatory Visit: Payer: Self-pay | Admitting: Physician Assistant

## 2015-08-15 ENCOUNTER — Encounter: Payer: Self-pay | Admitting: Physician Assistant

## 2015-08-15 VITALS — BP 112/68 | HR 78 | Temp 97.9°F | Ht 69.0 in | Wt 183.2 lb

## 2015-08-15 DIAGNOSIS — R350 Frequency of micturition: Secondary | ICD-10-CM

## 2015-08-15 DIAGNOSIS — N289 Disorder of kidney and ureter, unspecified: Secondary | ICD-10-CM

## 2015-08-15 DIAGNOSIS — N211 Calculus in urethra: Secondary | ICD-10-CM

## 2015-08-15 LAB — POCT URINALYSIS DIPSTICK
Bilirubin, UA: NEGATIVE
GLUCOSE UA: NEGATIVE
KETONES UA: NEGATIVE
Leukocytes, UA: NEGATIVE
Nitrite, UA: NEGATIVE
Protein, UA: NEGATIVE
Urobilinogen, UA: 0.2
pH, UA: 6

## 2015-08-15 NOTE — Progress Notes (Signed)
BP 112/68 mmHg  Pulse 78  Temp(Src) 97.9 F (36.6 C)  Ht 5\' 9"  (1.753 m)  Wt 183 lb 3.2 oz (83.099 kg)  BMI 27.04 kg/m2  SpO2 98%   Subjective:    Patient ID: Edward Fisher, male    DOB: [birth year], 49 y.o.   MRN: [id number]  HPI: Edward Fisher is a 49 y.o. male presenting on 08/15/2015 for Urinary Tract Infection   HPI   Pt went to ER on 07/20/15 and was dx with kidney stone. He was told to go to urology for further eval and treat.  He says he called and was told it would cost $250 so he didn't go.  ER records including CT scan were reviewed.  Relevant past medical, surgical, family and social history reviewed and updated as indicated. Interim medical history since our last visit reviewed. Allergies and medications reviewed and updated.  Review of Systems  Constitutional: Negative for fever, chills, appetite change, fatigue and unexpected weight change.  HENT: Positive for sore throat. Negative for congestion, dental problem, drooling, ear pain, facial swelling, hearing loss, mouth sores, sneezing, trouble swallowing and voice change.   Eyes: Negative for pain, discharge, redness, itching and visual disturbance.  Respiratory: Negative for cough, choking, shortness of breath and wheezing.   Cardiovascular: Negative for chest pain, palpitations and leg swelling.  Gastrointestinal: Negative for vomiting, abdominal pain, diarrhea, constipation and blood in stool.  Endocrine: Negative for cold intolerance, heat intolerance and polydipsia.  Genitourinary: Positive for frequency. Negative for dysuria, hematuria and decreased urine volume.  Musculoskeletal: Negative for back pain, arthralgias and gait problem.  Skin: Negative for rash.  Allergic/Immunologic: Negative for environmental allergies.  Neurological: Negative for seizures, syncope, light-headedness and headaches.  Hematological: Negative for adenopathy.  Psychiatric/Behavioral: Negative for suicidal ideas, dysphoric mood and  agitation. The patient is not nervous/anxious.     Per HPI unless specifically indicated above     Objective:    BP 112/68 mmHg  Pulse 78  Temp(Src) 97.9 F (36.6 C)  Ht 5\' 9"  (1.753 m)  Wt 183 lb 3.2 oz (83.099 kg)  BMI 27.04 kg/m2  SpO2 98%  Wt Readings from Last 3 Encounters:  08/15/15 183 lb 3.2 oz (83.099 kg)  07/20/15 180 lb (81.647 kg)  07/12/14 186 lb (84.369 kg)    Physical Exam  Constitutional: He is oriented to person, place, and time. He appears well-developed and well-nourished.  Neck: Neck supple.  Cardiovascular: Normal rate and regular rhythm.   Pulmonary/Chest: Effort normal and breath sounds normal.  Abdominal: Soft. He exhibits no mass. There is no tenderness.  Musculoskeletal: He exhibits no edema.  Lymphadenopathy:    He has no cervical adenopathy.  Neurological: He is alert and oriented to person, place, and time.  Skin: Skin is warm and dry.  Psychiatric: He has a normal mood and affect. His behavior is normal. Thought content normal.  Vitals reviewed.   Results for orders placed or performed in visit on 08/15/15  POCT Urinalysis Dipstick  Result Value Ref Range   Color, UA lt yellow    Clarity, UA clear    Glucose, UA n    Bilirubin, UA n    Ketones, UA n    Spec Grav, UA <=1.005    Blood, UA trace-lysed    pH, UA 6.0    Protein, UA n    Urobilinogen, UA 0.2    Nitrite, UA n    Leukocytes, UA Negative Negative  Assessment & Plan:   Kidney stone with obstruction- Pt needs to get to urology!  He is given cone disc application and referral is made. Check bmp today when he leaves office.  He will f/u here as scheduled.

## 2015-08-16 LAB — BASIC METABOLIC PANEL
BUN: 20 mg/dL (ref 7–25)
CALCIUM: 9.3 mg/dL (ref 8.6–10.3)
CO2: 28 mmol/L (ref 20–31)
CREATININE: 1.29 mg/dL (ref 0.70–1.33)
Chloride: 103 mmol/L (ref 98–110)
GLUCOSE: 108 mg/dL — AB (ref 65–99)
Potassium: 3.8 mmol/L (ref 3.5–5.3)
SODIUM: 138 mmol/L (ref 135–146)

## 2015-08-18 ENCOUNTER — Ambulatory Visit (INDEPENDENT_AMBULATORY_CARE_PROVIDER_SITE_OTHER): Payer: Self-pay | Admitting: Urology

## 2015-08-18 DIAGNOSIS — N201 Calculus of ureter: Secondary | ICD-10-CM

## 2015-08-21 ENCOUNTER — Other Ambulatory Visit: Payer: Self-pay | Admitting: Urology

## 2015-08-22 ENCOUNTER — Other Ambulatory Visit: Payer: Self-pay

## 2015-08-22 ENCOUNTER — Encounter (HOSPITAL_COMMUNITY)
Admission: RE | Admit: 2015-08-22 | Discharge: 2015-08-22 | Disposition: A | Discharge status: Home / Self Care | Payer: Self-pay | Source: Ambulatory Visit | Attending: Urology | Admitting: Urology

## 2015-08-22 ENCOUNTER — Encounter (HOSPITAL_COMMUNITY): Payer: Self-pay

## 2015-08-22 DIAGNOSIS — Z01818 Encounter for other preprocedural examination: Secondary | ICD-10-CM | POA: Insufficient documentation

## 2015-08-22 DIAGNOSIS — N201 Calculus of ureter: Secondary | ICD-10-CM | POA: Insufficient documentation

## 2015-08-22 LAB — CBC
HEMATOCRIT: 43.3 % (ref 39.0–52.0)
HEMOGLOBIN: 14.6 g/dL (ref 13.0–17.0)
MCH: 30.4 pg (ref 26.0–34.0)
MCHC: 33.7 g/dL (ref 30.0–36.0)
MCV: 90 fL (ref 78.0–100.0)
Platelets: 175 10*3/uL (ref 150–400)
RBC: 4.81 MIL/uL (ref 4.22–5.81)
RDW: 12 % (ref 11.5–15.5)
WBC: 7.7 10*3/uL (ref 4.0–10.5)

## 2015-08-22 NOTE — Patient Instructions (Signed)
Edward Fisher  08/22/2015     @PREFPERIOPPHARMACY @   Your procedure is scheduled on 08/23/2015  Report to [facility] at 12:40 A.M.  Call this number if you have problems the morning of surgery:  [phone]   Remember:  Do not eat food or drink liquids after midnight.  Take these medicines the morning of surgery with A SIP OF WATER Flomax, oxycodone   Do not wear jewelry, make-up or nail polish.  Do not wear lotions, powders, or perfumes.  You may wear deodorant.  Do not shave 48 hours prior to surgery.  Men may shave face and neck.  Do not bring valuables to the hospital.  [facility] is not responsible for any belongings or valuables.  Contacts, dentures or bridgework may not be worn into surgery.  Leave your suitcase in the car.  After surgery it may be brought to your room.  For patients admitted to the hospital, discharge time will be determined by your treatment team.  Patients discharged the day of surgery will not be allowed to drive home.    Please read over the following fact sheets that you were given. Anesthesia Post-op Instructions     PATIENT INSTRUCTIONS POST-ANESTHESIA  IMMEDIATELY FOLLOWING SURGERY:  Do not drive or operate machinery for the first twenty four hours after surgery.  Do not make any important decisions for twenty four hours after surgery or while taking narcotic pain medications or sedatives.  If you develop intractable nausea and vomiting or a severe headache please notify your doctor immediately.  FOLLOW-UP:  Please make an appointment with your surgeon as instructed. You do not need to follow up with anesthesia unless specifically instructed to do so.  WOUND CARE INSTRUCTIONS (if applicable):  Keep a dry clean dressing on the anesthesia/puncture wound site if there is drainage.  Once the wound has quit draining you may leave it open to air.  Generally you should leave the bandage intact for twenty four hours unless there is drainage.  If  the epidural site drains for more than 36-48 hours please call the anesthesia department.  QUESTIONS?:  Please feel free to call your physician or the hospital operator if you have any questions, and they will be happy to assist you.      Cystoscopy Cystoscopy is a procedure that is used to help your caregiver diagnose and sometimes treat conditions that affect your lower urinary tract. Your lower urinary tract includes your bladder and the tube through which urine passes from your bladder out of your body (urethra). Cystoscopy is performed with a thin, tube-shaped instrument (cystoscope). The cystoscope has lenses and a light at the end so that your caregiver can see inside your bladder. The cystoscope is inserted at the entrance of your urethra. Your caregiver guides it through your urethra and into your bladder. There are two main types of cystoscopy:  Flexible cystoscopy (with a flexible cystoscope).  Rigid cystoscopy (with a rigid cystoscope). Cystoscopy may be recommended for many conditions, including:  Urinary tract infections.  Blood in your urine (hematuria).  Loss of bladder control (urinary incontinence) or overactive bladder.  Unusual cells found in a urine sample.  Urinary blockage.  Painful urination. Cystoscopy may also be done to remove a sample of your tissue to be checked under a microscope (biopsy). It may also be done to remove or destroy bladder stones. LET YOUR CAREGIVER KNOW ABOUT:  Allergies to food or medicine.  Medicines taken, including vitamins, herbs, eyedrops, over-the-counter  medicines, and creams.  Use of steroids (by mouth or creams).  Previous problems with anesthetics or numbing medicines.  History of bleeding problems or blood clots.  Previous surgery.  Other health problems, including diabetes and kidney problems.  Possibility of pregnancy, if this applies. PROCEDURE The area around the opening to your urethra will be cleaned. A  medicine to numb your urethra (local anesthetic) is used. If a tissue sample or stone is removed during the procedure, you may be given a medicine to make you sleep (general anesthetic). Your caregiver will gently insert the tip of the cystoscope into your urethra. The cystoscope will be slowly glided through your urethra and into your bladder. Sterile fluid will flow through the cystoscope and into your bladder. The fluid will expand and stretch your bladder. This gives your caregiver a better view of your bladder walls. The procedure lasts about 15-20 minutes. AFTER THE PROCEDURE If a local anesthetic is used, you will be allowed to go home as soon as you are ready. If a general anesthetic is used, you will be taken to a recovery area until you are stable. You may have temporary bleeding and burning on urination.   This information is not intended to replace advice given to you by your health care provider. Make sure you discuss any questions you have with your health care provider.   Document Released: 10/18/2000 Document Revised: 11/11/2014 Document Reviewed: 04/13/2012 Elsevier Interactive Patient Education [employer].

## 2015-08-23 ENCOUNTER — Encounter (HOSPITAL_COMMUNITY)
Admission: RE | Disposition: A | Discharge status: Home / Self Care | Payer: Self-pay | Source: Ambulatory Visit | Attending: Urology

## 2015-08-23 ENCOUNTER — Ambulatory Visit (HOSPITAL_COMMUNITY): Payer: Self-pay

## 2015-08-23 ENCOUNTER — Encounter (HOSPITAL_COMMUNITY): Payer: Self-pay | Admitting: *Deleted

## 2015-08-23 ENCOUNTER — Ambulatory Visit (HOSPITAL_COMMUNITY): Payer: Self-pay | Admitting: Anesthesiology

## 2015-08-23 ENCOUNTER — Ambulatory Visit (HOSPITAL_COMMUNITY)
Admission: RE | Admit: 2015-08-23 | Discharge: 2015-08-23 | Disposition: A | Discharge status: Home / Self Care | Payer: Self-pay | Source: Ambulatory Visit | Attending: Urology | Admitting: Urology

## 2015-08-23 DIAGNOSIS — Z720 Tobacco use: Secondary | ICD-10-CM | POA: Insufficient documentation

## 2015-08-23 DIAGNOSIS — N201 Calculus of ureter: Secondary | ICD-10-CM

## 2015-08-23 DIAGNOSIS — I10 Essential (primary) hypertension: Secondary | ICD-10-CM | POA: Insufficient documentation

## 2015-08-23 DIAGNOSIS — G4700 Insomnia, unspecified: Secondary | ICD-10-CM | POA: Insufficient documentation

## 2015-08-23 DIAGNOSIS — Z7982 Long term (current) use of aspirin: Secondary | ICD-10-CM | POA: Insufficient documentation

## 2015-08-23 DIAGNOSIS — E785 Hyperlipidemia, unspecified: Secondary | ICD-10-CM | POA: Insufficient documentation

## 2015-08-23 DIAGNOSIS — I2510 Atherosclerotic heart disease of native coronary artery without angina pectoris: Secondary | ICD-10-CM | POA: Insufficient documentation

## 2015-08-23 DIAGNOSIS — N132 Hydronephrosis with renal and ureteral calculous obstruction: Secondary | ICD-10-CM | POA: Insufficient documentation

## 2015-08-23 DIAGNOSIS — Z79899 Other long term (current) drug therapy: Secondary | ICD-10-CM | POA: Insufficient documentation

## 2015-08-23 DIAGNOSIS — Z951 Presence of aortocoronary bypass graft: Secondary | ICD-10-CM | POA: Insufficient documentation

## 2015-08-23 HISTORY — PX: HOLMIUM LASER APPLICATION: SHX5852

## 2015-08-23 HISTORY — PX: CYSTOSCOPY W/ RETROGRADES: SHX1426

## 2015-08-23 HISTORY — PX: CYSTOSCOPY WITH URETEROSCOPY, STONE BASKETRY AND STENT PLACEMENT: SHX6378

## 2015-08-23 IMAGING — RF DG RETROGRADE PYELOGRAM
1 series · 4 of 4 positions shown · non-contrast
Comparison: CT [DATE]

CLINICAL DATA: 49-year-old male with a history of retrograde
pyelogram and stone extraction

EXAM:
RETROGRADE PYELOGRAM

[Series 1: run · 4 of 4 slices shown]
[im 1/4]
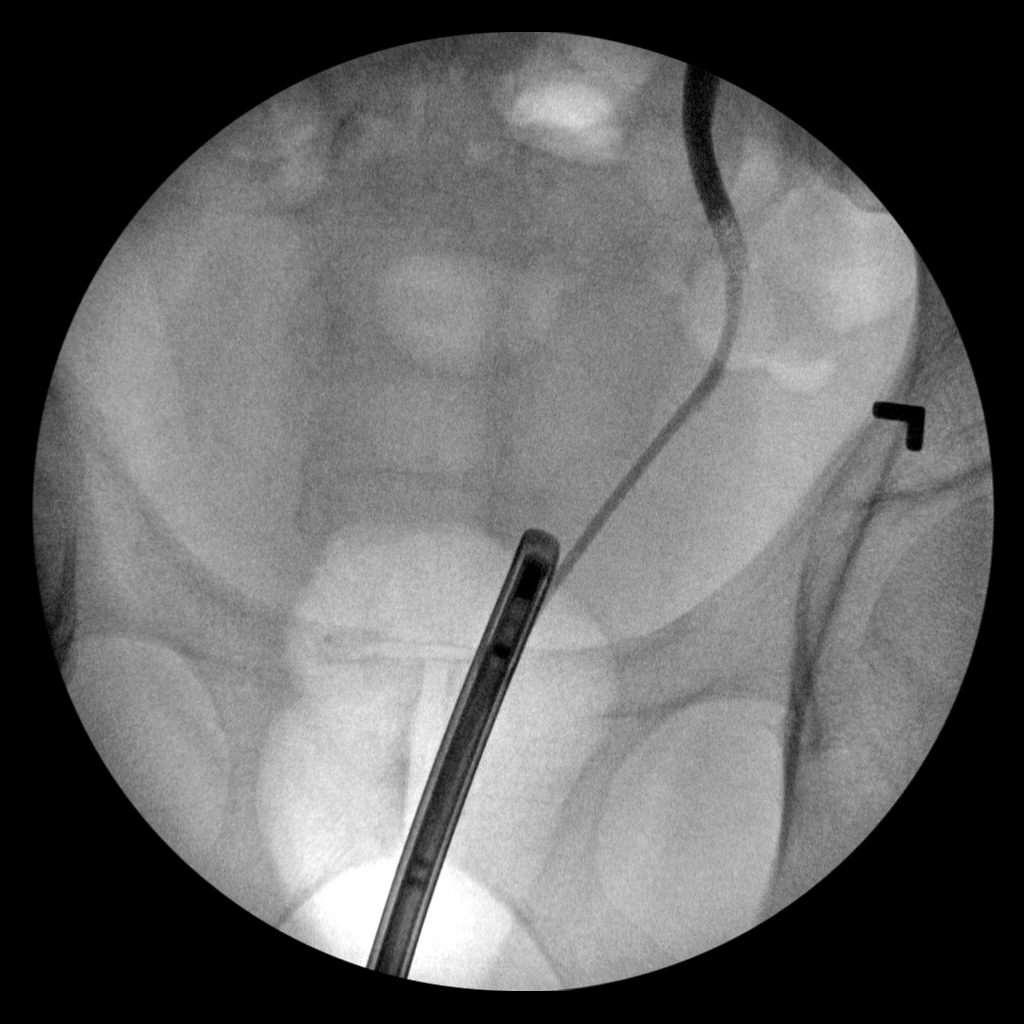
[im 2/4]
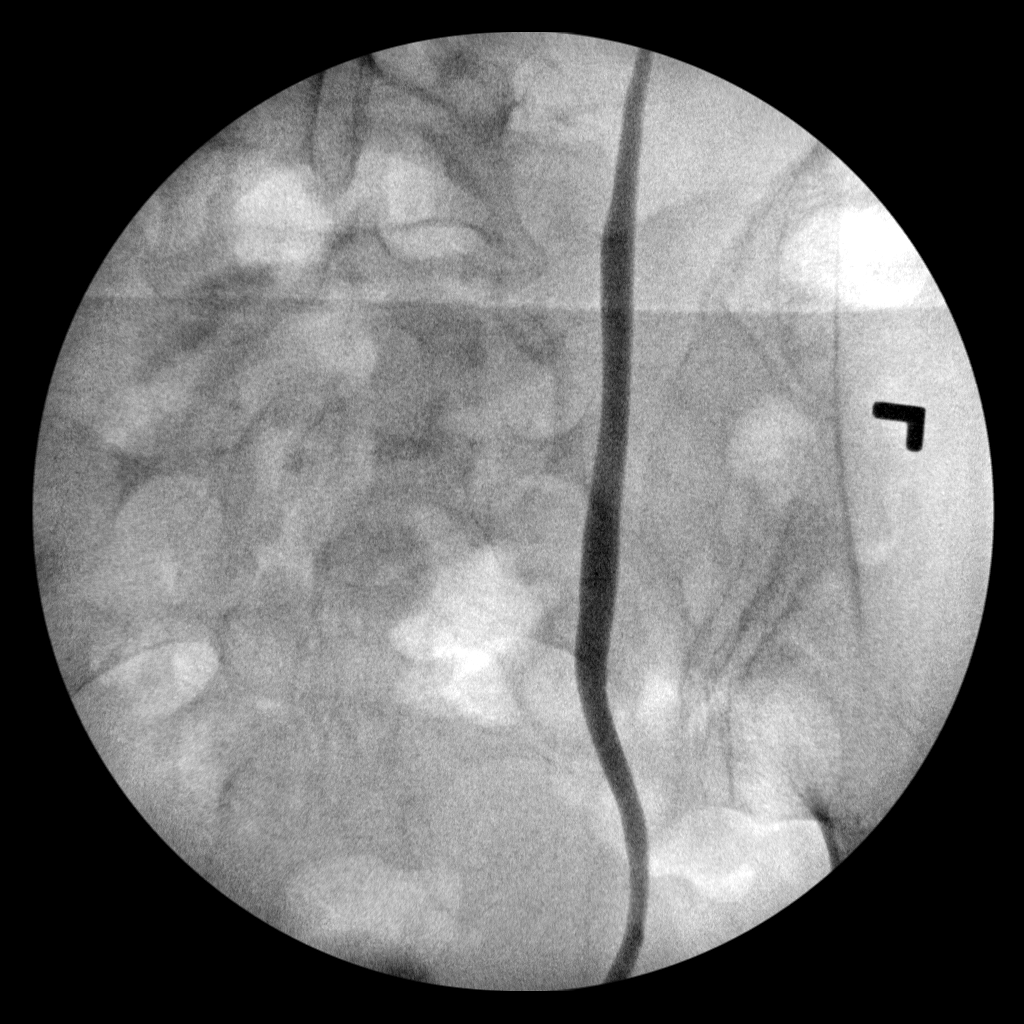
[im 3/4]
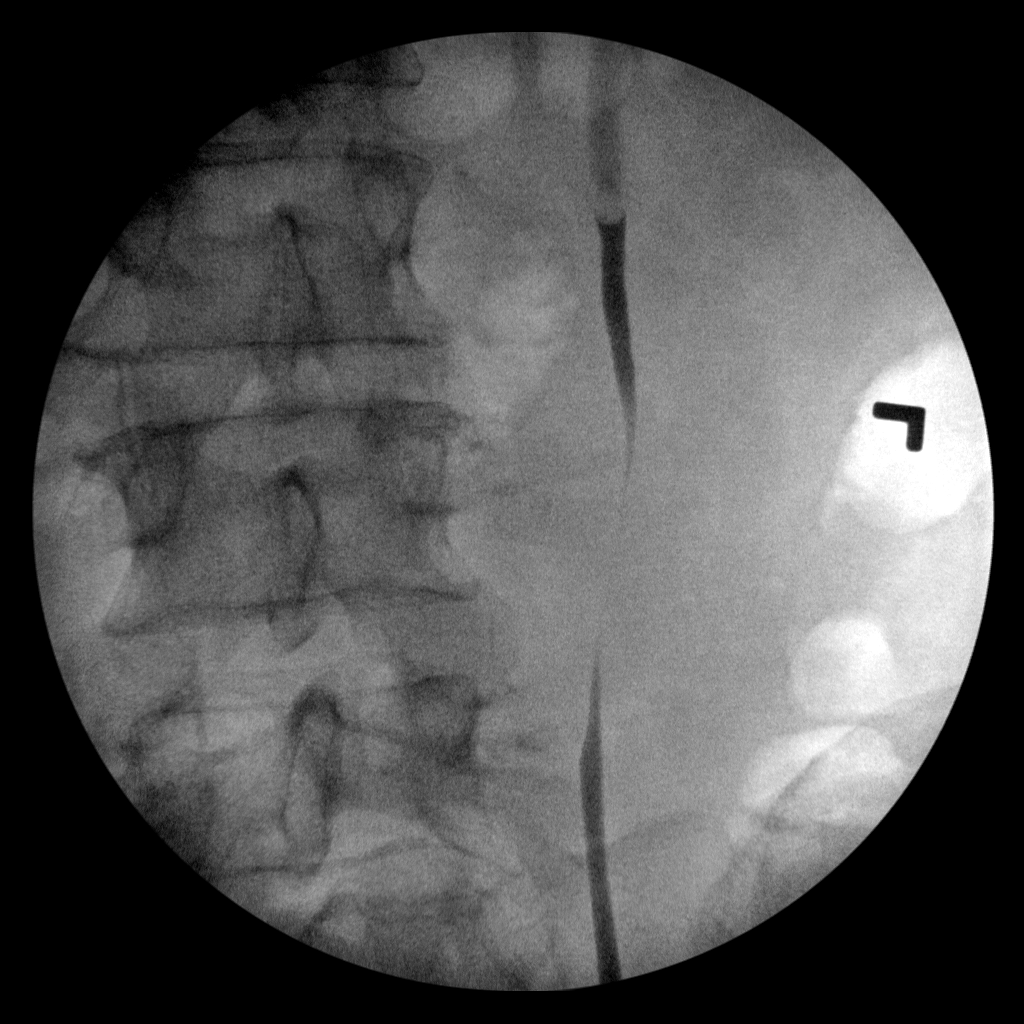
[im 4/4]
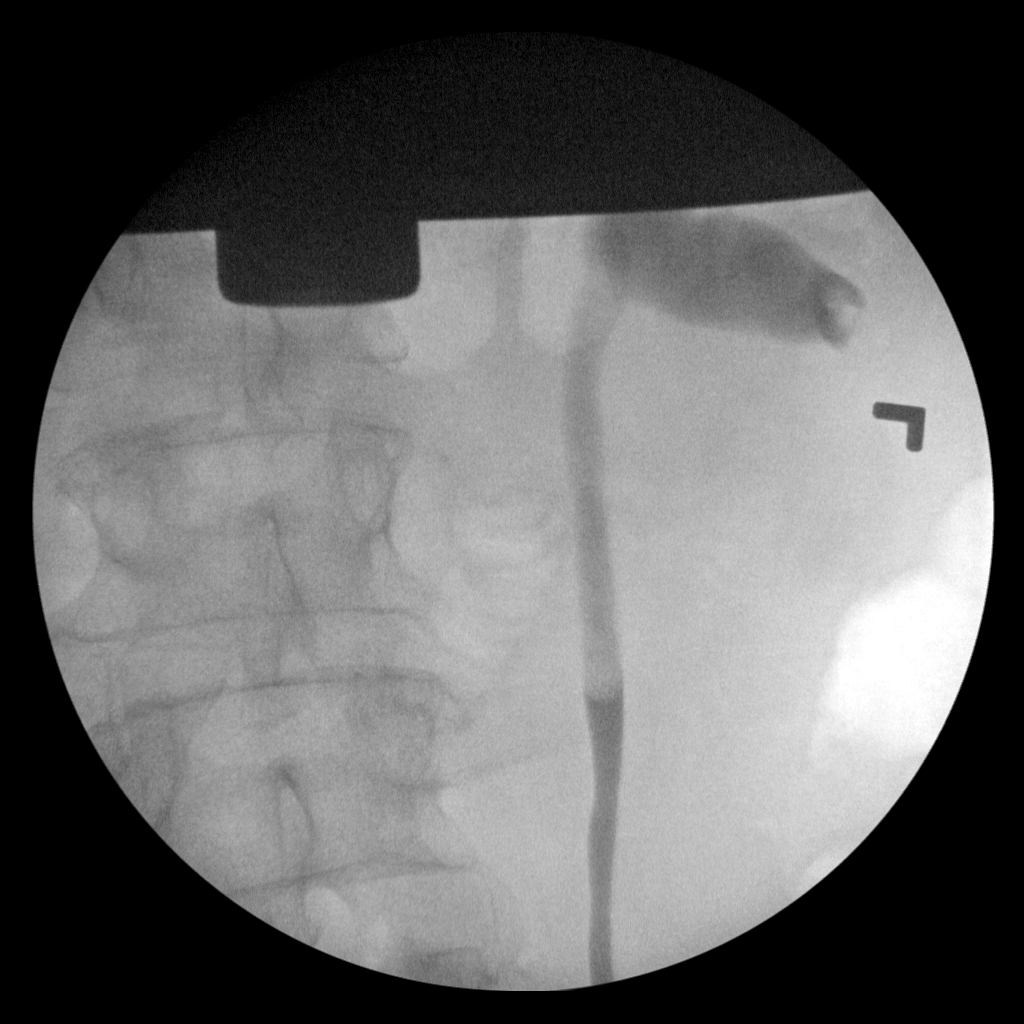

[4 of 4 positions shown; findings below may reference images not displayed]

FINDINGS: Initial image demonstrates a ureteral scope over the anatomic
pelvis. Cannulation of the left ureteral orifice with retrograde
infusion of contrast. Vague filling defects of the distal left
ureter. Incomplete opacification of the left ureter and collecting
system. Rounded filling defect at the mid left ureter, compatible
with findings on prior CT.
IMPRESSION: Limited fluoroscopic images during retrograde pyelogram demonstrates
rounded filling defect in the mid left ureter, compatible with
ureteral stone identified on prior CT.

Please refer to the dictated operative report for full details of
intraoperative findings and procedure.

## 2015-08-23 SURGERY — CYSTOSCOPY, WITH CALCULUS MANIPULATION OR REMOVAL
Anesthesia: General | Laterality: Left

## 2015-08-23 MED ORDER — TAMSULOSIN HCL 0.4 MG PO CAPS: 0.4000 mg | ORAL_CAPSULE | Freq: Every day | ORAL | Status: DC

## 2015-08-23 MED ORDER — LIDOCAINE HCL (PF) 1 % IJ SOLN
INTRAMUSCULAR | Status: AC
  Filled 2015-08-23: qty 5

## 2015-08-23 MED ORDER — OXYCODONE-ACETAMINOPHEN 5-325 MG PO TABS: 1.0000 | ORAL_TABLET | ORAL | Status: DC | PRN

## 2015-08-23 MED ORDER — FENTANYL CITRATE (PF) 100 MCG/2ML IJ SOLN
INTRAMUSCULAR | Status: AC
  Filled 2015-08-23: qty 4

## 2015-08-23 MED ORDER — MIDAZOLAM HCL 5 MG/5ML IJ SOLN
INTRAMUSCULAR | Status: DC | PRN
  Administered 2015-08-23: 2 mg via INTRAVENOUS

## 2015-08-23 MED ORDER — MIDAZOLAM HCL 2 MG/2ML IJ SOLN
1.0000 mg | INTRAMUSCULAR | Status: DC | PRN
  Administered 2015-08-23: 2 mg via INTRAVENOUS

## 2015-08-23 MED ORDER — PROPOFOL 10 MG/ML IV BOLUS
INTRAVENOUS | Status: DC | PRN
  Administered 2015-08-23: 140 mg via INTRAVENOUS

## 2015-08-23 MED ORDER — MIDAZOLAM HCL 2 MG/2ML IJ SOLN
INTRAMUSCULAR | Status: AC
  Filled 2015-08-23: qty 4

## 2015-08-23 MED ORDER — SODIUM CHLORIDE 0.9 % IR SOLN
Status: DC | PRN
  Administered 2015-08-23: 3000 mL

## 2015-08-23 MED ORDER — LIDOCAINE HCL 1 % IJ SOLN
INTRAMUSCULAR | Status: DC | PRN
  Administered 2015-08-23: 30 mg via INTRADERMAL

## 2015-08-23 MED ORDER — CEFAZOLIN SODIUM 1-5 GM-% IV SOLN
1.0000 g | INTRAVENOUS | Status: AC
  Administered 2015-08-23: 1 g via INTRAVENOUS
  Filled 2015-08-23: qty 50

## 2015-08-23 MED ORDER — FENTANYL CITRATE (PF) 100 MCG/2ML IJ SOLN
INTRAMUSCULAR | Status: DC | PRN
  Administered 2015-08-23: 50 ug via INTRAVENOUS

## 2015-08-23 MED ORDER — MIDAZOLAM HCL 2 MG/2ML IJ SOLN
INTRAMUSCULAR | Status: AC
  Filled 2015-08-23: qty 2

## 2015-08-23 MED ORDER — FENTANYL CITRATE (PF) 100 MCG/2ML IJ SOLN: 25.0000 ug | INTRAMUSCULAR | Status: DC | PRN

## 2015-08-23 MED ORDER — LACTATED RINGERS IV SOLN
INTRAVENOUS | Status: DC
  Administered 2015-08-23 (×2): via INTRAVENOUS

## 2015-08-23 MED ORDER — IOHEXOL 350 MG/ML SOLN
INTRAVENOUS | Status: DC | PRN
  Administered 2015-08-23: 50 mL via INTRAVENOUS

## 2015-08-23 MED ORDER — ONDANSETRON HCL 4 MG/2ML IJ SOLN: 4.0000 mg | Freq: Once | INTRAMUSCULAR | Status: DC | PRN

## 2015-08-23 MED ORDER — PROPOFOL 10 MG/ML IV BOLUS
INTRAVENOUS | Status: AC
  Filled 2015-08-23: qty 20

## 2015-08-23 SURGICAL SUPPLY — 30 items
BAG DRAIN URO TABLE W/ADPT NS (DRAPE) ×2 IMPLANT
BAG DRN 8 ADPR NS SKTRN CSTL (DRAPE) ×1
BAG HAMPER (MISCELLANEOUS) ×2 IMPLANT
BAG URO CATCHER STRL LF (DRAPE) ×2 IMPLANT
BASKET LASER NITINOL 1.9FR (BASKET) IMPLANT
BASKET ZERO TIP NITINOL 2.4FR (BASKET) IMPLANT
BSKT STON RTRVL 120 1.9FR (BASKET)
BSKT STON RTRVL ZERO TP 2.4FR (BASKET)
CATH INTERMIT  6FR 70CM (CATHETERS) IMPLANT
CLOTH BEACON ORANGE TIMEOUT ST (SAFETY) ×2 IMPLANT
EXTRACTOR STONE NITINOL NGAGE (UROLOGICAL SUPPLIES) ×3 IMPLANT
FIBER LASER FLEXIVA 365 (UROLOGICAL SUPPLIES) IMPLANT
FLESIVA 200 ×1 IMPLANT
GLOVE BIO SURGEON STRL SZ7.5 (GLOVE) ×2 IMPLANT
GLOVE BIO SURGEON STRL SZ8 (GLOVE) ×2 IMPLANT
GOWN STRL REUS W/ TWL LRG LVL3 (GOWN DISPOSABLE) ×2 IMPLANT
GOWN STRL REUS W/ TWL XL LVL3 (GOWN DISPOSABLE) ×1 IMPLANT
GOWN STRL REUS W/TWL LRG LVL3 (GOWN DISPOSABLE) ×4
GOWN STRL REUS W/TWL XL LVL3 (GOWN DISPOSABLE) ×2
GUIDEWIRE ANG ZIPWIRE 038X150 (WIRE) ×2 IMPLANT
GUIDEWIRE STR DUAL SENSOR (WIRE) IMPLANT
IV NS IRRIG 3000ML ARTHROMATIC (IV SOLUTION) ×2 IMPLANT
KIT ROOM TURNOVER AP CYSTO (KITS) ×2 IMPLANT
LASER FIBER DISP (UROLOGICAL SUPPLIES) IMPLANT
MANIFOLD NEPTUNE II (INSTRUMENTS) IMPLANT
PACK CYSTO (CUSTOM PROCEDURE TRAY) ×2 IMPLANT
PAD ARMBOARD 7.5X6 YLW CONV (MISCELLANEOUS) ×2 IMPLANT
STENT URET 6FRX26 CONTOUR (STENTS) IMPLANT
SYRINGE 10CC LL (SYRINGE) ×2 IMPLANT
TUBE FEEDING 8FR 16IN STR KANG (MISCELLANEOUS) IMPLANT

## 2015-08-23 NOTE — Progress Notes (Signed)
Coke and graham crackers given to eat. Tolerated well.

## 2015-08-23 NOTE — Discharge Instructions (Signed)
Cystoscopy, Care After °Refer to this sheet in the next few weeks. These instructions provide you with information on caring for yourself after your procedure. Your caregiver may also give you more specific instructions. Your treatment has been planned according to current medical practices, but problems sometimes occur. Call your caregiver if you have any problems or questions after your procedure. °HOME CARE INSTRUCTIONS  °Things you can do to ease any discomfort after your procedure include: °· Drinking enough water and fluids to keep your urine clear or pale yellow. °· Taking a warm bath to relieve any burning feelings. °SEEK IMMEDIATE MEDICAL CARE IF:  °· You have an increase in blood in your urine. °· You notice blood clots in your urine. °· You have difficulty passing urine. °· You have the chills. °· You have abdominal pain. °· You have a fever or persistent symptoms for more than 2-3 days. °· You have a fever and your symptoms suddenly get worse. °MAKE SURE YOU:  °· Understand these instructions. °· Will watch your condition. °· Will get help right away if you are not doing well or get worse. °  °This information is not intended to replace advice given to you by your health care provider. Make sure you discuss any questions you have with your health care provider. °  °Document Released: 05/10/2005 Document Revised: 11/11/2014 Document Reviewed: 04/13/2012 °Elsevier Interactive Patient Education ©2016 Elsevier Inc[employer] °General Anesthesia, Adult, Care After °Refer to this sheet in the next few weeks. These instructions provide you with information on caring for yourself after your procedure. Your health care provider may also give you more specific instructions. Your treatment has been planned according to current medical practices, but problems sometimes occur. Call your health care provider if you have any problems or questions after your procedure. °WHAT TO EXPECT AFTER THE PROCEDURE °After the procedure, it  is typical to experience: °· Sleepiness. °· Nausea and vomiting. °HOME CARE INSTRUCTIONS °· For the first 24 hours after general anesthesia: °¨ Have a responsible person with you. °¨ Do not drive a car. If you are alone, do not take public transportation. °¨ Do not drink alcohol. °¨ Do not take medicine that has not been prescribed by your health care provider. °¨ Do not sign important papers or make important decisions. °¨ You may resume a normal diet and activities as directed by your health care provider. °· Change bandages (dressings) as directed. °· If you have questions or problems that seem related to general anesthesia, call the hospital and ask for the anesthetist or anesthesiologist on call. °SEEK MEDICAL CARE IF: °· You have nausea and vomiting that continue the day after anesthesia. °· You develop a rash. °SEEK IMMEDIATE MEDICAL CARE IF:  °· You have difficulty breathing. °· You have chest pain. °· You have any allergic problems. °  °This information is not intended to replace advice given to you by your health care provider. Make sure you discuss any questions you have with your health care provider. °  °Document Released: 01/27/2001 Document Revised: 11/11/2014 Document Reviewed: 02/19/2012 °Elsevier Interactive Patient Education ©2016 Elsevier Inc. ° °

## 2015-08-23 NOTE — Brief Op Note (Signed)
08/23/2015  2:34 PM  PATIENT:  Edward Fisher  49 y.o. male  PRE-OPERATIVE DIAGNOSIS:  left ureteral stone  POST-OPERATIVE DIAGNOSIS:  left ureteral stone  PROCEDURE:  Procedure(s): CYSTOSCOPY WITH URETEROSCOPY, STONE BASKETRY AND STENT PLACEMENT (Left) CYSTOSCOPY WITH RETROGRADE PYELOGRAM (Left) HOLMIUM LASER APPLICATION (Left)  SURGEON:  Surgeon(s) and Role:    * [name], MD - Primary  PHYSICIAN ASSISTANT:   ASSISTANTS: none   ANESTHESIA:   general  EBL:  Total I/O In: 1000 [I.V.:1000] Out: -   BLOOD ADMINISTERED:none  DRAINS: none   LOCAL MEDICATIONS USED:  NONE  SPECIMEN:  Source of Specimen:  stone  DISPOSITION OF SPECIMEN:  PATHOLOGY  COUNTS:  YES  TOURNIQUET:  * No tourniquets in log *  DICTATION: .Note written in EPIC  PLAN OF CARE: Discharge to home after PACU  PATIENT DISPOSITION:  PACU - hemodynamically stable.   Delay start of Pharmacological VTE agent (>24hrs) due to surgical blood loss or risk of bleeding: not applicable

## 2015-08-23 NOTE — Anesthesia Postprocedure Evaluation (Signed)
  Anesthesia Post-op Note  Patient: Edward Fisher  Procedure(s) Performed: Procedure(s): CYSTOSCOPY WITH URETEROSCOPY, STONE BASKET EXTRACTION (Left) CYSTOSCOPY WITH RETROGRADE PYELOGRAM (Left) HOLMIUM LASER APPLICATION (Left)  Patient Location: PACU  Anesthesia Type:General  Level of Consciousness: awake, alert , oriented and patient cooperative  Airway and Oxygen Therapy: Patient Spontanous Breathing  Post-op Pain: 2 /10, mild  Post-op Assessment: Post-op Vital signs reviewed, Patient's Cardiovascular Status Stable, Respiratory Function Stable, Patent Airway, No signs of Nausea or vomiting and Pain level controlled              Post-op Vital Signs: Reviewed and stable  Last Vitals:  Filed Vitals:   08/23/15 1355  BP: 118/73  Resp: 15    Complications: No apparent anesthesia complications

## 2015-08-23 NOTE — Anesthesia Procedure Notes (Signed)
Procedure Name: LMA Insertion Date/Time: 08/23/2015 2:17 PM Performed by: Charmaine Downs Pre-anesthesia Checklist: Patient identified, Emergency Drugs available, Suction available and Patient being monitored Patient Re-evaluated:Patient Re-evaluated prior to inductionOxygen Delivery Method: Circle system utilized Preoxygenation: Pre-oxygenation with 100% oxygen Intubation Type: IV induction Ventilation: Mask ventilation without difficulty LMA: LMA inserted LMA Size: 4.0 Grade View: Grade I Tube type: Oral Number of attempts: 1 Placement Confirmation: breath sounds checked- equal and bilateral and positive ETCO2 Tube secured with: Tape Dental Injury: Teeth and Oropharynx as per pre-operative assessment

## 2015-08-23 NOTE — Transfer of Care (Signed)
Immediate Anesthesia Transfer of Care Note  Patient: Edward Fisher  Procedure(s) Performed: Procedure(s): CYSTOSCOPY WITH URETEROSCOPY, STONE BASKET EXTRACTION (Left) CYSTOSCOPY WITH RETROGRADE PYELOGRAM (Left) HOLMIUM LASER APPLICATION (Left)  Patient Location: PACU  Anesthesia Type:General  Level of Consciousness: awake and patient cooperative  Airway & Oxygen Therapy: Patient Spontanous Breathing and Patient connected to face mask oxygen  Post-op Assessment: Report given to RN, Post -op Vital signs reviewed and stable and Patient moving all extremities  Post vital signs: Reviewed and stable  Last Vitals:  Filed Vitals:   08/23/15 1355  BP: 118/73  Resp: 15    Complications: No apparent anesthesia complications

## 2015-08-23 NOTE — Op Note (Signed)
.  Preoperative diagnosis: Left ureteral stone  Postoperative diagnosis: Same  Procedure: 1 cystoscopy 2. Left retrograde pyelography 3.  Intraoperative fluoroscopy, under one hour, with interpretation 4.  Left ureteroscopic stone manipulation with basket lithotripsy  Attending: Nicolette Bang  Anesthesia: General  Estimated blood loss: None  Drains: none  Specimens: stone  Antibiotics: ancef  Findings: left distal ureteral stone. Severe hydronephrosis. No masses/lesions in the bladder. Ureteral orifices in normal anatomic location.  Indications: Patient is a 49 year old male with a history of left ureteral stone and who has persistent left flank pain.  After discussing treatment options, he decided proceed with left ureteroscopic stone manipulation.  Procedure her in detail: The patient was brought to the operating room and a brief timeout was done to ensure correct patient, correct procedure, correct site.  General anesthesia was administered patient was placed in dorsal lithotomy position.  Her genitalia was then prepped and draped in usual sterile fashion.  A rigid 57 French cystoscope was passed in the urethra and the bladder.  Bladder was inspected free masses or lesions.  the ureteral orifices were in the normal orthotopic locations.  a 6 french ureteral catheter was then instilled into the left ureteral orifice.  a gentle retrograde was obtained and findings noted above.  we then placed a zip wire through the ureteral catheter and advanced up to the renal pelvis.  we then removed the cystoscope and cannulated the left ureteral orifice with a semirigid ureteroscope.  The stone was encountered in the distal ureter. Using an Ngage basket the stone was removed and sent for analysis. Repeat ureteroscopy revealed to residual stone. Since this was an uncomplicated ureteroscopy we elected to not leave a stent.   the bladder was then drained and this concluded the procedure which was well  tolerated by patient.  Complications: None  Condition: Stable, extubated, transferred to PACU  Plan: Patient is to be discharged home as to follow-up in one week.

## 2015-08-23 NOTE — H&P (Signed)
Urology Admission H&P  Chief Complaint: left flank pain  History of Present Illness: [name] is a 49yo with new onset left flank pain. On CT he was found to have a left 49mm proximal ureteral stone. He now has groin pain and urgency and urinary frequency.  Past Medical History  Diagnosis Date  . Hyperlipidemia     Not thought to warrant pharmacologic therapy  . Insomnia   . Arteriosclerotic cardiovascular disease (ASCVD)     Onset in 04/2012; CABG in 06/2012  . Nephrolithiasis    Past Surgical History  Procedure Laterality Date  . Rhinoplasty    . Tonsillectomy  ~1971  . Coronary artery bypass graft  06/24/2012    Procedure: CORONARY ARTERY BYPASS GRAFTING (CABG);  Surgeon: [name], MD;  Location: [facility];  Service: Open Heart Surgery;  Laterality: N/A;  Coronary Artery  Bypass X 4 utilizing endoscopic saphenous vein graft and  Left radial Artery Harvest   . Left heart catheterization with coronary angiogram N/A 06/23/2012    Procedure: LEFT HEART CATHETERIZATION WITH CORONARY ANGIOGRAM;  Surgeon: [name], MD;  Location: [facility] CATH LAB;  Service: Cardiovascular;  Laterality: N/A;    Home Medications:  Prescriptions prior to admission  Medication Sig Dispense Refill Last Dose  . acetaminophen (TYLENOL) 500 MG tablet Take 1,000 mg by mouth every 6 (six) hours as needed for moderate pain.   08/22/2015 at Unknown time  . aspirin 81 MG chewable tablet Chew 81 mg by mouth every evening. TO START DOSAGE IN 6 MONTHS APPROXIMATELY July 2014   08/22/2015 at Unknown time  . Docusate Calcium (STOOL SOFTENER PO) Take 1 capsule by mouth every evening.   08/22/2015 at Unknown time  . ibuprofen (ADVIL,MOTRIN) 800 MG tablet Take 1 tablet (800 mg total) by mouth 3 (three) times daily. 21 tablet 0 08/22/2015 at Unknown time  . nitroGLYCERIN (NITROSTAT) 0.4 MG SL tablet Place 1 tablet (0.4 mg total) under the tongue every 5 (five) minutes as needed for chest pain. 90 tablet 3 08/22/2015 at  Unknown time  . omega-3 acid ethyl esters (LOVAZA) 1 G capsule Take 1 g by mouth every evening.   08/22/2015 at Unknown time  . ondansetron (ZOFRAN) 4 MG tablet Take 1 tablet (4 mg total) by mouth every 6 (six) hours. 12 tablet 0 08/22/2015 at Unknown time  . oxyCODONE-acetaminophen (PERCOCET/ROXICET) 5-325 MG per tablet Take 1 tablet by mouth every 4 (four) hours as needed for severe pain. 15 tablet 0 08/22/2015 at Unknown time  . simvastatin (ZOCOR) 40 MG tablet Take 40 mg by mouth every evening.   08/22/2015 at Unknown time  . tamsulosin (FLOMAX) 0.4 MG CAPS capsule Take 1 capsule (0.4 mg total) by mouth daily. 10 capsule 0 08/22/2015 at Unknown time   Allergies:  Allergies  Allergen Reactions  . Pollen Extract Cough    Family History  Problem Relation Age of Onset  . Ulcers Father     Peptic ulcer disease  . Hypertension Mother   . Coronary artery disease Father     Stents  . Coronary artery disease Brother     CABG   Social History:  reports that he has never smoked. He quit smokeless tobacco use about 16 years ago. His smokeless tobacco use included Chew. He reports that he does not drink alcohol or use illicit drugs.  Review of Systems  Gastrointestinal: Positive for nausea.  Genitourinary: Positive for dysuria, urgency, frequency and flank pain.  All other systems reviewed  and are negative.   Physical Exam:  Vital signs in last 24 hours: Resp:  [15-39] 15 (10/19 1325) BP: (123-135)/(77-81) 129/80 mmHg (10/19 1325) SpO2:  [99 %-100 %] 99 % (10/19 1325) Physical Exam  Constitutional: He is oriented to person, place, and time. He appears well-developed and well-nourished.  HENT:  Head: Normocephalic and atraumatic.  Eyes: EOM are normal. Pupils are equal, round, and reactive to light.  Neck: Normal range of motion. No thyromegaly present.  Cardiovascular: Normal rate and regular rhythm.   Respiratory: Effort normal. No respiratory distress.  GI: Soft. He exhibits no  distension.  Musculoskeletal: Normal range of motion.  Neurological: He is alert and oriented to person, place, and time.  Skin: Skin is warm and dry.  Psychiatric: He has a normal mood and affect. His behavior is normal. Judgment and thought content normal.    Laboratory Data:  No results found for this or any previous visit (from the past 24 hour(s)). No results found for this or any previous visit (from the past 240 hour(s)). Creatinine: No results for input(s): CREATININE in the last 168 hours. Baseline Creatinine: unkown  Impression/Assessment:  49yo with a left ureteral calculus  Plan:  The risks/benefits/alternatives to cysto, left retrograde, left stone extraction was explained to the patient and he understands and wishes to proceed with surgery  Nallely Yost L 08/23/2015, 1:43 PM

## 2015-08-23 NOTE — Anesthesia Preprocedure Evaluation (Addendum)
Anesthesia Evaluation  Patient identified by MRN, date of birth, ID band Patient awake    Reviewed: Allergy & Precautions, NPO status , Patient's Chart, lab work & pertinent test results  History of Anesthesia Complications Negative for: history of anesthetic complications  Airway Mallampati: II  TM Distance: >3 FB     Dental  (+) Teeth Intact, Missing,    Pulmonary neg pulmonary ROS,    Pulmonary exam normal        Cardiovascular hypertension, + CAD and + CABG  Normal cardiovascular exam     Neuro/Psych negative neurological ROS  negative psych ROS   GI/Hepatic negative GI ROS, Neg liver ROS,   Endo/Other  negative endocrine ROS  Renal/GU Renal InsufficiencyRenal disease     Musculoskeletal negative musculoskeletal ROS (+)   Abdominal Normal abdominal exam  (+)   Peds  Hematology negative hematology ROS (+)   Anesthesia Other Findings   Reproductive/Obstetrics                            Anesthesia Physical Anesthesia Plan  ASA: III  Anesthesia Plan: General   Post-op Pain Management:    Induction: Intravenous  Airway Management Planned: LMA  Additional Equipment:   Intra-op Plan:   Post-operative Plan: Extubation in OR  Informed Consent: I have reviewed the patients History and Physical, chart, labs and discussed the procedure including the risks, benefits and alternatives for the proposed anesthesia with the patient or authorized representative who has indicated his/her understanding and acceptance.   Dental advisory given  Plan Discussed with: CRNA  Anesthesia Plan Comments:         Anesthesia Quick Evaluation

## 2015-08-24 ENCOUNTER — Encounter (HOSPITAL_COMMUNITY): Payer: Self-pay | Admitting: Urology

## 2015-08-29 LAB — STONE ANALYSIS
CA PHOS CRY STONE QL IR: 10 %
Ca Oxalate,Dihydrate: 40 %
Ca Oxalate,Monohydr.: 50 %
Stone Weight KSTONE: 44.3 mg

## 2015-08-30 ENCOUNTER — Ambulatory Visit (INDEPENDENT_AMBULATORY_CARE_PROVIDER_SITE_OTHER): Payer: Self-pay | Admitting: Urology

## 2015-08-30 DIAGNOSIS — Z87442 Personal history of urinary calculi: Secondary | ICD-10-CM

## 2015-08-30 DIAGNOSIS — N201 Calculus of ureter: Secondary | ICD-10-CM

## 2016-01-04 ENCOUNTER — Ambulatory Visit: Payer: Self-pay | Admitting: Cardiovascular Disease

## 2016-01-15 ENCOUNTER — Ambulatory Visit: Payer: Self-pay | Admitting: Physician Assistant

## 2016-01-22 ENCOUNTER — Encounter: Payer: Self-pay | Admitting: Cardiovascular Disease

## 2016-01-22 ENCOUNTER — Ambulatory Visit (INDEPENDENT_AMBULATORY_CARE_PROVIDER_SITE_OTHER): Payer: Self-pay | Admitting: Cardiovascular Disease

## 2016-01-22 ENCOUNTER — Other Ambulatory Visit: Payer: Self-pay | Admitting: Urology

## 2016-01-22 VITALS — BP 109/64 | HR 92 | Ht 70.0 in | Wt 190.0 lb

## 2016-01-22 DIAGNOSIS — R002 Palpitations: Secondary | ICD-10-CM

## 2016-01-22 DIAGNOSIS — E785 Hyperlipidemia, unspecified: Secondary | ICD-10-CM

## 2016-01-22 DIAGNOSIS — I25810 Atherosclerosis of coronary artery bypass graft(s) without angina pectoris: Secondary | ICD-10-CM

## 2016-01-22 DIAGNOSIS — N201 Calculus of ureter: Secondary | ICD-10-CM

## 2016-01-22 MED ORDER — LOVASTATIN 40 MG PO TABS
40.0000 mg | ORAL_TABLET | Freq: Every day | ORAL | Status: DC
Start: 2016-01-22 — End: 2016-05-30

## 2016-01-22 MED ORDER — NITROGLYCERIN 0.4 MG SL SUBL: 0.4000 mg | SUBLINGUAL_TABLET | SUBLINGUAL | Status: DC | PRN

## 2016-01-22 NOTE — Patient Instructions (Addendum)
Your physician wants you to follow-up in: 1 year with Dr. Bronson Ing. You will receive a reminder letter in the mail two months in advance. If you don't receive a letter, please call our office to schedule the follow-up appointment.  Your physician has recommended you make the following change in your medication:   Start Lovastatin 40 mg Daily  If you need a refill on your cardiac medications before your next appointment, please call your pharmacy.  Thank you for choosing [facility]! '

## 2016-01-22 NOTE — Progress Notes (Signed)
Patient ID: Edward Fisher, male   DOB: 1966/08/29, 49 y.o.   MRN: [id number]      SUBJECTIVE: The patient presents for annual follow-up for his history of coronary artery disease and CABG.  The patient denies any symptoms of chest pain, shortness of breath, lightheadedness, dizziness, leg swelling, orthopnea, PND, and syncope. Very seldom has palpitations.  Would like to switch from simvastatin to lovastatin as it would be cheaper for him.  Review of Systems: As per "subjective", otherwise negative.  Allergies  Allergen Reactions  . Pollen Extract Cough    Current Outpatient Prescriptions  Medication Sig Dispense Refill  . acetaminophen (TYLENOL) 500 MG tablet Take 1,000 mg by mouth every 6 (six) hours as needed for moderate pain.    [name] aspirin 81 MG chewable tablet Chew 81 mg by mouth every evening. TO START DOSAGE IN 6 MONTHS APPROXIMATELY July 2014    . Docusate Calcium (STOOL SOFTENER PO) Take 1 capsule by mouth every evening.    . nitroGLYCERIN (NITROSTAT) 0.4 MG SL tablet Place 1 tablet (0.4 mg total) under the tongue every 5 (five) minutes as needed for chest pain. 90 tablet 3  . omega-3 acid ethyl esters (LOVAZA) 1 G capsule Take 1 g by mouth every evening.    . simvastatin (ZOCOR) 40 MG tablet Take 40 mg by mouth every evening.     No current facility-administered medications for this visit.    Past Medical History  Diagnosis Date  . Hyperlipidemia     Not thought to warrant pharmacologic therapy  . Insomnia   . Arteriosclerotic cardiovascular disease (ASCVD)     Onset in 04/2012; CABG in 06/2012  . Nephrolithiasis     Past Surgical History  Procedure Laterality Date  . Rhinoplasty    . Tonsillectomy  ~1971  . Coronary artery bypass graft  06/24/2012    Procedure: CORONARY ARTERY BYPASS GRAFTING (CABG);  Surgeon: [name], MD;  Location: [facility];  Service: Open Heart Surgery;  Laterality: N/A;  Coronary Artery  Bypass X 4 utilizing endoscopic saphenous vein  graft and  Left radial Artery Harvest   . Left heart catheterization with coronary angiogram N/A 06/23/2012    Procedure: LEFT HEART CATHETERIZATION WITH CORONARY ANGIOGRAM;  Surgeon: [name], MD;  Location: [facility] CATH LAB;  Service: Cardiovascular;  Laterality: N/A;  . Cystoscopy with ureteroscopy, stone basketry and stent placement Left 08/23/2015    Procedure: CYSTOSCOPY WITH URETEROSCOPY, STONE BASKET EXTRACTION;  Surgeon: [name], MD;  Location: [facility];  Service: Urology;  Laterality: Left;  . Cystoscopy w/ retrogrades Left 08/23/2015    Procedure: CYSTOSCOPY WITH RETROGRADE PYELOGRAM;  Surgeon: [name], MD;  Location: [facility];  Service: Urology;  Laterality: Left;  . Holmium laser application Left [id number]    Procedure: HOLMIUM LASER APPLICATION;  Surgeon: [name], MD;  Location: [facility];  Service: Urology;  Laterality: Left;    Social History   Social History  . Marital Status: Married    Spouse Name: N/A  . Number of Children: N/A  . Years of Education: N/A   Occupational History  . Engineer, agricultural   Social History Main Topics  . Smoking status: Never Smoker   . Smokeless tobacco: Former Systems developer    Types: [place] date: 11/04/1998     Comment: 06/22/2012 "quit chewing 10-15 years ago"  . Alcohol Use: No  . Drug Use: No  . Sexual Activity: Yes  Other Topics Concern  . Not on file   Social History Narrative   Regular exercise: No     Filed Vitals:   01/22/16 1359  BP: 109/64  Pulse: 92  Height: 5\' 10"  (1.778 m)  Weight: 190 lb (86.183 kg)    PHYSICAL EXAM General: NAD HEENT: Normal. Neck: No JVD, no thyromegaly. Lungs: Clear to auscultation bilaterally with normal respiratory effort. CV: Nondisplaced PMI.  Regular rate and rhythm, normal S1/S2, no S3/S4, no murmur. No pretibial or periankle edema.     Abdomen: Soft, nontender, no distention.  Neurologic: Alert and oriented.  Psych: Normal affect. Skin:  Normal. Musculoskeletal: No gross deformities.  ECG: Most recent ECG reviewed.      ASSESSMENT AND PLAN: 1. CAD: Stable ischemic heart disease. Low risk nuclear MPI study in 06/2014. Continue present therapy.  2. Hyperlipidemia: Will obtain copy of lipids from PCP. Would like to switch from simvastatin to lovastatin as it would be cheaper for him. Will switch to 40 mg daily.  3. Palpitations: Seldom in occurrence.  I told him if they were to occur more frequently or last for several minutes at a time, I would then consider cardiac monitoring.  Dispo: f/u 1 year.  Kate Sable, M.D., F.A.C.C.

## 2016-01-30 ENCOUNTER — Other Ambulatory Visit: Payer: Self-pay | Admitting: Physician Assistant

## 2016-01-30 LAB — COMPREHENSIVE METABOLIC PANEL
ALBUMIN: 4.5 g/dL (ref 3.6–5.1)
ALT: 16 U/L (ref 9–46)
AST: 13 U/L (ref 10–35)
Alkaline Phosphatase: 59 U/L (ref 40–115)
BILIRUBIN TOTAL: 0.4 mg/dL (ref 0.2–1.2)
BUN: 16 mg/dL (ref 7–25)
CO2: 25 mmol/L (ref 20–31)
CREATININE: 1 mg/dL (ref 0.70–1.33)
Calcium: 9.2 mg/dL (ref 8.6–10.3)
Chloride: 108 mmol/L (ref 98–110)
Glucose, Bld: 106 mg/dL — ABNORMAL HIGH (ref 65–99)
Potassium: 3.8 mmol/L (ref 3.5–5.3)
SODIUM: 141 mmol/L (ref 135–146)
TOTAL PROTEIN: 6.9 g/dL (ref 6.1–8.1)

## 2016-01-30 LAB — LIPID PANEL
CHOL/HDL RATIO: 3 ratio (ref <=5.0)
CHOLESTEROL: 104 mg/dL — AB (ref 125–200)
HDL: 35 mg/dL — ABNORMAL LOW (ref >=40)
LDL Cholesterol: 52 mg/dL (ref <130)
TRIGLYCERIDES: 83 mg/dL (ref <150)
VLDL: 17 mg/dL (ref <30)

## 2016-02-01 ENCOUNTER — Encounter: Payer: Self-pay | Admitting: Physician Assistant

## 2016-02-01 ENCOUNTER — Ambulatory Visit: Payer: Self-pay | Admitting: Physician Assistant

## 2016-02-01 VITALS — BP 120/74 | HR 84 | Temp 97.9°F | Ht 70.0 in | Wt 191.2 lb

## 2016-02-01 DIAGNOSIS — I2510 Atherosclerotic heart disease of native coronary artery without angina pectoris: Secondary | ICD-10-CM

## 2016-02-01 DIAGNOSIS — E785 Hyperlipidemia, unspecified: Secondary | ICD-10-CM

## 2016-02-01 DIAGNOSIS — Z125 Encounter for screening for malignant neoplasm of prostate: Secondary | ICD-10-CM

## 2016-02-01 DIAGNOSIS — Z1211 Encounter for screening for malignant neoplasm of colon: Secondary | ICD-10-CM

## 2016-02-01 MED ORDER — ATORVASTATIN CALCIUM 20 MG PO TABS: 20.0000 mg | ORAL_TABLET | Freq: Every day | ORAL | Status: DC

## 2016-02-01 NOTE — Progress Notes (Signed)
BP 120/74 mmHg  Pulse 84  Temp(Src) 97.9 F (36.6 C)  Ht 5\' 10"  (1.778 m)  Wt 191 lb 3.2 oz (86.728 kg)  BMI 27.43 kg/m2  SpO2 98%   Subjective:    Patient ID: Edward Fisher, male    DOB: [birth year], 50 y.o.   MRN: [id number]  HPI: Edward Fisher is a 50 y.o. male presenting on 02/01/2016 for No chief complaint on file.   HPI Pt doing well. No complaints Still installing kitchen cabinets as work  Relevant past medical, surgical, family and social history reviewed and updated as indicated. Interim medical history since our last visit reviewed. Allergies and medications reviewed and updated.  Current outpatient prescriptions:  .  acetaminophen (TYLENOL) 500 MG tablet, Take 1,000 mg by mouth every 6 (six) hours as needed for moderate pain., Disp: , Rfl:  .  aspirin 81 MG chewable tablet, Chew 81 mg by mouth every evening. TO START DOSAGE IN 6 MONTHS APPROXIMATELY July 2014, Disp: , Rfl:  .  Docusate Calcium (STOOL SOFTENER PO), Take 1 capsule by mouth every evening., Disp: , Rfl:  .  nitroGLYCERIN (NITROSTAT) 0.4 MG SL tablet, Place 1 tablet (0.4 mg total) under the tongue every 5 (five) minutes as needed for chest pain., Disp: 25 tablet, Rfl: 6 .  omega-3 acid ethyl esters (LOVAZA) 1 G capsule, Take 1 g by mouth every evening., Disp: , Rfl:  .  simvastatin (ZOCOR) 40 MG tablet, Take 40 mg by mouth every evening., Disp: , Rfl:  .  lovastatin (MEVACOR) 40 MG tablet, Take 1 tablet (40 mg total) by mouth at bedtime. (Patient not taking: Reported on 02/01/2016), Disp: 30 tablet, Rfl: 11   Review of Systems  Constitutional: Negative for fever, chills, diaphoresis, appetite change, fatigue and unexpected weight change.  HENT: Negative for congestion, dental problem, drooling, ear pain, facial swelling, hearing loss, mouth sores, sneezing, sore throat, trouble swallowing and voice change.   Eyes: Negative for pain, discharge, redness, itching and visual disturbance.  Respiratory: Negative  for cough, choking, shortness of breath and wheezing.   Cardiovascular: Negative for chest pain, palpitations and leg swelling.  Gastrointestinal: Negative for vomiting, abdominal pain, diarrhea, constipation and blood in stool.  Endocrine: Negative for cold intolerance, heat intolerance and polydipsia.  Genitourinary: Negative for dysuria, hematuria and decreased urine volume.  Musculoskeletal: Negative for back pain, arthralgias and gait problem.  Skin: Negative for rash.  Allergic/Immunologic: Negative for environmental allergies.  Neurological: Negative for seizures, syncope, light-headedness and headaches.  Hematological: Negative for adenopathy.  Psychiatric/Behavioral: Negative for suicidal ideas, dysphoric mood and agitation. The patient is not nervous/anxious.     Per HPI unless specifically indicated above     Objective:    BP 120/74 mmHg  Pulse 84  Temp(Src) 97.9 F (36.6 C)  Ht 5\' 10"  (1.778 m)  Wt 191 lb 3.2 oz (86.728 kg)  BMI 27.43 kg/m2  SpO2 98%  Wt Readings from Last 3 Encounters:  02/01/16 191 lb 3.2 oz (86.728 kg)  01/22/16 190 lb (86.183 kg)  08/22/15 184 lb (83.462 kg)    Physical Exam  Constitutional: He is oriented to person, place, and time. He appears well-developed and well-nourished.  HENT:  Head: Normocephalic and atraumatic.  Neck: Neck supple.  Cardiovascular: Normal rate and regular rhythm.   Pulmonary/Chest: Effort normal and breath sounds normal. He has no wheezes.  Abdominal: Soft. Bowel sounds are normal. There is no hepatosplenomegaly. There is no tenderness.  Musculoskeletal: He exhibits  no edema.  Lymphadenopathy:    He has no cervical adenopathy.  Neurological: He is alert and oriented to person, place, and time.  Skin: Skin is warm and dry.  Psychiatric: He has a normal mood and affect. His behavior is normal.  Vitals reviewed.   Results for orders placed or performed in visit on 01/30/16  Comprehensive metabolic panel  Result  Value Ref Range   Sodium 141 135 - 146 mmol/L   Potassium 3.8 3.5 - 5.3 mmol/L   Chloride 108 98 - 110 mmol/L   CO2 25 20 - 31 mmol/L   Glucose, Bld 106 (H) 65 - 99 mg/dL   BUN 16 7 - 25 mg/dL   Creat 1.00 0.70 - 1.33 mg/dL   Total Bilirubin 0.4 0.2 - 1.2 mg/dL   Alkaline Phosphatase 59 40 - 115 U/L   AST 13 10 - 35 U/L   ALT 16 9 - 46 U/L   Total Protein 6.9 6.1 - 8.1 g/dL   Albumin 4.5 3.6 - 5.1 g/dL   Calcium 9.2 8.6 - 10.3 mg/dL  Lipid panel  Result Value Ref Range   Cholesterol 104 (L) 125 - 200 mg/dL   Triglycerides 83 <150 mg/dL   HDL 35 (L) >=40 mg/dL   Total CHOL/HDL Ratio 3.0 <=5.0 Ratio   VLDL 17 <30 mg/dL   LDL Cholesterol 52 <130 mg/dL      Assessment & Plan:    Encounter Diagnoses  Name Primary?  . Hyperlipidemia Yes  . Arteriosclerotic cardiovascular disease (ASCVD)   . Special screening for malignant neoplasms, colon     -reviewed labs with pt  -Will get pt signed up for [facility] to get atorvastain which will be better for pt with his hx CABG (better than lovatatin) -iFOBT given -F/u 3.5 mo with labs before appt

## 2016-02-06 LAB — IFOBT (OCCULT BLOOD): IFOBT: NEGATIVE

## 2016-02-14 ENCOUNTER — Ambulatory Visit (HOSPITAL_COMMUNITY): Admission: RE | Admit: 2016-02-14 | Payer: Self-pay | Source: Ambulatory Visit

## 2016-02-28 ENCOUNTER — Ambulatory Visit: Payer: Self-pay | Admitting: Urology

## 2016-03-06 ENCOUNTER — Ambulatory Visit (HOSPITAL_COMMUNITY)
Admission: RE | Admit: 2016-03-06 | Discharge: 2016-03-06 | Disposition: A | Discharge status: Home / Self Care | Payer: Self-pay | Source: Ambulatory Visit | Attending: Urology | Admitting: Urology

## 2016-03-06 DIAGNOSIS — N201 Calculus of ureter: Secondary | ICD-10-CM | POA: Insufficient documentation

## 2016-05-21 ENCOUNTER — Other Ambulatory Visit: Payer: Self-pay

## 2016-05-21 DIAGNOSIS — Z125 Encounter for screening for malignant neoplasm of prostate: Secondary | ICD-10-CM

## 2016-05-21 DIAGNOSIS — I2510 Atherosclerotic heart disease of native coronary artery without angina pectoris: Secondary | ICD-10-CM

## 2016-05-21 DIAGNOSIS — E785 Hyperlipidemia, unspecified: Secondary | ICD-10-CM

## 2016-05-27 LAB — COMPLETE METABOLIC PANEL WITH GFR
ALBUMIN: 4.3 g/dL (ref 3.6–5.1)
ALK PHOS: 59 U/L (ref 40–115)
ALT: 25 U/L (ref 9–46)
AST: 17 U/L (ref 10–35)
BILIRUBIN TOTAL: 0.5 mg/dL (ref 0.2–1.2)
BUN: 16 mg/dL (ref 7–25)
CO2: 24 mmol/L (ref 20–31)
Calcium: 9.1 mg/dL (ref 8.6–10.3)
Chloride: 108 mmol/L (ref 98–110)
Creat: 0.98 mg/dL (ref 0.70–1.33)
GFR, EST NON AFRICAN AMERICAN: 88 mL/min (ref >=60)
Glucose, Bld: 105 mg/dL — ABNORMAL HIGH (ref 65–99)
POTASSIUM: 4 mmol/L (ref 3.5–5.3)
SODIUM: 142 mmol/L (ref 135–146)
TOTAL PROTEIN: 6.6 g/dL (ref 6.1–8.1)

## 2016-05-27 LAB — LIPID PANEL
CHOL/HDL RATIO: 4.3 ratio (ref <=5.0)
CHOLESTEROL: 137 mg/dL (ref 125–200)
HDL: 32 mg/dL — AB (ref >=40)
LDL Cholesterol: 80 mg/dL (ref <130)
TRIGLYCERIDES: 124 mg/dL (ref <150)
VLDL: 25 mg/dL (ref <30)

## 2016-05-28 LAB — PSA: PSA: 1.54 ng/mL (ref <=4.00)

## 2016-05-30 ENCOUNTER — Encounter: Payer: Self-pay | Admitting: Physician Assistant

## 2016-05-30 ENCOUNTER — Ambulatory Visit: Payer: Self-pay | Admitting: Physician Assistant

## 2016-05-30 VITALS — BP 138/82 | HR 82 | Temp 98.8°F | Ht 70.0 in | Wt 190.4 lb

## 2016-05-30 DIAGNOSIS — R3989 Other symptoms and signs involving the genitourinary system: Secondary | ICD-10-CM

## 2016-05-30 DIAGNOSIS — I2510 Atherosclerotic heart disease of native coronary artery without angina pectoris: Secondary | ICD-10-CM

## 2016-05-30 DIAGNOSIS — E785 Hyperlipidemia, unspecified: Secondary | ICD-10-CM

## 2016-05-30 NOTE — Progress Notes (Signed)
BP 138/82 (BP Location: Right Arm, Patient Position: Sitting, Cuff Size: Normal)   Pulse 82   Temp 98.8 F (37.1 C)   Ht 5\' 10"  (1.778 m)   Wt 190 lb 6.4 oz (86.4 kg)   SpO2 98%   BMI 27.32 kg/m    Subjective:    Patient ID: Edward Fisher, male    DOB: 01-Jan-1966, 50 y.o.   MRN: [id number]  HPI: Edward Fisher is a 50 y.o. male presenting on 05/30/2016 for Coronary Artery Disease and Hyperlipidemia   HPI   Pt doing well.  He finally brought in his papers to change to lipitor  Pt c/o his urine stream has a portion that aims off to the side and makes him urinate on the toilet.  He says that his stream is good and strong he just wonders why his stream started becoming messy.  He hasn't notice any defect in the end of his penis.  Pt asks if he needs to take stool softeners forever.  He denies recent problems with constipation or hemorrhoids  Relevant past medical, surgical, family and social history reviewed and updated as indicated. Interim medical history since our last visit reviewed. Allergies and medications reviewed and updated.   Current Outpatient Prescriptions:  .  aspirin 81 MG chewable tablet, Chew 81 mg by mouth every evening. TO START DOSAGE IN 6 MONTHS APPROXIMATELY July 2014, Disp: , Rfl:  .  Docusate Calcium (STOOL SOFTENER PO), Take 1 capsule by mouth every evening., Disp: , Rfl:  .  omega-3 acid ethyl esters (LOVAZA) 1 G capsule, Take 1 g by mouth every evening., Disp: , Rfl:  .  simvastatin (ZOCOR) 40 MG tablet, Take 40 mg by mouth every evening., Disp: , Rfl:  .  atorvastatin (LIPITOR) 20 MG tablet, Take 1 tablet (20 mg total) by mouth daily. (Patient not taking: Reported on 05/30/2016), Disp: 90 tablet, Rfl: 2 .  nitroGLYCERIN (NITROSTAT) 0.4 MG SL tablet, Place 1 tablet (0.4 mg total) under the tongue every 5 (five) minutes as needed for chest pain. (Patient not taking: Reported on 05/30/2016), Disp: 25 tablet, Rfl: 6   Review of Systems  Constitutional:  Negative for appetite change, chills, diaphoresis, fatigue, fever and unexpected weight change.  HENT: Negative for congestion, dental problem, drooling, ear pain, facial swelling, hearing loss, mouth sores, sneezing, sore throat, trouble swallowing and voice change.   Eyes: Negative for pain, discharge, redness, itching and visual disturbance.  Respiratory: Negative for cough, choking, shortness of breath and wheezing.   Cardiovascular: Negative for chest pain, palpitations and leg swelling.  Gastrointestinal: Negative for abdominal pain, blood in stool, constipation, diarrhea and vomiting.  Endocrine: Negative for cold intolerance, heat intolerance and polydipsia.  Genitourinary: Negative for decreased urine volume, dysuria and hematuria.  Musculoskeletal: Negative for arthralgias, back pain and gait problem.  Skin: Negative for rash.  Allergic/Immunologic: Negative for environmental allergies.  Neurological: Negative for seizures, syncope, light-headedness and headaches.  Hematological: Negative for adenopathy.  Psychiatric/Behavioral: Negative for agitation, dysphoric mood and suicidal ideas. The patient is not nervous/anxious.     Per HPI unless specifically indicated above     Objective:    BP 138/82 (BP Location: Right Arm, Patient Position: Sitting, Cuff Size: Normal)   Pulse 82   Temp 98.8 F (37.1 C)   Ht 5\' 10"  (1.778 m)   Wt 190 lb 6.4 oz (86.4 kg)   SpO2 98%   BMI 27.32 kg/m   Wt Readings from Last 3  Encounters:  05/30/16 190 lb 6.4 oz (86.4 kg)  02/01/16 191 lb 3.2 oz (86.7 kg)  01/22/16 190 lb (86.2 kg)    Physical Exam  Constitutional: He is oriented to person, place, and time. He appears well-developed and well-nourished.  HENT:  Head: Normocephalic and atraumatic.  Neck: Neck supple.  Cardiovascular: Normal rate and regular rhythm.   Pulmonary/Chest: Effort normal and breath sounds normal. He has no wheezes.  Abdominal: Soft. Bowel sounds are normal. There  is no hepatosplenomegaly. There is no tenderness.  Genitourinary: Penis normal. Circumcised. No phimosis, paraphimosis, hypospadias, penile erythema or penile tenderness. No discharge found.  Musculoskeletal: He exhibits no edema.  Lymphadenopathy:    He has no cervical adenopathy.  Neurological: He is alert and oriented to person, place, and time.  Skin: Skin is warm and dry.  Psychiatric: He has a normal mood and affect. His behavior is normal.  Vitals reviewed.   Results for orders placed or performed in visit on 05/21/16  COMPLETE METABOLIC PANEL WITH GFR  Result Value Ref Range   Sodium 142 135 - 146 mmol/L   Potassium 4.0 3.5 - 5.3 mmol/L   Chloride 108 98 - 110 mmol/L   CO2 24 20 - 31 mmol/L   Glucose, Bld 105 (H) 65 - 99 mg/dL   BUN 16 7 - 25 mg/dL   Creat 0.98 0.70 - 1.33 mg/dL   Total Bilirubin 0.5 0.2 - 1.2 mg/dL   Alkaline Phosphatase 59 40 - 115 U/L   AST 17 10 - 35 U/L   ALT 25 9 - 46 U/L   Total Protein 6.6 6.1 - 8.1 g/dL   Albumin 4.3 3.6 - 5.1 g/dL   Calcium 9.1 8.6 - 10.3 mg/dL   GFR, Est African American >89 >=60 mL/min   GFR, Est Non African American 88 >=60 mL/min  PSA  Result Value Ref Range   PSA 1.54 <=4.00 ng/mL  Lipid Profile  Result Value Ref Range   Cholesterol 137 125 - 200 mg/dL   Triglycerides 124 <150 mg/dL   HDL 32 (L) >=40 mg/dL   Total CHOL/HDL Ratio 4.3 <=5.0 Ratio   VLDL 25 <30 mg/dL   LDL Cholesterol 80 <130 mg/dL      Assessment & Plan:   Encounter Diagnoses  Name Primary?  . Hyperlipidemia Yes  . Arteriosclerotic cardiovascular disease (ASCVD)   . Urine troubles     -reviewed labs with pt -Change to lipitor for cost (through [employer]) -continue other medications -counseled pt on urinary symptoms.  RTO for worsening or new symptoms -counseled pt on increasing the fiber in his diet and gradually weaning off of the stool softeners.  Encouraged to drink plenty of water and then can use the stool softeners on prn basis -f/u  3 months.  RTO sooner prn

## 2016-05-30 NOTE — Patient Instructions (Signed)
High-Fiber Diet  Fiber, also called dietary fiber, is a type of carbohydrate found in fruits, vegetables, whole grains, and beans. A high-fiber diet can have many health benefits. Your health care provider may recommend a high-fiber diet to help:  · Prevent constipation. Fiber can make your bowel movements more regular.  · Lower your cholesterol.  · Relieve hemorrhoids, uncomplicated diverticulosis, or irritable bowel syndrome.  · Prevent overeating as part of a weight-loss plan.  · Prevent heart disease, type 2 diabetes, and certain cancers.  WHAT IS MY PLAN?  The recommended daily intake of fiber includes:  · 38 grams for men under age 50.  · 30 grams for men over age 50.  · 25 grams for women under age 50.  · 21 grams for women over age 50.  You can get the recommended daily intake of dietary fiber by eating a variety of fruits, vegetables, grains, and beans. Your health care provider may also recommend a fiber supplement if it is not possible to get enough fiber through your diet.  WHAT DO I NEED TO KNOW ABOUT A HIGH-FIBER DIET?  · Fiber supplements have not been widely studied for their effectiveness, so it is better to get fiber through food sources.  · Always check the fiber content on the nutrition facts label of any prepackaged food. Look for foods that contain at least 5 grams of fiber per serving.  · Ask your dietitian if you have questions about specific foods that are related to your condition, especially if those foods are not listed in the following section.  · Increase your daily fiber consumption gradually. Increasing your intake of dietary fiber too quickly may cause bloating, cramping, or gas.  · Drink plenty of water. Water helps you to digest fiber.  WHAT FOODS CAN I EAT?  Grains  Whole-grain breads. Multigrain cereal. Oats and oatmeal. Brown rice. Barley. Bulgur wheat. Millet. Bran muffins. Popcorn. Rye wafer crackers.  Vegetables  Sweet potatoes. Spinach. Kale. Artichokes. Cabbage. Broccoli.  Green peas. Carrots. Squash.  Fruits  Berries. Pears. Apples. Oranges. Avocados. Prunes and raisins. Dried figs.  Meats and Other Protein Sources  Navy, kidney, pinto, and soy beans. Split peas. Lentils. Nuts and seeds.  Dairy  Fiber-fortified yogurt.  Beverages  Fiber-fortified soy milk. Fiber-fortified orange juice.  Other  Fiber bars.  The items listed above may not be a complete list of recommended foods or beverages. Contact your dietitian for more options.  WHAT FOODS ARE NOT RECOMMENDED?  Grains  White bread. Pasta made with refined flour. White rice.  Vegetables  Fried potatoes. Canned vegetables. Well-cooked vegetables.   Fruits  Fruit juice. Cooked, strained fruit.  Meats and Other Protein Sources  Fatty cuts of meat. Fried poultry or fried fish.  Dairy  Milk. Yogurt. Cream cheese. Sour cream.  Beverages  Soft drinks.  Other  Cakes and pastries. Butter and oils.  The items listed above may not be a complete list of foods and beverages to avoid. Contact your dietitian for more information.  WHAT ARE SOME TIPS FOR INCLUDING HIGH-FIBER FOODS IN MY DIET?  · Eat a wide variety of high-fiber foods.  · Make sure that half of all grains consumed each day are whole grains.  · Replace breads and cereals made from refined flour or white flour with whole-grain breads and cereals.  · Replace white rice with brown rice, bulgur wheat, or millet.  · Start the day with a breakfast that is high in fiber, such as a   cereal that contains at least 5 grams of fiber per serving.  · Use beans in place of meat in soups, salads, or pasta.  · Eat high-fiber snacks, such as berries, raw vegetables, nuts, or popcorn.     This information is not intended to replace advice given to you by your health care provider. Make sure you discuss any questions you have with your health care provider.     Document Released: 10/21/2005 Document Revised: 11/11/2014 Document Reviewed: 04/05/2014  [employer]vier Interactive Patient Education ©2016 [employer]

## 2016-06-05 ENCOUNTER — Telehealth: Payer: Self-pay | Admitting: Internal Medicine

## 2016-06-05 NOTE — Telephone Encounter (Signed)
RECALL FOR TCS °

## 2016-06-05 NOTE — Telephone Encounter (Signed)
Letter mailed to pt.  

## 2016-08-05 ENCOUNTER — Ambulatory Visit: Payer: Self-pay

## 2016-08-20 ENCOUNTER — Other Ambulatory Visit: Payer: Self-pay

## 2016-08-20 DIAGNOSIS — E785 Hyperlipidemia, unspecified: Secondary | ICD-10-CM

## 2016-08-20 DIAGNOSIS — I2510 Atherosclerotic heart disease of native coronary artery without angina pectoris: Secondary | ICD-10-CM

## 2016-08-24 LAB — LIPID PANEL
CHOLESTEROL: 121 mg/dL — AB (ref 125–200)
HDL: 30 mg/dL — ABNORMAL LOW (ref >=40)
LDL Cholesterol: 75 mg/dL (ref <130)
Total CHOL/HDL Ratio: 4 Ratio (ref <=5.0)
Triglycerides: 81 mg/dL (ref <150)
VLDL: 16 mg/dL (ref <30)

## 2016-08-24 LAB — COMPLETE METABOLIC PANEL WITH GFR
ALBUMIN: 4.6 g/dL (ref 3.6–5.1)
ALK PHOS: 62 U/L (ref 40–115)
ALT: 18 U/L (ref 9–46)
AST: 17 U/L (ref 10–35)
BUN: 16 mg/dL (ref 7–25)
CALCIUM: 9.4 mg/dL (ref 8.6–10.3)
CO2: 24 mmol/L (ref 20–31)
CREATININE: 0.98 mg/dL (ref 0.70–1.33)
Chloride: 105 mmol/L (ref 98–110)
GFR, Est African American: >89 mL/min (ref >=60)
GFR, Est Non African American: 88 mL/min (ref >=60)
GLUCOSE: 108 mg/dL — AB (ref 65–99)
Potassium: 3.8 mmol/L (ref 3.5–5.3)
Sodium: 141 mmol/L (ref 135–146)
Total Bilirubin: 0.6 mg/dL (ref 0.2–1.2)
Total Protein: 7.1 g/dL (ref 6.1–8.1)

## 2016-08-28 ENCOUNTER — Encounter: Payer: Self-pay | Admitting: Physician Assistant

## 2016-08-28 ENCOUNTER — Ambulatory Visit: Payer: Self-pay | Admitting: Physician Assistant

## 2016-08-28 VITALS — BP 126/84 | HR 78 | Temp 98.2°F | Wt 194.6 lb

## 2016-08-28 DIAGNOSIS — I2510 Atherosclerotic heart disease of native coronary artery without angina pectoris: Secondary | ICD-10-CM

## 2016-08-28 DIAGNOSIS — I10 Essential (primary) hypertension: Secondary | ICD-10-CM

## 2016-08-28 DIAGNOSIS — E785 Hyperlipidemia, unspecified: Secondary | ICD-10-CM

## 2016-08-28 NOTE — Progress Notes (Signed)
BP 126/84 (BP Location: Left Arm, Patient Position: Sitting, Cuff Size: Normal)   Pulse 78   Temp 98.2 F (36.8 C)   Wt 194 lb 9.6 oz (88.3 kg)   SpO2 99%   BMI 27.92 kg/m    Subjective:    Patient ID: Edward Fisher, male    DOB: 08-26-1966, 50 y.o.   MRN: [id number]  HPI: Edward Fisher is a 50 y.o. male presenting on 08/28/2016 for Hyperlipidemia   HPI   Pt is doing well.  Denies CP, sob  EPIC review shows that GI sent pt letter in August.  He says he never got a letter.  Relevant past medical, surgical, family and social history reviewed and updated as indicated. Interim medical history since our last visit reviewed. Allergies and medications reviewed and updated.   Current Outpatient Prescriptions:  .  aspirin 81 MG chewable tablet, Chew 81 mg by mouth every evening. TO START DOSAGE IN 6 MONTHS APPROXIMATELY July 2014, Disp: , Rfl:  .  atorvastatin (LIPITOR) 20 MG tablet, Take 1 tablet (20 mg total) by mouth daily., Disp: 90 tablet, Rfl: 2 .  nitroGLYCERIN (NITROSTAT) 0.4 MG SL tablet, Place 1 tablet (0.4 mg total) under the tongue every 5 (five) minutes as needed for chest pain., Disp: 25 tablet, Rfl: 6 .  omega-3 acid ethyl esters (LOVAZA) 1 G capsule, Take 1 g by mouth every evening., Disp: , Rfl:    Review of Systems  Constitutional: Negative for appetite change, chills, diaphoresis, fatigue, fever and unexpected weight change.  HENT: Negative for congestion, dental problem, drooling, ear pain, facial swelling, hearing loss, mouth sores, sneezing, sore throat, trouble swallowing and voice change.   Eyes: Negative for pain, discharge, redness, itching and visual disturbance.  Respiratory: Negative for cough, choking, shortness of breath and wheezing.   Cardiovascular: Negative for chest pain, palpitations and leg swelling.  Gastrointestinal: Negative for abdominal pain, blood in stool, constipation, diarrhea and vomiting.  Endocrine: Negative for cold intolerance,  heat intolerance and polydipsia.  Genitourinary: Negative for decreased urine volume, dysuria and hematuria.  Musculoskeletal: Negative for arthralgias, back pain and gait problem.  Skin: Negative for rash.  Allergic/Immunologic: Negative for environmental allergies.  Neurological: Negative for seizures, syncope, light-headedness and headaches.  Hematological: Negative for adenopathy.  Psychiatric/Behavioral: Negative for agitation, dysphoric mood and suicidal ideas. The patient is not nervous/anxious.     Per HPI unless specifically indicated above     Objective:    BP 126/84 (BP Location: Left Arm, Patient Position: Sitting, Cuff Size: Normal)   Pulse 78   Temp 98.2 F (36.8 C)   Wt 194 lb 9.6 oz (88.3 kg)   SpO2 99%   BMI 27.92 kg/m   Wt Readings from Last 3 Encounters:  08/28/16 194 lb 9.6 oz (88.3 kg)  05/30/16 190 lb 6.4 oz (86.4 kg)  02/01/16 191 lb 3.2 oz (86.7 kg)    Physical Exam  Constitutional: He is oriented to person, place, and time. He appears well-developed and well-nourished.  HENT:  Head: Normocephalic and atraumatic.  Neck: Neck supple.  Cardiovascular: Normal rate and regular rhythm.   Pulmonary/Chest: Effort normal and breath sounds normal. He has no wheezes.  Abdominal: Soft. Bowel sounds are normal. There is no hepatosplenomegaly. There is no tenderness.  Musculoskeletal: He exhibits no edema.  Lymphadenopathy:    He has no cervical adenopathy.  Neurological: He is alert and oriented to person, place, and time.  Skin: Skin is warm and dry.  Psychiatric: He has a normal mood and affect. His behavior is normal.  Vitals reviewed.   Results for orders placed or performed in visit on 08/20/16  COMPLETE METABOLIC PANEL WITH GFR  Result Value Ref Range   Sodium 141 135 - 146 mmol/L   Potassium 3.8 3.5 - 5.3 mmol/L   Chloride 105 98 - 110 mmol/L   CO2 24 20 - 31 mmol/L   Glucose, Bld 108 (H) 65 - 99 mg/dL   BUN 16 7 - 25 mg/dL   Creat 0.98 0.70 -  1.33 mg/dL   Total Bilirubin 0.6 0.2 - 1.2 mg/dL   Alkaline Phosphatase 62 40 - 115 U/L   AST 17 10 - 35 U/L   ALT 18 9 - 46 U/L   Total Protein 7.1 6.1 - 8.1 g/dL   Albumin 4.6 3.6 - 5.1 g/dL   Calcium 9.4 8.6 - 10.3 mg/dL   GFR, Est African American >89 >=60 mL/min   GFR, Est Non African American 88 >=60 mL/min  Lipid Profile  Result Value Ref Range   Cholesterol 121 (L) 125 - 200 mg/dL   Triglycerides 81 <150 mg/dL   HDL 30 (L) >=40 mg/dL   Total CHOL/HDL Ratio 4.0 <=5.0 Ratio   VLDL 16 <30 mg/dL   LDL Cholesterol 75 <130 mg/dL      Assessment & Plan:   Encounter Diagnoses  Name Primary?  [name] Arteriosclerotic cardiovascular disease (ASCVD) Yes  . Essential hypertension   . Hyperlipidemia, unspecified hyperlipidemia type      -reviewed labs with pt -continue current medications -letter from GI reprinted and given to pt for him to contact re colonoscopy.  Pt is given [facility] discount application -follow up 3 months.  RTO sooner prn

## 2016-09-30 ENCOUNTER — Ambulatory Visit: Payer: Self-pay | Admitting: Physician Assistant

## 2016-09-30 ENCOUNTER — Encounter: Payer: Self-pay | Admitting: Physician Assistant

## 2016-09-30 VITALS — BP 126/80 | HR 78 | Temp 98.1°F | Ht 70.0 in | Wt 191.4 lb

## 2016-09-30 DIAGNOSIS — S39012A Strain of muscle, fascia and tendon of lower back, initial encounter: Secondary | ICD-10-CM

## 2016-09-30 MED ORDER — PREDNISONE 10 MG PO TABS: ORAL_TABLET | ORAL | 0 refills | Status: AC

## 2016-09-30 NOTE — Progress Notes (Signed)
BP 126/80 (BP Location: Left Arm, Patient Position: Sitting, Cuff Size: Normal)   Pulse 78   Temp 98.1 F (36.7 C)   Ht 5\' 10"  (1.778 m)   Wt 191 lb 6.4 oz (86.8 kg)   SpO2 97%   BMI 27.46 kg/m    Subjective:    Patient ID: Edward Fisher, male    DOB: September 20, 1966, 50 y.o.   MRN: [id number]  HPI: Edward Fisher is a 50 y.o. male presenting on 09/30/2016 for Back Pain (off and on for a couple months. past week has gotten worse. 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   He tried some exercises yesterday but prior to that just some streches.  He took some tylenol a few times and he had a few pills leftover from when he had a kidney stone.    He thinks this all started several months ago when carrying cabinets up the fire escape and felt a pull when turning.   Pain mostly R "buttcheek and calf"  Relevant past medical, surgical, family and social history reviewed and updated as indicated. Interim medical history since our last visit reviewed. Allergies and medications reviewed and updated.  CURRENT MEDS: Fish oil Nitrostat lipitor aspirin  Review of Systems  Constitutional: Negative for appetite change, chills, diaphoresis, fatigue, fever and unexpected weight change.  HENT: Negative for congestion, dental problem, drooling, ear pain, facial swelling, hearing loss, mouth sores, sneezing, sore throat, trouble swallowing and voice change.   Eyes: Negative for pain, discharge, redness, itching and visual disturbance.  Respiratory: Negative for cough, choking, shortness of breath and wheezing.   Cardiovascular: Negative for chest pain, palpitations and leg swelling.  Gastrointestinal: Negative for abdominal pain, blood in stool, constipation, diarrhea and vomiting.  Endocrine: Negative for cold intolerance, heat intolerance and polydipsia.  Genitourinary: Negative for decreased urine volume, dysuria and  hematuria.  Musculoskeletal: Positive for back pain. Negative for arthralgias and gait problem.  Skin: Negative for rash.  Allergic/Immunologic: Negative for environmental allergies.  Neurological: Negative for seizures, syncope, light-headedness and headaches.  Hematological: Negative for adenopathy.  Psychiatric/Behavioral: Negative for agitation, dysphoric mood and suicidal ideas. The patient is not nervous/anxious.     Per HPI unless specifically indicated above     Objective:    BP 126/80 (BP Location: Left Arm, Patient Position: Sitting, Cuff Size: Normal)   Pulse 78   Temp 98.1 F (36.7 C)   Ht 5\' 10"  (1.778 m)   Wt 191 lb 6.4 oz (86.8 kg)   SpO2 97%   BMI 27.46 kg/m   Wt Readings from Last 3 Encounters:  09/30/16 191 lb 6.4 oz (86.8 kg)  08/28/16 194 lb 9.6 oz (88.3 kg)  05/30/16 190 lb 6.4 oz (86.4 kg)    Physical Exam  Constitutional: He is oriented to person, place, and time. He appears well-developed and well-nourished.  HENT:  Head: Normocephalic and atraumatic.  Neck: Neck supple.  Cardiovascular: Normal rate and regular rhythm.   Pulmonary/Chest: Effort normal and breath sounds normal. He has no wheezes.  Abdominal: Soft. Bowel sounds are normal. There is no hepatosplenomegaly. There is no tenderness.  Musculoskeletal: He exhibits no edema.       Right hip: Normal.       Right knee: Normal.       Thoracic back: Normal.       Lumbar back: Normal.  Right lower leg: Normal.  Lymphadenopathy:    He has no cervical adenopathy.  Neurological: He is alert and oriented to person, place, and time. He has normal strength. Coordination normal.  Reflex Scores:      Patellar reflexes are 2+ on the right side and 2+ on the left side. Skin: Skin is warm and dry.  Psychiatric: He has a normal mood and affect. His behavior is normal.  Vitals reviewed.       Assessment & Plan:    Encounter Diagnosis  Name Primary?  . 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  RTO sooner prn

## 2016-09-30 NOTE — Patient Instructions (Signed)
Low Back Strain A strain is a stretch or tear in a muscle or the strong cords of tissue that attach muscle to bone (tendons). Strains of the lower back (lumbar spine) are a common cause of low back pain. A strain occurs when muscles or tendons are torn or are stretched beyond their limits. The muscles may become inflamed, resulting in pain and sudden muscle tightening (spasms). A strain can happen suddenly due to an injury (trauma), or it can develop gradually due to overuse. There are three types of strains:  Grade 1 is a mild strain involving a minor tear of the muscle fibers or tendons. This may cause some pain but no loss of muscle strength.  Grade 2 is a moderate strain involving a partial tear of the muscle fibers or tendons. This causes more severe pain and some loss of muscle strength.  Grade 3 is a severe strain involving a complete tear of the muscle or tendon. This causes severe pain and complete or nearly complete loss of muscle strength. What are the causes? This condition may be caused by:  Trauma, such as a fall or a hit to the body.  Twisting or overstretching the back. This may result from doing activities that require a lot of energy, such as lifting heavy objects. What increases the risk? The following factors may increase your risk of getting this condition:  Playing contact sports.  Participating in sports or activities that put excessive stress on the back and require a lot of bending and twisting, including:  Lifting weights or heavy objects.  Gymnastics.  Soccer.  Figure skating.  Snowboarding.  Being overweight or obese.  Having poor strength and flexibility. What are the signs or symptoms? Symptoms of this condition may include:  Sharp or dull pain in the lower back that does not go away. Pain may extend to the buttocks.  Stiffness.  Limited range of motion.  Inability to stand up straight due to stiffness or pain.  Muscle spasms. How is this  diagnosed?   This condition may be diagnosed based on:  Your symptoms.  Your medical history.  A physical exam.  Your health care provider may push on certain areas of your back to determine the source of your pain.  You may be asked to bend forward, backward, and side to side to assess the severity of your pain and your range of motion.  Imaging tests, such as:  X-rays.  MRI. How is this treated? Treatment for this condition may include:  Applying heat and cold to the affected area.  Medicines to help relieve pain and to relax your muscles (muscle relaxants).  NSAIDs to help reduce swelling and discomfort.  Physical therapy. When your symptoms improve, it is important to gradually return to your normal routine as soon as possible to reduce pain, avoid stiffness, and avoid loss of muscle strength. Generally, symptoms should improve within 6 weeks of treatment. However, recovery time varies. Follow these instructions at home: Managing pain, stiffness, and swelling  If directed, apply ice to the injured area during the first 24 hours after your injury.  Put ice in a plastic bag.  Place a towel between your skin and the bag.  Leave the ice on for 20 minutes, 2-3 times a day.  If directed, apply heat to the affected area as often as told by your health care provider. Use the heat source that your health care provider recommends, such as a moist heat pack or a heating pad.    Place a towel between your skin and the heat source.  Leave the heat on for 20-30 minutes.  Remove the heat if your skin turns bright red. This is especially important if you are unable to feel pain, heat, or cold. You may have a greater risk of getting burned. Activity  Rest and return to your normal activities as told by your health care provider. Ask your health care provider what activities are safe for you.  Avoid activities that take a lot of effort (are strenuous) for as long as told by your  health care provider.  Do exercises as told by your health care provider. General instructions  Take over-the-counter and prescription medicines only as told by your health care provider.  If you have questions or concerns about safety while taking pain medicine, talk with your health care provider.  Do not drive or operate heavy machinery until you know how your pain medicine affects you.  Do not use any tobacco products, such as cigarettes, chewing tobacco, and e-cigarettes. Tobacco can delay bone healing. If you need help quitting, ask your health care provider.  Keep all follow-up visits as told by your health care provider. This is important. How is this prevented?  Warm up and stretch before being active.  Cool down and stretch after being active.  Give your body time to rest between periods of activity.  Avoid:  Being physically inactive for long periods at a time.  Exercising or playing sports when you are tired or in pain.  Use correct form when playing sports and lifting heavy objects.  Use good posture when sitting and standing.  Maintain a healthy weight.  Sleep on a mattress with medium firmness to support your back.  Make sure to use equipment that fits you, including shoes that fit well.  Be safe and responsible while being active to avoid falls.  Do at least 150 minutes of moderate-intensity exercise each week, such as brisk walking or water aerobics. Try a form of exercise that takes stress off your back, such as swimming or stationary cycling.  Maintain physical fitness, including:  Strength.  Flexibility.  Cardiovascular fitness.  Endurance. Contact a health care provider if:  Your back pain does not improve after 6 weeks of treatment.  Your symptoms get worse. Get help right away if:  Your back pain is severe.  You are unable to stand or walk.  You develop pain in your legs.  You develop weakness in your buttocks or legs.  You have  difficulty controlling when you urinate or when you have a bowel movement. This information is not intended to replace advice given to you by your health care provider. Make sure you discuss any questions you have with your health care provider. Document Released: 10/21/2005 Document Revised: 06/27/2016 Document Reviewed: 08/02/2015 Elsevier Interactive Patient Education  2017 [employer]  

## 2016-11-19 ENCOUNTER — Other Ambulatory Visit: Payer: Self-pay

## 2016-11-19 DIAGNOSIS — I10 Essential (primary) hypertension: Secondary | ICD-10-CM

## 2016-11-19 DIAGNOSIS — I2510 Atherosclerotic heart disease of native coronary artery without angina pectoris: Secondary | ICD-10-CM

## 2016-11-19 DIAGNOSIS — E785 Hyperlipidemia, unspecified: Secondary | ICD-10-CM

## 2016-11-23 LAB — COMPREHENSIVE METABOLIC PANEL
ALBUMIN: 4.4 g/dL (ref 3.6–5.1)
ALT: 21 U/L (ref 9–46)
AST: 16 U/L (ref 10–35)
Alkaline Phosphatase: 63 U/L (ref 40–115)
BILIRUBIN TOTAL: 0.6 mg/dL (ref 0.2–1.2)
BUN: 13 mg/dL (ref 7–25)
CO2: 28 mmol/L (ref 20–31)
CREATININE: 1 mg/dL (ref 0.70–1.33)
Calcium: 9.5 mg/dL (ref 8.6–10.3)
Chloride: 107 mmol/L (ref 98–110)
GLUCOSE: 101 mg/dL — AB (ref 65–99)
Potassium: 4.7 mmol/L (ref 3.5–5.3)
SODIUM: 142 mmol/L (ref 135–146)
Total Protein: 7 g/dL (ref 6.1–8.1)

## 2016-11-23 LAB — LIPID PANEL
CHOL/HDL RATIO: 3.3 ratio (ref <5.0)
Cholesterol: 105 mg/dL (ref <200)
HDL: 32 mg/dL — ABNORMAL LOW (ref >40)
LDL Cholesterol: 52 mg/dL (ref <100)
Triglycerides: 104 mg/dL (ref <150)
VLDL: 21 mg/dL (ref <30)

## 2016-11-26 ENCOUNTER — Encounter: Payer: Self-pay | Admitting: Physician Assistant

## 2016-11-26 ENCOUNTER — Ambulatory Visit: Payer: Self-pay | Admitting: Physician Assistant

## 2016-11-26 VITALS — BP 126/78 | HR 88 | Temp 98.1°F | Wt 195.8 lb

## 2016-11-26 DIAGNOSIS — I10 Essential (primary) hypertension: Secondary | ICD-10-CM

## 2016-11-26 DIAGNOSIS — I2510 Atherosclerotic heart disease of native coronary artery without angina pectoris: Secondary | ICD-10-CM

## 2016-11-26 DIAGNOSIS — E785 Hyperlipidemia, unspecified: Secondary | ICD-10-CM

## 2016-11-26 NOTE — Progress Notes (Signed)
BP 126/78 (BP Location: Left Arm, Patient Position: Sitting, Cuff Size: Normal)   Pulse 88   Temp 98.1 F (36.7 C)   Wt 195 lb 12 oz (88.8 kg)   SpO2 98%   BMI 28.09 kg/m    Subjective:    Patient ID: Edward Fisher, male    DOB: 1966/07/03, 51 y.o.   MRN: [id number]  HPI: CYRIL PROBUS is a 51 y.o. male presenting on 11/26/2016 for Hyperlipidemia   HPI   Pt Doing well.  Denies CP, SOB  Due for f/u with cards in march  Pt never called GI per f/u (says he never got letter- see encounter 06/05/16)  Relevant past medical, surgical, family and social history reviewed and updated as indicated. Interim medical history since our last visit reviewed. Allergies and medications reviewed and updated.   Current Outpatient Prescriptions:  .  aspirin 81 MG chewable tablet, Chew 81 mg by mouth every evening. TO START DOSAGE IN 6 MONTHS APPROXIMATELY July 2014, Disp: , Rfl:  .  atorvastatin (LIPITOR) 20 MG tablet, Take 1 tablet (20 mg total) by mouth daily., Disp: 90 tablet, Rfl: 2 .  nitroGLYCERIN (NITROSTAT) 0.4 MG SL tablet, Place 1 tablet (0.4 mg total) under the tongue every 5 (five) minutes as needed for chest pain., Disp: 25 tablet, Rfl: 6 .  omega-3 acid ethyl esters (LOVAZA) 1 G capsule, Take 1 g by mouth every evening., Disp: , Rfl:    Review of Systems  Constitutional: Negative for appetite change, chills, diaphoresis, fatigue, fever and unexpected weight change.  HENT: Negative for congestion, dental problem, drooling, ear pain, facial swelling, hearing loss, mouth sores, sneezing, sore throat, trouble swallowing and voice change.   Eyes: Negative for pain, discharge, redness, itching and visual disturbance.  Respiratory: Negative for cough, choking, shortness of breath and wheezing.   Cardiovascular: Negative for chest pain, palpitations and leg swelling.  Gastrointestinal: Negative for abdominal pain, blood in stool, constipation, diarrhea and vomiting.  Endocrine: Negative for  cold intolerance, heat intolerance and polydipsia.  Genitourinary: Negative for decreased urine volume, dysuria and hematuria.  Musculoskeletal: Negative for arthralgias, back pain and gait problem.  Skin: Negative for rash.  Allergic/Immunologic: Negative for environmental allergies.  Neurological: Negative for seizures, syncope, light-headedness and headaches.  Hematological: Negative for adenopathy.  Psychiatric/Behavioral: Negative for agitation, dysphoric mood and suicidal ideas. The patient is not nervous/anxious.     Per HPI unless specifically indicated above     Objective:    BP 126/78 (BP Location: Left Arm, Patient Position: Sitting, Cuff Size: Normal)   Pulse 88   Temp 98.1 F (36.7 C)   Wt 195 lb 12 oz (88.8 kg)   SpO2 98%   BMI 28.09 kg/m   Wt Readings from Last 3 Encounters:  11/26/16 195 lb 12 oz (88.8 kg)  09/30/16 191 lb 6.4 oz (86.8 kg)  08/28/16 194 lb 9.6 oz (88.3 kg)    Physical Exam  Constitutional: He is oriented to person, place, and time. He appears well-developed and well-nourished.  HENT:  Head: Normocephalic and atraumatic.  Neck: Neck supple.  Cardiovascular: Normal rate and regular rhythm.   Pulmonary/Chest: Effort normal and breath sounds normal. He has no wheezes.  Abdominal: Soft. Bowel sounds are normal. There is no hepatosplenomegaly. There is no tenderness.  Musculoskeletal: He exhibits no edema.  Lymphadenopathy:    He has no cervical adenopathy.  Neurological: He is alert and oriented to person, place, and time.  Skin: Skin is  warm and dry.  Psychiatric: He has a normal mood and affect. His behavior is normal.  Vitals reviewed.   Results for orders placed or performed in visit on 11/19/16  Lipid Profile  Result Value Ref Range   Cholesterol 105 <200 mg/dL   Triglycerides 104 <150 mg/dL   HDL 32 (L) >40 mg/dL   Total CHOL/HDL Ratio 3.3 <5.0 Ratio   VLDL 21 <30 mg/dL   LDL Cholesterol 52 <100 mg/dL  Comprehensive metabolic  panel  Result Value Ref Range   Sodium 142 135 - 146 mmol/L   Potassium 4.7 3.5 - 5.3 mmol/L   Chloride 107 98 - 110 mmol/L   CO2 28 20 - 31 mmol/L   Glucose, Bld 101 (H) 65 - 99 mg/dL   BUN 13 7 - 25 mg/dL   Creat 1.00 0.70 - 1.33 mg/dL   Total Bilirubin 0.6 0.2 - 1.2 mg/dL   Alkaline Phosphatase 63 40 - 115 U/L   AST 16 10 - 35 U/L   ALT 21 9 - 46 U/L   Total Protein 7.0 6.1 - 8.1 g/dL   Albumin 4.4 3.6 - 5.1 g/dL   Calcium 9.5 8.6 - 10.3 mg/dL      Assessment & Plan:   Encounter Diagnoses  Name Primary?  [name] Arteriosclerotic cardiovascular disease (ASCVD) Yes  . Essential hypertension   . Hyperlipidemia, unspecified hyperlipidemia type      -reviewed labs with pt -pt to continue current Rx -pt given copy letter from GI so he can contact them -follow up 3 months.  RTO sooner prn

## 2016-12-17 ENCOUNTER — Telehealth: Payer: Self-pay

## 2016-12-17 NOTE — Telephone Encounter (Signed)
[id number] PATIENT RECEIVED LETTER TO SCHEDULE TCS

## 2016-12-24 ENCOUNTER — Telehealth: Payer: Self-pay

## 2016-12-24 NOTE — Telephone Encounter (Signed)
Triaged pt and he is going by [facility] to get paper work to sign up for [employer].

## 2016-12-26 NOTE — Telephone Encounter (Signed)
Pt has been triaged.

## 2017-01-06 NOTE — Telephone Encounter (Signed)
Ok to schedule.

## 2017-01-06 NOTE — Telephone Encounter (Signed)
Gastroenterology Pre-Procedure Review  Request Date: 12/26/2016 Requesting Physician: ON RECALL  PT WAS ON RECALL/ LAST COLONOSCOPY 07/01/2006   PATIENT REVIEW QUESTIONS: The patient responded to the following health history questions as indicated:    1. Diabetes Melitis: no 2. Joint replacements in the past 12 months: no 3. Major health problems in the past 3 months: no 4. Has an artificial valve or MVP: no 5. Has a defibrillator: no 6. Has been advised in past to take antibiotics in advance of a procedure like teeth cleaning: no 7. Family history of colon cancer: no  8. Alcohol Use: no 9. History of sleep apnea: no  10. History of coronary artery or other vascular stents placed within the last 12 months: no    MEDICATIONS & ALLERGIES:    Patient reports the following regarding taking any blood thinners:   Plavix? no Aspirin? yes Coumadin? no Brilinta? no Xarelto? no Eliquis? no Pradaxa? no Savaysa? no Effient? no  Patient confirms/reports the following medications:  Current Outpatient Prescriptions  Medication Sig Dispense Refill  . aspirin 81 MG chewable tablet Chew 81 mg by mouth every evening. TO START DOSAGE IN 6 MONTHS APPROXIMATELY July 2014    . atorvastatin (LIPITOR) 20 MG tablet Take 1 tablet (20 mg total) by mouth daily. 90 tablet 2  . omega-3 acid ethyl esters (LOVAZA) 1 G capsule Take 1 g by mouth every evening.    . nitroGLYCERIN (NITROSTAT) 0.4 MG SL tablet Place 1 tablet (0.4 mg total) under the tongue every 5 (five) minutes as needed for chest pain. (Patient not taking: Reported on 12/24/2016) 25 tablet 6   No current facility-administered medications for this visit.     Patient confirms/reports the following allergies:  Allergies  Allergen Reactions  . Pollen Extract Cough    No orders of the defined types were placed in this encounter.   AUTHORIZATION INFORMATION Primary Insurance:   ID #:  Group #:  Pre-Cert / Auth required:  Pre-Cert / Auth #:    Secondary Insurance:   ID #:   Group #:  Pre-Cert / Auth required:  Pre-Cert / Auth #:   SCHEDULE INFORMATION: Procedure has been scheduled as follows:  Date:                   Time:   Location:   This Gastroenterology Pre-Precedure Review Form is being routed to the following provider(s): R. Garfield Cornea, MD

## 2017-01-06 NOTE — Telephone Encounter (Signed)
Pt would like to wait until his fills out the [place] before he picks out a date

## 2017-01-06 NOTE — Telephone Encounter (Signed)
LMOM to call back

## 2017-01-06 NOTE — Telephone Encounter (Signed)
To Ginger to mail instructions.

## 2017-01-06 NOTE — Telephone Encounter (Signed)
To Ginger.

## 2017-01-07 NOTE — Telephone Encounter (Signed)
Noted  

## 2017-02-18 ENCOUNTER — Other Ambulatory Visit: Payer: Self-pay

## 2017-02-18 DIAGNOSIS — I2510 Atherosclerotic heart disease of native coronary artery without angina pectoris: Secondary | ICD-10-CM

## 2017-02-18 DIAGNOSIS — I10 Essential (primary) hypertension: Secondary | ICD-10-CM

## 2017-02-18 DIAGNOSIS — E785 Hyperlipidemia, unspecified: Secondary | ICD-10-CM

## 2017-02-24 ENCOUNTER — Ambulatory Visit: Payer: Self-pay | Admitting: Physician Assistant

## 2017-02-24 ENCOUNTER — Encounter: Payer: Self-pay | Admitting: Physician Assistant

## 2017-02-24 VITALS — BP 116/82 | HR 74 | Temp 98.2°F

## 2017-02-24 DIAGNOSIS — I2510 Atherosclerotic heart disease of native coronary artery without angina pectoris: Secondary | ICD-10-CM

## 2017-02-24 DIAGNOSIS — E785 Hyperlipidemia, unspecified: Secondary | ICD-10-CM

## 2017-02-24 NOTE — Progress Notes (Signed)
BP 116/82   Pulse 74   Temp 98.2 F (36.8 C)   SpO2 99%    Subjective:    Patient ID: Edward Fisher, male    DOB: 11-11-1965, 51 y.o.   MRN: [id number]  HPI: Edward Fisher is a 51 y.o. male presenting on 02/24/2017 for Hyperlipidemia   HPI Pt was due for f/u with cardiology in march and he hasn't yet  He says he has been talking with GI but hasn't been yet b/c he hasn't yet turned in his [employer] discount application  Pt went to get labs drawn on Saturday but they wouldn't draw them   Pt says he is doing well and has no complaints  Relevant past medical, surgical, family and social history reviewed and updated as indicated. Interim medical history since our last visit reviewed. Allergies and medications reviewed and updated.   Current Outpatient Prescriptions:  .  aspirin 81 MG chewable tablet, Chew 81 mg by mouth every evening. TO START DOSAGE IN 6 MONTHS APPROXIMATELY July 2014, Disp: , Rfl:  .  atorvastatin (LIPITOR) 20 MG tablet, Take 1 tablet (20 mg total) by mouth daily., Disp: 90 tablet, Rfl: 2 .  nitroGLYCERIN (NITROSTAT) 0.4 MG SL tablet, Place 1 tablet (0.4 mg total) under the tongue every 5 (five) minutes as needed for chest pain., Disp: 25 tablet, Rfl: 6 .  omega-3 acid ethyl esters (LOVAZA) 1 G capsule, Take 1 g by mouth every evening., Disp: , Rfl:    Review of Systems  Constitutional: Negative for appetite change, chills, diaphoresis, fatigue, fever and unexpected weight change.  HENT: Negative for congestion, dental problem, drooling, ear pain, facial swelling, hearing loss, mouth sores, sneezing, sore throat, trouble swallowing and voice change.   Eyes: Negative for pain, discharge, redness, itching and visual disturbance.  Respiratory: Negative for cough, choking, shortness of breath and wheezing.   Cardiovascular: Negative for chest pain, palpitations and leg swelling.  Gastrointestinal: Negative for abdominal pain, blood in stool, constipation, diarrhea and  vomiting.  Endocrine: Negative for cold intolerance, heat intolerance and polydipsia.  Genitourinary: Negative for decreased urine volume, dysuria and hematuria.  Musculoskeletal: Negative for arthralgias, back pain and gait problem.  Skin: Negative for rash.  Allergic/Immunologic: Negative for environmental allergies.  Neurological: Negative for seizures, syncope, light-headedness and headaches.  Hematological: Negative for adenopathy.  Psychiatric/Behavioral: Negative for agitation, dysphoric mood and suicidal ideas. The patient is not nervous/anxious.     Per HPI unless specifically indicated above     Objective:    BP 116/82   Pulse 74   Temp 98.2 F (36.8 C)   SpO2 99%   Wt Readings from Last 3 Encounters:  11/26/16 195 lb 12 oz (88.8 kg)  09/30/16 191 lb 6.4 oz (86.8 kg)  08/28/16 194 lb 9.6 oz (88.3 kg)    Physical Exam  Constitutional: He is oriented to person, place, and time. He appears well-developed and well-nourished.  HENT:  Head: Normocephalic and atraumatic.  Neck: Neck supple.  Cardiovascular: Normal rate and regular rhythm.   Pulmonary/Chest: Effort normal and breath sounds normal. He has no wheezes.  Abdominal: Soft. Bowel sounds are normal. There is no hepatosplenomegaly. There is no tenderness.  Musculoskeletal: He exhibits no edema.  Lymphadenopathy:    He has no cervical adenopathy.  Neurological: He is alert and oriented to person, place, and time.  Skin: Skin is warm and dry.  Psychiatric: He has a normal mood and affect. His behavior is normal.  Vitals  reviewed.        Assessment & Plan:   Encounter Diagnoses  Name Primary?  [name] Arteriosclerotic cardiovascular disease (ASCVD) Yes  . Hyperlipidemia, unspecified hyperlipidemia type     -Pt to go to lab when leaves office today (apparently some confusion with clinic changing labs NEXT Saturday).  Will call pt with results -encouraged pt to get [employer] discount application turned in and follow  up with GI and cardiology -no medication changes today -pt to follow up in 4 months.  RTO sooner prn

## 2017-02-25 LAB — LIPID PANEL
CHOLESTEROL: 103 mg/dL (ref <200)
HDL: 31 mg/dL — ABNORMAL LOW (ref >40)
LDL CALC: 50 mg/dL (ref <100)
TRIGLYCERIDES: 108 mg/dL (ref <150)
Total CHOL/HDL Ratio: 3.3 Ratio (ref <5.0)
VLDL: 22 mg/dL (ref <30)

## 2017-02-25 LAB — COMPREHENSIVE METABOLIC PANEL
ALT: 19 U/L (ref 9–46)
AST: 17 U/L (ref 10–35)
Albumin: 4.6 g/dL (ref 3.6–5.1)
Alkaline Phosphatase: 65 U/L (ref 40–115)
BILIRUBIN TOTAL: 0.7 mg/dL (ref 0.2–1.2)
BUN: 13 mg/dL (ref 7–25)
CALCIUM: 9.6 mg/dL (ref 8.6–10.3)
CHLORIDE: 106 mmol/L (ref 98–110)
CO2: 26 mmol/L (ref 20–31)
CREATININE: 0.97 mg/dL (ref 0.70–1.33)
Glucose, Bld: 87 mg/dL (ref 65–99)
Potassium: 3.8 mmol/L (ref 3.5–5.3)
Sodium: 140 mmol/L (ref 135–146)
Total Protein: 7.2 g/dL (ref 6.1–8.1)

## 2017-03-20 ENCOUNTER — Other Ambulatory Visit: Payer: Self-pay | Admitting: Physician Assistant

## 2017-03-26 ENCOUNTER — Other Ambulatory Visit: Payer: Self-pay | Admitting: Physician Assistant

## 2017-03-26 MED ORDER — ATORVASTATIN CALCIUM 20 MG PO TABS: 20.0000 mg | ORAL_TABLET | Freq: Every day | ORAL | 3 refills | Status: DC

## 2017-05-01 ENCOUNTER — Other Ambulatory Visit: Payer: Self-pay | Admitting: Physician Assistant

## 2017-05-01 DIAGNOSIS — Z125 Encounter for screening for malignant neoplasm of prostate: Secondary | ICD-10-CM

## 2017-05-01 DIAGNOSIS — I10 Essential (primary) hypertension: Secondary | ICD-10-CM

## 2017-05-01 DIAGNOSIS — E785 Hyperlipidemia, unspecified: Secondary | ICD-10-CM

## 2017-05-01 DIAGNOSIS — I2510 Atherosclerotic heart disease of native coronary artery without angina pectoris: Secondary | ICD-10-CM

## 2017-05-09 ENCOUNTER — Encounter: Payer: Self-pay | Admitting: *Deleted

## 2017-05-13 ENCOUNTER — Ambulatory Visit (INDEPENDENT_AMBULATORY_CARE_PROVIDER_SITE_OTHER): Payer: Self-pay | Admitting: Cardiovascular Disease

## 2017-05-13 ENCOUNTER — Encounter: Payer: Self-pay | Admitting: Cardiovascular Disease

## 2017-05-13 VITALS — BP 122/78 | HR 77 | Ht 70.0 in | Wt 191.8 lb

## 2017-05-13 DIAGNOSIS — I25810 Atherosclerosis of coronary artery bypass graft(s) without angina pectoris: Secondary | ICD-10-CM

## 2017-05-13 DIAGNOSIS — E7800 Pure hypercholesterolemia, unspecified: Secondary | ICD-10-CM

## 2017-05-13 MED ORDER — NITROGLYCERIN 0.4 MG SL SUBL: 0.4000 mg | SUBLINGUAL_TABLET | SUBLINGUAL | 3 refills | Status: DC | PRN

## 2017-05-13 NOTE — Patient Instructions (Signed)
Your physician wants you to follow-up in: 1 YEAR WITH DR KONESWARAN You will receive a reminder letter in the mail two months in advance. If you don't receive a letter, please call our office to schedule the follow-up appointment.  Your physician recommends that you continue on your current medications as directed. Please refer to the Current Medication list given to you today.  Thank you for choosing [facility]!!    

## 2017-05-13 NOTE — Progress Notes (Signed)
SUBJECTIVE: The patient presents for annual follow-up for his history of coronary artery disease and CABG performed in August 2013.  The patient denies any symptoms of chest pain, palpitations, shortness of breath, lightheadedness, dizziness, leg swelling, orthopnea, PND, and syncope.  He works at [employer] in [place].  ECG performed in the office today which I ordered and personally interpreted demonstrated sinus rhythm with nonspecific T-wave abnormalities.   Review of Systems: As per "subjective", otherwise negative.  Allergies  Allergen Reactions  . Pollen Extract Cough    Current Outpatient Prescriptions  Medication Sig Dispense Refill  . aspirin 81 MG chewable tablet Chew 81 mg by mouth every evening. TO START DOSAGE IN 6 MONTHS APPROXIMATELY July 2014    . atorvastatin (LIPITOR) 20 MG tablet Take 1 tablet (20 mg total) by mouth daily. 30 tablet 3  . nitroGLYCERIN (NITROSTAT) 0.4 MG SL tablet Place 1 tablet (0.4 mg total) under the tongue every 5 (five) minutes as needed for chest pain. 25 tablet 6  . omega-3 acid ethyl esters (LOVAZA) 1 G capsule Take 1 g by mouth every evening.     No current facility-administered medications for this visit.     Past Medical History:  Diagnosis Date  . Arteriosclerotic cardiovascular disease (ASCVD)    Onset in 04/2012; CABG in 06/2012  . Hyperlipidemia    Not thought to warrant pharmacologic therapy  . Insomnia   . Nephrolithiasis     Past Surgical History:  Procedure Laterality Date  . CORONARY ARTERY BYPASS GRAFT  06/24/2012   Procedure: CORONARY ARTERY BYPASS GRAFTING (CABG);  Surgeon: [name], MD;  Location: [place];  Service: Open Heart Surgery;  Laterality: N/A;  Coronary Artery  Bypass X 4 utilizing endoscopic saphenous vein graft and  Left radial Artery Harvest   . CYSTOSCOPY W/ RETROGRADES Left 08/23/2015   Procedure: CYSTOSCOPY WITH RETROGRADE PYELOGRAM;  Surgeon: [name], MD;   Location: [facility];  Service: Urology;  Laterality: Left;  . CYSTOSCOPY WITH URETEROSCOPY, STONE BASKETRY AND STENT PLACEMENT Left 08/23/2015   Procedure: CYSTOSCOPY WITH URETEROSCOPY, STONE BASKET EXTRACTION;  Surgeon: [name], MD;  Location: [facility];  Service: Urology;  Laterality: Left;  . HOLMIUM LASER APPLICATION Left 26/83/4196   Procedure: HOLMIUM LASER APPLICATION;  Surgeon: [name], MD;  Location: [facility];  Service: Urology;  Laterality: Left;  . LEFT HEART CATHETERIZATION WITH CORONARY ANGIOGRAM N/A 06/23/2012   Procedure: LEFT HEART CATHETERIZATION WITH CORONARY ANGIOGRAM;  Surgeon: [name], MD;  Location: [facility];  Service: Cardiovascular;  Laterality: N/A;  . RHINOPLASTY    . TONSILLECTOMY  ~1971    Social History   Social History  . Marital status: Married    Spouse name: N/A  . Number of children: N/A  . Years of education: N/A   Occupational History  . Engineer, agricultural   Social History Main Topics  . Smoking status: Never Smoker  . Smokeless tobacco: Former Systems developer    Types: [place] date: 11/04/1998     Comment: 06/22/2012 "quit chewing 10-15 years ago"  . Alcohol use No  . Drug use: No  . Sexual activity: Yes   Other Topics Concern  . Not on file   Social History Narrative   Regular exercise: No     Vitals:   05/13/17 1539  BP: 122/78  Pulse: 77  Weight: 191 lb 12.8 oz (87 kg)  Height: 5\' 10"  (1.778 m)    Wt Readings from Last 3 Encounters:  05/13/17 191 lb 12.8 oz (87 kg)  11/26/16 195 lb 12 oz (88.8 kg)  09/30/16 191 lb 6.4 oz (86.8 kg)     PHYSICAL EXAM General: NAD HEENT: Normal. Neck: No JVD, no thyromegaly. Lungs: Clear to auscultation bilaterally with normal respiratory effort. CV: Nondisplaced PMI.  Regular rate and rhythm, normal S1/S2, no S3/S4, no murmur. No pretibial or periankle edema.  No carotid bruit.   Abdomen: Soft, nontender, no distention.  Neurologic: Alert and oriented.    Psych: Normal affect. Skin: Normal. Musculoskeletal: No gross deformities.    ECG: Most recent ECG reviewed.   Labs: Lab Results  Component Value Date/Time   K 3.8 02/24/2017 03:04 PM   BUN 13 02/24/2017 03:04 PM   CREATININE 0.97 02/24/2017 03:04 PM   ALT 19 02/24/2017 03:04 PM   TSH 1.561 06/22/2012 05:34 PM   HGB 14.6 08/22/2015 01:00 PM     Lipids: Lab Results  Component Value Date/Time   LDLCALC 50 02/24/2017 03:02 PM   CHOL 103 02/24/2017 03:02 PM   TRIG 108 02/24/2017 03:02 PM   HDL 31 (L) 02/24/2017 03:02 PM       ASSESSMENT AND PLAN: 1. CAD: Stable ischemic heart disease. Low risk nuclear stress test in August 2015. No changes to therapy. I will refill nitroglycerin. He has not had to use it.  2. Hyperlipidemia: Currently on Lipitor 20 mg. LDL 50, total cholesterol 103, triglycerides 108, HDL 31 on 02/24/17. No changes to therapy.    Disposition: Follow up 1 year   Kate Sable, M.D., F.A.C.C.

## 2017-06-25 ENCOUNTER — Ambulatory Visit: Payer: Self-pay | Admitting: Physician Assistant

## 2017-07-01 ENCOUNTER — Other Ambulatory Visit (HOSPITAL_COMMUNITY)
Admission: RE | Admit: 2017-07-01 | Discharge: 2017-07-01 | Disposition: A | Discharge status: Home / Self Care | Payer: Self-pay | Source: Ambulatory Visit | Attending: Physician Assistant | Admitting: Physician Assistant

## 2017-07-01 DIAGNOSIS — Z125 Encounter for screening for malignant neoplasm of prostate: Secondary | ICD-10-CM | POA: Insufficient documentation

## 2017-07-01 DIAGNOSIS — E785 Hyperlipidemia, unspecified: Secondary | ICD-10-CM | POA: Insufficient documentation

## 2017-07-01 DIAGNOSIS — I10 Essential (primary) hypertension: Secondary | ICD-10-CM | POA: Insufficient documentation

## 2017-07-01 DIAGNOSIS — I2510 Atherosclerotic heart disease of native coronary artery without angina pectoris: Secondary | ICD-10-CM | POA: Insufficient documentation

## 2017-07-01 LAB — COMPREHENSIVE METABOLIC PANEL
ALBUMIN: 4.5 g/dL (ref 3.5–5.0)
ALK PHOS: 63 U/L (ref 38–126)
ALT: 28 U/L (ref 17–63)
AST: 23 U/L (ref 15–41)
Anion gap: 6 (ref 5–15)
BILIRUBIN TOTAL: 0.8 mg/dL (ref 0.3–1.2)
BUN: 19 mg/dL (ref 6–20)
CALCIUM: 9.3 mg/dL (ref 8.9–10.3)
CO2: 26 mmol/L (ref 22–32)
CREATININE: 0.93 mg/dL (ref 0.61–1.24)
Chloride: 107 mmol/L (ref 101–111)
GFR calc Af Amer: >60 mL/min (ref >60)
GFR calc non Af Amer: >60 mL/min (ref >60)
Glucose, Bld: 116 mg/dL — ABNORMAL HIGH (ref 65–99)
Potassium: 4 mmol/L (ref 3.5–5.1)
Sodium: 139 mmol/L (ref 135–145)
TOTAL PROTEIN: 7.5 g/dL (ref 6.5–8.1)

## 2017-07-01 LAB — LIPID PANEL
Cholesterol: 112 mg/dL (ref 0–200)
HDL: 32 mg/dL — ABNORMAL LOW (ref >40)
LDL Cholesterol: 65 mg/dL (ref 0–99)
Total CHOL/HDL Ratio: 3.5 RATIO
Triglycerides: 75 mg/dL (ref <150)
VLDL: 15 mg/dL (ref 0–40)

## 2017-07-01 LAB — PSA: PROSTATIC SPECIFIC ANTIGEN: 1.73 ng/mL (ref 0.00–4.00)

## 2017-07-02 ENCOUNTER — Ambulatory Visit: Payer: Self-pay | Admitting: Physician Assistant

## 2017-07-02 ENCOUNTER — Other Ambulatory Visit: Payer: Self-pay | Admitting: Physician Assistant

## 2017-07-02 ENCOUNTER — Encounter: Payer: Self-pay | Admitting: Physician Assistant

## 2017-07-02 VITALS — BP 120/76 | HR 90 | Temp 97.9°F | Ht 70.0 in | Wt 192.5 lb

## 2017-07-02 DIAGNOSIS — Z1211 Encounter for screening for malignant neoplasm of colon: Secondary | ICD-10-CM

## 2017-07-02 DIAGNOSIS — Z131 Encounter for screening for diabetes mellitus: Secondary | ICD-10-CM

## 2017-07-02 DIAGNOSIS — I2510 Atherosclerotic heart disease of native coronary artery without angina pectoris: Secondary | ICD-10-CM

## 2017-07-02 DIAGNOSIS — E785 Hyperlipidemia, unspecified: Secondary | ICD-10-CM

## 2017-07-02 NOTE — Progress Notes (Signed)
BP 120/76 (BP Location: Left Arm, Patient Position: Sitting, Cuff Size: Normal)   Pulse 90   Temp 97.9 F (36.6 C)   Ht 5\' 10"  (1.778 m)   Wt 192 lb 8 oz (87.3 kg)   SpO2 99%   BMI 27.62 kg/m    Subjective:    Patient ID: Edward Fisher, male    DOB: February 05, 1966, 51 y.o.   MRN: [id number]  HPI: Edward Fisher is a 51 y.o. male presenting on 07/02/2017 for Coronary Artery Disease and Hyperlipidemia (stopped fish oil about 2 weeks ago because his cousin told him he saw on tv it causes prostate cancer)   HPI   Chief Complaint  Patient presents with  . Coronary Artery Disease  . Hyperlipidemia    stopped fish oil about 2 weeks ago because his cousin told him he saw on tv it causes prostate cancer    Pt is feeling well.  No CP.   Relevant past medical, surgical, family and social history reviewed and updated as indicated. Interim medical history since our last visit reviewed. Allergies and medications reviewed and updated.   Current Outpatient Prescriptions:  .  aspirin 81 MG chewable tablet, Chew 81 mg by mouth every evening. TO START DOSAGE IN 6 MONTHS APPROXIMATELY July 2014, Disp: , Rfl:  .  atorvastatin (LIPITOR) 20 MG tablet, Take 1 tablet (20 mg total) by mouth daily., Disp: 30 tablet, Rfl: 3 .  nitroGLYCERIN (NITROSTAT) 0.4 MG SL tablet, Place 1 tablet (0.4 mg total) under the tongue every 5 (five) minutes as needed for chest pain., Disp: 25 tablet, Rfl: 3 .  omega-3 acid ethyl esters (LOVAZA) 1 G capsule, Take 1 g by mouth every evening., Disp: , Rfl:    Review of Systems  Constitutional: Negative for appetite change, chills, diaphoresis, fatigue, fever and unexpected weight change.  HENT: Negative for congestion, dental problem, drooling, ear pain, facial swelling, hearing loss, mouth sores, sneezing, sore throat, trouble swallowing and voice change.   Eyes: Negative for pain, discharge, redness, itching and visual disturbance.  Respiratory: Negative for cough, choking,  shortness of breath and wheezing.   Cardiovascular: Negative for chest pain, palpitations and leg swelling.  Gastrointestinal: Negative for abdominal pain, blood in stool, constipation, diarrhea and vomiting.  Endocrine: Negative for cold intolerance, heat intolerance and polydipsia.  Genitourinary: Negative for decreased urine volume, dysuria and hematuria.  Musculoskeletal: Negative for arthralgias, back pain and gait problem.  Skin: Negative for rash.  Allergic/Immunologic: Negative for environmental allergies.  Neurological: Negative for seizures, syncope, light-headedness and headaches.  Hematological: Negative for adenopathy.  Psychiatric/Behavioral: Negative for agitation, dysphoric mood and suicidal ideas. The patient is nervous/anxious.     Per HPI unless specifically indicated above     Objective:    BP 120/76 (BP Location: Left Arm, Patient Position: Sitting, Cuff Size: Normal)   Pulse 90   Temp 97.9 F (36.6 C)   Ht 5\' 10"  (1.778 m)   Wt 192 lb 8 oz (87.3 kg)   SpO2 99%   BMI 27.62 kg/m   Wt Readings from Last 3 Encounters:  07/02/17 192 lb 8 oz (87.3 kg)  05/13/17 191 lb 12.8 oz (87 kg)  11/26/16 195 lb 12 oz (88.8 kg)    Physical Exam  Constitutional: He is oriented to person, place, and time. He appears well-developed and well-nourished.  HENT:  Head: Normocephalic and atraumatic.  Neck: Neck supple.  Cardiovascular: Normal rate and regular rhythm.   Pulmonary/Chest: Effort normal  and breath sounds normal. He has no wheezes.  Abdominal: Soft. Bowel sounds are normal. There is no hepatosplenomegaly. There is no tenderness.  Musculoskeletal: He exhibits no edema.  Lymphadenopathy:    He has no cervical adenopathy.  Neurological: He is alert and oriented to person, place, and time.  Skin: Skin is warm and dry.  Psychiatric: He has a normal mood and affect. His behavior is normal.  Vitals reviewed.   Results for orders placed or performed during the  hospital encounter of 07/01/17  Comprehensive metabolic panel  Result Value Ref Range   Sodium 139 135 - 145 mmol/L   Potassium 4.0 3.5 - 5.1 mmol/L   Chloride 107 101 - 111 mmol/L   CO2 26 22 - 32 mmol/L   Glucose, Bld 116 (H) 65 - 99 mg/dL   BUN 19 6 - 20 mg/dL   Creatinine, Ser 0.93 0.61 - 1.24 mg/dL   Calcium 9.3 8.9 - 10.3 mg/dL   Total Protein 7.5 6.5 - 8.1 g/dL   Albumin 4.5 3.5 - 5.0 g/dL   AST 23 15 - 41 U/L   ALT 28 17 - 63 U/L   Alkaline Phosphatase 63 38 - 126 U/L   Total Bilirubin 0.8 0.3 - 1.2 mg/dL   GFR calc non Af Amer >60 >60 mL/min   GFR calc Af Amer >60 >60 mL/min   Anion gap 6 5 - 15  Lipid panel  Result Value Ref Range   Cholesterol 112 0 - 200 mg/dL   Triglycerides 75 <150 mg/dL   HDL 32 (L) >40 mg/dL   Total CHOL/HDL Ratio 3.5 RATIO   VLDL 15 0 - 40 mg/dL   LDL Cholesterol 65 0 - 99 mg/dL  PSA  Result Value Ref Range   Prostatic Specific Antigen 1.73 0.00 - 4.00 ng/mL      Assessment & Plan:   Encounter Diagnoses  Name Primary?  [name] Arteriosclerotic cardiovascular disease (ASCVD) Yes  . Hyperlipidemia, unspecified hyperlipidemia type   . Special screening for malignant neoplasms, colon   . Screening for diabetes mellitus     -reviewed labs with pt -pt to continue current medications.  Discussed with him that I have read no studies connecting fish oil with prostate cancer.   His PSA test is normal.  Encouraged pt to resume the fish oil to help his lipids in light of his established CAD -pt was given iFOBT for colon cancer screening -pt to follow up in 4 months.  RTO sooner prn

## 2017-07-21 LAB — IFOBT (OCCULT BLOOD): IFOBT: NEGATIVE

## 2017-07-22 ENCOUNTER — Other Ambulatory Visit: Payer: Self-pay | Admitting: Physician Assistant

## 2017-07-22 MED ORDER — ATORVASTATIN CALCIUM 20 MG PO TABS: 20.0000 mg | ORAL_TABLET | Freq: Every day | ORAL | 3 refills | Status: DC

## 2017-08-18 ENCOUNTER — Ambulatory Visit: Payer: Self-pay | Admitting: Physician Assistant

## 2017-09-17 ENCOUNTER — Other Ambulatory Visit: Payer: Self-pay | Admitting: Physician Assistant

## 2017-09-17 MED ORDER — ATORVASTATIN CALCIUM 20 MG PO TABS: 20.0000 mg | ORAL_TABLET | Freq: Every day | ORAL | 3 refills | Status: DC

## 2017-10-21 ENCOUNTER — Ambulatory Visit: Payer: Self-pay | Admitting: Physician Assistant

## 2017-10-21 ENCOUNTER — Encounter: Payer: Self-pay | Admitting: Physician Assistant

## 2017-10-21 VITALS — BP 126/78 | HR 73 | Temp 98.1°F | Ht 70.0 in | Wt 194.0 lb

## 2017-10-21 DIAGNOSIS — L02212 Cutaneous abscess of back [any part, except buttock]: Secondary | ICD-10-CM

## 2017-10-21 MED ORDER — SULFAMETHOXAZOLE-TRIMETHOPRIM 800-160 MG PO TABS: 1.0000 | ORAL_TABLET | Freq: Two times a day (BID) | ORAL | 0 refills | Status: DC

## 2017-10-21 NOTE — Patient Instructions (Signed)

## 2017-10-21 NOTE — Progress Notes (Signed)
BP 126/78 (BP Location: Left Arm, Patient Position: Sitting, Cuff Size: Large)   Pulse 73   Temp 98.1 F (36.7 C) (Other (Comment))   Ht 5\' 10"  (1.778 m)   Wt 194 lb (88 kg)   SpO2 98%   BMI 27.84 kg/m    Subjective:    Patient ID: Edward Fisher, male    DOB: Nov 28, 1965, 51 y.o.   MRN: [id number]  HPI: Edward Fisher is a 51 y.o. male presenting on 10/21/2017 for No chief complaint on file.   HPI   Abscess on back x 4 days.  It is one of the sebaceous cysts that he has had for years that has never bothered him   It has been draining a small amount.   Relevant past medical, surgical, family and social history reviewed and updated as indicated. Interim medical history since our last visit reviewed. Allergies and medications reviewed and updated.  Review of Systems  Constitutional: Negative for appetite change, chills, diaphoresis, fatigue, fever and unexpected weight change.  HENT: Negative for congestion, dental problem, drooling, ear pain, facial swelling, hearing loss, mouth sores, sneezing, sore throat, trouble swallowing and voice change.   Eyes: Negative for pain, discharge, redness, itching and visual disturbance.  Respiratory: Negative for cough, choking, shortness of breath and wheezing.   Cardiovascular: Negative for chest pain, palpitations and leg swelling.  Gastrointestinal: Negative for abdominal pain, blood in stool, constipation, diarrhea and vomiting.  Endocrine: Negative for cold intolerance, heat intolerance and polydipsia.  Genitourinary: Negative for decreased urine volume, dysuria and hematuria.  Musculoskeletal: Negative for arthralgias, back pain and gait problem.  Skin: Negative for rash.  Allergic/Immunologic: Negative for environmental allergies.  Neurological: Negative for seizures, syncope, light-headedness and headaches.  Hematological: Negative for adenopathy.  Psychiatric/Behavioral: Negative for agitation, dysphoric mood and suicidal ideas. The  patient is not nervous/anxious.     Per HPI unless specifically indicated above     Objective:    BP 126/78 (BP Location: Left Arm, Patient Position: Sitting, Cuff Size: Large)   Pulse 73   Temp 98.1 F (36.7 C) (Other (Comment))   Ht 5\' 10"  (1.778 m)   Wt 194 lb (88 kg)   SpO2 98%   BMI 27.84 kg/m   Wt Readings from Last 3 Encounters:  10/21/17 194 lb (88 kg)  07/02/17 192 lb 8 oz (87.3 kg)  05/13/17 191 lb 12.8 oz (87 kg)    Physical Exam  Constitutional: He is oriented to person, place, and time. He appears well-developed and well-nourished.  HENT:  Head: Normocephalic and atraumatic.  Pulmonary/Chest: Effort normal.  Neurological: He is alert and oriented to person, place, and time. He has normal reflexes.  Skin: Skin is warm and dry.     Inflamed tender abscess center upper back  Psychiatric: He has a normal mood and affect. His behavior is normal.  Nursing note and vitals reviewed.       Assessment & Plan:    After answering all of pt's questions,  Pt signed informed consent for I & D  Area cleaned with hibiclens Instilled 2.5 cc of lidocaine with epi.  Opened with # 11 blade.  Scant pus expressed.  Appears to be multiple small pus pockets.  Packed with plain gauze.  Dry sterile dressing applied.     Pt tolerated well and ambulated from room without assistance      Encounter Diagnosis  Name Primary?  . Cutaneous abscess of back excluding buttocks Yes  rx septra ds. Pt to use hot water bottle or heating pad on it F/u for recheck 2 days.  RTO sooner prn

## 2017-10-23 ENCOUNTER — Ambulatory Visit: Payer: Self-pay | Admitting: Physician Assistant

## 2017-10-23 ENCOUNTER — Encounter: Payer: Self-pay | Admitting: Physician Assistant

## 2017-10-23 VITALS — BP 124/70 | HR 86 | Temp 98.2°F | Ht 70.0 in | Wt 196.5 lb

## 2017-10-23 DIAGNOSIS — L02212 Cutaneous abscess of back [any part, except buttock]: Secondary | ICD-10-CM

## 2017-10-23 NOTE — Progress Notes (Signed)
   BP 124/70 (BP Location: Left Arm, Patient Position: Sitting, Cuff Size: Normal)   Pulse 86   Temp 98.2 F (36.8 C)   Ht 5\' 10"  (1.778 m)   Wt 196 lb 8 oz (89.1 kg)   SpO2 99%   BMI 28.19 kg/m    Subjective:    Patient ID: Edward Fisher, male    DOB: 1966/06/22, 51 y.o.   MRN: [id number]  HPI: Edward Fisher is a 51 y.o. male presenting on 10/23/2017 for Abscess (to recheck)   HPI Pt states he is taking antibiotic and usuing hot water bottle.  Some decrease in discomfort  Relevant past medical, surgical, family and social history reviewed and updated as indicated. Interim medical history since our last visit reviewed. Allergies and medications reviewed and updated.  Review of Systems  Constitutional: Negative for appetite change, chills, diaphoresis, fatigue, fever and unexpected weight change.  HENT: Negative for congestion, dental problem, drooling, ear pain, facial swelling, hearing loss, mouth sores, sneezing, sore throat, trouble swallowing and voice change.   Eyes: Negative for pain, discharge, redness, itching and visual disturbance.  Respiratory: Negative for cough, choking, shortness of breath and wheezing.   Cardiovascular: Negative for chest pain, palpitations and leg swelling.  Gastrointestinal: Negative for abdominal pain, blood in stool, constipation, diarrhea and vomiting.  Endocrine: Negative for cold intolerance, heat intolerance and polydipsia.  Genitourinary: Negative for decreased urine volume, dysuria and hematuria.  Musculoskeletal: Negative for arthralgias, back pain and gait problem.  Skin: Negative for rash.  Allergic/Immunologic: Negative for environmental allergies.  Neurological: Negative for seizures, syncope, light-headedness and headaches.  Hematological: Negative for adenopathy.  Psychiatric/Behavioral: Negative for agitation, dysphoric mood and suicidal ideas. The patient is not nervous/anxious.     Per HPI unless specifically indicated  above     Objective:    BP 124/70 (BP Location: Left Arm, Patient Position: Sitting, Cuff Size: Normal)   Pulse 86   Temp 98.2 F (36.8 C)   Ht 5\' 10"  (1.778 m)   Wt 196 lb 8 oz (89.1 kg)   SpO2 99%   BMI 28.19 kg/m   Wt Readings from Last 3 Encounters:  10/23/17 196 lb 8 oz (89.1 kg)  10/21/17 194 lb (88 kg)  07/02/17 192 lb 8 oz (87.3 kg)    Physical Exam  Constitutional: He is oriented to person, place, and time. He appears well-developed and well-nourished.  HENT:  Head: Normocephalic and atraumatic.  Pulmonary/Chest: Effort normal.  Neurological: He is oriented to person, place, and time.  Skin: Skin is warm and dry.     Abscess middle of upper back with packing still in place.  + purulent drainage with foul odor.  Mild pressure applied causes significant pus to be expressed from multiple tiny openings.  Packing removed.  Dry sterile dressing applied.   Psychiatric: He has a normal mood and affect. His behavior is normal.        Assessment & Plan:   Encounter Diagnosis  Name Primary?  . Cutaneous abscess of back excluding buttocks Yes    -pt to continue antibiotics -pt to continue to apply hot water bottle.  Also encouraged pt to soak in tub -pt to follow up for routine appt on 11/05/17 as scheduled.  RTO sooner prn

## 2017-10-29 ENCOUNTER — Other Ambulatory Visit (HOSPITAL_COMMUNITY)
Admission: RE | Admit: 2017-10-29 | Discharge: 2017-10-29 | Disposition: A | Discharge status: Home / Self Care | Payer: Self-pay | Source: Ambulatory Visit | Attending: Physician Assistant | Admitting: Physician Assistant

## 2017-10-29 DIAGNOSIS — Z131 Encounter for screening for diabetes mellitus: Secondary | ICD-10-CM | POA: Insufficient documentation

## 2017-10-29 DIAGNOSIS — I2510 Atherosclerotic heart disease of native coronary artery without angina pectoris: Secondary | ICD-10-CM | POA: Insufficient documentation

## 2017-10-29 DIAGNOSIS — E785 Hyperlipidemia, unspecified: Secondary | ICD-10-CM | POA: Insufficient documentation

## 2017-10-29 LAB — COMPREHENSIVE METABOLIC PANEL
ALT: 24 U/L (ref 17–63)
ANION GAP: 9 (ref 5–15)
AST: 19 U/L (ref 15–41)
Albumin: 4.4 g/dL (ref 3.5–5.0)
Alkaline Phosphatase: 80 U/L (ref 38–126)
BUN: 19 mg/dL (ref 6–20)
CALCIUM: 9.5 mg/dL (ref 8.9–10.3)
CHLORIDE: 104 mmol/L (ref 101–111)
CO2: 24 mmol/L (ref 22–32)
CREATININE: 1.11 mg/dL (ref 0.61–1.24)
Glucose, Bld: 109 mg/dL — ABNORMAL HIGH (ref 65–99)
Potassium: 4.6 mmol/L (ref 3.5–5.1)
SODIUM: 137 mmol/L (ref 135–145)
Total Bilirubin: 0.5 mg/dL (ref 0.3–1.2)
Total Protein: 7.9 g/dL (ref 6.5–8.1)

## 2017-10-29 LAB — HEMOGLOBIN A1C
Hgb A1c MFr Bld: 10.6 % — ABNORMAL HIGH (ref 4.8–5.6)
Mean Plasma Glucose: 257.52 mg/dL

## 2017-10-29 LAB — LIPID PANEL
CHOL/HDL RATIO: 5.1 ratio
Cholesterol: 157 mg/dL (ref 0–200)
HDL: 31 mg/dL — AB (ref >40)
LDL CALC: 102 mg/dL — AB (ref 0–99)
Triglycerides: 120 mg/dL (ref <150)
VLDL: 24 mg/dL (ref 0–40)

## 2017-11-05 ENCOUNTER — Ambulatory Visit: Payer: Self-pay | Admitting: Physician Assistant

## 2017-11-05 ENCOUNTER — Encounter: Payer: Self-pay | Admitting: Physician Assistant

## 2017-11-05 VITALS — BP 120/70 | HR 79 | Temp 99.5°F

## 2017-11-05 DIAGNOSIS — I2510 Atherosclerotic heart disease of native coronary artery without angina pectoris: Secondary | ICD-10-CM

## 2017-11-05 DIAGNOSIS — I10 Essential (primary) hypertension: Secondary | ICD-10-CM

## 2017-11-05 DIAGNOSIS — E785 Hyperlipidemia, unspecified: Secondary | ICD-10-CM

## 2017-11-05 DIAGNOSIS — E1165 Type 2 diabetes mellitus with hyperglycemia: Secondary | ICD-10-CM | POA: Insufficient documentation

## 2017-11-05 DIAGNOSIS — L02212 Cutaneous abscess of back [any part, except buttock]: Secondary | ICD-10-CM

## 2017-11-05 DIAGNOSIS — E119 Type 2 diabetes mellitus without complications: Secondary | ICD-10-CM

## 2017-11-05 LAB — GLUCOSE, POCT (MANUAL RESULT ENTRY): POC GLUCOSE: 97 mg/dL (ref 70–99)

## 2017-11-05 MED ORDER — METFORMIN HCL 1000 MG PO TABS: 1000.0000 mg | ORAL_TABLET | Freq: Two times a day (BID) | ORAL | 1 refills | Status: DC

## 2017-11-05 NOTE — Progress Notes (Signed)
BP 120/70 (BP Location: Left Arm, Patient Position: Sitting, Cuff Size: Normal)   Pulse 79   Temp 99.5 F (37.5 C)   SpO2 98%    Subjective:    Patient ID: Edward Fisher, male    DOB: 10-29-1966, 52 y.o.   MRN: [id number]  HPI: Edward Fisher is a 52 y.o. male presenting on 11/05/2017 for Coronary Artery Disease and Hyperlipidemia   HPI   Pt finished his antibiotics for his abscess and he has continued to use warm compresses.  He says it still hurts a lot and has been draining a lot.     Pt also complains of urinary frequency without pain.  Relevant past medical, surgical, family and social history reviewed and updated as indicated. Interim medical history since our last visit reviewed. Allergies and medications reviewed and updated.  CURRENT MEDS: Asa 81mg  qd Atorvastatin 20mg  qd NTG 0.4mg  SL prn Fish oil 1 po qd Saw palmetto qd   Review of Systems  Constitutional: Negative for appetite change, chills, diaphoresis, fatigue, fever and unexpected weight change.  HENT: Negative for congestion, dental problem, drooling, ear pain, facial swelling, hearing loss, mouth sores, sneezing, sore throat, trouble swallowing and voice change.   Eyes: Negative for pain, discharge, redness, itching and visual disturbance.  Respiratory: Negative for cough, choking, shortness of breath and wheezing.   Cardiovascular: Negative for chest pain, palpitations and leg swelling.  Gastrointestinal: Negative for abdominal pain, blood in stool, constipation, diarrhea and vomiting.  Endocrine: Negative for cold intolerance, heat intolerance and polydipsia.  Genitourinary: Negative for decreased urine volume, dysuria and hematuria.  Musculoskeletal: Negative for arthralgias, back pain and gait problem.  Skin: Negative for rash.  Allergic/Immunologic: Negative for environmental allergies.  Neurological: Negative for seizures, syncope, light-headedness and headaches.  Hematological: Negative for  adenopathy.  Psychiatric/Behavioral: Negative for agitation, dysphoric mood and suicidal ideas. The patient is not nervous/anxious.     Per HPI unless specifically indicated above     Objective:    BP 120/70 (BP Location: Left Arm, Patient Position: Sitting, Cuff Size: Normal)   Pulse 79   Temp 99.5 F (37.5 C)   SpO2 98%   Wt Readings from Last 3 Encounters:  10/23/17 196 lb 8 oz (89.1 kg)  10/21/17 194 lb (88 kg)  07/02/17 192 lb 8 oz (87.3 kg)    Physical Exam  Constitutional: He is oriented to person, place, and time. He appears well-developed and well-nourished.  HENT:  Head: Normocephalic and atraumatic.  Neck: Neck supple.  Cardiovascular: Normal rate and regular rhythm.  Pulmonary/Chest: Effort normal and breath sounds normal. He has no wheezes.  Abdominal: Soft. Bowel sounds are normal. There is no hepatosplenomegaly. There is no tenderness.  Musculoskeletal: He exhibits no edema.  Lymphadenopathy:    He has no cervical adenopathy.  Neurological: He is alert and oriented to person, place, and time.  Skin: Skin is warm and dry. Lesion noted.     Abscess.  After discussion, pt signed informed consent.  Area cleaned with hibiclens. Instilled 1%lidocaine with epi.  Opened with # 11 blade.  Copious pus and thick sebum expelled.  Broke up loculations. Packed with steril 1/4 in gauze.  Dry sterile dressing applied.  Pt tolerated well and ambulated from room without assistance.   Psychiatric: He has a normal mood and affect. His behavior is normal.  Vitals reviewed.     a1c 10.7     Assessment & Plan:   Encounter Diagnoses  Name Primary?  [name]  Type 2 diabetes mellitus without complication, without long-term current use of insulin (HCC) Yes  . Cutaneous abscess of back excluding buttocks   . Uncontrolled type 2 diabetes mellitus with hyperglycemia ([place])   . Arteriosclerotic cardiovascular disease (ASCVD)   . Hyperlipidemia, unspecified hyperlipidemia type   .  Essential hypertension    -Reviewed labs with pt -rx metformin -counseled pt on diabetic diet and gave handout -pt is given Glucometer and taught how to use it. He is counseled to call office for fbs > 300 or < 70. -after discussion, pt agreed to repeat I&D abscess on back which was done with success. -No more antibiotics, just wound care. Pt to change dressing daily,  Remove packing 48 hours.  -pt to attend DM education class -Foot exam done today. -will refer for diabetic eye exam -pt to Continue current meds -pt to RTO next week to recheck abscess and tolerance to metformin.  RTO sooner prn

## 2017-11-05 NOTE — Patient Instructions (Signed)
Diabetes Mellitus and Nutrition When you have diabetes (diabetes mellitus), it is very important to have healthy eating habits because your blood sugar (glucose) levels are greatly affected by what you eat and drink. Eating healthy foods in the appropriate amounts, at about the same times every day, can help you:  Control your blood glucose.  Lower your risk of heart disease.  Improve your blood pressure.  Reach or maintain a healthy weight.  Every person with diabetes is different, and each person has different needs for a meal plan. Your health care provider may recommend that you work with a diet and nutrition specialist (dietitian) to make a meal plan that is best for you. Your meal plan may vary depending on factors such as:  The calories you need.  The medicines you take.  Your weight.  Your blood glucose, blood pressure, and cholesterol levels.  Your activity level.  Other health conditions you have, such as heart or kidney disease.  How do carbohydrates affect me? Carbohydrates affect your blood glucose level more than any other type of food. Eating carbohydrates naturally increases the amount of glucose in your blood. Carbohydrate counting is a method for keeping track of how many carbohydrates you eat. Counting carbohydrates is important to keep your blood glucose at a healthy level, especially if you use insulin or take certain oral diabetes medicines. It is important to know how many carbohydrates you can safely have in each meal. This is different for every person. Your dietitian can help you calculate how many carbohydrates you should have at each meal and for snack. Foods that contain carbohydrates include:  Bread, cereal, rice, pasta, and crackers.  Potatoes and corn.  Peas, beans, and lentils.  Milk and yogurt.  Fruit and juice.  Desserts, such as cakes, cookies, ice cream, and candy.  How does alcohol affect me? Alcohol can cause a sudden decrease in blood  glucose (hypoglycemia), especially if you use insulin or take certain oral diabetes medicines. Hypoglycemia can be a life-threatening condition. Symptoms of hypoglycemia (sleepiness, dizziness, and confusion) are similar to symptoms of having too much alcohol. If your health care provider says that alcohol is safe for you, follow these guidelines:  Limit alcohol intake to no more than 1 drink per day for nonpregnant women and 2 drinks per day for men. One drink equals 12 oz of beer, 5 oz of wine, or 1 oz of hard liquor.  Do not drink on an empty stomach.  Keep yourself hydrated with water, diet soda, or unsweetened iced tea.  Keep in mind that regular soda, juice, and other mixers may contain a lot of sugar and must be counted as carbohydrates.  What are tips for following this plan? Reading food labels  Start by checking the serving size on the label. The amount of calories, carbohydrates, fats, and other nutrients listed on the label are based on one serving of the food. Many foods contain more than one serving per package.  Check the total grams (g) of carbohydrates in one serving. You can calculate the number of servings of carbohydrates in one serving by dividing the total carbohydrates by 15. For example, if a food has 30 g of total carbohydrates, it would be equal to 2 servings of carbohydrates.  Check the number of grams (g) of saturated and trans fats in one serving. Choose foods that have low or no amount of these fats.  Check the number of milligrams (mg) of sodium in one serving. Most people   should limit total sodium intake to less than 2,300 mg per day.  Always check the nutrition information of foods labeled as "low-fat" or "nonfat". These foods may be higher in added sugar or refined carbohydrates and should be avoided.  Talk to your dietitian to identify your daily goals for nutrients listed on the label. Shopping  Avoid buying canned, premade, or processed foods. These  foods tend to be high in fat, sodium, and added sugar.  Shop around the outside edge of the grocery store. This includes fresh fruits and vegetables, bulk grains, fresh meats, and fresh dairy. Cooking  Use low-heat cooking methods, such as baking, instead of high-heat cooking methods like deep frying.  Cook using healthy oils, such as olive, canola, or sunflower oil.  Avoid cooking with butter, cream, or high-fat meats. Meal planning  Eat meals and snacks regularly, preferably at the same times every day. Avoid going long periods of time without eating.  Eat foods high in fiber, such as fresh fruits, vegetables, beans, and whole grains. Talk to your dietitian about how many servings of carbohydrates you can eat at each meal.  Eat 4-6 ounces of lean protein each day, such as lean meat, chicken, fish, eggs, or tofu. 1 ounce is equal to 1 ounce of meat, chicken, or fish, 1 egg, or 1/4 cup of tofu.  Eat some foods each day that contain healthy fats, such as avocado, nuts, seeds, and fish. Lifestyle   Check your blood glucose regularly.  Exercise at least 30 minutes 5 or more days each week, or as told by your health care provider.  Take medicines as told by your health care provider.  Do not use any products that contain nicotine or tobacco, such as cigarettes and e-cigarettes. If you need help quitting, ask your health care provider.  Work with a counselor or diabetes educator to identify strategies to manage stress and any emotional and social challenges. What are some questions to ask my health care provider?  Do I need to meet with a diabetes educator?  Do I need to meet with a dietitian?  What number can I call if I have questions?  When are the best times to check my blood glucose? Where to find more information:  American Diabetes Association: diabetes.org/food-and-fitness/food  Academy of Nutrition and Dietetics:  www.eatright.org/resources/health/diseases-and-conditions/diabetes  National Institute of Diabetes and Digestive and Kidney Diseases (NIH): www.niddk.nih.gov/health-information/diabetes/overview/diet-eating-physical-activity Summary  A healthy meal plan will help you control your blood glucose and maintain a healthy lifestyle.  Working with a diet and nutrition specialist (dietitian) can help you make a meal plan that is best for you.  Keep in mind that carbohydrates and alcohol have immediate effects on your blood glucose levels. It is important to count carbohydrates and to use alcohol carefully. This information is not intended to replace advice given to you by your health care provider. Make sure you discuss any questions you have with your health care provider. Document Released: 07/18/2005 Document Revised: 11/25/2016 Document Reviewed: 11/25/2016 Elsevier Interactive Patient Education  2018 Elsevier Inc.  

## 2017-11-13 ENCOUNTER — Encounter: Payer: Self-pay | Admitting: Physician Assistant

## 2017-11-13 ENCOUNTER — Ambulatory Visit: Payer: Self-pay | Admitting: Physician Assistant

## 2017-11-13 VITALS — BP 106/70 | HR 82 | Temp 97.9°F | Ht 70.0 in | Wt 195.5 lb

## 2017-11-13 DIAGNOSIS — E119 Type 2 diabetes mellitus without complications: Secondary | ICD-10-CM

## 2017-11-13 DIAGNOSIS — I2510 Atherosclerotic heart disease of native coronary artery without angina pectoris: Secondary | ICD-10-CM

## 2017-11-13 DIAGNOSIS — E785 Hyperlipidemia, unspecified: Secondary | ICD-10-CM

## 2017-11-13 DIAGNOSIS — L02212 Cutaneous abscess of back [any part, except buttock]: Secondary | ICD-10-CM

## 2017-11-13 DIAGNOSIS — I10 Essential (primary) hypertension: Secondary | ICD-10-CM

## 2017-11-13 MED ORDER — METFORMIN HCL 500 MG PO TABS: 500.0000 mg | ORAL_TABLET | Freq: Two times a day (BID) | ORAL | 3 refills | Status: DC

## 2017-11-13 NOTE — Progress Notes (Signed)
BP 106/70 (BP Location: Left Arm, Patient Position: Sitting, Cuff Size: Large)   Pulse 82   Temp 97.9 F (36.6 C) (Other (Comment))   Ht 5\' 10"  (1.778 m)   Wt 195 lb 8 oz (88.7 kg)   SpO2 98%   BMI 28.05 kg/m    Subjective:    Patient ID: Edward Fisher, male    DOB: 1966/05/12, 52 y.o.   MRN: [id number]  HPI: Edward Fisher is a 52 y.o. male presenting on 11/13/2017 for Abscess (states it's feeling better) and Diabetes (wants to discuss otc for cold S&S)   HPI   Pt says he cut back his metformin to 1/2 bid due to diarrhea.    bs log good- 86-113.  Pt hasn't been to DM education class yet- hasn't been able to get her on the phone.   His back abscess is a lot better  Relevant past medical, surgical, family and social history reviewed and updated as indicated. Interim medical history since our last visit reviewed. Allergies and medications reviewed and updated.   Current Outpatient Medications:  .  aspirin 81 MG chewable tablet, Chew 81 mg by mouth every evening. TO START DOSAGE IN 6 MONTHS APPROXIMATELY July 2014, Disp: , Rfl:  .  atorvastatin (LIPITOR) 20 MG tablet, Take 1 tablet (20 mg total) daily by mouth., Disp: 30 tablet, Rfl: 3 .  metFORMIN (GLUCOPHAGE) 1000 MG tablet, Take 1 tablet (1,000 mg total) by mouth 2 (two) times daily with a meal., Disp: 60 tablet, Rfl: 1 .  nitroGLYCERIN (NITROSTAT) 0.4 MG SL tablet, Place 1 tablet (0.4 mg total) under the tongue every 5 (five) minutes as needed for chest pain., Disp: 25 tablet, Rfl: 3 .  omega-3 acid ethyl esters (LOVAZA) 1 G capsule, Take 1 g by mouth every evening., Disp: , Rfl:  .  Saw Palmetto, Serenoa repens, (SAW PALMETTO PO), Take by mouth., Disp: , Rfl:   Review of Systems  Constitutional: Negative for appetite change, chills, diaphoresis, fatigue, fever and unexpected weight change.  HENT: Negative for congestion, dental problem, drooling, ear pain, facial swelling, hearing loss, mouth sores, sneezing, sore throat,  trouble swallowing and voice change.   Eyes: Negative for pain, discharge, redness, itching and visual disturbance.  Respiratory: Negative for cough, choking, shortness of breath and wheezing.   Cardiovascular: Negative for chest pain, palpitations and leg swelling.  Gastrointestinal: Negative for abdominal pain, blood in stool, constipation, diarrhea and vomiting.  Endocrine: Negative for cold intolerance, heat intolerance and polydipsia.  Genitourinary: Negative for decreased urine volume, dysuria and hematuria.  Musculoskeletal: Negative for arthralgias, back pain and gait problem.  Skin: Negative for rash.  Allergic/Immunologic: Negative for environmental allergies.  Neurological: Negative for seizures, syncope, light-headedness and headaches.  Hematological: Negative for adenopathy.  Psychiatric/Behavioral: Negative for agitation, dysphoric mood and suicidal ideas. The patient is not nervous/anxious.     Per HPI unless specifically indicated above     Objective:    BP 106/70 (BP Location: Left Arm, Patient Position: Sitting, Cuff Size: Large)   Pulse 82   Temp 97.9 F (36.6 C) (Other (Comment))   Ht 5\' 10"  (1.778 m)   Wt 195 lb 8 oz (88.7 kg)   SpO2 98%   BMI 28.05 kg/m   Wt Readings from Last 3 Encounters:  11/13/17 195 lb 8 oz (88.7 kg)  10/23/17 196 lb 8 oz (89.1 kg)  10/21/17 194 lb (88 kg)    Physical Exam  Constitutional: He is oriented  to person, place, and time. He appears well-developed and well-nourished.  HENT:  Head: Normocephalic and atraumatic.  Neck: Neck supple.  Cardiovascular: Normal rate and regular rhythm.  Pulmonary/Chest: Effort normal and breath sounds normal. He has no wheezes.  Abdominal: Soft. Bowel sounds are normal. There is no hepatosplenomegaly. There is no tenderness.  Musculoskeletal: He exhibits no edema.  Lymphadenopathy:    He has no cervical adenopathy.  Neurological: He is alert and oriented to person, place, and time.  Skin:  Skin is warm and dry.  Abscess back open, no purulence seen.  Surrounding tissue without erythema.  Psychiatric: He has a normal mood and affect. His behavior is normal.  Vitals reviewed.       Assessment & Plan:   Encounter Diagnoses  Name Primary?  . Type 2 diabetes mellitus without complication, without long-term current use of insulin ([place]) Yes  . Cutaneous abscess of back excluding buttocks      -Pt to continue metformin 500 mg bid.  He is to monitor his fbs and notify office for fbs < 70 or > 300. -pt to follow up in 2 month with labs before appointment.  -He is to RTO sooner prn problems with blood sugar or if abscess worsens

## 2018-01-08 ENCOUNTER — Other Ambulatory Visit (HOSPITAL_COMMUNITY)
Admission: RE | Admit: 2018-01-08 | Discharge: 2018-01-08 | Disposition: A | Discharge status: Home / Self Care | Payer: Self-pay | Source: Ambulatory Visit | Attending: Physician Assistant | Admitting: Physician Assistant

## 2018-01-08 DIAGNOSIS — E785 Hyperlipidemia, unspecified: Secondary | ICD-10-CM | POA: Insufficient documentation

## 2018-01-08 DIAGNOSIS — I10 Essential (primary) hypertension: Secondary | ICD-10-CM | POA: Insufficient documentation

## 2018-01-08 DIAGNOSIS — I2510 Atherosclerotic heart disease of native coronary artery without angina pectoris: Secondary | ICD-10-CM | POA: Insufficient documentation

## 2018-01-08 DIAGNOSIS — E119 Type 2 diabetes mellitus without complications: Secondary | ICD-10-CM | POA: Insufficient documentation

## 2018-01-08 LAB — COMPREHENSIVE METABOLIC PANEL
ALK PHOS: 64 U/L (ref 38–126)
ALT: 20 U/L (ref 17–63)
AST: 16 U/L (ref 15–41)
Albumin: 4.3 g/dL (ref 3.5–5.0)
Anion gap: 8 (ref 5–15)
BILIRUBIN TOTAL: 1.1 mg/dL (ref 0.3–1.2)
BUN: 16 mg/dL (ref 6–20)
CO2: 25 mmol/L (ref 22–32)
CREATININE: 0.92 mg/dL (ref 0.61–1.24)
Calcium: 9.5 mg/dL (ref 8.9–10.3)
Chloride: 106 mmol/L (ref 101–111)
GFR calc Af Amer: >60 mL/min (ref >60)
Glucose, Bld: 119 mg/dL — ABNORMAL HIGH (ref 65–99)
POTASSIUM: 4.5 mmol/L (ref 3.5–5.1)
Sodium: 139 mmol/L (ref 135–145)
TOTAL PROTEIN: 7.8 g/dL (ref 6.5–8.1)

## 2018-01-08 LAB — LIPID PANEL
CHOLESTEROL: 108 mg/dL (ref 0–200)
HDL: 34 mg/dL — ABNORMAL LOW (ref >40)
LDL Cholesterol: 60 mg/dL (ref 0–99)
TRIGLYCERIDES: 70 mg/dL (ref <150)
Total CHOL/HDL Ratio: 3.2 RATIO
VLDL: 14 mg/dL (ref 0–40)

## 2018-01-09 LAB — HEMOGLOBIN A1C
Hgb A1c MFr Bld: 5.5 % (ref 4.8–5.6)
Mean Plasma Glucose: 111 mg/dL

## 2018-01-12 ENCOUNTER — Encounter: Payer: Self-pay | Admitting: Physician Assistant

## 2018-01-12 ENCOUNTER — Ambulatory Visit: Payer: Self-pay | Admitting: Physician Assistant

## 2018-01-12 VITALS — BP 126/82 | HR 73 | Temp 97.9°F | Ht 70.0 in | Wt 191.0 lb

## 2018-01-12 DIAGNOSIS — E785 Hyperlipidemia, unspecified: Secondary | ICD-10-CM

## 2018-01-12 DIAGNOSIS — I2510 Atherosclerotic heart disease of native coronary artery without angina pectoris: Secondary | ICD-10-CM

## 2018-01-12 NOTE — Progress Notes (Signed)
BP 126/82 (BP Location: Right Arm, Patient Position: Sitting, Cuff Size: Normal)   Pulse 73   Temp 97.9 F (36.6 C) (Other (Comment))   Ht 5\' 10"  (1.778 m)   Wt 191 lb (86.6 kg)   SpO2 98%   BMI 27.41 kg/m    Subjective:    Patient ID: Edward Fisher, male    DOB: 1966/07/29, 52 y.o.   MRN: [id number]  HPI: Edward Fisher is a 52 y.o. male presenting on 01/12/2018 for Diabetes   HPI   Pt is doing well.  No complaints  Relevant past medical, surgical, family and social history reviewed and updated as indicated. Interim medical history since our last visit reviewed. Allergies and medications reviewed and updated.   Current Outpatient Medications:  .  aspirin 81 MG chewable tablet, Chew 81 mg by mouth every evening. TO START DOSAGE IN 6 MONTHS APPROXIMATELY July 2014, Disp: , Rfl:  .  atorvastatin (LIPITOR) 20 MG tablet, Take 1 tablet (20 mg total) daily by mouth., Disp: 30 tablet, Rfl: 3 .  metFORMIN (GLUCOPHAGE) 500 MG tablet, Take 1 tablet (500 mg total) by mouth 2 (two) times daily with a meal., Disp: 60 tablet, Rfl: 3 .  nitroGLYCERIN (NITROSTAT) 0.4 MG SL tablet, Place 1 tablet (0.4 mg total) under the tongue every 5 (five) minutes as needed for chest pain., Disp: 25 tablet, Rfl: 3 .  omega-3 acid ethyl esters (LOVAZA) 1 G capsule, Take 1 g by mouth every evening., Disp: , Rfl:  .  Saw Palmetto, Serenoa repens, (SAW PALMETTO PO), Take by mouth., Disp: , Rfl:    Review of Systems  Constitutional: Negative for appetite change, chills, diaphoresis, fatigue, fever and unexpected weight change.  HENT: Negative for congestion, dental problem, drooling, ear pain, facial swelling, hearing loss, mouth sores, sneezing, sore throat, trouble swallowing and voice change.   Eyes: Negative for pain, discharge, redness, itching and visual disturbance.  Respiratory: Negative for cough, choking, shortness of breath and wheezing.   Cardiovascular: Negative for chest pain, palpitations and leg  swelling.  Gastrointestinal: Negative for abdominal pain, blood in stool, constipation, diarrhea and vomiting.  Endocrine: Negative for cold intolerance, heat intolerance and polydipsia.  Genitourinary: Negative for decreased urine volume, dysuria and hematuria.  Musculoskeletal: Negative for arthralgias, back pain and gait problem.  Skin: Negative for rash.  Allergic/Immunologic: Negative for environmental allergies.  Neurological: Negative for seizures, syncope, light-headedness and headaches.  Hematological: Negative for adenopathy.  Psychiatric/Behavioral: Negative for agitation, dysphoric mood and suicidal ideas. The patient is not nervous/anxious.     Per HPI unless specifically indicated above     Objective:    BP 126/82 (BP Location: Right Arm, Patient Position: Sitting, Cuff Size: Normal)   Pulse 73   Temp 97.9 F (36.6 C) (Other (Comment))   Ht 5\' 10"  (1.778 m)   Wt 191 lb (86.6 kg)   SpO2 98%   BMI 27.41 kg/m   Wt Readings from Last 3 Encounters:  01/12/18 191 lb (86.6 kg)  11/13/17 195 lb 8 oz (88.7 kg)  10/23/17 196 lb 8 oz (89.1 kg)    Physical Exam  Constitutional: He is oriented to person, place, and time. He appears well-developed and well-nourished.  HENT:  Head: Normocephalic and atraumatic.  Neck: Neck supple.  Cardiovascular: Normal rate and regular rhythm.  Pulmonary/Chest: Effort normal and breath sounds normal. He has no wheezes.  Abdominal: Soft. Bowel sounds are normal. There is no hepatosplenomegaly. There is no tenderness.  Musculoskeletal: He exhibits no edema.  Lymphadenopathy:    He has no cervical adenopathy.  Neurological: He is alert and oriented to person, place, and time.  Skin: Skin is warm and dry.  Psychiatric: He has a normal mood and affect. His behavior is normal.  Vitals reviewed.   Results for orders placed or performed during the hospital encounter of 01/08/18  Lipid panel  Result Value Ref Range   Cholesterol 108 0 - 200  mg/dL   Triglycerides 70 <150 mg/dL   HDL 34 (L) >40 mg/dL   Total CHOL/HDL Ratio 3.2 RATIO   VLDL 14 0 - 40 mg/dL   LDL Cholesterol 60 0 - 99 mg/dL  Comprehensive metabolic panel  Result Value Ref Range   Sodium 139 135 - 145 mmol/L   Potassium 4.5 3.5 - 5.1 mmol/L   Chloride 106 101 - 111 mmol/L   CO2 25 22 - 32 mmol/L   Glucose, Bld 119 (H) 65 - 99 mg/dL   BUN 16 6 - 20 mg/dL   Creatinine, Ser 0.92 0.61 - 1.24 mg/dL   Calcium 9.5 8.9 - 10.3 mg/dL   Total Protein 7.8 6.5 - 8.1 g/dL   Albumin 4.3 3.5 - 5.0 g/dL   AST 16 15 - 41 U/L   ALT 20 17 - 63 U/L   Alkaline Phosphatase 64 38 - 126 U/L   Total Bilirubin 1.1 0.3 - 1.2 mg/dL   GFR calc non Af Amer >60 >60 mL/min   GFR calc Af Amer >60 >60 mL/min   Anion gap 8 5 - 15  Hemoglobin A1c  Result Value Ref Range   Hgb A1c MFr Bld 5.5 4.8 - 5.6 %   Mean Plasma Glucose 111 mg/dL      Assessment & Plan:    Encounter Diagnoses  Name Primary?  [name] Arteriosclerotic cardiovascular disease (ASCVD) Yes  . Hyperlipidemia, unspecified hyperlipidemia type     -reviewed labs with pt -discussed possibly incorrect a1c test 3 months ago -pt to Stop metformin.  He is to continue to [employer] .  Recheck a1c in 3 months- -pt to continue other meds Pt to follow up sooner prn

## 2018-01-13 ENCOUNTER — Other Ambulatory Visit: Payer: Self-pay | Admitting: Physician Assistant

## 2018-01-13 DIAGNOSIS — E785 Hyperlipidemia, unspecified: Secondary | ICD-10-CM

## 2018-01-13 DIAGNOSIS — E119 Type 2 diabetes mellitus without complications: Secondary | ICD-10-CM

## 2018-01-13 DIAGNOSIS — I2510 Atherosclerotic heart disease of native coronary artery without angina pectoris: Secondary | ICD-10-CM

## 2018-03-03 ENCOUNTER — Other Ambulatory Visit: Payer: Self-pay | Admitting: Physician Assistant

## 2018-03-03 MED ORDER — ATORVASTATIN CALCIUM 20 MG PO TABS: 20.0000 mg | ORAL_TABLET | Freq: Every day | ORAL | 6 refills | Status: DC

## 2018-04-09 ENCOUNTER — Other Ambulatory Visit (HOSPITAL_COMMUNITY)
Admission: RE | Admit: 2018-04-09 | Discharge: 2018-04-09 | Disposition: A | Discharge status: Home / Self Care | Payer: Self-pay | Source: Ambulatory Visit | Attending: Physician Assistant | Admitting: Physician Assistant

## 2018-04-09 DIAGNOSIS — E785 Hyperlipidemia, unspecified: Secondary | ICD-10-CM | POA: Insufficient documentation

## 2018-04-09 DIAGNOSIS — E119 Type 2 diabetes mellitus without complications: Secondary | ICD-10-CM | POA: Insufficient documentation

## 2018-04-09 DIAGNOSIS — I2510 Atherosclerotic heart disease of native coronary artery without angina pectoris: Secondary | ICD-10-CM | POA: Insufficient documentation

## 2018-04-09 LAB — LIPID PANEL
CHOL/HDL RATIO: 3.6 ratio
CHOLESTEROL: 113 mg/dL (ref 0–200)
HDL: 31 mg/dL — AB (ref >40)
LDL Cholesterol: 67 mg/dL (ref 0–99)
Triglycerides: 73 mg/dL (ref <150)
VLDL: 15 mg/dL (ref 0–40)

## 2018-04-09 LAB — COMPREHENSIVE METABOLIC PANEL
ALT: 32 U/L (ref 17–63)
ANION GAP: 5 (ref 5–15)
AST: 22 U/L (ref 15–41)
Albumin: 4.2 g/dL (ref 3.5–5.0)
Alkaline Phosphatase: 58 U/L (ref 38–126)
BILIRUBIN TOTAL: 0.7 mg/dL (ref 0.3–1.2)
BUN: 16 mg/dL (ref 6–20)
CHLORIDE: 108 mmol/L (ref 101–111)
CO2: 28 mmol/L (ref 22–32)
Calcium: 9.5 mg/dL (ref 8.9–10.3)
Creatinine, Ser: 0.99 mg/dL (ref 0.61–1.24)
GFR calc Af Amer: >60 mL/min (ref >60)
GFR calc non Af Amer: >60 mL/min (ref >60)
Glucose, Bld: 122 mg/dL — ABNORMAL HIGH (ref 65–99)
POTASSIUM: 4.2 mmol/L (ref 3.5–5.1)
Sodium: 141 mmol/L (ref 135–145)
TOTAL PROTEIN: 7.3 g/dL (ref 6.5–8.1)

## 2018-04-10 LAB — MICROALBUMIN, URINE

## 2018-04-11 LAB — HEMOGLOBIN A1C
HEMOGLOBIN A1C: 5.6 % (ref 4.8–5.6)
Mean Plasma Glucose: 114 mg/dL

## 2018-04-13 ENCOUNTER — Ambulatory Visit: Payer: Self-pay | Admitting: Physician Assistant

## 2018-04-13 ENCOUNTER — Encounter: Payer: Self-pay | Admitting: Physician Assistant

## 2018-04-13 VITALS — BP 119/75 | HR 84 | Temp 98.2°F | Ht 70.0 in | Wt 197.2 lb

## 2018-04-13 DIAGNOSIS — E785 Hyperlipidemia, unspecified: Secondary | ICD-10-CM

## 2018-04-13 DIAGNOSIS — I2510 Atherosclerotic heart disease of native coronary artery without angina pectoris: Secondary | ICD-10-CM

## 2018-04-13 DIAGNOSIS — Z125 Encounter for screening for malignant neoplasm of prostate: Secondary | ICD-10-CM

## 2018-04-13 NOTE — Progress Notes (Signed)
BP 119/75 (BP Location: Right Arm, Patient Position: Sitting, Cuff Size: Normal)   Pulse 84   Temp 98.2 F (36.8 C)   Ht 5\' 10"  (1.778 m)   Wt 197 lb 4 oz (89.5 kg)   SpO2 98%   BMI 28.30 kg/m    Subjective:    Patient ID: Edward Fisher, male    DOB: 1966/07/26, 52 y.o.   MRN: [id number]  HPI: Edward Fisher is a 52 y.o. male presenting on 04/13/2018 for Hyperlipidemia and Blood Sugar Problem   HPI   Pt is feeling well.    He is not having any CP or SOB.   Relevant past medical, surgical, family and social history reviewed and updated as indicated. Interim medical history since our last visit reviewed. Allergies and medications reviewed and updated.   Current Outpatient Medications:  .  aspirin 81 MG chewable tablet, Chew 81 mg by mouth every evening. TO START DOSAGE IN 6 MONTHS APPROXIMATELY July 2014, Disp: , Rfl:  .  atorvastatin (LIPITOR) 20 MG tablet, Take 1 tablet (20 mg total) by mouth daily., Disp: 30 tablet, Rfl: 6 .  nitroGLYCERIN (NITROSTAT) 0.4 MG SL tablet, Place 1 tablet (0.4 mg total) under the tongue every 5 (five) minutes as needed for chest pain., Disp: 25 tablet, Rfl: 3 .  omega-3 acid ethyl esters (LOVAZA) 1 G capsule, Take 1 g by mouth every evening., Disp: , Rfl:  .  Saw Palmetto, Serenoa repens, (SAW PALMETTO PO), Take by mouth., Disp: , Rfl:    Review of Systems  Constitutional: Negative for appetite change, chills, diaphoresis, fatigue, fever and unexpected weight change.  HENT: Negative for congestion, drooling, ear pain, facial swelling, hearing loss, mouth sores, sneezing, sore throat, trouble swallowing and voice change.   Eyes: Negative for pain, discharge, redness, itching and visual disturbance.  Respiratory: Negative for cough, choking, shortness of breath and wheezing.   Cardiovascular: Negative for chest pain, palpitations and leg swelling.  Gastrointestinal: Negative for abdominal pain, blood in stool, constipation, diarrhea and vomiting.   Endocrine: Negative for cold intolerance, heat intolerance and polydipsia.  Genitourinary: Negative for decreased urine volume, dysuria and hematuria.  Musculoskeletal: Negative for arthralgias, back pain and gait problem.  Skin: Negative for rash.  Allergic/Immunologic: Negative for environmental allergies.  Neurological: Negative for seizures, syncope, light-headedness and headaches.  Hematological: Negative for adenopathy.  Psychiatric/Behavioral: Negative for agitation, dysphoric mood and suicidal ideas. The patient is not nervous/anxious.     Per HPI unless specifically indicated above     Objective:    BP 119/75 (BP Location: Right Arm, Patient Position: Sitting, Cuff Size: Normal)   Pulse 84   Temp 98.2 F (36.8 C)   Ht 5\' 10"  (1.778 m)   Wt 197 lb 4 oz (89.5 kg)   SpO2 98%   BMI 28.30 kg/m   Wt Readings from Last 3 Encounters:  04/13/18 197 lb 4 oz (89.5 kg)  01/12/18 191 lb (86.6 kg)  11/13/17 195 lb 8 oz (88.7 kg)    Physical Exam  Constitutional: He is oriented to person, place, and time. He appears well-developed and well-nourished.  HENT:  Head: Normocephalic and atraumatic.  Neck: Neck supple.  Cardiovascular: Normal rate and regular rhythm.  Pulmonary/Chest: Effort normal and breath sounds normal. He has no wheezes.  Abdominal: Soft. Bowel sounds are normal. There is no hepatosplenomegaly. There is no tenderness.  Musculoskeletal: He exhibits no edema.  Lymphadenopathy:    He has no cervical adenopathy.  Neurological: He is alert and oriented to person, place, and time.  Skin: Skin is warm and dry.  Psychiatric: He has a normal mood and affect. His behavior is normal.  Vitals reviewed.   Results for orders placed or performed during the hospital encounter of 04/09/18  Microalbumin, urine  Result Value Ref Range   Microalb, Ur <3.0 (H) Not Estab. ug/mL  Comprehensive metabolic panel  Result Value Ref Range   Sodium 141 135 - 145 mmol/L   Potassium  4.2 3.5 - 5.1 mmol/L   Chloride 108 101 - 111 mmol/L   CO2 28 22 - 32 mmol/L   Glucose, Bld 122 (H) 65 - 99 mg/dL   BUN 16 6 - 20 mg/dL   Creatinine, Ser 0.99 0.61 - 1.24 mg/dL   Calcium 9.5 8.9 - 10.3 mg/dL   Total Protein 7.3 6.5 - 8.1 g/dL   Albumin 4.2 3.5 - 5.0 g/dL   AST 22 15 - 41 U/L   ALT 32 17 - 63 U/L   Alkaline Phosphatase 58 38 - 126 U/L   Total Bilirubin 0.7 0.3 - 1.2 mg/dL   GFR calc non Af Amer >60 >60 mL/min   GFR calc Af Amer >60 >60 mL/min   Anion gap 5 5 - 15  Lipid panel  Result Value Ref Range   Cholesterol 113 0 - 200 mg/dL   Triglycerides 73 <150 mg/dL   HDL 31 (L) >40 mg/dL   Total CHOL/HDL Ratio 3.6 RATIO   VLDL 15 0 - 40 mg/dL   LDL Cholesterol 67 0 - 99 mg/dL  Hemoglobin A1c  Result Value Ref Range   Hgb A1c MFr Bld 5.6 4.8 - 5.6 %   Mean Plasma Glucose 114 mg/dL      Assessment & Plan:   Encounter Diagnoses  Name Primary?  [name] Arteriosclerotic cardiovascular disease (ASCVD) Yes  . Hyperlipidemia, unspecified hyperlipidemia type   . Screening for prostate cancer     -reviewed labs with pt -pt is NOT diabetic -pt to continue current medications -counseled on maintaining consistent weight. Encouraged regular exercise -pt to follow up 4 months.  RTO sooner prn

## 2018-08-07 ENCOUNTER — Other Ambulatory Visit (HOSPITAL_COMMUNITY)
Admission: RE | Admit: 2018-08-07 | Discharge: 2018-08-07 | Disposition: A | Discharge status: Home / Self Care | Payer: Self-pay | Source: Ambulatory Visit | Attending: Physician Assistant | Admitting: Physician Assistant

## 2018-08-07 DIAGNOSIS — Z125 Encounter for screening for malignant neoplasm of prostate: Secondary | ICD-10-CM | POA: Insufficient documentation

## 2018-08-07 DIAGNOSIS — E785 Hyperlipidemia, unspecified: Secondary | ICD-10-CM | POA: Insufficient documentation

## 2018-08-07 DIAGNOSIS — I2510 Atherosclerotic heart disease of native coronary artery without angina pectoris: Secondary | ICD-10-CM | POA: Insufficient documentation

## 2018-08-07 LAB — COMPREHENSIVE METABOLIC PANEL
ALBUMIN: 4.4 g/dL (ref 3.5–5.0)
ALT: 32 U/L (ref 0–44)
AST: 23 U/L (ref 15–41)
Alkaline Phosphatase: 57 U/L (ref 38–126)
Anion gap: 7 (ref 5–15)
BUN: 18 mg/dL (ref 6–20)
CALCIUM: 9.3 mg/dL (ref 8.9–10.3)
CO2: 25 mmol/L (ref 22–32)
CREATININE: 0.99 mg/dL (ref 0.61–1.24)
Chloride: 109 mmol/L (ref 98–111)
GFR calc non Af Amer: >60 mL/min (ref >60)
GLUCOSE: 111 mg/dL — AB (ref 70–99)
Potassium: 3.8 mmol/L (ref 3.5–5.1)
SODIUM: 141 mmol/L (ref 135–145)
Total Bilirubin: 0.8 mg/dL (ref 0.3–1.2)
Total Protein: 7.5 g/dL (ref 6.5–8.1)

## 2018-08-07 LAB — LIPID PANEL
CHOLESTEROL: 127 mg/dL (ref 0–200)
HDL: 27 mg/dL — AB (ref >40)
LDL Cholesterol: 73 mg/dL (ref 0–99)
Total CHOL/HDL Ratio: 4.7 RATIO
Triglycerides: 133 mg/dL (ref <150)
VLDL: 27 mg/dL (ref 0–40)

## 2018-08-07 LAB — PSA: Prostatic Specific Antigen: 1.86 ng/mL (ref 0.00–4.00)

## 2018-08-13 ENCOUNTER — Ambulatory Visit: Payer: Self-pay | Admitting: Physician Assistant

## 2018-08-13 ENCOUNTER — Encounter: Payer: Self-pay | Admitting: Physician Assistant

## 2018-08-13 VITALS — BP 118/76 | HR 86 | Temp 98.4°F

## 2018-08-13 DIAGNOSIS — F419 Anxiety disorder, unspecified: Secondary | ICD-10-CM

## 2018-08-13 DIAGNOSIS — E785 Hyperlipidemia, unspecified: Secondary | ICD-10-CM

## 2018-08-13 DIAGNOSIS — I2510 Atherosclerotic heart disease of native coronary artery without angina pectoris: Secondary | ICD-10-CM

## 2018-08-13 MED ORDER — CITALOPRAM HYDROBROMIDE 20 MG PO TABS: 20.0000 mg | ORAL_TABLET | Freq: Every day | ORAL | 0 refills | Status: DC

## 2018-08-13 NOTE — Progress Notes (Signed)
BP 118/76   Pulse 86   Temp 98.4 F (36.9 C)   SpO2 97%    Subjective:    Patient ID: Edward Fisher, male    DOB: August 17, 1966, 52 y.o.   MRN: [id number]  HPI: Edward Fisher is a 52 y.o. male presenting on 08/13/2018 for Hyperlipidemia and Coronary Artery Disease   HPI  Pt is doing well.  He does report some anxiety recently.  He denies depression.  Relevant past medical, surgical, family and social history reviewed and updated as indicated. Interim medical history since our last visit reviewed. Allergies and medications reviewed and updated.   Current Outpatient Medications:  .  aspirin 81 MG chewable tablet, Chew 81 mg by mouth every evening. TO START DOSAGE IN 6 MONTHS APPROXIMATELY July 2014, Disp: , Rfl:  .  atorvastatin (LIPITOR) 20 MG tablet, Take 1 tablet (20 mg total) by mouth daily., Disp: 30 tablet, Rfl: 6 .  MELATONIN PO, Take by mouth., Disp: , Rfl:  .  nitroGLYCERIN (NITROSTAT) 0.4 MG SL tablet, Place 1 tablet (0.4 mg total) under the tongue every 5 (five) minutes as needed for chest pain., Disp: 25 tablet, Rfl: 3 .  omega-3 acid ethyl esters (LOVAZA) 1 G capsule, Take 1 g by mouth every evening., Disp: , Rfl:  .  Saw Palmetto, Serenoa repens, (SAW PALMETTO PO), Take by mouth., Disp: , Rfl:   Review of Systems  Constitutional: Negative for appetite change, chills, diaphoresis, fatigue, fever and unexpected weight change.  HENT: Negative for congestion, dental problem, drooling, ear pain, facial swelling, hearing loss, mouth sores, sneezing, sore throat, trouble swallowing and voice change.   Eyes: Negative for pain, discharge, redness, itching and visual disturbance.  Respiratory: Negative for cough, choking, shortness of breath and wheezing.   Cardiovascular: Negative for chest pain, palpitations and leg swelling.  Gastrointestinal: Negative for abdominal pain, blood in stool, constipation, diarrhea and vomiting.  Endocrine: Negative for cold intolerance, heat  intolerance and polydipsia.  Genitourinary: Negative for decreased urine volume, dysuria and hematuria.  Musculoskeletal: Negative for arthralgias, back pain and gait problem.  Skin: Negative for rash.  Allergic/Immunologic: Negative for environmental allergies.  Neurological: Negative for seizures, syncope, light-headedness and headaches.  Hematological: Negative for adenopathy.  Psychiatric/Behavioral: Negative for agitation, dysphoric mood and suicidal ideas. The patient is nervous/anxious.     Per HPI unless specifically indicated above     Objective:    BP 118/76   Pulse 86   Temp 98.4 F (36.9 C)   SpO2 97%   Wt Readings from Last 3 Encounters:  04/13/18 197 lb 4 oz (89.5 kg)  01/12/18 191 lb (86.6 kg)  11/13/17 195 lb 8 oz (88.7 kg)    Physical Exam  Constitutional: He is oriented to person, place, and time. He appears well-developed and well-nourished.  HENT:  Head: Normocephalic and atraumatic.  Neck: Neck supple.  Cardiovascular: Normal rate and regular rhythm.  Pulmonary/Chest: Effort normal and breath sounds normal. He has no wheezes.  Abdominal: Soft. Bowel sounds are normal. There is no hepatosplenomegaly. There is no tenderness.  Musculoskeletal: He exhibits no edema.  Lymphadenopathy:    He has no cervical adenopathy.  Neurological: He is alert and oriented to person, place, and time.  Skin: Skin is warm and dry.  Psychiatric: He has a normal mood and affect. His behavior is normal.  Vitals reviewed.   Results for orders placed or performed during the hospital encounter of 08/07/18  Lipid panel  Result  Value Ref Range   Cholesterol 127 0 - 200 mg/dL   Triglycerides 133 <150 mg/dL   HDL 27 (L) >40 mg/dL   Total CHOL/HDL Ratio 4.7 RATIO   VLDL 27 0 - 40 mg/dL   LDL Cholesterol 73 0 - 99 mg/dL  Comprehensive metabolic panel  Result Value Ref Range   Sodium 141 135 - 145 mmol/L   Potassium 3.8 3.5 - 5.1 mmol/L   Chloride 109 98 - 111 mmol/L   CO2 25  22 - 32 mmol/L   Glucose, Bld 111 (H) 70 - 99 mg/dL   BUN 18 6 - 20 mg/dL   Creatinine, Ser 0.99 0.61 - 1.24 mg/dL   Calcium 9.3 8.9 - 10.3 mg/dL   Total Protein 7.5 6.5 - 8.1 g/dL   Albumin 4.4 3.5 - 5.0 g/dL   AST 23 15 - 41 U/L   ALT 32 0 - 44 U/L   Alkaline Phosphatase 57 38 - 126 U/L   Total Bilirubin 0.8 0.3 - 1.2 mg/dL   GFR calc non Af Amer >60 >60 mL/min   GFR calc Af Amer >60 >60 mL/min   Anion gap 7 5 - 15  PSA  Result Value Ref Range   Prostatic Specific Antigen 1.86 0.00 - 4.00 ng/mL      Assessment & Plan:    Encounter Diagnoses  Name Primary?  [name] Arteriosclerotic cardiovascular disease (ASCVD) Yes  . Hyperlipidemia, unspecified hyperlipidemia type   . Anxiety     -reviewed labs with pt -pt to continue current medications and healthy lifestyle -trial of citalopram to help his anxiety. -pt to follow up 3 weeks.  RTO sooner prn

## 2018-08-31 ENCOUNTER — Ambulatory Visit: Payer: Self-pay | Admitting: Physician Assistant

## 2018-09-02 ENCOUNTER — Ambulatory Visit: Payer: Self-pay | Admitting: Physician Assistant

## 2018-09-02 ENCOUNTER — Encounter: Payer: Self-pay | Admitting: Physician Assistant

## 2018-09-02 ENCOUNTER — Other Ambulatory Visit: Payer: Self-pay | Admitting: Physician Assistant

## 2018-09-02 VITALS — BP 113/69 | HR 75 | Temp 98.1°F | Ht 70.0 in | Wt 187.8 lb

## 2018-09-02 DIAGNOSIS — Z1211 Encounter for screening for malignant neoplasm of colon: Secondary | ICD-10-CM

## 2018-09-02 DIAGNOSIS — G4700 Insomnia, unspecified: Secondary | ICD-10-CM

## 2018-09-02 DIAGNOSIS — E785 Hyperlipidemia, unspecified: Secondary | ICD-10-CM

## 2018-09-02 DIAGNOSIS — F419 Anxiety disorder, unspecified: Secondary | ICD-10-CM

## 2018-09-02 DIAGNOSIS — I2510 Atherosclerotic heart disease of native coronary artery without angina pectoris: Secondary | ICD-10-CM

## 2018-09-02 MED ORDER — PAROXETINE HCL 20 MG PO TABS: 20.0000 mg | ORAL_TABLET | Freq: Every day | ORAL | 0 refills | Status: DC

## 2018-09-02 NOTE — Progress Notes (Signed)
BP 113/69 (BP Location: Right Arm, Patient Position: Sitting, Cuff Size: Normal)   Pulse 75   Temp 98.1 F (36.7 C)   Ht 5\' 10"  (1.778 m)   Wt 187 lb 12 oz (85.2 kg)   SpO2 98%   BMI 26.94 kg/m    Subjective:    Patient ID: Edward Fisher, male    DOB: 1966-10-18, 52 y.o.   MRN: [id number]  HPI: Edward Fisher is a 52 y.o. male presenting on 09/02/2018 for Mental Health Problem (pt states med makes him feels nauseous. pt thinks his main problem is he is not sleeping well)   HPI   Chief Complaint  Patient presents with  . Mental Health Problem    pt states med makes him feels nauseous. pt thinks his main problem is he is not sleeping well    Pt thinks his anxiety may be due to he doesn't sleep well.  He takes melatonin here and there but not regularly.  Pt usually drinks 1 coffee daily and 1 or 2 sodas.    Pt denies depression, just feels anxiety frequently.  He says he has started disliking some new things like [place]'t like [place] and he doesn't like to drive for longer than 1 1/2 hours.  He worries about these things and other things.   Relevant past medical, surgical, family and social history reviewed and updated as indicated. Interim medical history since our last visit reviewed. Allergies and medications reviewed and updated.   Current Outpatient Medications:  .  aspirin 81 MG chewable tablet, Chew 81 mg by mouth every evening. TO START DOSAGE IN 6 MONTHS APPROXIMATELY July 2014, Disp: , Rfl:  .  atorvastatin (LIPITOR) 20 MG tablet, Take 1 tablet (20 mg total) by mouth daily., Disp: 30 tablet, Rfl: 6 .  MELATONIN PO, Take by mouth., Disp: , Rfl:  .  nitroGLYCERIN (NITROSTAT) 0.4 MG SL tablet, Place 1 tablet (0.4 mg total) under the tongue every 5 (five) minutes as needed for chest pain., Disp: 25 tablet, Rfl: 3 .  omega-3 acid ethyl esters (LOVAZA) 1 G capsule, Take 1 g by mouth every evening., Disp: , Rfl:  .  citalopram (CELEXA) 20 MG tablet,  Take 1 tablet (20 mg total) by mouth daily. (Patient not taking: Reported on 09/02/2018), Disp: 30 tablet, Rfl: 0 .  Saw Palmetto, Serenoa repens, (SAW PALMETTO PO), Take by mouth., Disp: , Rfl:   Review of Systems  Constitutional: Negative for appetite change, chills, diaphoresis, fatigue, fever and unexpected weight change.  HENT: Negative for congestion, dental problem, drooling, ear pain, facial swelling, hearing loss, mouth sores, sneezing, sore throat, trouble swallowing and voice change.   Eyes: Negative for pain, discharge, redness, itching and visual disturbance.  Respiratory: Negative for cough, choking, shortness of breath and wheezing.   Cardiovascular: Negative for chest pain, palpitations and leg swelling.  Gastrointestinal: Negative for abdominal pain, blood in stool, constipation, diarrhea and vomiting.  Endocrine: Negative for cold intolerance, heat intolerance and polydipsia.  Genitourinary: Negative for decreased urine volume, dysuria and hematuria.  Musculoskeletal: Negative for arthralgias, back pain and gait problem.  Skin: Negative for rash.  Allergic/Immunologic: Negative for environmental allergies.  Neurological: Negative for seizures, syncope, light-headedness and headaches.  Hematological: Negative for adenopathy.  Psychiatric/Behavioral: Negative for agitation, dysphoric mood and suicidal ideas. The patient is nervous/anxious.     Per HPI unless specifically indicated above     Objective:    BP 113/69 (  BP Location: Right Arm, Patient Position: Sitting, Cuff Size: Normal)   Pulse 75   Temp 98.1 F (36.7 C)   Ht 5\' 10"  (1.778 m)   Wt 187 lb 12 oz (85.2 kg)   SpO2 98%   BMI 26.94 kg/m   Wt Readings from Last 3 Encounters:  09/02/18 187 lb 12 oz (85.2 kg)  04/13/18 197 lb 4 oz (89.5 kg)  01/12/18 191 lb (86.6 kg)    Physical Exam  Constitutional: He is oriented to person, place, and time. He appears well-developed and well-nourished.  HENT:  Head:  Normocephalic and atraumatic.  Neck: Neck supple.  Cardiovascular: Normal rate and regular rhythm.  Pulmonary/Chest: Effort normal and breath sounds normal. He has no wheezes.  Abdominal: Soft. Bowel sounds are normal. There is no hepatosplenomegaly. There is no tenderness.  Musculoskeletal: He exhibits no edema.  Lymphadenopathy:    He has no cervical adenopathy.  Neurological: He is alert and oriented to person, place, and time.  Skin: Skin is warm and dry.  Psychiatric: He has a normal mood and affect. His behavior is normal.  Vitals reviewed.   Results for orders placed or performed during the hospital encounter of 08/07/18  Lipid panel  Result Value Ref Range   Cholesterol 127 0 - 200 mg/dL   Triglycerides 133 <150 mg/dL   HDL 27 (L) >40 mg/dL   Total CHOL/HDL Ratio 4.7 RATIO   VLDL 27 0 - 40 mg/dL   LDL Cholesterol 73 0 - 99 mg/dL  Comprehensive metabolic panel  Result Value Ref Range   Sodium 141 135 - 145 mmol/L   Potassium 3.8 3.5 - 5.1 mmol/L   Chloride 109 98 - 111 mmol/L   CO2 25 22 - 32 mmol/L   Glucose, Bld 111 (H) 70 - 99 mg/dL   BUN 18 6 - 20 mg/dL   Creatinine, Ser 0.99 0.61 - 1.24 mg/dL   Calcium 9.3 8.9 - 10.3 mg/dL   Total Protein 7.5 6.5 - 8.1 g/dL   Albumin 4.4 3.5 - 5.0 g/dL   AST 23 15 - 41 U/L   ALT 32 0 - 44 U/L   Alkaline Phosphatase 57 38 - 126 U/L   Total Bilirubin 0.8 0.3 - 1.2 mg/dL   GFR calc non Af Amer >60 >60 mL/min   GFR calc Af Amer >60 >60 mL/min   Anion gap 7 5 - 15  PSA  Result Value Ref Range   Prostatic Specific Antigen 1.86 0.00 - 4.00 ng/mL      Assessment & Plan:   Encounter Diagnoses  Name Primary?  [name] Anxiety Yes  . Arteriosclerotic cardiovascular disease (ASCVD)   . Hyperlipidemia, unspecified hyperlipidemia type   . Special screening for malignant neoplasms, colon   . Insomnia, unspecified type      -pt can take Melatonin every night -counseled pt No drinking 2 hour before bed -counseled pt to Stop  caffeine. -counseled on Relaxation exercises and gave handout -rx paxil -pt was given iFOBT for colon cancer screening -pt to follow up 3 weeks to recheck anxiety.  RTO sooner prn

## 2018-09-21 LAB — IFOBT (OCCULT BLOOD): IFOBT: NEGATIVE

## 2018-09-23 ENCOUNTER — Ambulatory Visit: Payer: Self-pay | Admitting: Physician Assistant

## 2018-09-23 ENCOUNTER — Encounter: Payer: Self-pay | Admitting: Physician Assistant

## 2018-09-23 VITALS — BP 107/59 | HR 54 | Temp 98.1°F | Ht 70.0 in | Wt 193.0 lb

## 2018-09-23 DIAGNOSIS — I2510 Atherosclerotic heart disease of native coronary artery without angina pectoris: Secondary | ICD-10-CM

## 2018-09-23 DIAGNOSIS — F419 Anxiety disorder, unspecified: Secondary | ICD-10-CM

## 2018-09-23 DIAGNOSIS — E785 Hyperlipidemia, unspecified: Secondary | ICD-10-CM

## 2018-09-23 MED ORDER — PAROXETINE HCL 20 MG PO TABS: 20.0000 mg | ORAL_TABLET | Freq: Every day | ORAL | 2 refills | Status: DC

## 2018-09-23 NOTE — Progress Notes (Signed)
BP (!) 107/59 (BP Location: Right Arm, Patient Position: Sitting, Cuff Size: Normal)   Pulse (!) 54   Temp 98.1 F (36.7 C)   Ht 5\' 10"  (1.778 m)   Wt 193 lb (87.5 kg)   SpO2 99%   BMI 27.69 kg/m    Subjective:    Patient ID: Edward Fisher, male    DOB: 02-Jul-1966, 52 y.o.   MRN: [id number]  HPI: Edward Fisher is a 52 y.o. male presenting on 09/23/2018 for Anxiety   HPI   Pt states anxiety improved on paxil.  No negative side effects.   Relevant past medical, surgical, family and social history reviewed and updated as indicated. Interim medical history since our last visit reviewed. Allergies and medications reviewed and updated.   Current Outpatient Medications:  .  aspirin 81 MG chewable tablet, Chew 81 mg by mouth every evening. TO START DOSAGE IN 6 MONTHS APPROXIMATELY July 2014, Disp: , Rfl:  .  atorvastatin (LIPITOR) 20 MG tablet, Take 1 tablet (20 mg total) by mouth daily., Disp: 30 tablet, Rfl: 6 .  MELATONIN PO, Take by mouth., Disp: , Rfl:  .  nitroGLYCERIN (NITROSTAT) 0.4 MG SL tablet, Place 1 tablet (0.4 mg total) under the tongue every 5 (five) minutes as needed for chest pain., Disp: 25 tablet, Rfl: 3 .  omega-3 acid ethyl esters (LOVAZA) 1 G capsule, Take 1 g by mouth every evening., Disp: , Rfl:  .  PARoxetine (PAXIL) 20 MG tablet, Take 1 tablet (20 mg total) by mouth daily., Disp: 30 tablet, Rfl: 0   Review of Systems  Constitutional: Negative for appetite change, chills, diaphoresis, fatigue, fever and unexpected weight change.  HENT: Negative for congestion, dental problem, drooling, ear pain, facial swelling, hearing loss, mouth sores, sneezing, sore throat, trouble swallowing and voice change.   Eyes: Negative for pain, discharge, redness, itching and visual disturbance.  Respiratory: Negative for cough, choking, shortness of breath and wheezing.   Cardiovascular: Negative for chest pain, palpitations and leg swelling.  Gastrointestinal: Negative for  abdominal pain, blood in stool, constipation, diarrhea and vomiting.  Endocrine: Negative for cold intolerance, heat intolerance and polydipsia.  Genitourinary: Negative for decreased urine volume, dysuria and hematuria.  Musculoskeletal: Negative for arthralgias, back pain and gait problem.  Skin: Negative for rash.  Allergic/Immunologic: Negative for environmental allergies.  Neurological: Negative for seizures, syncope, light-headedness and headaches.  Hematological: Negative for adenopathy.  Psychiatric/Behavioral: Negative for agitation, dysphoric mood and suicidal ideas. The patient is not nervous/anxious.     Per HPI unless specifically indicated above     Objective:    BP (!) 107/59 (BP Location: Right Arm, Patient Position: Sitting, Cuff Size: Normal)   Pulse (!) 54   Temp 98.1 F (36.7 C)   Ht 5\' 10"  (1.778 m)   Wt 193 lb (87.5 kg)   SpO2 99%   BMI 27.69 kg/m   Wt Readings from Last 3 Encounters:  09/23/18 193 lb (87.5 kg)  09/02/18 187 lb 12 oz (85.2 kg)  04/13/18 197 lb 4 oz (89.5 kg)    Physical Exam  Constitutional: He is oriented to person, place, and time. He appears well-developed and well-nourished.  HENT:  Head: Normocephalic and atraumatic.  Neck: Neck supple.  Cardiovascular: Normal rate and regular rhythm.  Pulmonary/Chest: Effort normal and breath sounds normal. He has no wheezes.  Abdominal: Soft. Bowel sounds are normal. There is no hepatosplenomegaly. There is no tenderness.  Musculoskeletal: He exhibits no edema.  Lymphadenopathy:    He has no cervical adenopathy.  Neurological: He is alert and oriented to person, place, and time.  Skin: Skin is warm and dry.  Psychiatric: He has a normal mood and affect. His behavior is normal.  Vitals reviewed.   Results for orders placed or performed in visit on 09/02/18  IFOBT POC (occult bld, rslt in office)  Result Value Ref Range   IFOBT Negative       Assessment & Plan:   Encounter Diagnoses   Name Primary?  [name] Anxiety Yes  . Arteriosclerotic cardiovascular disease (ASCVD)   . Hyperlipidemia, unspecified hyperlipidemia type     -reviewed iFOBT results with pt -pt to continue paxil for anxiety -pt to follow up 2 months.  RTO sooner prn

## 2018-11-16 ENCOUNTER — Other Ambulatory Visit (HOSPITAL_COMMUNITY)
Admission: RE | Admit: 2018-11-16 | Discharge: 2018-11-16 | Disposition: A | Discharge status: Home / Self Care | Payer: Self-pay | Source: Ambulatory Visit | Attending: Physician Assistant | Admitting: Physician Assistant

## 2018-11-16 DIAGNOSIS — I2510 Atherosclerotic heart disease of native coronary artery without angina pectoris: Secondary | ICD-10-CM | POA: Insufficient documentation

## 2018-11-16 DIAGNOSIS — E785 Hyperlipidemia, unspecified: Secondary | ICD-10-CM | POA: Insufficient documentation

## 2018-11-16 LAB — COMPREHENSIVE METABOLIC PANEL
ALK PHOS: 53 U/L (ref 38–126)
ALT: 27 U/L (ref 0–44)
ANION GAP: 6 (ref 5–15)
AST: 19 U/L (ref 15–41)
Albumin: 4.6 g/dL (ref 3.5–5.0)
BILIRUBIN TOTAL: 0.8 mg/dL (ref 0.3–1.2)
BUN: 16 mg/dL (ref 6–20)
CALCIUM: 9.4 mg/dL (ref 8.9–10.3)
CO2: 24 mmol/L (ref 22–32)
Chloride: 109 mmol/L (ref 98–111)
Creatinine, Ser: 0.87 mg/dL (ref 0.61–1.24)
Glucose, Bld: 116 mg/dL — ABNORMAL HIGH (ref 70–99)
Potassium: 4.1 mmol/L (ref 3.5–5.1)
Sodium: 139 mmol/L (ref 135–145)
TOTAL PROTEIN: 7.6 g/dL (ref 6.5–8.1)

## 2018-11-16 LAB — LIPID PANEL
CHOL/HDL RATIO: 3.5 ratio
CHOLESTEROL: 116 mg/dL (ref 0–200)
HDL: 33 mg/dL — AB (ref >40)
LDL Cholesterol: 72 mg/dL (ref 0–99)
Triglycerides: 57 mg/dL (ref <150)
VLDL: 11 mg/dL (ref 0–40)

## 2018-11-23 ENCOUNTER — Encounter: Payer: Self-pay | Admitting: Physician Assistant

## 2018-11-23 ENCOUNTER — Ambulatory Visit: Payer: Self-pay | Admitting: Physician Assistant

## 2018-11-23 VITALS — BP 110/78 | HR 87 | Temp 98.1°F | Ht 70.0 in | Wt 202.5 lb

## 2018-11-23 DIAGNOSIS — I10 Essential (primary) hypertension: Secondary | ICD-10-CM

## 2018-11-23 DIAGNOSIS — I2510 Atherosclerotic heart disease of native coronary artery without angina pectoris: Secondary | ICD-10-CM

## 2018-11-23 DIAGNOSIS — E785 Hyperlipidemia, unspecified: Secondary | ICD-10-CM

## 2018-11-23 DIAGNOSIS — F419 Anxiety disorder, unspecified: Secondary | ICD-10-CM

## 2018-11-23 DIAGNOSIS — Z131 Encounter for screening for diabetes mellitus: Secondary | ICD-10-CM

## 2018-11-23 NOTE — Progress Notes (Signed)
BP 110/78 (BP Location: Right Arm, Patient Position: Sitting, Cuff Size: Normal)   Pulse 87   Temp 98.1 F (36.7 C)   Ht 5\' 10"  (1.778 m)   Wt 202 lb 8 oz (91.9 kg)   SpO2 98%   BMI 29.06 kg/m    Subjective:    Patient ID: Edward Fisher, male    DOB: 30-Oct-1966, 53 y.o.   MRN: [id number]  HPI: AHMET SCHANK is a 53 y.o. male presenting on 11/23/2018 for Mental Health Problem and Coronary Artery Disease   HPI  Pt is doing well.  He has hx CABG in 2013 but has not had any CP or SOB.    He continues to work Firefighter.   Relevant past medical, surgical, family and social history reviewed and updated as indicated. Interim medical history since our last visit reviewed. Allergies and medications reviewed and updated.   Current Outpatient Medications:  .  aspirin 81 MG chewable tablet, Chew 81 mg by mouth every evening. TO START DOSAGE IN 6 MONTHS APPROXIMATELY July 2014, Disp: , Rfl:  .  atorvastatin (LIPITOR) 20 MG tablet, Take 1 tablet (20 mg total) by mouth daily., Disp: 30 tablet, Rfl: 6 .  MELATONIN PO, Take by mouth., Disp: , Rfl:  .  nitroGLYCERIN (NITROSTAT) 0.4 MG SL tablet, Place 1 tablet (0.4 mg total) under the tongue every 5 (five) minutes as needed for chest pain., Disp: 25 tablet, Rfl: 3 .  omega-3 acid ethyl esters (LOVAZA) 1 G capsule, Take 1 g by mouth every evening., Disp: , Rfl:  .  PARoxetine (PAXIL) 20 MG tablet, Take 1 tablet (20 mg total) by mouth daily., Disp: 30 tablet, Rfl: 2    Review of Systems  Constitutional: Negative for appetite change, chills, diaphoresis, fatigue, fever and unexpected weight change.  HENT: Negative for congestion, dental problem, drooling, ear pain, facial swelling, hearing loss, mouth sores, sneezing, sore throat, trouble swallowing and voice change.   Eyes: Negative for pain, discharge, redness, itching and visual disturbance.  Respiratory: Negative for cough, choking, shortness of breath and wheezing.    Cardiovascular: Negative for chest pain, palpitations and leg swelling.  Gastrointestinal: Negative for abdominal pain, blood in stool, constipation, diarrhea and vomiting.  Endocrine: Negative for cold intolerance, heat intolerance and polydipsia.  Genitourinary: Negative for decreased urine volume, dysuria and hematuria.  Musculoskeletal: Negative for arthralgias, back pain and gait problem.  Skin: Negative for rash.  Allergic/Immunologic: Negative for environmental allergies.  Neurological: Negative for seizures, syncope, light-headedness and headaches.  Hematological: Negative for adenopathy.  Psychiatric/Behavioral: Negative for agitation, dysphoric mood and suicidal ideas. The patient is not nervous/anxious.     Per HPI unless specifically indicated above     Objective:    BP 110/78 (BP Location: Right Arm, Patient Position: Sitting, Cuff Size: Normal)   Pulse 87   Temp 98.1 F (36.7 C)   Ht 5\' 10"  (1.778 m)   Wt 202 lb 8 oz (91.9 kg)   SpO2 98%   BMI 29.06 kg/m   Wt Readings from Last 3 Encounters:  11/23/18 202 lb 8 oz (91.9 kg)  09/23/18 193 lb (87.5 kg)  09/02/18 187 lb 12 oz (85.2 kg)    Physical Exam Vitals signs reviewed.  Constitutional:      Appearance: He is well-developed.  HENT:     Head: Normocephalic and atraumatic.  Neck:     Musculoskeletal: Neck supple.  Cardiovascular:     Rate and Rhythm: Normal  rate and regular rhythm.  Pulmonary:     Effort: Pulmonary effort is normal.     Breath sounds: Normal breath sounds. No wheezing.  Abdominal:     General: Bowel sounds are normal.     Palpations: Abdomen is soft.     Tenderness: There is no abdominal tenderness.  Lymphadenopathy:     Cervical: No cervical adenopathy.  Skin:    General: Skin is warm and dry.  Neurological:     Mental Status: He is alert and oriented to person, place, and time.  Psychiatric:        Behavior: Behavior normal.     Results for orders placed or performed during  the hospital encounter of 11/16/18  Lipid panel  Result Value Ref Range   Cholesterol 116 0 - 200 mg/dL   Triglycerides 57 <150 mg/dL   HDL 33 (L) >40 mg/dL   Total CHOL/HDL Ratio 3.5 RATIO   VLDL 11 0 - 40 mg/dL   LDL Cholesterol 72 0 - 99 mg/dL  Comprehensive metabolic panel  Result Value Ref Range   Sodium 139 135 - 145 mmol/L   Potassium 4.1 3.5 - 5.1 mmol/L   Chloride 109 98 - 111 mmol/L   CO2 24 22 - 32 mmol/L   Glucose, Bld 116 (H) 70 - 99 mg/dL   BUN 16 6 - 20 mg/dL   Creatinine, Ser 0.87 0.61 - 1.24 mg/dL   Calcium 9.4 8.9 - 10.3 mg/dL   Total Protein 7.6 6.5 - 8.1 g/dL   Albumin 4.6 3.5 - 5.0 g/dL   AST 19 15 - 41 U/L   ALT 27 0 - 44 U/L   Alkaline Phosphatase 53 38 - 126 U/L   Total Bilirubin 0.8 0.3 - 1.2 mg/dL   GFR calc non Af Amer >60 >60 mL/min   GFR calc Af Amer >60 >60 mL/min   Anion gap 6 5 - 15      Assessment & Plan:   Encounter Diagnoses  Name Primary?  [name] Arteriosclerotic cardiovascular disease (ASCVD) Yes  . Hyperlipidemia, unspecified hyperlipidemia type   . Essential hypertension   . Anxiety    -reviewed labs with pt -pt counseled on healthy lifestyle including maintaining a normal weight with nutritious diet and regular exercise -pt to continue current medications  -pt to follow up 4 months.  RTO sooner prn

## 2019-01-04 ENCOUNTER — Other Ambulatory Visit: Payer: Self-pay | Admitting: Physician Assistant

## 2019-01-26 ENCOUNTER — Other Ambulatory Visit: Payer: Self-pay | Admitting: Physician Assistant

## 2019-02-15 ENCOUNTER — Ambulatory Visit: Payer: Self-pay | Admitting: Physician Assistant

## 2019-02-15 ENCOUNTER — Other Ambulatory Visit: Payer: Self-pay

## 2019-02-15 ENCOUNTER — Encounter: Payer: Self-pay | Admitting: Physician Assistant

## 2019-02-15 VITALS — Temp 98.1°F

## 2019-02-15 DIAGNOSIS — L02212 Cutaneous abscess of back [any part, except buttock]: Secondary | ICD-10-CM

## 2019-02-15 MED ORDER — SULFAMETHOXAZOLE-TRIMETHOPRIM 800-160 MG PO TABS: 1.0000 | ORAL_TABLET | Freq: Two times a day (BID) | ORAL | 0 refills | Status: DC

## 2019-02-15 NOTE — Progress Notes (Signed)
Temp 98.1 F (36.7 C)    Subjective:    Patient ID: Edward Fisher, male    DOB: February 17, 1966, 53 y.o.   MRN: [id number]  HPI: Edward Fisher is a 53 y.o. male presenting on 02/15/2019 for Cyst (on back. started early last week. pt states it has been draining and painful)   HPI  Encounter Diagnosis  Name Primary?  . Cutaneous abscess of back excluding buttocks Yes    Pt with long history of sebaceous cyst on back.  He says he bumped it several days to a week before it started getting red and painful.    Pt denies fever.    Relevant past medical, surgical, family and social history reviewed and updated as indicated. Interim medical history since our last visit reviewed. Allergies and medications reviewed and updated.   Current Outpatient Medications:  .  aspirin 81 MG chewable tablet, Chew 81 mg by mouth every evening. TO START DOSAGE IN 6 MONTHS APPROXIMATELY July 2014, Disp: , Rfl:  .  atorvastatin (LIPITOR) 20 MG tablet, Take 1 tablet by mouth once daily, Disp: 30 tablet, Rfl: 6 .  MELATONIN PO, Take 1 tablet by mouth at bedtime as needed. , Disp: , Rfl:  .  nitroGLYCERIN (NITROSTAT) 0.4 MG SL tablet, Place 1 tablet (0.4 mg total) under the tongue every 5 (five) minutes as needed for chest pain., Disp: 25 tablet, Rfl: 3 .  omega-3 acid ethyl esters (LOVAZA) 1 G capsule, Take 1 g by mouth every evening., Disp: , Rfl:  .  PARoxetine (PAXIL) 20 MG tablet, Take 1 tablet by mouth once daily, Disp: 30 tablet, Rfl: 3   Review of Systems  Per HPI unless specifically indicated above     Objective:    Temp 98.1 F (36.7 C)   Wt Readings from Last 3 Encounters:  11/23/18 202 lb 8 oz (91.9 kg)  09/23/18 193 lb (87.5 kg)  09/02/18 187 lb 12 oz (85.2 kg)    Physical Exam Vitals signs reviewed.  Constitutional:      Appearance: Normal appearance.  HENT:     Head: Normocephalic and atraumatic.  Pulmonary:     Effort: Pulmonary effort is normal. No respiratory distress.  Skin:   Findings: Abscess present.          Comments: Approximately 3 inch diameter abscess on back that is red, tender and oozing pus from multiple pores.    Neurological:     Mental Status: He is alert and oriented to person, place, and time.  Psychiatric:        Mood and Affect: Mood normal.        Behavior: Behavior normal.     Discussed risks and benefits of I&D which pt has had in the past and pt would like to proceed.  Verbal consent obtained.    Area was draped, skin was cleaned, instilled 1% lidocaine, abscess opened with #11 blade.  Copious foul smelling pus and sebum expressed.  Broke up loculations with hemostats.  Abscess packed with plain 1/4in gauze.  Dry sterile dressing applied.  Pt tolerated procedure well and ambulated from room without assistance.        Assessment & Plan:    Encounter Diagnosis  Name Primary?  . Cutaneous abscess of back excluding buttocks Yes     -rx septra ds. -pt to change dressing daily -pt to remove packing (his wife will do it) on Wednesday morning (48 hr) -he is to use mild heat on  the area 10-20 minutes several times daily -pt is to follow up as scheduled.  Pt to contact office sooner if needed

## 2019-02-15 NOTE — Patient Instructions (Signed)
Skin Abscess  A skin abscess is an infected area on or under your skin that contains a collection of pus and other material. An abscess may also be called a furuncle, carbuncle, or boil. An abscess can occur in or on almost any part of your body. Some abscesses break open (rupture) on their own. Most continue to get worse unless they are treated. The infection can spread deeper into the body and eventually into your blood, which can make you feel ill. Treatment usually involves draining the abscess. What are the causes? An abscess occurs when germs, like bacteria, pass through your skin and cause an infection. This may be caused by:  A scrape or cut on your skin.  A puncture wound through your skin, including a needle injection or insect bite.  Blocked oil or sweat glands.  Blocked and infected hair follicles.  A cyst that forms beneath your skin (sebaceous cyst) and becomes infected. What increases the risk? This condition is more likely to develop in people who:  Have a weak body defense system (immune system).  Have diabetes.  Have dry and irritated skin.  Get frequent injections or use illegal IV drugs.  Have a foreign body in a wound, such as a splinter.  Have problems with their lymph system or veins. What are the signs or symptoms? Symptoms of this condition include:  A painful, firm bump under the skin.  A bump with pus at the top. This may break through the skin and drain. Other symptoms include:  Redness surrounding the abscess site.  Warmth.  Swelling of the lymph nodes (glands) near the abscess.  Tenderness.  A sore on the skin. How is this diagnosed? This condition may be diagnosed based on:  A physical exam.  Your medical history.  A sample of pus. This may be used to find out what is causing the infection.  Blood tests.  Imaging tests, such as an ultrasound, CT scan, or MRI. How is this treated? A small abscess that drains on its own may not  need treatment. Treatment for larger abscesses may include:  Moist heat or heat pack applied to the area several times a day.  A procedure to drain the abscess (incision and drainage).  Antibiotic medicines. For a severe abscess, you may first get antibiotics through an IV and then change to antibiotics by mouth. Follow these instructions at home: Medicines   Take over-the-counter and prescription medicines only as told by your health care provider.  If you were prescribed an antibiotic medicine, take it as told by your health care provider. Do not stop taking the antibiotic even if you start to feel better. Abscess care   If you have an abscess that has not drained, apply heat to the affected area. Use the heat source that your health care provider recommends, such as a moist heat pack or a heating pad. ? Place a towel between your skin and the heat source. ? Leave the heat on for 20-30 minutes. ? Remove the heat if your skin turns bright red. This is especially important if you are unable to feel pain, heat, or cold. You may have a greater risk of getting burned.  Follow instructions from your health care provider about how to take care of your abscess. Make sure you: ? Cover the abscess with a bandage (dressing). ? Change your dressing or gauze as told by your health care provider. ? Wash your hands with soap and water before you change the   dressing or gauze. If soap and water are not available, use hand sanitizer.  Check your abscess every day for signs of a worsening infection. Check for: ? More redness, swelling, or pain. ? More fluid or blood. ? Warmth. ? More pus or a bad smell. General instructions  To avoid spreading the infection: ? Do not share personal care items, towels, or hot tubs with others. ? Avoid making skin contact with other people.  Keep all follow-up visits as told by your health care provider. This is important. Contact a health care provider if you  have:  More redness, swelling, or pain around your abscess.  More fluid or blood coming from your abscess.  Warm skin around your abscess.  More pus or a bad smell coming from your abscess.  A fever.  Muscle aches.  Chills or a general ill feeling. Get help right away if you:  Have severe pain.  See red streaks on your skin spreading away from the abscess. Summary  A skin abscess is an infected area on or under your skin that contains a collection of pus and other material.  A small abscess that drains on its own may not need treatment.  Treatment for larger abscesses may include having a procedure to drain the abscess and taking an antibiotic. This information is not intended to replace advice given to you by your health care provider. Make sure you discuss any questions you have with your health care provider. Document Released: 07/31/2005 Document Revised: 12/04/2017 Document Reviewed: 12/04/2017 Elsevier Interactive Patient Education  2019 [employer]  

## 2019-03-24 ENCOUNTER — Ambulatory Visit: Payer: Self-pay | Admitting: Physician Assistant

## 2019-03-24 ENCOUNTER — Encounter: Payer: Self-pay | Admitting: Physician Assistant

## 2019-03-24 DIAGNOSIS — E785 Hyperlipidemia, unspecified: Secondary | ICD-10-CM

## 2019-03-24 DIAGNOSIS — I2510 Atherosclerotic heart disease of native coronary artery without angina pectoris: Secondary | ICD-10-CM

## 2019-03-24 DIAGNOSIS — F419 Anxiety disorder, unspecified: Secondary | ICD-10-CM

## 2019-03-24 NOTE — Progress Notes (Signed)
There were no vitals taken for this visit.   Subjective:    Patient ID: Edward Fisher, male    DOB: 1965/11/06, 53 y.o.   MRN: [id number]  HPI: [name] is a 52 y.o. male presenting on 03/24/2019 for No chief complaint on file.   HPI    This is a telemedicine appointment due to coronavirus pandemic.  It is a telephone visit as pt is unable to connect for video appointment due to conection issues  I connected with  Edward Fisher on 03/24/19 by a video enabled telemedicine application and verified that I am speaking with the correct person using two identifiers.   I discussed the limitations of evaluation and management by telemedicine. The patient expressed understanding and agreed to proceed.   Pt is at work at a site in [place].  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   He is not having and chest pains, no sob, no fevers or cough.   His sebaceous cyst on his back taht he was seen for in April cleared up.  Pt says he is not wearing a face covering when he goes to the store.  He says his mood is good and he is not having any anxiety and he is sleeping well.   Relevant past medical, surgical, family and social history reviewed and updated as indicated. Interim medical history since our last visit reviewed. Allergies and medications reviewed and updated.   Current Outpatient Medications:  .  aspirin 81 MG chewable tablet, Chew 81 mg by mouth every evening. TO START DOSAGE IN 6 MONTHS APPROXIMATELY July 2014, Disp: , Rfl:  .  atorvastatin (LIPITOR) 20 MG tablet, Take 1 tablet by mouth once daily, Disp: 30 tablet, Rfl: 6 .  omega-3 acid ethyl esters (LOVAZA) 1 G capsule, Take 1 g by mouth every evening., Disp: , Rfl:  .  PARoxetine (PAXIL) 20 MG tablet, Take 1 tablet by mouth once daily, Disp: 30 tablet, Rfl: 3 .  nitroGLYCERIN (NITROSTAT) 0.4 MG SL tablet, Place 1 tablet (0.4 mg total) under the tongue every 5 (five) minutes as needed for chest pain. (Patient  not taking: Reported on 03/24/2019), Disp: 25 tablet, Rfl: 3     Review of Systems  Per HPI unless specifically indicated above     Objective:    There were no vitals taken for this visit.  Wt Readings from Last 3 Encounters:  11/23/18 202 lb 8 oz (91.9 kg)  09/23/18 193 lb (87.5 kg)  09/02/18 187 lb 12 oz (85.2 kg)    Physical Exam Pulmonary:     Effort: Pulmonary effort is normal. No respiratory distress.  Neurological:     Mental Status: He is alert and oriented to person, place, and time.  Psychiatric:        Attention and Perception: Attention normal.        Mood and Affect: Mood normal.        Speech: Speech normal.        Behavior: Behavior is cooperative.          Assessment & Plan:   Encounter Diagnoses  Name Primary?  [name] Arteriosclerotic cardiovascular disease (ASCVD) Yes  . Hyperlipidemia, unspecified hyperlipidemia type   . Anxiety      -Defer labs at this time -pt to continue current medications -encouraged pt to wear face covering or mask when he goes to the store to help with prevention of CV19 -pt to follow up  in 4 months.  He is to contact office sooner prn

## 2019-05-31 ENCOUNTER — Telehealth: Payer: Self-pay | Admitting: Cardiovascular Disease

## 2019-05-31 NOTE — Telephone Encounter (Signed)
Patient made appt, complaining of SOB wanted to know if something could be done until he is able to be seen

## 2019-05-31 NOTE — Telephone Encounter (Signed)
Last seen 2018.  SOB - thinks may be related to weight gain & does notice more with activity.  Just wants to be checked out to be sure nothing else going on.  No fever, cough, or covid exposure.  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  He will keep OV as scheduled for now.

## 2019-06-03 ENCOUNTER — Other Ambulatory Visit: Payer: Self-pay | Admitting: Physician Assistant

## 2019-06-03 MED ORDER — PAROXETINE HCL 20 MG PO TABS: 20.0000 mg | ORAL_TABLET | Freq: Every day | ORAL | 3 refills | Status: DC

## 2019-06-16 NOTE — Progress Notes (Signed)
Cardiology Office Note   Date:  06/17/2019   ID:  [name], [name] 1966-09-30, MRN [id number]  PCP:  Soyla Dryer, PA-C Cardiologist:  Kate Sable, MD 05/04/2017 Rosaria Ferries, PA-C   No chief complaint on file.   History of Present Illness: Edward Fisher is a 53 y.o. male with a history of CABG 2013 w/ LIMA-LAD,  L Rad-CFX, SVG-dRCA-PDA, HLD, nephrolithiasis  [name] presents for cardiology follow up  He does not exercise because he is so tired from working. His job is very physical, lifting countertops and cabinets. He has not had chest pain during or directly after this type of exertion. However, the fatigue is worse than usual.  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  He has had some fatigue that is unusual for him. He wonders if it is from the hot weather. also wonders if it is from weight gain. Has been SOB at times, with exertion, wonders if that is from weight gain. However, he also is concerned because DOE was his main symptom with CAD.   Gets a little LE edema during the day, does not wake with it. No orthopnea or PND.   Has problems with restlessness during sleep. Occ takes melatonin.  Wife has had a liver transplant, he is careful what OTC meds he takes.   PCP follows his lipids.   No palpitations. No presyncope or syncope.    Past Medical History:  Diagnosis Date  . Arteriosclerotic cardiovascular disease (ASCVD)    Onset in 04/2012; CABG in 06/2012 w/ LIMA-LAD, L Rad-CFX, SVG-PL-PDA  . Hyperlipidemia    goal LDL < 70  . Insomnia   . Nephrolithiasis     Past Surgical History:  Procedure Laterality Date  . CORONARY ARTERY BYPASS GRAFT  06/24/2012   Procedure: CORONARY ARTERY BYPASS GRAFTING (CABG);  Surgeon: [name], MD;  Location: [place];  Service: Open Heart Surgery;  Laterality: N/A;  Coronary Artery  Bypass X 4 utilizing endoscopic saphenous vein graft and   Left radial Artery Harvest   . CYSTOSCOPY W/ RETROGRADES Left 08/23/2015   Procedure: CYSTOSCOPY WITH RETROGRADE PYELOGRAM;  Surgeon: [name], MD;  Location: [facility];  Service: Urology;  Laterality: Left;  . CYSTOSCOPY WITH URETEROSCOPY, STONE BASKETRY AND STENT PLACEMENT Left 08/23/2015   Procedure: CYSTOSCOPY WITH URETEROSCOPY, STONE BASKET EXTRACTION;  Surgeon: [name], MD;  Location: [facility];  Service: Urology;  Laterality: Left;  . HOLMIUM LASER APPLICATION Left 58/52/7782   Procedure: HOLMIUM LASER APPLICATION;  Surgeon: [name], MD;  Location: [facility];  Service: Urology;  Laterality: Left;  . LEFT HEART CATHETERIZATION WITH CORONARY ANGIOGRAM N/A 06/23/2012   Procedure: LEFT HEART CATHETERIZATION WITH CORONARY ANGIOGRAM;  Surgeon: [name], MD;  Location: [facility];  Service: Cardiovascular;  Laterality: N/A;  . RHINOPLASTY    . TONSILLECTOMY  ~1971    Current Outpatient Medications  Medication Sig Dispense Refill  . aspirin 81 MG chewable tablet Chew 81 mg by mouth every evening. TO START DOSAGE IN 6 MONTHS APPROXIMATELY July 2014    . atorvastatin (LIPITOR) 20 MG tablet Take 1 tablet by mouth once daily 30 tablet 6  . nitroGLYCERIN (NITROSTAT) 0.4 MG SL tablet Place 1 tablet (0.4 mg total) under the tongue every 5 (five) minutes as needed for chest pain. 25 tablet 3  . omega-3 acid ethyl esters (LOVAZA) 1 G  capsule Take 1 g by mouth every evening.    [name] PARoxetine (PAXIL) 20 MG tablet Take 1 tablet (20 mg total) by mouth daily. 30 tablet 3   No current facility-administered medications for this visit.     Allergies:   Pollen extract    Social History:  The patient  reports that he has never smoked. He quit smokeless tobacco use about 20 years ago.  His smokeless tobacco use included chew. He reports that he does not drink alcohol or use drugs.   Family History:  The patient's family history includes Coronary artery disease in his brother and  father; Hypertension in his mother; Ulcers in his father.  He indicated that his mother is alive. He indicated that his father is alive. He indicated that the status of his brother is unknown.    ROS:  Please see the history of present illness. All other systems are reviewed and negative.    PHYSICAL EXAM: VS:  BP (!) 149/89   Pulse 72   Ht 5\' 10"  (1.778 m)   Wt 220 lb (99.8 kg)   SpO2 98% Comment: on room air  BMI 31.57 kg/m  , BMI Body mass index is 31.57 kg/m. GEN: Well nourished, well developed, male in no acute distress HEENT: normal for age  Neck: no JVD, no carotid bruit, no masses Cardiac: RRR; no murmur, no rubs, or gallops Respiratory:  clear to auscultation bilaterally, normal work of breathing GI: soft, nontender, nondistended, + BS MS: no deformity or atrophy; no edema; distal pulses are 2+ in all 4 extremities  Skin: warm and dry, no rash, small varicose veins seen Neuro:  Strength and sensation are intact Psych: euthymic mood, full affect   EKG:  EKG is ordered today. The ekg ordered today demonstrates SR, HR 76, no acute ischemic changes, no change from 2018   CATH: 06/23/2012 Left mainstem:  The left mainstem is patent. There is mild nonobstructive plaque in the distal left main and I would estimate at 20-30%.  Left anterior descending (LAD): The proximal LAD has diffuse irregularity, but tapers with 50% stenosis leading into the first diagonal branch. The first diagonal branch covers a large territory and has no significant obstructive disease. The LAD just beyond the first diagonal has critical stenosis with 95-99% disease followed by severe diffuse disease throughout the midportion of the vessel. The distal vessel fills competitively from antegrade flow and collateral flow supplied by the terminal portion of the RCA.  Left circumflex (LCx): The AV groove circumflex is patent throughout the proximal aspect. It supplies a large atrial branch. The vessel gives  off one large obtuse marginal branch that has a 95% stenosis in its proximal aspect. The mid and distal portions of the OM are widely patent.  Right coronary artery (RCA): The RCA is a large, dominant vessel. There is moderate calcification throughout the course of the right coronary artery. There is mild diffuse nonobstructive plaque throughout the vessel. There is a focal area of moderately severe stenosis in the midportion of the vessel but I would estimate at 80%. Distally, the vessel divides into a PDA branch and 2 posterolateral branches. The PDA branch is large throughout its proximal segment. The midportion of the PDA has a tight 95% stenosis and then the terminal portion of the PDA is free of significant disease. The first posterolateral branch is large in caliber and has 50% stenosis in its proximal aspect. There is no high-grade stenosis throughout that PL branch. The second  posterolateral was small.  Left ventriculography: Left ventricular systolic function is normal, LVEF is estimated at 55-65%, there is no significant mitral regurgitation   Final Conclusions:   1. Severe three-vessel coronary artery disease as outlined 2. Normal LV function  Recommendations: The patient has severe multivessel coronary artery disease at a young age. He has classic symptoms of unstable angina and requires revascularization while he is here in the hospital to prevent progressive ischemia and infarction. I think he would be best treated with coronary bypass surgery, primarily because of the diffuse nature of his LAD stenosis. Considering his young age, hybrid revascularization would be a reasonable option, especially if his PDA was not graftable beyond the area of tight stenosis in its midportion. I will discuss with the surgical team and I appreciate their consultation. The findings were discussed with the patient and his wife in depth.   MYOVIEW: 06/22/2014 IMPRESSION: 1.  Negative stress MPI for  ischemia  2.  Negative stress EKG for ischemia  3.  Normal LV systolic function, LVEF 40%  4.  Low risk Duke treadmill score of 10  5. Very good exercise capacity (120% of predicted based on age and gender)    Recent Labs: 11/16/2018: ALT 27; BUN 16; Creatinine, Ser 0.87; Potassium 4.1; Sodium 139  CBC    Component Value Date/Time   WBC 7.7 08/22/2015 1300   RBC 4.81 08/22/2015 1300   HGB 14.6 08/22/2015 1300   HCT 43.3 08/22/2015 1300   PLT 175 08/22/2015 1300   MCV 90.0 08/22/2015 1300   MCH 30.4 08/22/2015 1300   MCHC 33.7 08/22/2015 1300   RDW 12.0 08/22/2015 1300   LYMPHSABS 1.4 07/20/2015 0855   MONOABS 1.1 (H) 07/20/2015 0855   EOSABS 0.2 07/20/2015 0855   BASOSABS 0.0 07/20/2015 0855   CMP Latest Ref Rng & Units 11/16/2018 08/07/2018 04/09/2018  Glucose 70 - 99 mg/dL 116(H) 111(H) 122(H)  BUN 6 - 20 mg/dL 16 18 16   Creatinine 0.61 - 1.24 mg/dL 0.87 0.99 0.99  Sodium 135 - 145 mmol/L 139 141 141  Potassium 3.5 - 5.1 mmol/L 4.1 3.8 4.2  Chloride 98 - 111 mmol/L 109 109 108  CO2 22 - 32 mmol/L 24 25 28   Calcium 8.9 - 10.3 mg/dL 9.4 9.3 9.5  Total Protein 6.5 - 8.1 g/dL 7.6 7.5 7.3  Total Bilirubin 0.3 - 1.2 mg/dL 0.8 0.8 0.7  Alkaline Phos 38 - 126 U/L 53 57 58  AST 15 - 41 U/L 19 23 22   ALT 0 - 44 U/L 27 32 32     Lipid Panel Lab Results  Component Value Date   CHOL 116 11/16/2018   HDL 33 (L) 11/16/2018   LDLCALC 72 11/16/2018   TRIG 57 11/16/2018   CHOLHDL 3.5 11/16/2018      Wt Readings from Last 3 Encounters:  06/17/19 220 lb (99.8 kg)  11/23/18 202 lb 8 oz (91.9 kg)  09/23/18 193 lb (87.5 kg)     Other studies Reviewed: Additional studies/ records that were reviewed today include: office notes, hospital records and testing.  ASSESSMENT AND PLAN:  1.  CAD: no clearly ischemic sx, but his pre-CABG sx were DOE - no ischemic eval since 2015 - will schedule GXT MV, Lexi if cannot do treadmill - f/u on results - continue ASA, Ntg prn,  statin -His blood pressure is elevated today, but he says that is very unusual for him.  He is encouraged to follow this. - If his blood  pressure is consistently elevated at the Myoview, add Toprol-XL 25 mg daily.  2.  Hyperlipidemia, goal LDL less than 70 - His lipids are followed by his PCP. -Advised him that his last LDL was 72, I encouraged him to eat better to try to reduce that so he would not have to take more medication. -He admits that he is gained 20 pounds in the last 6 months, just not watching what he is eating. - He is encouraged to try to eat healthier and reduce the amount of carbohydrates in the foods that he eats   Current medicines are reviewed at length with the patient today.  The patient does not have concerns regarding medicines.  The following changes have been made:  no change  Labs/ tests ordered today include:   Orders Placed This Encounter  Procedures  . NM Myocar Multi W/Spect W/Wall Motion / EF  . EKG 12-Lead     Disposition:   FU with Kate Sable, MD  Signed, Rosaria Ferries, PA-C  06/17/2019 9:04 AM    [place] Phone: [phone]; Fax: [phone]

## 2019-06-17 ENCOUNTER — Ambulatory Visit (INDEPENDENT_AMBULATORY_CARE_PROVIDER_SITE_OTHER): Payer: Self-pay | Admitting: Physician Assistant

## 2019-06-17 ENCOUNTER — Encounter: Payer: Self-pay | Admitting: *Deleted

## 2019-06-17 ENCOUNTER — Other Ambulatory Visit: Payer: Self-pay

## 2019-06-17 ENCOUNTER — Encounter: Payer: Self-pay | Admitting: Physician Assistant

## 2019-06-17 VITALS — BP 149/89 | HR 72 | Ht 70.0 in | Wt 220.0 lb

## 2019-06-17 DIAGNOSIS — R079 Chest pain, unspecified: Secondary | ICD-10-CM

## 2019-06-17 DIAGNOSIS — E785 Hyperlipidemia, unspecified: Secondary | ICD-10-CM

## 2019-06-17 DIAGNOSIS — I2510 Atherosclerotic heart disease of native coronary artery without angina pectoris: Secondary | ICD-10-CM

## 2019-06-17 DIAGNOSIS — R0609 Other forms of dyspnea: Secondary | ICD-10-CM

## 2019-06-17 NOTE — Patient Instructions (Addendum)
Medication Instructions:  Continue all current medications.  Labwork: none  Testing/Procedures:  Your physician has requested that you have en exercise stress myoview. For further information please visit [email or web address]. Please follow instruction sheet, as given.  Office will contact with results via phone or letter.    Follow-Up: Your physician wants you to follow up in:  1 year.  You will receive a reminder letter in the mail one-two months in advance.  If you don't receive a letter, please call our office to schedule the follow up appointment   Any Other Special Instructions Will Be Listed Below (If Applicable).  If you need a refill on your cardiac medications before your next appointment, please call your pharmacy.

## 2019-06-22 ENCOUNTER — Other Ambulatory Visit: Payer: Self-pay

## 2019-06-22 ENCOUNTER — Other Ambulatory Visit (HOSPITAL_COMMUNITY)
Admission: RE | Admit: 2019-06-22 | Discharge: 2019-06-22 | Disposition: A | Discharge status: Home / Self Care | Payer: Self-pay | Source: Ambulatory Visit | Attending: Physician Assistant | Admitting: Physician Assistant

## 2019-06-22 ENCOUNTER — Other Ambulatory Visit (HOSPITAL_COMMUNITY): Payer: Self-pay

## 2019-06-22 DIAGNOSIS — Z01818 Encounter for other preprocedural examination: Secondary | ICD-10-CM

## 2019-06-25 ENCOUNTER — Ambulatory Visit (HOSPITAL_COMMUNITY): Payer: HRSA Program

## 2019-06-25 ENCOUNTER — Ambulatory Visit (HOSPITAL_COMMUNITY): Admission: RE | Admit: 2019-06-25 | Payer: HRSA Program | Source: Ambulatory Visit

## 2019-06-25 ENCOUNTER — Telehealth: Payer: Self-pay | Admitting: *Deleted

## 2019-06-25 ENCOUNTER — Other Ambulatory Visit (HOSPITAL_COMMUNITY)
Admission: RE | Admit: 2019-06-25 | Discharge: 2019-06-25 | Disposition: A | Discharge status: Home / Self Care | Payer: HRSA Program | Source: Ambulatory Visit | Attending: Physician Assistant | Admitting: Physician Assistant

## 2019-06-25 ENCOUNTER — Other Ambulatory Visit: Payer: Self-pay

## 2019-06-25 DIAGNOSIS — Z20828 Contact with and (suspected) exposure to other viral communicable diseases: Secondary | ICD-10-CM | POA: Insufficient documentation

## 2019-06-25 DIAGNOSIS — Z01812 Encounter for preprocedural laboratory examination: Secondary | ICD-10-CM | POA: Insufficient documentation

## 2019-06-25 LAB — SARS CORONAVIRUS 2 (TAT 6-24 HRS): SARS Coronavirus 2: NEGATIVE

## 2019-06-25 NOTE — Telephone Encounter (Signed)
Pending stress test. Had covid testing today and wants to know how long he will have to stay out of work?

## 2019-06-28 ENCOUNTER — Telehealth: Payer: Self-pay | Admitting: *Deleted

## 2019-06-28 NOTE — Telephone Encounter (Signed)
Pt aware that COVID test appears to be negative - will forward to schedules to schedule exercise myoview

## 2019-06-28 NOTE — Telephone Encounter (Signed)
Patient called asking if Covid test results back and if so getting test scheduled

## 2019-06-28 NOTE — Telephone Encounter (Signed)
Pt aware and voiced understanding of instructions prior to stress test and to quarantine until he goes for stress test on Wednesday

## 2019-06-28 NOTE — Telephone Encounter (Signed)
-----   Message from Weston Anna sent at 06/28/2019  1:20 PM EDT ----- Schedule for Jun 30, 2019 arrive at [place] ----- Message ----- From: Massie Maroon, CMA Sent: 06/28/2019   1:03 PM EDT To: Weston Anna  Can you please schedule exercise myoview for this pt I can call him, thank you  [facility]

## 2019-06-30 ENCOUNTER — Ambulatory Visit (HOSPITAL_COMMUNITY)
Admission: RE | Admit: 2019-06-30 | Discharge: 2019-06-30 | Disposition: A | Discharge status: Home / Self Care | Payer: Self-pay | Source: Ambulatory Visit | Attending: Physician Assistant | Admitting: Physician Assistant

## 2019-06-30 ENCOUNTER — Telehealth: Payer: Self-pay | Admitting: Physician Assistant

## 2019-06-30 ENCOUNTER — Other Ambulatory Visit: Payer: Self-pay

## 2019-06-30 ENCOUNTER — Encounter (HOSPITAL_COMMUNITY)
Admission: RE | Admit: 2019-06-30 | Discharge: 2019-06-30 | Disposition: A | Discharge status: Home / Self Care | Payer: Self-pay | Source: Ambulatory Visit | Attending: Physician Assistant | Admitting: Physician Assistant

## 2019-06-30 DIAGNOSIS — R079 Chest pain, unspecified: Secondary | ICD-10-CM | POA: Insufficient documentation

## 2019-06-30 DIAGNOSIS — R0609 Other forms of dyspnea: Secondary | ICD-10-CM | POA: Insufficient documentation

## 2019-06-30 LAB — NM MYOCAR MULTI W/SPECT W/WALL MOTION / EF
LV dias vol: 74 mL (ref 62–150)
LV sys vol: 26 mL
Peak HR: 112 {beats}/min
RATE: 0.31
Rest HR: 81 {beats}/min
SDS: 0
SRS: 0
SSS: 0
TID: 1.23

## 2019-06-30 MED ORDER — TECHNETIUM TC 99M TETROFOSMIN IV KIT
10.0000 | PACK | Freq: Once | INTRAVENOUS | Status: AC | PRN
  Administered 2019-06-30: 11 via INTRAVENOUS

## 2019-06-30 MED ORDER — SODIUM CHLORIDE 0.9% FLUSH
INTRAVENOUS | Status: AC
  Administered 2019-06-30: 10 mL via INTRAVENOUS
  Filled 2019-06-30: qty 10

## 2019-06-30 MED ORDER — TECHNETIUM TC 99M TETROFOSMIN IV KIT
30.0000 | PACK | Freq: Once | INTRAVENOUS | Status: AC | PRN
  Administered 2019-06-30: 11:00:00 32.7 via INTRAVENOUS

## 2019-06-30 MED ORDER — REGADENOSON 0.4 MG/5ML IV SOLN
INTRAVENOUS | Status: AC
  Administered 2019-06-30: 0.4 mg via INTRAVENOUS
  Filled 2019-06-30: qty 5

## 2019-06-30 NOTE — Telephone Encounter (Signed)
Result Notes for NM Myocar Multi W/Spect W/Wall Motion / EF  Notes recorded by Therisa Doyne on 06/30/2019 at 4:53 PM EDT  [facility] for results. Also sending results through [place]. Will order Metoprolol after speaking with the patient.  ------   Notes recorded by Lonn Georgia, PA-C on 06/30/2019 at 3:46 PM EDT  Please let him know his stress test was fine.  BP was elevated at the test, not extremely high, but > 130/80.  Important to keep him at goal, need to keep the grafts open.  Add Toprol XL 25 mg qd, follow BP/HR, let us know if any side effects.  As long as HR >55, do not need to worry.  Thanks

## 2019-07-01 MED ORDER — METOPROLOL SUCCINATE ER 25 MG PO TB24: 25.0000 mg | ORAL_TABLET | Freq: Every day | ORAL | 11 refills | Status: DC

## 2019-07-01 NOTE — Telephone Encounter (Signed)
Patient returned call

## 2019-07-01 NOTE — Telephone Encounter (Signed)
Lm to call back ./cy 

## 2019-07-01 NOTE — Telephone Encounter (Signed)
Pt aware of stress results and recommendations and agrees with plan. Script sent to [facility] in [place] at pt's request ./cy

## 2019-07-01 NOTE — Addendum Note (Signed)
Addended by: Devra Dopp E on: 07/01/2019 09:39 AM   Modules accepted: Orders

## 2019-07-23 ENCOUNTER — Other Ambulatory Visit (HOSPITAL_COMMUNITY)
Admission: RE | Admit: 2019-07-23 | Discharge: 2019-07-23 | Disposition: A | Discharge status: Home / Self Care | Payer: Self-pay | Source: Ambulatory Visit | Attending: Physician Assistant | Admitting: Physician Assistant

## 2019-07-23 DIAGNOSIS — Z131 Encounter for screening for diabetes mellitus: Secondary | ICD-10-CM | POA: Insufficient documentation

## 2019-07-23 DIAGNOSIS — I10 Essential (primary) hypertension: Secondary | ICD-10-CM | POA: Insufficient documentation

## 2019-07-23 DIAGNOSIS — I2510 Atherosclerotic heart disease of native coronary artery without angina pectoris: Secondary | ICD-10-CM | POA: Insufficient documentation

## 2019-07-23 DIAGNOSIS — E785 Hyperlipidemia, unspecified: Secondary | ICD-10-CM | POA: Insufficient documentation

## 2019-07-23 LAB — LIPID PANEL
Cholesterol: 127 mg/dL (ref 0–200)
HDL: 27 mg/dL — ABNORMAL LOW (ref >40)
LDL Cholesterol: 72 mg/dL (ref 0–99)
Total CHOL/HDL Ratio: 4.7 RATIO
Triglycerides: 139 mg/dL (ref <150)
VLDL: 28 mg/dL (ref 0–40)

## 2019-07-23 LAB — COMPREHENSIVE METABOLIC PANEL
ALT: 63 U/L — ABNORMAL HIGH (ref 0–44)
AST: 39 U/L (ref 15–41)
Albumin: 4.1 g/dL (ref 3.5–5.0)
Alkaline Phosphatase: 61 U/L (ref 38–126)
Anion gap: 7 (ref 5–15)
BUN: 20 mg/dL (ref 6–20)
CO2: 23 mmol/L (ref 22–32)
Calcium: 9.1 mg/dL (ref 8.9–10.3)
Chloride: 111 mmol/L (ref 98–111)
Creatinine, Ser: 0.97 mg/dL (ref 0.61–1.24)
GFR calc Af Amer: >60 mL/min (ref >60)
GFR calc non Af Amer: >60 mL/min (ref >60)
Glucose, Bld: 128 mg/dL — ABNORMAL HIGH (ref 70–99)
Potassium: 4.3 mmol/L (ref 3.5–5.1)
Sodium: 141 mmol/L (ref 135–145)
Total Bilirubin: 0.3 mg/dL (ref 0.3–1.2)
Total Protein: 7 g/dL (ref 6.5–8.1)

## 2019-07-24 LAB — HEMOGLOBIN A1C
Hgb A1c MFr Bld: 5.9 % — ABNORMAL HIGH (ref 4.8–5.6)
Mean Plasma Glucose: 123 mg/dL

## 2019-07-26 ENCOUNTER — Other Ambulatory Visit: Payer: Self-pay

## 2019-07-26 ENCOUNTER — Encounter: Payer: Self-pay | Admitting: Physician Assistant

## 2019-07-26 ENCOUNTER — Ambulatory Visit: Payer: Self-pay | Admitting: Physician Assistant

## 2019-07-26 ENCOUNTER — Other Ambulatory Visit: Payer: Self-pay | Admitting: Physician Assistant

## 2019-07-26 VITALS — BP 100/70 | HR 77 | Temp 97.9°F | Wt 220.6 lb

## 2019-07-26 DIAGNOSIS — R7303 Prediabetes: Secondary | ICD-10-CM

## 2019-07-26 DIAGNOSIS — I2510 Atherosclerotic heart disease of native coronary artery without angina pectoris: Secondary | ICD-10-CM

## 2019-07-26 DIAGNOSIS — Z1211 Encounter for screening for malignant neoplasm of colon: Secondary | ICD-10-CM

## 2019-07-26 DIAGNOSIS — F419 Anxiety disorder, unspecified: Secondary | ICD-10-CM

## 2019-07-26 DIAGNOSIS — E669 Obesity, unspecified: Secondary | ICD-10-CM

## 2019-07-26 DIAGNOSIS — Z125 Encounter for screening for malignant neoplasm of prostate: Secondary | ICD-10-CM

## 2019-07-26 DIAGNOSIS — E785 Hyperlipidemia, unspecified: Secondary | ICD-10-CM

## 2019-07-26 NOTE — Progress Notes (Signed)
BP 100/70   Pulse 77   Temp 97.9 F (36.6 C)   Wt 220 lb 9.6 oz (100.1 kg)   SpO2 98%   BMI 31.65 kg/m    Subjective:    Patient ID: Edward Fisher, male    DOB: 1965/12/26, 53 y.o.   MRN: [id number]  HPI: Edward Fisher is a 53 y.o. male presenting on 07/26/2019 for No chief complaint on file.   HPI   Pt had a negative covid 19 screening questionnaire.      Pt says he is concerned aobut a place on his face- it has been there for years.  He says it gets better and then worsens.  He says sometimes stuff comes out of it.     He says his anxiety comes and goes.  He is still having worries about driving long ways by himself.    He says that is really the only thing that bothers him repeadedly.  He recently went to cardiology and had a reassuring stress test.  He was put back on a beta-blocker.   He is not exercising.  He is aware that he has put on quite a bit of weight over the past 6 months.  He reports that at times his back hurts but seems to be relieved when he re-positions himself.    Relevant past medical, surgical, family and social history reviewed and updated as indicated. Interim medical history since our last visit reviewed. Allergies and medications reviewed and updated.   Current Outpatient Medications:  .  aspirin 81 MG chewable tablet, Chew 81 mg by mouth every evening. TO START DOSAGE IN 6 MONTHS APPROXIMATELY July 2014, Disp: , Rfl:  .  atorvastatin (LIPITOR) 20 MG tablet, Take 1 tablet by mouth once daily, Disp: 30 tablet, Rfl: 6 .  metoprolol succinate (TOPROL XL) 25 MG 24 hr tablet, Take 1 tablet (25 mg total) by mouth daily., Disp: 30 tablet, Rfl: 11 .  nitroGLYCERIN (NITROSTAT) 0.4 MG SL tablet, Place 1 tablet (0.4 mg total) under the tongue every 5 (five) minutes as needed for chest pain., Disp: 25 tablet, Rfl: 3 .  PARoxetine (PAXIL) 20 MG tablet, Take 1 tablet (20 mg total) by mouth daily., Disp: 30 tablet, Rfl: 3 .  omega-3 acid ethyl esters (LOVAZA) 1  G capsule, Take 1 g by mouth every evening., Disp: , Rfl:     Review of Systems  Per HPI unless specifically indicated above     Objective:    BP 100/70   Pulse 77   Temp 97.9 F (36.6 C)   Wt 220 lb 9.6 oz (100.1 kg)   SpO2 98%   BMI 31.65 kg/m   Wt Readings from Last 3 Encounters:  07/26/19 220 lb 9.6 oz (100.1 kg)  06/17/19 220 lb (99.8 kg)  11/23/18 202 lb 8 oz (91.9 kg)    Physical Exam Vitals signs reviewed.  Constitutional:      General: He is not in acute distress.    Appearance: Normal appearance. He is well-developed. He is obese. He is not ill-appearing.  HENT:     Head: Normocephalic and atraumatic.  Neck:     Musculoskeletal: Neck supple.  Cardiovascular:     Rate and Rhythm: Normal rate and regular rhythm.  Pulmonary:     Effort: Pulmonary effort is normal.     Breath sounds: Normal breath sounds. No wheezing.  Abdominal:     General: Bowel sounds are normal.  Palpations: Abdomen is soft.     Tenderness: There is no abdominal tenderness.  Musculoskeletal:     Right lower leg: No edema.     Left lower leg: No edema.  Lymphadenopathy:     Cervical: No cervical adenopathy.  Skin:    General: Skin is warm and dry.     Comments: Very small sebaceous cyst R cheek.  No redness or active drainage.    Neurological:     Mental Status: He is alert and oriented to person, place, and time.  Psychiatric:        Attention and Perception: Attention normal.        Speech: Speech normal.        Behavior: Behavior normal. Behavior is cooperative.     Results for orders placed or performed during the hospital encounter of 07/23/19  Hemoglobin A1c  Result Value Ref Range   Hgb A1c MFr Bld 5.9 (H) 4.8 - 5.6 %   Mean Plasma Glucose 123 mg/dL  Lipid panel  Result Value Ref Range   Cholesterol 127 0 - 200 mg/dL   Triglycerides 139 <150 mg/dL   HDL 27 (L) >40 mg/dL   Total CHOL/HDL Ratio 4.7 RATIO   VLDL 28 0 - 40 mg/dL   LDL Cholesterol 72 0 - 99 mg/dL   Comprehensive metabolic panel  Result Value Ref Range   Sodium 141 135 - 145 mmol/L   Potassium 4.3 3.5 - 5.1 mmol/L   Chloride 111 98 - 111 mmol/L   CO2 23 22 - 32 mmol/L   Glucose, Bld 128 (H) 70 - 99 mg/dL   BUN 20 6 - 20 mg/dL   Creatinine, Ser 0.97 0.61 - 1.24 mg/dL   Calcium 9.1 8.9 - 10.3 mg/dL   Total Protein 7.0 6.5 - 8.1 g/dL   Albumin 4.1 3.5 - 5.0 g/dL   AST 39 15 - 41 U/L   ALT 63 (H) 0 - 44 U/L   Alkaline Phosphatase 61 38 - 126 U/L   Total Bilirubin 0.3 0.3 - 1.2 mg/dL   GFR calc non Af Amer >60 >60 mL/min   GFR calc Af Amer >60 >60 mL/min   Anion gap 7 5 - 15      Assessment & Plan:    Encounter Diagnoses  Name Primary?  [name] Arteriosclerotic cardiovascular disease (ASCVD) Yes  . Hyperlipidemia, unspecified hyperlipidemia type   . Anxiety   . Prediabetes   . Obesity, unspecified classification, unspecified obesity type, unspecified whether serious comorbidity present   . Special screening for malignant neoplasms, colon      -reviewed labs with pt -No med changes today -Counseled on weight loss and getting rid of abdomen which contributes to his back pain, prediabetes, and increases his risk for more heart problems.   -pt was given iFOBT for colon cancer screening -Will add psa to next labs -pt to follow up in 4 months.  He is to contact office sooner prn

## 2019-08-02 ENCOUNTER — Ambulatory Visit: Payer: Self-pay | Admitting: Physician Assistant

## 2019-10-18 ENCOUNTER — Other Ambulatory Visit: Payer: Self-pay | Admitting: Physician Assistant

## 2019-11-09 ENCOUNTER — Other Ambulatory Visit: Payer: Self-pay | Admitting: Physician Assistant

## 2019-11-22 ENCOUNTER — Other Ambulatory Visit: Payer: Self-pay

## 2019-11-22 ENCOUNTER — Other Ambulatory Visit (HOSPITAL_COMMUNITY)
Admission: RE | Admit: 2019-11-22 | Discharge: 2019-11-22 | Disposition: A | Discharge status: Home / Self Care | Payer: Self-pay | Source: Ambulatory Visit | Attending: Physician Assistant | Admitting: Physician Assistant

## 2019-11-22 DIAGNOSIS — Z125 Encounter for screening for malignant neoplasm of prostate: Secondary | ICD-10-CM | POA: Insufficient documentation

## 2019-11-22 DIAGNOSIS — I2510 Atherosclerotic heart disease of native coronary artery without angina pectoris: Secondary | ICD-10-CM | POA: Insufficient documentation

## 2019-11-22 DIAGNOSIS — E785 Hyperlipidemia, unspecified: Secondary | ICD-10-CM | POA: Insufficient documentation

## 2019-11-22 LAB — LIPID PANEL
Cholesterol: 139 mg/dL (ref 0–200)
HDL: 31 mg/dL — ABNORMAL LOW (ref >40)
LDL Cholesterol: 78 mg/dL (ref 0–99)
Total CHOL/HDL Ratio: 4.5 RATIO
Triglycerides: 149 mg/dL (ref <150)
VLDL: 30 mg/dL (ref 0–40)

## 2019-11-22 LAB — COMPREHENSIVE METABOLIC PANEL
ALT: 73 U/L — ABNORMAL HIGH (ref 0–44)
AST: 38 U/L (ref 15–41)
Albumin: 4.2 g/dL (ref 3.5–5.0)
Alkaline Phosphatase: 57 U/L (ref 38–126)
Anion gap: 8 (ref 5–15)
BUN: 20 mg/dL (ref 6–20)
CO2: 22 mmol/L (ref 22–32)
Calcium: 9.1 mg/dL (ref 8.9–10.3)
Chloride: 109 mmol/L (ref 98–111)
Creatinine, Ser: 0.94 mg/dL (ref 0.61–1.24)
GFR calc Af Amer: >60 mL/min (ref >60)
GFR calc non Af Amer: >60 mL/min (ref >60)
Glucose, Bld: 129 mg/dL — ABNORMAL HIGH (ref 70–99)
Potassium: 4.3 mmol/L (ref 3.5–5.1)
Sodium: 139 mmol/L (ref 135–145)
Total Bilirubin: 0.4 mg/dL (ref 0.3–1.2)
Total Protein: 7.2 g/dL (ref 6.5–8.1)

## 2019-11-22 LAB — PSA: Prostatic Specific Antigen: 1.03 ng/mL (ref 0.00–4.00)

## 2019-11-24 ENCOUNTER — Ambulatory Visit: Payer: Self-pay | Admitting: Physician Assistant

## 2019-11-24 DIAGNOSIS — I10 Essential (primary) hypertension: Secondary | ICD-10-CM

## 2019-11-24 DIAGNOSIS — Z20822 Contact with and (suspected) exposure to covid-19: Secondary | ICD-10-CM

## 2019-11-24 DIAGNOSIS — I2510 Atherosclerotic heart disease of native coronary artery without angina pectoris: Secondary | ICD-10-CM

## 2019-11-24 DIAGNOSIS — E785 Hyperlipidemia, unspecified: Secondary | ICD-10-CM

## 2019-11-24 NOTE — Progress Notes (Signed)
There were no vitals taken for this visit.   Subjective:    Patient ID: Edward Fisher, male    DOB: 1966/09/01, 54 y.o.   MRN: [id number]  HPI: Edward Fisher is a 54 y.o. male presenting on 11/24/2019 for No chief complaint on file.   HPI    This is a telemedicine appointment due to coronavirus pandemic.  It is via Telephone due to pt does not have a video enable device.   I connected with  Edward Fisher on 11/24/2019 by a video enabled telemedicine application and verified that I am speaking with the correct person using two identifiers.   I discussed the limitations of evaluation and management by telemedicine. The patient expressed understanding and agreed to proceed.     Appointment quality was negatively affected due to pt had poor phone connection that kept breaking up making it difficult to hear him.  Pt 41yoM with hx CAD s/p CABG.  He says he thinks he was exposed to covid and he got tested at health department earlier today.  He says he is feeling well.  He does admit to having put on weight recently.          Relevant past medical, surgical, family and social history reviewed and updated as indicated. Interim medical history since our last visit reviewed. Allergies and medications reviewed and updated.    Current Outpatient Medications:  .  aspirin 81 MG chewable tablet, Chew 81 mg by mouth every evening. TO START DOSAGE IN 6 MONTHS APPROXIMATELY July 2014, Disp: , Rfl:  .  atorvastatin (LIPITOR) 20 MG tablet, Take 1 tablet by mouth once daily, Disp: 30 tablet, Rfl: 6 .  metoprolol succinate (TOPROL XL) 25 MG 24 hr tablet, Take 1 tablet (25 mg total) by mouth daily., Disp: 30 tablet, Rfl: 11 .  omega-3 acid ethyl esters (LOVAZA) 1 G capsule, Take 1 g by mouth every evening., Disp: , Rfl:  .  PARoxetine (PAXIL) 20 MG tablet, Take 1 tablet by mouth once daily, Disp: 30 tablet, Rfl: 6 .  nitroGLYCERIN (NITROSTAT) 0.4 MG SL tablet, Place 1 tablet (0.4 mg total) under the  tongue every 5 (five) minutes as needed for chest pain. (Patient not taking: Reported on 11/24/2019), Disp: 25 tablet, Rfl: 3   Review of Systems  Per HPI unless specifically indicated above     Objective:    There were no vitals taken for this visit.  Wt Readings from Last 3 Encounters:  07/26/19 220 lb 9.6 oz (100.1 kg)  06/17/19 220 lb (99.8 kg)  11/23/18 202 lb 8 oz (91.9 kg)    Physical Exam Pulmonary:     Effort: Pulmonary effort is normal. No respiratory distress.  Neurological:     Mental Status: He is alert and oriented to person, place, and time.  Psychiatric:        Attention and Perception: Attention normal.        Speech: Speech normal.        Behavior: Behavior is cooperative.     Results for orders placed or performed during the hospital encounter of 11/22/19  PSA  Result Value Ref Range   Prostatic Specific Antigen 1.03 0.00 - 4.00 ng/mL  Lipid panel  Result Value Ref Range   Cholesterol 139 0 - 200 mg/dL   Triglycerides 149 <150 mg/dL   HDL 31 (L) >40 mg/dL   Total CHOL/HDL Ratio 4.5 RATIO   VLDL 30 0 - 40 mg/dL  LDL Cholesterol 78 0 - 99 mg/dL  Comprehensive metabolic panel  Result Value Ref Range   Sodium 139 135 - 145 mmol/L   Potassium 4.3 3.5 - 5.1 mmol/L   Chloride 109 98 - 111 mmol/L   CO2 22 22 - 32 mmol/L   Glucose, Bld 129 (H) 70 - 99 mg/dL   BUN 20 6 - 20 mg/dL   Creatinine, Ser 0.94 0.61 - 1.24 mg/dL   Calcium 9.1 8.9 - 10.3 mg/dL   Total Protein 7.2 6.5 - 8.1 g/dL   Albumin 4.2 3.5 - 5.0 g/dL   AST 38 15 - 41 U/L   ALT 73 (H) 0 - 44 U/L   Alkaline Phosphatase 57 38 - 126 U/L   Total Bilirubin 0.4 0.3 - 1.2 mg/dL   GFR calc non Af Amer >60 >60 mL/min   GFR calc Af Amer >60 >60 mL/min   Anion gap 8 5 - 15      Assessment & Plan:     Encounter Diagnoses  Name Primary?  Edward Fisher Arteriosclerotic cardiovascular disease (ASCVD) Yes  . Hyperlipidemia, unspecified hyperlipidemia type   . Essential hypertension   . Exposure to  COVID-19 virus      -reviewed labs with pt -pt to continue current medications -Counseled on weight management -pt counseled about covid quarantining and all questions were answered -pt to follow up 4 months.  He is to contact office sooner prn

## 2019-11-27 ENCOUNTER — Encounter: Payer: Self-pay | Admitting: Physician Assistant

## 2020-01-17 ENCOUNTER — Other Ambulatory Visit: Payer: Self-pay

## 2020-01-17 ENCOUNTER — Ambulatory Visit: Payer: Self-pay | Admitting: Physician Assistant

## 2020-01-17 ENCOUNTER — Encounter: Payer: Self-pay | Admitting: Physician Assistant

## 2020-01-17 VITALS — BP 114/70 | HR 69 | Temp 98.1°F | Wt 226.6 lb

## 2020-01-17 DIAGNOSIS — I2510 Atherosclerotic heart disease of native coronary artery without angina pectoris: Secondary | ICD-10-CM

## 2020-01-17 DIAGNOSIS — G479 Sleep disorder, unspecified: Secondary | ICD-10-CM

## 2020-01-17 DIAGNOSIS — Z951 Presence of aortocoronary bypass graft: Secondary | ICD-10-CM

## 2020-01-17 DIAGNOSIS — G250 Essential tremor: Secondary | ICD-10-CM
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
## 2020-01-17 NOTE — Patient Instructions (Signed)
Parkinson's Disease Parkinson's disease causes problems with movements. It is a long-term condition. It gets worse over time (is progressive). It affects each person in different ways. It makes it harder for you to:  Control how your body moves.  Move your body normally. The condition can range from mild to very bad (advanced). What are the causes? This condition results from a loss of brain cells called neurons. These brain cells make a chemical called dopamine, which is needed to control body movement. As the condition gets worse, the brain cells make less dopamine. This makes it hard to move or control your movements. The exact cause of this condition is not known. What increases the risk?  Being male.  Being age 50 or older.  Having family members who had Parkinson's disease.  Having had an injury to the brain.  Being very sad (depressed).  Being around things that are harmful or poisonous. What are the signs or symptoms? Symptoms of this condition can vary. The main symptoms have to do with movement. These include:  A tremor or shaking while you are resting that you cannot control.  Stiffness in your neck, arms, and legs.  Slowing of movement. This may include: ? Losing expressions of the face. ? Having trouble making small movements that are needed to button your clothing or brush your teeth.  Walking in a way that is not normal. You may walk with short, shuffling steps.  Loss of balance when standing. You may sway, fall backward, or have trouble making turns. Other symptoms include:  Being very sad, worried, or confused.  Seeing or hearing things that are not real.  Losing thinking abilities (dementia).  Trouble speaking or swallowing.  Having a hard time pooping (constipation).  Needing to pee right away, peeing often, or not being able to control when you pee or poop.  Sleep problems. How is this treated? There is no cure. The goal of treatment is to  manage your symptoms. Treatment may include:  Medicines.  Therapy to help with talking or movement.  Surgery to reduce shaking and other movements that you cannot control. Follow these instructions at home: Medicines  Take over-the-counter and prescription medicines only as told by your doctor.  Avoid taking pain or sleeping medicines. Eating and drinking  Follow instructions from your doctor about what you cannot eat or drink.  Do not drink alcohol. Activity  Talk with your doctor about if it is safe for you to drive.  Do exercises as told by your doctor. Lifestyle      Put in grab bars and railings in your home. These help to prevent falls.  Do not use any products that contain nicotine or tobacco, such as cigarettes, e-cigarettes, and chewing tobacco. If you need help quitting, ask your doctor.  Join a support group. General instructions  Talk with your doctor about what you need help with and what your safety needs are.  Keep all follow-up visits as told by your doctor, including any therapy visits to help with talking or moving. This is important. Contact a doctor if:  Medicines do not help your symptoms.  You feel off-balance.  You fall at home.  You need more help at home.  You have trouble swallowing.  You have a very hard time pooping.  You have a lot of side effects from your medicines.  You feel very sad, worried, or confused. Get help right away if:  You were hurt in a fall.  You see  or hear things that are not real.  You cannot swallow without choking.  You have chest pain or trouble breathing.  You do not feel safe at home.  You have thoughts about hurting yourself or others. If you ever feel like you may hurt yourself or others, or have thoughts about taking your own life, get help right away. You can go to your nearest emergency department or call:  Your local emergency services (911 in the [place]  A suicide crisis helpline, such  as the National Suicide Prevention Lifeline at 1-800-273-8255. This is open 24 hours a day. Summary  This condition causes problems with movements.  It is a long-term condition. It gets worse over time.  There is no cure. Treatment focuses on managing your symptoms.  Talk with your doctor about what you need help with and what your safety needs are.  Keep all follow-up visits as told by your doctor. This is important. This information is not intended to replace advice given to you by your health care provider. Make sure you discuss any questions you have with your health care provider. Document Revised: 01/07/2019 Document Reviewed: 01/07/2019 Elsevier Patient Education  2020 [employer]  

## 2020-03-20 ENCOUNTER — Ambulatory Visit: Payer: Self-pay | Admitting: Physician Assistant

## 2020-03-24 ENCOUNTER — Other Ambulatory Visit (HOSPITAL_COMMUNITY)
Admission: RE | Admit: 2020-03-24 | Discharge: 2020-03-24 | Disposition: A | Discharge status: Home / Self Care | Payer: Self-pay | Source: Ambulatory Visit | Attending: Physician Assistant | Admitting: Physician Assistant

## 2020-03-24 ENCOUNTER — Other Ambulatory Visit: Payer: Self-pay

## 2020-03-24 DIAGNOSIS — E785 Hyperlipidemia, unspecified: Secondary | ICD-10-CM | POA: Insufficient documentation

## 2020-03-24 DIAGNOSIS — I2510 Atherosclerotic heart disease of native coronary artery without angina pectoris: Secondary | ICD-10-CM | POA: Insufficient documentation

## 2020-03-24 DIAGNOSIS — I10 Essential (primary) hypertension: Secondary | ICD-10-CM | POA: Insufficient documentation

## 2020-03-24 LAB — COMPREHENSIVE METABOLIC PANEL
ALT: 66 U/L — ABNORMAL HIGH (ref 0–44)
AST: 47 U/L — ABNORMAL HIGH (ref 15–41)
Albumin: 4.4 g/dL (ref 3.5–5.0)
Alkaline Phosphatase: 67 U/L (ref 38–126)
Anion gap: 10 (ref 5–15)
BUN: 23 mg/dL — ABNORMAL HIGH (ref 6–20)
CO2: 24 mmol/L (ref 22–32)
Calcium: 9.6 mg/dL (ref 8.9–10.3)
Chloride: 105 mmol/L (ref 98–111)
Creatinine, Ser: 1.02 mg/dL (ref 0.61–1.24)
GFR calc Af Amer: >60 mL/min (ref >60)
GFR calc non Af Amer: >60 mL/min (ref >60)
Glucose, Bld: 129 mg/dL — ABNORMAL HIGH (ref 70–99)
Potassium: 4.2 mmol/L (ref 3.5–5.1)
Sodium: 139 mmol/L (ref 135–145)
Total Bilirubin: 1.1 mg/dL (ref 0.3–1.2)
Total Protein: 7.5 g/dL (ref 6.5–8.1)

## 2020-03-24 LAB — LIPID PANEL
Cholesterol: 128 mg/dL (ref 0–200)
HDL: 27 mg/dL — ABNORMAL LOW (ref >40)
LDL Cholesterol: 69 mg/dL (ref 0–99)
Total CHOL/HDL Ratio: 4.7 RATIO
Triglycerides: 161 mg/dL — ABNORMAL HIGH (ref <150)
VLDL: 32 mg/dL (ref 0–40)

## 2020-03-27 ENCOUNTER — Ambulatory Visit (HOSPITAL_COMMUNITY)
Admission: RE | Admit: 2020-03-27 | Discharge: 2020-03-27 | Disposition: A | Discharge status: Home / Self Care | Payer: Self-pay | Source: Ambulatory Visit | Attending: Physician Assistant | Admitting: Physician Assistant

## 2020-03-27 ENCOUNTER — Other Ambulatory Visit: Payer: Self-pay

## 2020-03-27 ENCOUNTER — Encounter: Payer: Self-pay | Admitting: Physician Assistant

## 2020-03-27 ENCOUNTER — Ambulatory Visit: Payer: Self-pay | Admitting: Physician Assistant

## 2020-03-27 ENCOUNTER — Other Ambulatory Visit: Payer: Self-pay | Admitting: Physician Assistant

## 2020-03-27 VITALS — BP 90/60 | HR 77 | Temp 97.9°F | Wt 218.9 lb

## 2020-03-27 DIAGNOSIS — I2510 Atherosclerotic heart disease of native coronary artery without angina pectoris: Secondary | ICD-10-CM

## 2020-03-27 DIAGNOSIS — Z951 Presence of aortocoronary bypass graft: Secondary | ICD-10-CM

## 2020-03-27 DIAGNOSIS — E785 Hyperlipidemia, unspecified: Secondary | ICD-10-CM

## 2020-03-27 DIAGNOSIS — I10 Essential (primary) hypertension: Secondary | ICD-10-CM

## 2020-03-27 DIAGNOSIS — M21371 Foot drop, right foot: Secondary | ICD-10-CM

## 2020-03-27 DIAGNOSIS — M25551 Pain in right hip: Secondary | ICD-10-CM

## 2020-03-27 DIAGNOSIS — R7989 Other specified abnormal findings of blood chemistry: Secondary | ICD-10-CM

## 2020-03-27 DIAGNOSIS — Z2821 Immunization not carried out because of patient refusal: Secondary | ICD-10-CM

## 2020-03-27 DIAGNOSIS — F419 Anxiety disorder, unspecified: Secondary | ICD-10-CM

## 2020-03-27 IMAGING — DX DG HIP (WITH OR WITHOUT PELVIS) 2-3V*R*
3 series · 3 of 3 positions shown · non-contrast
Comparison: CT renal colic [DATE]

CLINICAL DATA: Right foot drop and right hip pain for 1 week

EXAM:
DG HIP (WITH OR WITHOUT PELVIS) 2-3V RIGHT

[pelvis ap]
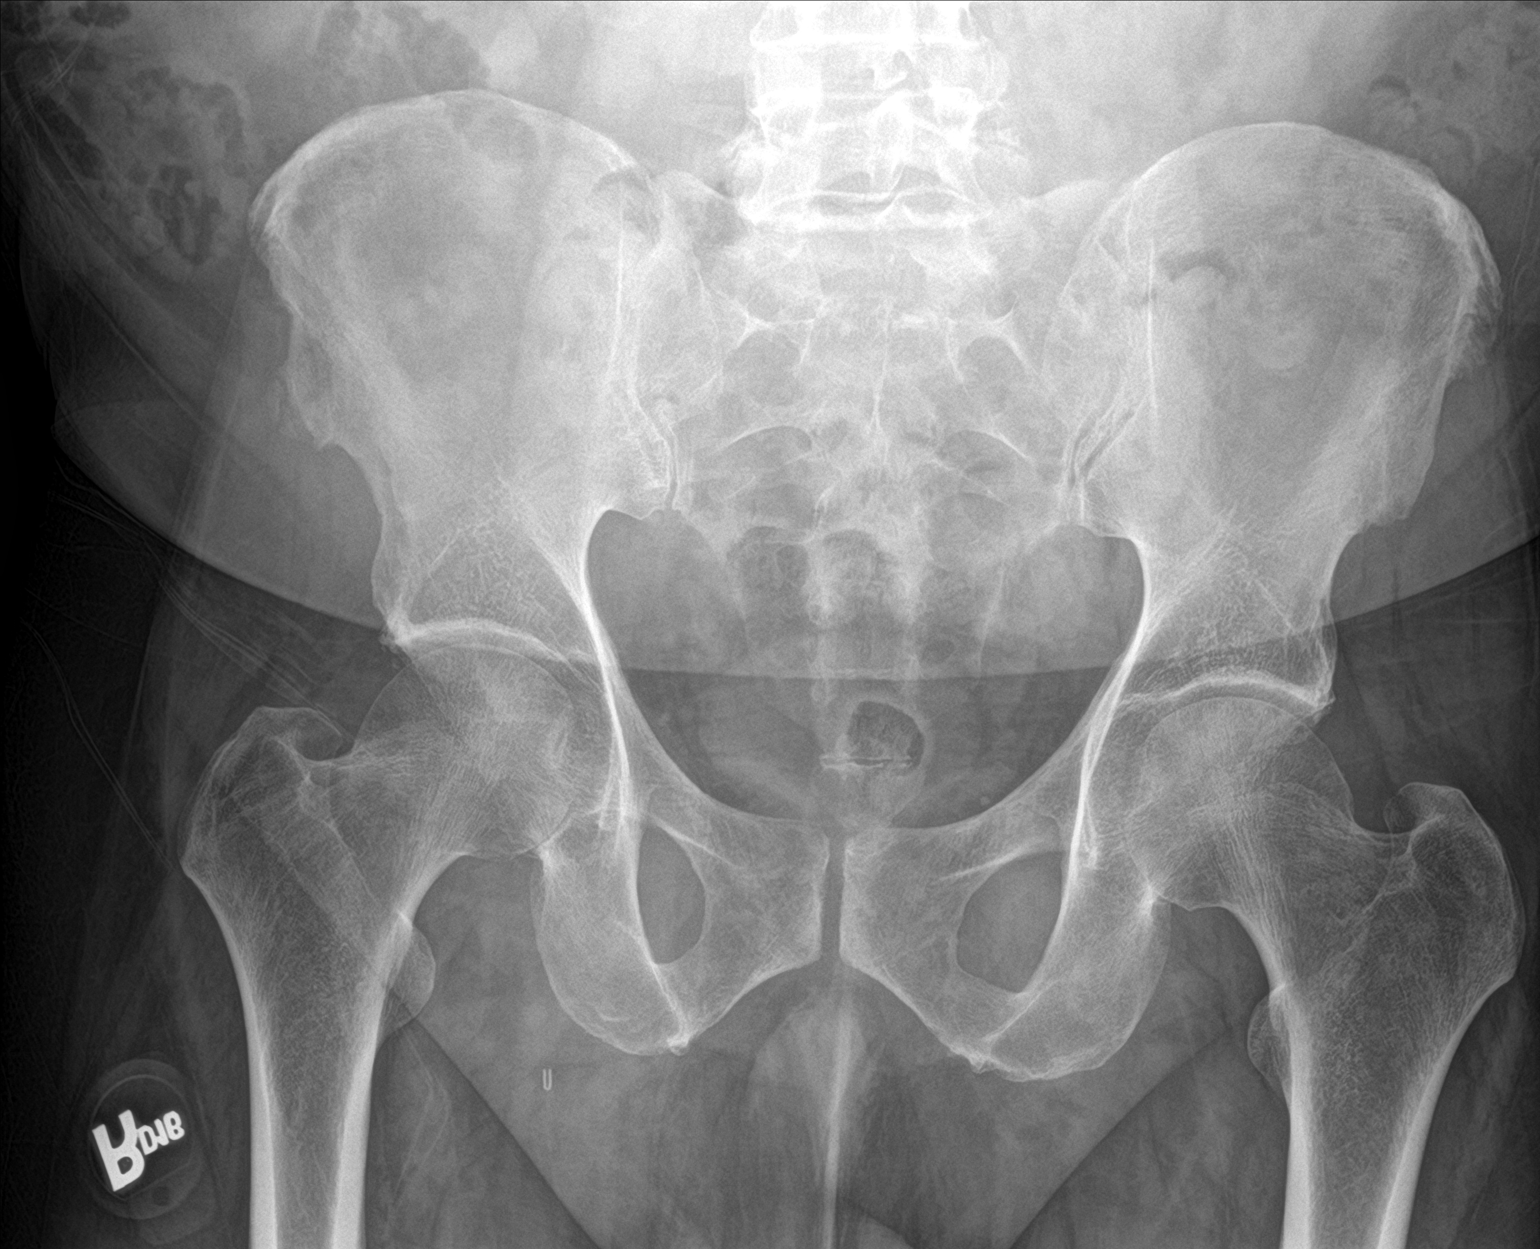

[hip ap]
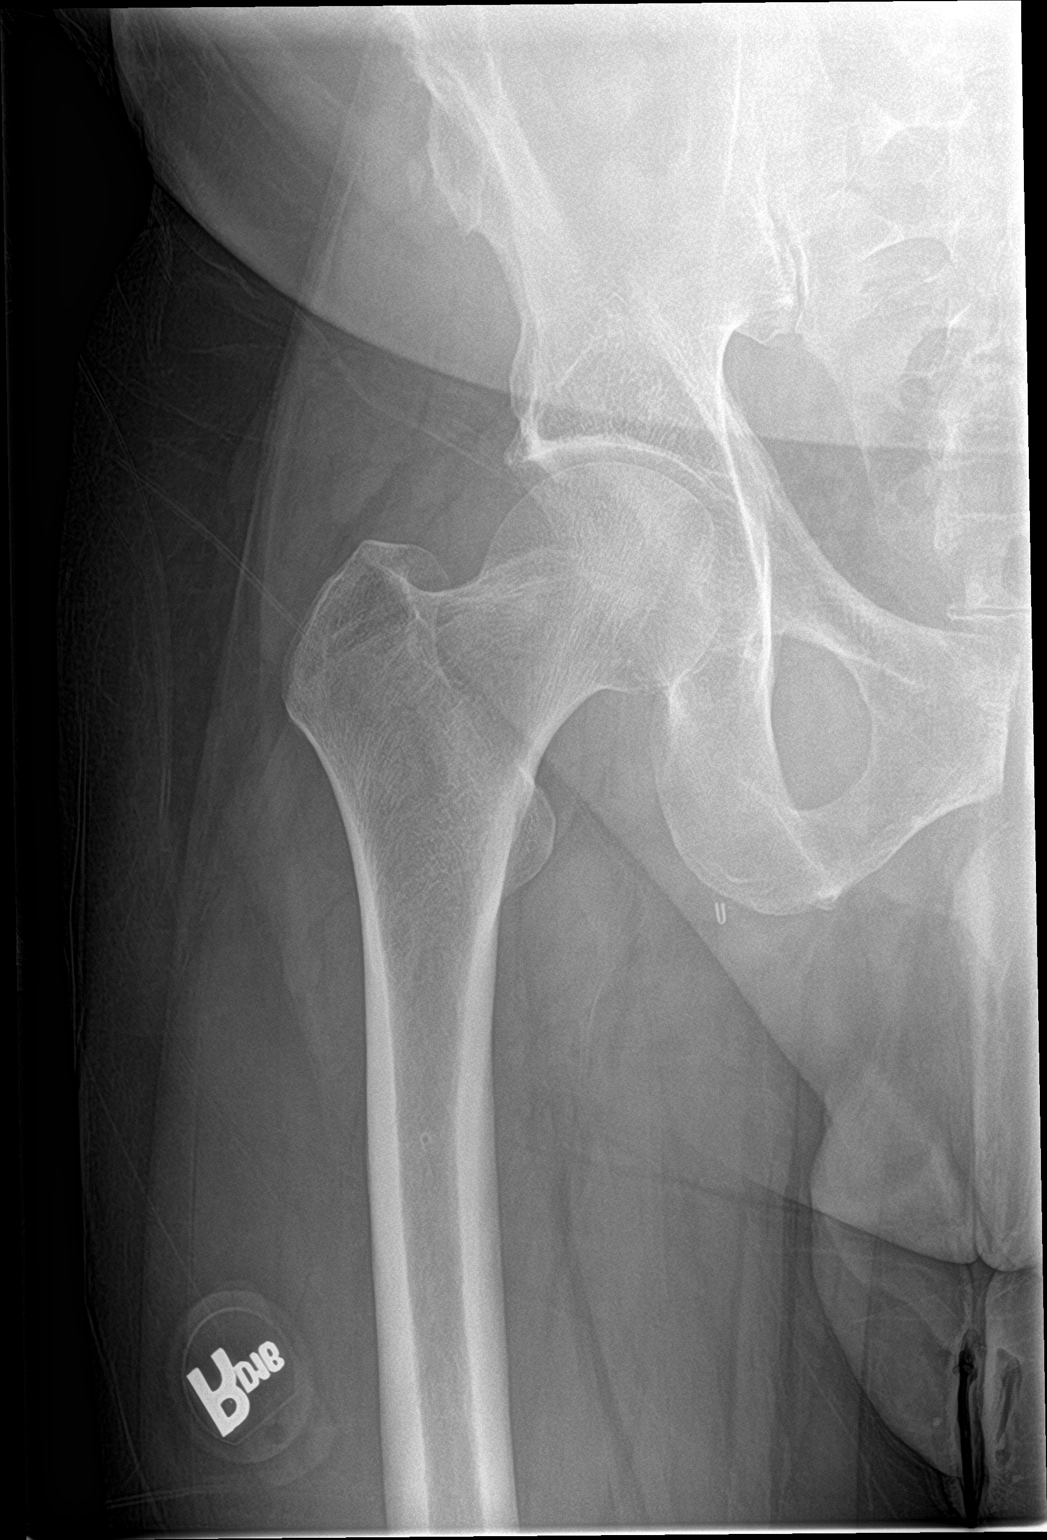

[hip frog leg]
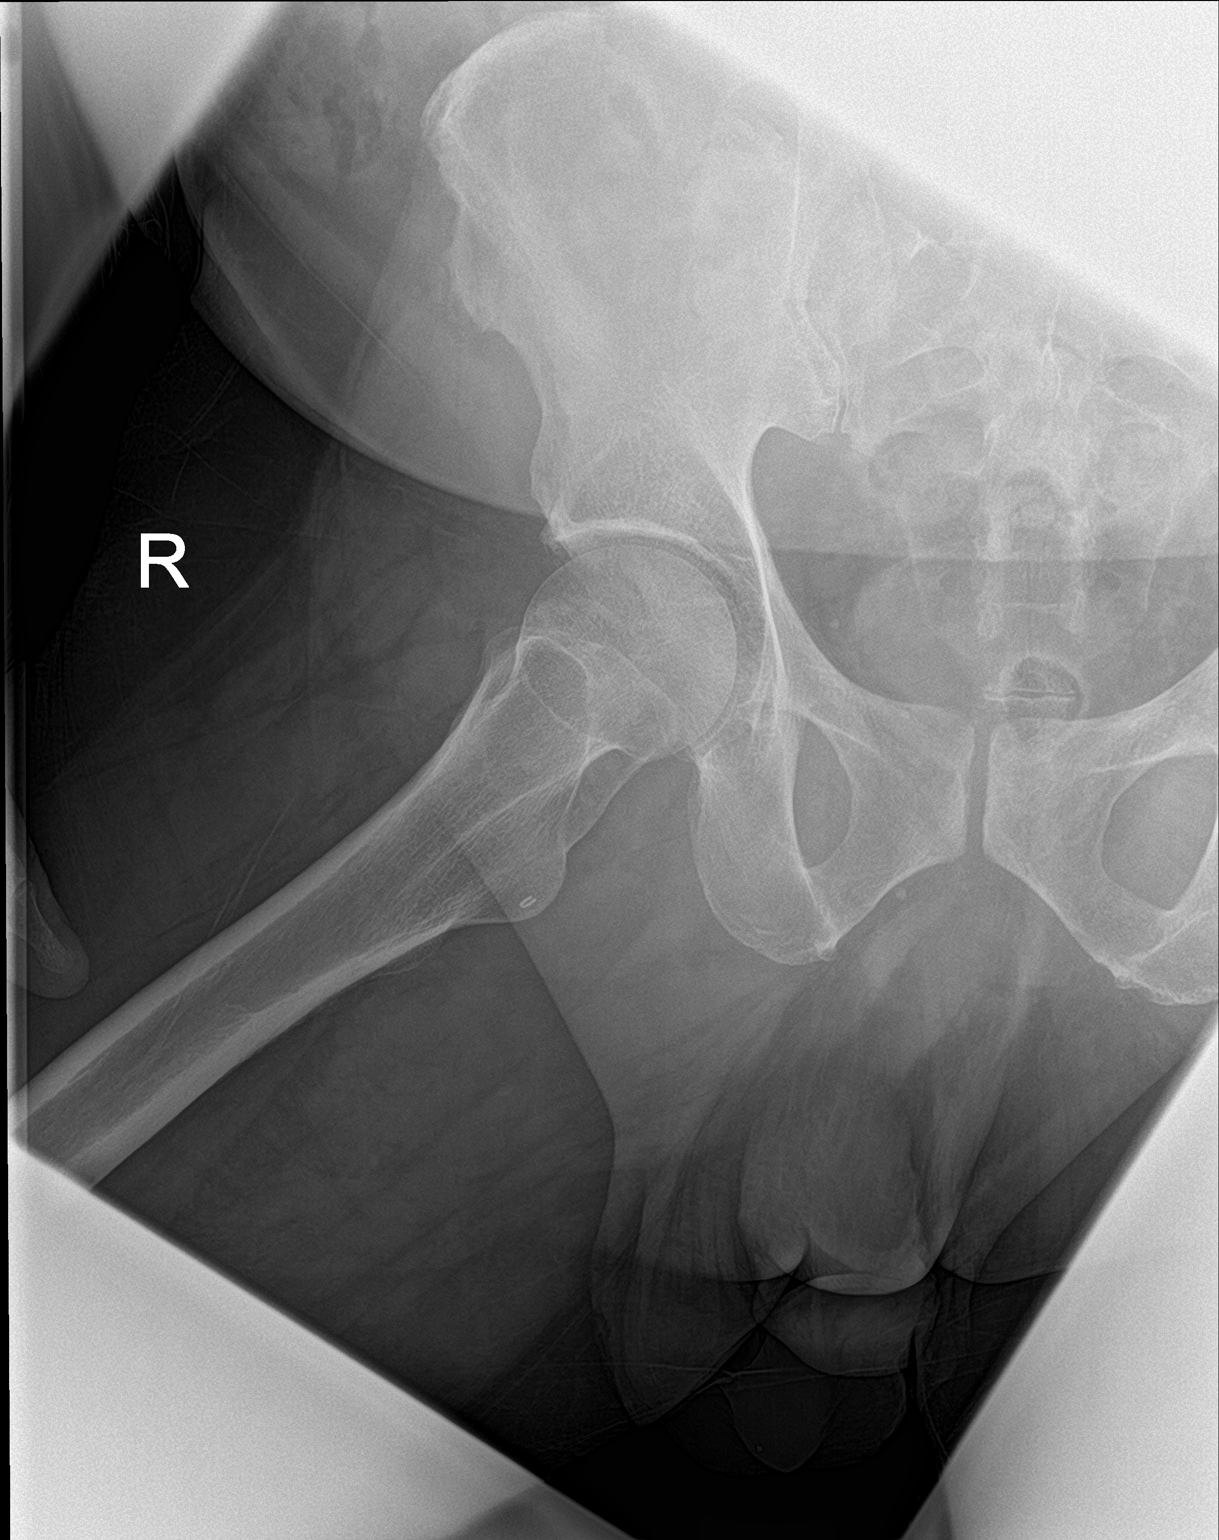

[3 of 3 positions shown; findings below may reference images not displayed]

FINDINGS: The bones of the pelvis are intact and congruent. No proximal
femoral fracture is seen. The femoral heads are normally located.
There is mild degenerative changes in the lumbar spine and SI
joints. More mild to moderate degenerative change seen in the
bilateral hips. No acute or suspicious osseous lesions. Few
phleboliths noted in the pelvis. A small surgical clip in the right
groin may be related to prior vascular access/closure device.
Remaining soft tissues are unremarkable.
IMPRESSION: 1. No acute osseous abnormality.
2. Mild degenerative changes in the lumbar spine, SI joints, and
hips.

## 2020-03-27 NOTE — Progress Notes (Signed)
BP 90/60   Pulse 77   Temp 97.9 F (36.6 C)   Wt 218 lb 14.4 oz (99.3 kg)   SpO2 98%   BMI 31.41 kg/m    Subjective:    Patient ID: Edward Fisher, male    DOB: Apr 22, 1966, 54 y.o.   MRN: [id number]  HPI: Edward Fisher is a 54 y.o. male presenting on 03/27/2020 for Coronary Artery Disease   HPI  Pt had a negative covid 19 screening questionnaire.    Pt is 23 yoM with hx CAD s/p CABG.  He says his Mood is good  He has No cp or sob.  He continues to work Administrator.   Pt c/o LLE pain.  He says that Certain ways he gets pain up in R hip and down into his thigh and knee.  He Says he can't lift his foot properly.   It is bothering his walking and making him trip.  He says this has been Going on about 3 week.     He thinks he has CAFA at 75%.     Relevant past medical, surgical, family and social history reviewed and updated as indicated. Interim medical history since our last visit reviewed. Allergies and medications reviewed and updated.   Current Outpatient Medications:  .  aspirin 81 MG chewable tablet, Chew 81 mg by mouth every evening. TO START DOSAGE IN 6 MONTHS APPROXIMATELY July 2014, Disp: , Rfl:  .  atorvastatin (LIPITOR) 20 MG tablet, Take 1 tablet by mouth once daily, Disp: 30 tablet, Rfl: 6 .  metoprolol succinate (TOPROL XL) 25 MG 24 hr tablet, Take 1 tablet (25 mg total) by mouth daily., Disp: 30 tablet, Rfl: 11 .  nitroGLYCERIN (NITROSTAT) 0.4 MG SL tablet, Place 1 tablet (0.4 mg total) under the tongue every 5 (five) minutes as needed for chest pain., Disp: 25 tablet, Rfl: 3 .  omega-3 acid ethyl esters (LOVAZA) 1 G capsule, Take 1 g by mouth every evening., Disp: , Rfl:  .  PARoxetine (PAXIL) 20 MG tablet, Take 1 tablet by mouth once daily, Disp: 30 tablet, Rfl: 6     Review of Systems  Per HPI unless specifically indicated above     Objective:    BP 90/60   Pulse 77   Temp 97.9 F (36.6 C)   Wt 218 lb 14.4 oz (99.3 kg)   SpO2 98%   BMI  31.41 kg/m   Wt Readings from Last 3 Encounters:  03/27/20 218 lb 14.4 oz (99.3 kg)  01/17/20 226 lb 9.6 oz (102.8 kg)  07/26/19 220 lb 9.6 oz (100.1 kg)    Physical Exam Vitals reviewed.  Constitutional:      General: He is not in acute distress.    Appearance: He is well-developed. He is obese. He is not ill-appearing.  HENT:     Head: Normocephalic and atraumatic.  Cardiovascular:     Rate and Rhythm: Normal rate and regular rhythm.  Pulmonary:     Effort: Pulmonary effort is normal.     Breath sounds: Normal breath sounds. No wheezing.  Abdominal:     General: Bowel sounds are normal.     Palpations: Abdomen is soft.     Tenderness: There is no abdominal tenderness.  Musculoskeletal:     Cervical back: Neck supple.     Right hip: Normal range of motion.     Right knee: No swelling. Normal range of motion.     Left knee: No  swelling. Normal range of motion.     Right lower leg: No edema.     Left lower leg: No edema.     Right foot: Normal range of motion and normal capillary refill. Foot drop present. No tenderness. Normal pulse.     Left foot: Normal range of motion and normal capillary refill. No foot drop or tenderness. Normal pulse.  Lymphadenopathy:     Cervical: No cervical adenopathy.  Skin:    General: Skin is warm and dry.  Neurological:     Mental Status: He is alert and oriented to person, place, and time.     Motor: Weakness present. No tremor.     Gait: Gait abnormal.     Deep Tendon Reflexes:     Reflex Scores:      Patellar reflexes are 2+ on the right side and 2+ on the left side.    Comments: UE strong and equal bilaterally.    Psychiatric:        Attention and Perception: Attention normal.        Speech: Speech normal.        Behavior: Behavior normal. Behavior is cooperative.     Results for orders placed or performed during the hospital encounter of 03/24/20  Lipid panel  Result Value Ref Range   Cholesterol 128 0 - 200 mg/dL    Triglycerides 161 (H) <150 mg/dL   HDL 27 (L) >40 mg/dL   Total CHOL/HDL Ratio 4.7 RATIO   VLDL 32 0 - 40 mg/dL   LDL Cholesterol 69 0 - 99 mg/dL  Comprehensive metabolic panel  Result Value Ref Range   Sodium 139 135 - 145 mmol/L   Potassium 4.2 3.5 - 5.1 mmol/L   Chloride 105 98 - 111 mmol/L   CO2 24 22 - 32 mmol/L   Glucose, Bld 129 (H) 70 - 99 mg/dL   BUN 23 (H) 6 - 20 mg/dL   Creatinine, Ser 1.02 0.61 - 1.24 mg/dL   Calcium 9.6 8.9 - 10.3 mg/dL   Total Protein 7.5 6.5 - 8.1 g/dL   Albumin 4.4 3.5 - 5.0 g/dL   AST 47 (H) 15 - 41 U/L   ALT 66 (H) 0 - 44 U/L   Alkaline Phosphatase 67 38 - 126 U/L   Total Bilirubin 1.1 0.3 - 1.2 mg/dL   GFR calc non Af Amer >60 >60 mL/min   GFR calc Af Amer >60 >60 mL/min   Anion gap 10 5 - 15      Assessment & Plan:     Encounter Diagnoses  Name Primary?  . Right foot drop Yes  . Right hip pain   . COVID-19 virus vaccination declined   . Arteriosclerotic cardiovascular disease (ASCVD)   . S/P CABG (coronary artery bypass graft)   . Hyperlipidemia, unspecified hyperlipidemia type   . Essential hypertension   . Anxiety   . Elevated LFTs        -will Refer to neurology for further evaluation of foot drop.   -will get Xray R hip/LS spine -pt colon cancer screening / ifobt is due september -pt to Continue current meds -pt declines covid vaccination -pt to follow up in office in  4 months.  He is to contact office for any worsening or new symptoms

## 2020-04-12 ENCOUNTER — Institutional Professional Consult (permissible substitution): Payer: Self-pay | Admitting: Neurology

## 2020-05-02 ENCOUNTER — Telehealth: Payer: Self-pay | Admitting: Neurology

## 2020-05-02 ENCOUNTER — Encounter: Payer: Self-pay | Admitting: Neurology

## 2020-05-02 ENCOUNTER — Ambulatory Visit (INDEPENDENT_AMBULATORY_CARE_PROVIDER_SITE_OTHER): Payer: Self-pay | Admitting: Neurology

## 2020-05-02 ENCOUNTER — Other Ambulatory Visit: Payer: Self-pay

## 2020-05-02 VITALS — BP 128/74 | HR 76 | Ht 70.5 in | Wt 220.0 lb

## 2020-05-02 DIAGNOSIS — I2510 Atherosclerotic heart disease of native coronary artery without angina pectoris: Secondary | ICD-10-CM

## 2020-05-02 DIAGNOSIS — G4752 REM sleep behavior disorder: Secondary | ICD-10-CM

## 2020-05-02 DIAGNOSIS — G8191 Hemiplegia, unspecified affecting right dominant side: Secondary | ICD-10-CM | POA: Insufficient documentation

## 2020-05-02 DIAGNOSIS — E119 Type 2 diabetes mellitus without complications: Secondary | ICD-10-CM

## 2020-05-02 DIAGNOSIS — I69351 Hemiplegia and hemiparesis following cerebral infarction affecting right dominant side: Secondary | ICD-10-CM

## 2020-05-02 DIAGNOSIS — R251 Tremor, unspecified: Secondary | ICD-10-CM | POA: Insufficient documentation

## 2020-05-02 MED ORDER — CLONAZEPAM 0.5 MG PO TABS: 0.5000 mg | ORAL_TABLET | Freq: Every day | ORAL | 0 refills | Status: DC

## 2020-05-02 NOTE — Patient Instructions (Signed)
Sleep Studies A sleep study (polysomnogram) is a series of tests done while you are sleeping. A sleep study records your brain waves, heart rate, breathing rate, oxygen level, and eye and leg movements. A sleep study helps your health care provider:  See how well you sleep.  Diagnose a sleep disorder.  Determine how severe your sleep disorder is.  Create a plan to treat your sleep disorder. Your health care provider may recommend a sleep study if you:  Feel sleepy on most days.  Snore loudly while sleeping.  Have unusual behaviors while you sleep, such as walking.  Have brief periods in which you stop breathing during sleep (sleepapnea).  Fall asleep suddenly during the day (narcolepsy).  Have trouble falling asleep or staying asleep (insomnia).  Feel like you need to move your legs when trying to fall asleep (restless legs syndrome).  Move your legs by flexing and extending them regularly while asleep (periodic limb movement disorder).  Act out your dreams while you sleep (sleep behavior disorder).  Feel like you cannot move when you first wake up (sleep paralysis). What tests are part of a sleep study? Most sleep studies record the following during sleep:  Brain activity.  Eye movements.  Heart rate and rhythm.  Breathing rate and rhythm.  Blood-oxygen level.  Blood pressure.  Chest and belly movement as you breathe.  Arm and leg movements.  Snoring or other noises.  Body position. Where are sleep studies done? Sleep studies are done at sleep centers. A sleep center may be inside a hospital, office, or clinic. The room where you have the study may look like a hospital room or a hotel room. The health care providers doing the study may come in and out of the room during the study. Most of the time, they will be in another room monitoring your test as you sleep. How are sleep studies done? Most sleep studies are done during a normal period of time for a full  night of sleep. You will arrive at the study center in the evening and go home in the morning. Before the test  Bring your pajamas and toothbrush with you to the sleep study.  Do not have caffeine on the day of your sleep study.  Do not drink alcohol on the day of your sleep study.  Your health care provider will let you know if you should stop taking any of your regular medicines before the test. During the test      Round, sticky patches with sensors attached to recording wires (electrodes) are placed on your scalp, face, chest, and limbs.  Wires from all the electrodes and sensors run from your bed to a computer. The wires can be taken off and put back on if you need to get out of bed to go to the bathroom.  A sensor is placed over your nose to measure airflow.  A finger clip is put on your finger or ear to measure your blood oxygen level (pulse oximetry).  A belt is placed around your belly and a belt is placed around your chest to measure breathing movements.  If you have signs of the sleep disorder called sleep apnea during your test, you may get a treatment mask to wear for the second half of the night. ? The mask provides positive airway pressure (PAP) to help you breathe better during sleep. This may greatly improve your sleep apnea. ? You will then have all tests done again with the mask   in place to see if your measurements and recordings change. After the test  A medical doctor who specializes in sleep will evaluate the results of your sleep study and share them with you and your primary health care provider.  Based on your results, your medical history, and a physical exam, you may be diagnosed with a sleep disorder, such as: ? Sleep apnea. ? Restless legs syndrome. ? Sleep-related behavior disorder. ? Sleep-related movement disorders. ? Sleep-related seizure disorders.  Your health care team will help determine your treatment options based on your diagnosis. This  may include: ? Improving your sleep habits (sleep hygiene). ? Wearing a continuous positive airway pressure (CPAP) or bi-level positive airway pressure (BPAP) mask. ? Wearing an oral device at night to improve breathing and reduce snoring. ? Taking medicines. Follow these instructions at home:  Take over-the-counter and prescription medicines only as told by your health care provider.  If you are instructed to use a CPAP or BPAP mask, make sure you use it nightly as directed.  Make any lifestyle changes that your health care provider recommends.  If you were given a device to open your airway while you sleep, use it only as told by your health care provider.  Do not use any tobacco products, such as cigarettes, chewing tobacco, and e-cigarettes. If you need help quitting, ask your health care provider.  Keep all follow-up visits as told by your health care provider. This is important. Summary  A sleep study (polysomnogram) is a series of tests done while you are sleeping. It shows how well you sleep.  Most sleep studies are done over one full night of sleep. You will arrive at the study center in the evening and go home in the morning.  If you have signs of the sleep disorder called sleep apnea during your test, you may get a treatment mask to wear for the second half of the night.  A medical doctor who specializes in sleep will evaluate the results of your sleep study and share them with your primary health care provider. This information is not intended to replace advice given to you by your health care provider. Make sure you discuss any questions you have with your health care provider. Document Revised: 04/07/2019 Document Reviewed: 11/18/2017 Elsevier Patient Education  2020 [employer]  

## 2020-05-02 NOTE — Progress Notes (Signed)
SLEEP MEDICINE CLINIC    Provider:  Larey Seat, MD  Primary Care Physician:  Soyla Dryer, PA-C [address] [place] [address]     Referring Provider: Soyla Dryer, [place],  [place] [address]          Chief Complaint according to patient   Patient presents with:    . New Patient (Initial Visit)     over the last several yrs he has started having problems with screaming in sleep, cursing in sleep (he says don't normally curse) jerky movements and hitting in sleep. he states that he doesn't have a problem falling asleep but never know what will happen in sleep. sometimes remembers having dreams , snores sometimes.      HISTORY OF PRESENT ILLNESS:  Edward Fisher is a 54 y.o. year old Caucasian male patient seen here upon referral on 05/02/2020 from [place], free clinic in [place].   Chief concern according to patient :   [name] has never been a sleepwalker but he has developed very vivid dreams that he and acts at night, his wife reports that she has been kicked and boxed and he has destroyed some of the home furnishings around his bed.  He screams sometimes  Loudly, in complete sentances, sometimes cussing.   He is bed is located next to the window and he pulled on window treatments and he has broken a lamp from a nightstand.  Patient reports no difficulties to fall asleep and usually to stay asleep.  The enactment of dreams seems to take place after the first hour of sleep and can cluster into the morning hours. He is extremely restless, jerking .  It occurs more than once in some nights. The enactment takes place at least once a week, there may be days of no activity in between.  His wife reports that these increasing frequencies of 3 management have been noticed for at least 4 years. He has been shaking in daytime.     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   Sleep relevant medical history: Nocturia 2-3 times , snoring, no Sleep walking. Family medical /sleep history: no other family member on CPAP with OSA, insomnia, sleep walkers.    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  The patient currently works regular hours.  Pets are present - a dog. . Tobacco use- remote s chewing tobacco use.   ETOH use - none , Caffeine intake in form of Coffee( 1 cup in AM ) Soda( not with caffiene) Tea ( some each week. ) or energy drinks. Regular exercise in form of working. No exposure to welding, gases, fumes, dusts. His hobby is gospel music- plays a mandoline.       Sleep habits are as follows: The patient's dinner time is between 5.30 PM. The patient goes to bed at 10- 11 PM and is asleep promptly , he continues to sleep for many hours, wakes for bathroom breaks, the first time at 3-4 AM.   The preferred sleep position is not defined, with the support of 2 pillows.  Dreams are reportedly frequent/vivid.  7 AM is the usual rise time. The patient wakes up spontaneously at 6.15 AM He reports not feeling refreshed or restored in AM, with symptoms such as dry mouth  and residual fatigue.  Naps are taken infrequently,only Sundays after church- lasting from 30-60 minutes and had episodes of behavior then.     Review of Systems: Out of a complete 14 system review, the patient complains of only the following symptoms, and all other reviewed systems are negative.:  Fatigue, sleepiness , wide reports some snoring, fragmented sleep, restless, kicking, yelling, boxing, crying. fallen out of bed.    How likely are you to doze in the following situations: 0 = not likely, 1 = slight chance, 2 = moderate chance, 3 =  high chance   Sitting and Reading? Watching Television? Sitting inactive in a public place (theater or meeting)? As a passenger in a car for an hour without a break? Lying down in the afternoon when circumstances permit? Sitting and talking to someone? Sitting quietly after lunch without alcohol? In a car, while stopped for a few minutes in traffic?   Total = 10/ 24 points   FSS endorsed at 52/ 63 points.   Social History   Socioeconomic History  . Marital status: Married    Spouse name: Not on file  . Number of children: Not on file  . Years of education: Not on file  . Highest education level: Not on file  Occupational History  . Occupation: Agricultural consultant: Medical illustrator AND BATH    Comment: self-employed  Tobacco Use  . Smoking status: Never Smoker  . Smokeless tobacco: Former Systems developer    Types: Chew  . Tobacco comment: 06/22/2012 "quit chewing 10-15 years ago"  Substance and Sexual Activity  . Alcohol use: No    Alcohol/week: 0.0 standard drinks  . Drug use: No  . Sexual activity: Yes  Other Topics Concern  . Not on file  Social History Narrative   Regular exercise: No   Social Determinants of Health   Financial Resource Strain:   . Difficulty of Paying Living Expenses:   Food Insecurity:   . Worried About Charity fundraiser in the Last Year:   . Arboriculturist in the Last Year:   Transportation Needs:   . Film/video editor (Medical):   [name] Lack of Transportation (Non-Medical):   Physical Activity:   . Days of Exercise per Week:   . Minutes of Exercise per Session:   Stress:   . Feeling of Stress :   Social Connections:   . Frequency of Communication with Friends and Family:   . Frequency of Social Gatherings with Friends and Family:   . Attends Religious Services:   . Active Member of Clubs or Organizations:   . Attends Archivist Meetings:   [name] Marital Status:     Family History  Problem Relation Age of Onset  .  Hypertension Mother   . Ulcers Father        Peptic ulcer disease  . Coronary artery disease Father        Stents  . Coronary artery disease Brother        CABG    Past Medical History:  Diagnosis Date  . Arteriosclerotic cardiovascular disease (ASCVD)    Onset in 04/2012; CABG in 06/2012 w/ LIMA-LAD, L Rad-CFX, SVG-PL-PDA  . Hyperlipidemia    goal LDL < 70  . Insomnia   . Nephrolithiasis  Past Surgical History:  Procedure Laterality Date  . CORONARY ARTERY BYPASS GRAFT  06/24/2012   Procedure: CORONARY ARTERY BYPASS GRAFTING (CABG);  Surgeon: [name], MD;  Location: [facility];  Service: Open Heart Surgery;  Laterality: N/A;  Coronary Artery  Bypass X 4 utilizing endoscopic saphenous vein graft and  Left radial Artery Harvest   . CYSTOSCOPY W/ RETROGRADES Left 08/23/2015   Procedure: CYSTOSCOPY WITH RETROGRADE PYELOGRAM;  Surgeon: [name], MD;  Location: [facility];  Service: Urology;  Laterality: Left;  . CYSTOSCOPY WITH URETEROSCOPY, STONE BASKETRY AND STENT PLACEMENT Left 08/23/2015   Procedure: CYSTOSCOPY WITH URETEROSCOPY, STONE BASKET EXTRACTION;  Surgeon: [name], MD;  Location: [facility];  Service: Urology;  Laterality: Left;  . HOLMIUM LASER APPLICATION Left 41/28/7867   Procedure: HOLMIUM LASER APPLICATION;  Surgeon: [name], MD;  Location: [facility];  Service: Urology;  Laterality: Left;  . LEFT HEART CATHETERIZATION WITH CORONARY ANGIOGRAM N/A 06/23/2012   Procedure: LEFT HEART CATHETERIZATION WITH CORONARY ANGIOGRAM;  Surgeon: [name], MD;  Location[facility];  Service: Cardiovascular;  Laterality: N/A;  . RHINOPLASTY    . TONSILLECTOMY  ~1971     Current Outpatient Medications on File Prior to Visit  Medication Sig Dispense Refill  . aspirin 81 MG chewable tablet Chew 81 mg by mouth every evening. TO START DOSAGE IN 6 MONTHS APPROXIMATELY July 2014    . atorvastatin (LIPITOR) 20 MG tablet Take 1 tablet by mouth once daily 30  tablet 6  . metoprolol succinate (TOPROL XL) 25 MG 24 hr tablet Take 1 tablet (25 mg total) by mouth daily. 30 tablet 11  . nitroGLYCERIN (NITROSTAT) 0.4 MG SL tablet Place 1 tablet (0.4 mg total) under the tongue every 5 (five) minutes as needed for chest pain. 25 tablet 3  . omega-3 acid ethyl esters (LOVAZA) 1 G capsule Take 1 g by mouth every evening.    [name] PARoxetine (PAXIL) 20 MG tablet Take 1 tablet by mouth once daily 30 tablet 6   No current facility-administered medications on file prior to visit.    Allergies  Allergen Reactions  . Pollen Extract Cough    Physical exam:  Today's Vitals   05/02/20 1256  BP: 128/74  Pulse: 76  Weight: 220 lb (99.8 kg)  Height: 5' 10.5" (1.791 m)   Body mass index is 31.12 kg/m.   Wt Readings from Last 3 Encounters:  05/02/20 220 lb (99.8 kg)  03/27/20 218 lb 14.4 oz (99.3 kg)  01/17/20 226 lb 9.6 oz (102.8 kg)     Ht Readings from Last 3 Encounters:  05/02/20 5' 10.5" (1.791 m)  06/17/19 5\' 10"  (1.778 m)  11/23/18 5\' 10"  (1.778 m)      General: The patient is awake, alert and appears not in acute distress. The patient is well groomed. Head: Normocephalic, atraumatic. Neck is supple. Mallampati 2,  neck circumference:17. 5 inches .  Nasal airflow patent. He has a deviated nasal septum-  Retrognathia is not seen.  Dental status: N/A. Cardiovascular:  Regular rate and cardiac rhythm by pulse, without distended neck veins. Respiratory: Lungs are clear to auscultation.  Skin:  Without evidence of ankle edema, or rash. Left antibrachial artery was a harvested for Coronary bypass graft.   Trunk: The patient's posture is erect.   Neurologic exam : The patient is awake and alert, oriented to place and time.   Memory subjective described as intact.  Attention span & concentration ability appears  normal.  Speech is fluent,  without  dysarthria, but mild dysphonia- soft spoken.  Mood and affect are appropriate.   Cranial nerves: no  loss of smell or taste reported  Pupils are equal and briskly reactive to light. Funduscopic exam deferred. but left eye lifts with horizontal gaze, left didn't.  , no tongue tremors.   Extraocular movements in vertical and horizontal planes were intact and without nystagmus. No Diplopia. Visual fields by finger perimetry are intact. Hearing was intact to soft voice and finger rubbing.   Facial sensation intact to fine touch. Facial motor strength is symmetric and tongue and uvula move midline.  Neck ROM : rotation, tilt and flexion extension were normal for age and shoulder shrug was symmetrical.    Motor exam:  Symmetric bulk, tone and ROM. He feels a little clumsy in the right had and arm, and right foot.  Normal tone without cog- wheeling, symmetric grip strength , he appears tremulous. .   Sensory:  Fine touch, pinprick and vibration were normal.  Proprioception tested in the upper extremities was normal.   Coordination: Rapid alternating movements in the fingers/hands were on the right side of reduced speed. Clearly affected handwriting. There is clearly a pronator drift on the right. Tremor.   The Finger-to-nose maneuver was intact on the left- slowed ,but not ataxic or dysmetric.   Gait and station: Patient could rise unassisted from a seated position, walked without assistive device. He has placed his rigt foot outwards, and lost right sided arm swing, he looks stiff when he turned , took 4 steps.  Stance is of normal width/ base and the patient turned with 4 steps turning to the  left .  Toe and heel walk were deferred.  Deep tendon reflexes: in the  upper and lower extremities are symmetrically attenuated. Babinski response was deferred.     EdwardFisher has developed what appears to be a REM sleep dependent behavior over the last for 5 years and maybe even longer but it has become more frequent recently.  The enactment is more more vivid sometimes he sits up and just yells but goes  back to sleep after he takes or boxes, sometimes this happens even more than once in the night and this has happened after waking from a nap or during a nap as well.   After spending a total time of 50 minutes face to face and additional time for physical and neurologic examination, review of laboratory studies,  personal review of imaging studies, reports and results of other testing and review of referral information / records as far as provided in visit, I have established the following assessments:  1) Highly suspicious for REM BD. Underlying causes can be PD, lewy body disorder.   2) right sided stiffness, coordination, handwriting changes, slowed movement and loss of arm swing- but no cogwheeling.  3) normal eye movements- but left eye lifts with horizontal gaze, left didn't.  , no tongue tremors.     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  I would like to capture one of these events and this cannot be done in a home sleep test. 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  He is highly fatigued often feels unrested in the morning and this can develop  be related to the interrupted and fragmented sleep.  Is also hard on his wife.  The clumsiness that he has noticed his right side which is his dominant hand so his penmanship has changed.  I would like to make sure that he does not have any vascular brain events namely strokes and would order an MRI with and without contrast for him.  If there is no clear indication of a vascular origin we would proceed to a DAT scan DAT scan.  In the meantime I will asked him to try melatonin at night as it can suppress some dream activity and his wife just told me that he tried it and failed.   The next step would be Klonopin.  Klonopin is sedative  and he should not take it if he would have to operate machinery with an 7 to 8 hours after taking the medication so it would be worthwhile starting daily.  I looked at the current medications and I am not concerned that these are fostering any of his movement disorder aspirin, Lipitor, Toprol, Nitrostat, Lovaza fish oil he is on Paxil Paxil gives some people restless legs but it is usually not known to give this kind of behavior manifestation.   I would like to thank Charlott Holler and Soyla Dryer, [place],  [place] [address] for allowing me to meet with and to take care of this pleasant patient.   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    Electronically signed by: Larey Seat, MD 05/02/2020 1:13 PM  [facility] Board certified by The [employer] of Sleep Medicine and Diplomate of the [employer] of Sleep Medicine. Board certified In Neurology through the [employer], Fellow of the [employer] of Neurology. Medical Director of [facility].

## 2020-05-02 NOTE — Telephone Encounter (Signed)
75% cone assistance letter order sent to GI. They will reach out to the patient to schedule.

## 2020-05-11 ENCOUNTER — Encounter: Payer: Self-pay | Admitting: Neurology

## 2020-05-11 ENCOUNTER — Telehealth: Payer: Self-pay

## 2020-05-11 MED ORDER — ALPRAZOLAM 0.5 MG PO TABS: ORAL_TABLET | ORAL | 0 refills | Status: DC

## 2020-05-11 NOTE — Addendum Note (Signed)
Addended by: Darleen Crocker on: 05/11/2020 11:59 AM   Modules accepted: Orders

## 2020-05-11 NOTE — Telephone Encounter (Signed)
Orders sent to Dr Brett Fairy to review and send to pharmacy if she agrees.

## 2020-05-11 NOTE — Telephone Encounter (Signed)
Pt calling requesting a xanax for bmri

## 2020-05-11 NOTE — Addendum Note (Signed)
Addended by: Larey Seat on: 05/11/2020 02:21 PM   Modules accepted: Orders

## 2020-05-15 ENCOUNTER — Ambulatory Visit
Admission: RE | Admit: 2020-05-15 | Discharge: 2020-05-15 | Disposition: A | Discharge status: Home / Self Care | Payer: No Typology Code available for payment source | Source: Ambulatory Visit | Attending: Neurology | Admitting: Neurology

## 2020-05-15 DIAGNOSIS — G8191 Hemiplegia, unspecified affecting right dominant side: Secondary | ICD-10-CM

## 2020-05-15 DIAGNOSIS — R251 Tremor, unspecified: Secondary | ICD-10-CM

## 2020-05-15 DIAGNOSIS — G4752 REM sleep behavior disorder: Secondary | ICD-10-CM

## 2020-05-15 DIAGNOSIS — I2510 Atherosclerotic heart disease of native coronary artery without angina pectoris: Secondary | ICD-10-CM

## 2020-05-15 MED ORDER — GADOBENATE DIMEGLUMINE 529 MG/ML IV SOLN: 20.0000 mL | Freq: Once | INTRAVENOUS | Status: DC | PRN

## 2020-05-15 MED ORDER — GADOBENATE DIMEGLUMINE 529 MG/ML IV SOLN
20.0000 mL | Freq: Once | INTRAVENOUS | Status: AC | PRN
  Administered 2020-05-15: 20 mL via INTRAVENOUS

## 2020-05-18 ENCOUNTER — Other Ambulatory Visit: Payer: Self-pay

## 2020-05-18 ENCOUNTER — Telehealth: Payer: Self-pay | Admitting: Neurology

## 2020-05-18 ENCOUNTER — Ambulatory Visit (INDEPENDENT_AMBULATORY_CARE_PROVIDER_SITE_OTHER): Payer: Self-pay | Admitting: Neurology

## 2020-05-18 DIAGNOSIS — R251 Tremor, unspecified: Secondary | ICD-10-CM

## 2020-05-18 DIAGNOSIS — G4752 REM sleep behavior disorder: Secondary | ICD-10-CM

## 2020-05-18 NOTE — Telephone Encounter (Signed)
Called the patient to review the MRI results. Reviewed in detail with him that study. Patient was scheduled for a new consult visit on 8/26 and he states this was pretty much addressed in the other visit. At this time we will cancel that upcoming apt and then we will call him once Dr Dohmeier reviews the sleep study and make follow up visits at that time. Pt verbalized understanding. Pt had no questions at this time but was encouraged to call back if questions arise.

## 2020-05-18 NOTE — Telephone Encounter (Signed)
-----   Message from Britt Bottom, MD sent at 05/16/2020  7:21 PM EDT ----- Please let him know that the MRI of the brain showed some small spots that are likely age-related and more likely to occur in patients who have elevated blood pressure.  There was mild chronic sinusitis.  If he has any sinusitis symptoms, please send in a Z-Pak.

## 2020-05-25 ENCOUNTER — Other Ambulatory Visit: Payer: Self-pay

## 2020-05-25 ENCOUNTER — Emergency Department (HOSPITAL_COMMUNITY)
Admission: EM | Admit: 2020-05-25 | Discharge: 2020-05-25 | Disposition: A | Discharge status: Home / Self Care | Payer: HRSA Program | Attending: Emergency Medicine | Admitting: Emergency Medicine

## 2020-05-25 ENCOUNTER — Emergency Department (HOSPITAL_COMMUNITY): Payer: HRSA Program

## 2020-05-25 ENCOUNTER — Telehealth: Payer: Self-pay | Admitting: Nurse Practitioner

## 2020-05-25 ENCOUNTER — Encounter (HOSPITAL_COMMUNITY): Payer: Self-pay

## 2020-05-25 DIAGNOSIS — Z87891 Personal history of nicotine dependence: Secondary | ICD-10-CM | POA: Diagnosis not present

## 2020-05-25 DIAGNOSIS — Z951 Presence of aortocoronary bypass graft: Secondary | ICD-10-CM | POA: Insufficient documentation

## 2020-05-25 DIAGNOSIS — U071 COVID-19: Secondary | ICD-10-CM

## 2020-05-25 DIAGNOSIS — I10 Essential (primary) hypertension: Secondary | ICD-10-CM | POA: Insufficient documentation

## 2020-05-25 DIAGNOSIS — Z7982 Long term (current) use of aspirin: Secondary | ICD-10-CM | POA: Insufficient documentation

## 2020-05-25 DIAGNOSIS — Z79899 Other long term (current) drug therapy: Secondary | ICD-10-CM | POA: Insufficient documentation

## 2020-05-25 DIAGNOSIS — R630 Anorexia: Secondary | ICD-10-CM | POA: Insufficient documentation

## 2020-05-25 DIAGNOSIS — R0602 Shortness of breath: Secondary | ICD-10-CM | POA: Diagnosis present

## 2020-05-25 LAB — CBC WITH DIFFERENTIAL/PLATELET
Abs Immature Granulocytes: 0.01 10*3/uL (ref 0.00–0.07)
Basophils Absolute: 0 10*3/uL (ref 0.0–0.1)
Basophils Relative: 1 %
Eosinophils Absolute: 0 10*3/uL (ref 0.0–0.5)
Eosinophils Relative: 0 %
HCT: 50 % (ref 39.0–52.0)
Hemoglobin: 16.2 g/dL (ref 13.0–17.0)
Immature Granulocytes: 0 %
Lymphocytes Relative: 22 %
Lymphs Abs: 0.7 10*3/uL (ref 0.7–4.0)
MCH: 29.3 pg (ref 26.0–34.0)
MCHC: 32.4 g/dL (ref 30.0–36.0)
MCV: 90.4 fL (ref 80.0–100.0)
Monocytes Absolute: 0.4 10*3/uL (ref 0.1–1.0)
Monocytes Relative: 13 %
Neutro Abs: 2.2 10*3/uL (ref 1.7–7.7)
Neutrophils Relative %: 64 %
Platelets: 123 10*3/uL — ABNORMAL LOW (ref 150–400)
RBC: 5.53 MIL/uL (ref 4.22–5.81)
RDW: 12.7 % (ref 11.5–15.5)
WBC: 3.4 10*3/uL — ABNORMAL LOW (ref 4.0–10.5)
nRBC: 0 % (ref 0.0–0.2)

## 2020-05-25 LAB — COMPREHENSIVE METABOLIC PANEL
ALT: 100 U/L — ABNORMAL HIGH (ref 0–44)
AST: 64 U/L — ABNORMAL HIGH (ref 15–41)
Albumin: 4.3 g/dL (ref 3.5–5.0)
Alkaline Phosphatase: 63 U/L (ref 38–126)
Anion gap: 12 (ref 5–15)
BUN: 17 mg/dL (ref 6–20)
CO2: 21 mmol/L — ABNORMAL LOW (ref 22–32)
Calcium: 9.2 mg/dL (ref 8.9–10.3)
Chloride: 101 mmol/L (ref 98–111)
Creatinine, Ser: 1.11 mg/dL (ref 0.61–1.24)
GFR calc Af Amer: >60 mL/min (ref >60)
GFR calc non Af Amer: >60 mL/min (ref >60)
Glucose, Bld: 140 mg/dL — ABNORMAL HIGH (ref 70–99)
Potassium: 4 mmol/L (ref 3.5–5.1)
Sodium: 134 mmol/L — ABNORMAL LOW (ref 135–145)
Total Bilirubin: 0.5 mg/dL (ref 0.3–1.2)
Total Protein: 8 g/dL (ref 6.5–8.1)

## 2020-05-25 LAB — SARS CORONAVIRUS 2 BY RT PCR (HOSPITAL ORDER, PERFORMED IN ~~LOC~~ HOSPITAL LAB): SARS Coronavirus 2: POSITIVE — AB

## 2020-05-25 IMAGING — DX DG CHEST 1V PORT
1 series · 1 of 1 positions shown · non-contrast
Comparison: [DATE].

CLINICAL DATA: COVID.

EXAM:
PORTABLE CHEST 1 VIEW

[chest ap]
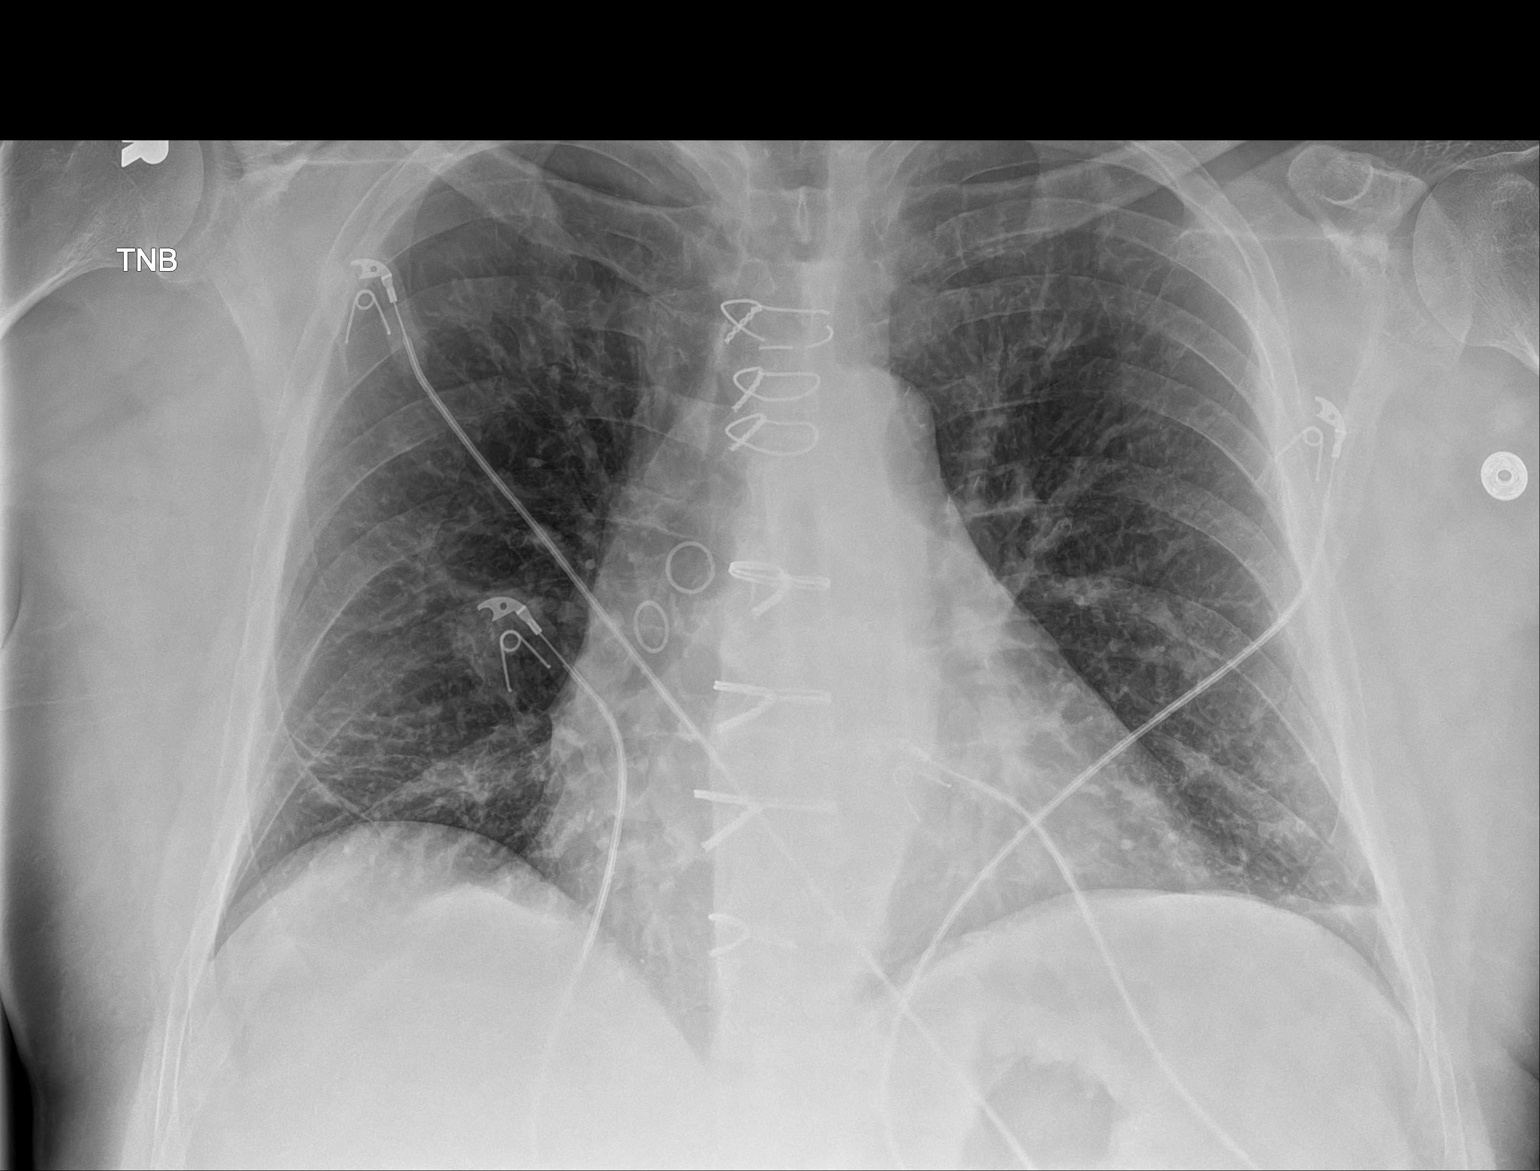

[1 of 1 positions shown; findings below may reference images not displayed]

FINDINGS: Prior CABG. Cardiomegaly. No pulmonary venous congestion. Low lung
volumes. Bibasilar atelectasis/infiltrates. No pleural effusion or
pneumothorax.
IMPRESSION: 1.  Bibasilar atelectasis/infiltrates in this known COVID patient.

2.  Prior CABG.  Cardiomegaly.  No pulmonary venous congestion.

## 2020-05-25 MED ORDER — ACETAMINOPHEN 500 MG PO TABS: 1000.0000 mg | ORAL_TABLET | Freq: Once | ORAL | Status: DC

## 2020-05-25 MED ORDER — LORAZEPAM 2 MG/ML IJ SOLN
1.0000 mg | Freq: Once | INTRAMUSCULAR | Status: AC
  Administered 2020-05-25: 1 mg via INTRAVENOUS
  Filled 2020-05-25: qty 1

## 2020-05-25 MED ORDER — SODIUM CHLORIDE 0.9 % IV BOLUS
1000.0000 mL | Freq: Once | INTRAVENOUS | Status: AC
  Administered 2020-05-25: 1000 mL via INTRAVENOUS

## 2020-05-25 MED ORDER — IBUPROFEN 400 MG PO TABS
600.0000 mg | ORAL_TABLET | Freq: Once | ORAL | Status: AC
  Administered 2020-05-25: 600 mg via ORAL
  Filled 2020-05-25: qty 2

## 2020-05-25 NOTE — ED Triage Notes (Signed)
Pt reports covid positive since Friday.  C/O cough, weakness, sore throat, headache, and body aches.

## 2020-05-25 NOTE — ED Notes (Signed)
Pt has been completely discharged. He was given Ativan and is very drowsy. Once he awakens more, he will walk out. Charge nurse notified.

## 2020-05-25 NOTE — Discharge Instructions (Addendum)
Isolate at home until you are symptom-free +1-week.  You are to follow-up in the monoclonal antibody clinic for infusion on Saturday.  They have you scheduled for 830.  They will call you today or tomorrow to arrange the specifics.  Return to the ER if you develop chest pain or difficulty breathing.

## 2020-05-25 NOTE — ED Provider Notes (Signed)
[facility] Provider Note   CSN: [id number] Arrival date & time: 05/25/20  4650     History Chief Complaint  Patient presents with  . Covid    Edward Fisher is a 54 y.o. male.  Patient is a 54 year old male with past medical history of coronary artery disease status post CABG in 2015.  He presents today for evaluation of URI like symptoms.  Patient tested positive for COVID-19 at home this past weekend.  Since then he has felt weak, short of breath, and had little energy.  He is also reporting decreased appetite.  He is here with his wife who is also tested positive for COVID-19.  He denies to me he is having any chest pain.  The history is provided by the patient.       Past Medical History:  Diagnosis Date  . Arteriosclerotic cardiovascular disease (ASCVD)    Onset in 04/2012; CABG in 06/2012 w/ LIMA-LAD, L Rad-CFX, SVG-PL-PDA  . Hyperlipidemia    goal LDL < 70  . Insomnia   . Nephrolithiasis     Patient Active Problem List   Diagnosis Date Noted  . Tremor of right hand 05/02/2020  . Uncontrolled REM sleep behavior disorder 05/02/2020  . Hemiparesis of right dominant side ([place]) 05/02/2020  . Chest pain 06/22/2014  . Essential hypertension 06/22/2014  . Belching 06/22/2014  . Local skin infection 03/16/2013  . Arteriosclerotic cardiovascular disease (ASCVD) 06/26/2012  . Hyperlipidemia     Past Surgical History:  Procedure Laterality Date  . CORONARY ARTERY BYPASS GRAFT  06/24/2012   Procedure: CORONARY ARTERY BYPASS GRAFTING (CABG);  Surgeon: [name], MD;  Location: [facility];  Service: Open Heart Surgery;  Laterality: N/A;  Coronary Artery  Bypass X 4 utilizing endoscopic saphenous vein graft and  Left radial Artery Harvest   . CYSTOSCOPY W/ RETROGRADES Left 08/23/2015   Procedure: CYSTOSCOPY WITH RETROGRADE PYELOGRAM;  Surgeon: [name], MD;  Location: [facility];  Service: Urology;  Laterality: Left;  . CYSTOSCOPY WITH  URETEROSCOPY, STONE BASKETRY AND STENT PLACEMENT Left 08/23/2015   Procedure: CYSTOSCOPY WITH URETEROSCOPY, STONE BASKET EXTRACTION;  Surgeon: [name], MD;  Location: [facility];  Service: Urology;  Laterality: Left;  . HOLMIUM LASER APPLICATION Left 35/46/5681   Procedure: HOLMIUM LASER APPLICATION;  Surgeon: [name], MD;  Location: [facility];  Service: Urology;  Laterality: Left;  . LEFT HEART CATHETERIZATION WITH CORONARY ANGIOGRAM N/A 06/23/2012   Procedure: LEFT HEART CATHETERIZATION WITH CORONARY ANGIOGRAM;  Surgeon: [name], MD;  Location: [facility] CATH LAB;  Service: Cardiovascular;  Laterality: N/A;  . RHINOPLASTY    . TONSILLECTOMY  ~1971       Family History  Problem Relation Age of Onset  . Hypertension Mother   . Ulcers Father        Peptic ulcer disease  . Coronary artery disease Father        Stents  . Coronary artery disease Brother        CABG    Social History   Tobacco Use  . Smoking status: Never Smoker  . Smokeless tobacco: Former Systems developer    Types: Chew  . Tobacco comment: 06/22/2012 "quit chewing 10-15 years ago"  Substance Use Topics  . Alcohol use: No    Alcohol/week: 0.0 standard drinks  . Drug use: No    Home Medications Prior to Admission medications   Medication Sig Start Date End Date Taking? Authorizing Provider  aspirin 81 MG  chewable tablet Chew 81 mg by mouth every evening. TO START DOSAGE IN 6 MONTHS APPROXIMATELY July 2014   Yes [provider]  atorvastatin (LIPITOR) 20 MG tablet Take 1 tablet by mouth once daily 10/18/19  Yes Soyla Dryer, PA-C  metoprolol succinate (TOPROL XL) 25 MG 24 hr tablet Take 1 tablet (25 mg total) by mouth daily. 07/01/19  Yes Barrett, Evelene Croon, PA-C  nitroGLYCERIN (NITROSTAT) 0.4 MG SL tablet Place 1 tablet (0.4 mg total) under the tongue every 5 (five) minutes as needed for chest pain. 05/13/17  Yes Herminio Commons, MD  omega-3 acid ethyl esters (LOVAZA) 1 G capsule Take 1 g by  mouth every evening.   Yes [provider]  PARoxetine (PAXIL) 20 MG tablet Take 1 tablet by mouth once daily 11/09/19  Yes Soyla Dryer, PA-C  clonazePAM (KLONOPIN) 0.5 MG tablet Take 1 tablet (0.5 mg total) by mouth at bedtime. Patient not taking: Reported on 05/25/2020 05/02/20   Dohmeier, Asencion Partridge, MD    Allergies    Pollen extract  Review of Systems   Review of Systems  All other systems reviewed and are negative.   Physical Exam Updated Vital Signs BP (!) 129/79   Pulse 93   Temp 98.7 F (37.1 C) (Oral)   Resp 16   Ht 5\' 10"  (1.778 m)   Wt 99.8 kg   SpO2 97%   BMI 31.57 kg/m   Physical Exam Vitals and nursing note reviewed.  Constitutional:      General: He is not in acute distress.    Appearance: He is well-developed. He is not diaphoretic.  HENT:     Head: Normocephalic and atraumatic.  Cardiovascular:     Rate and Rhythm: Normal rate and regular rhythm.     Heart sounds: No murmur heard.  No friction rub.  Pulmonary:     Effort: Pulmonary effort is normal. No respiratory distress.     Breath sounds: Normal breath sounds. No wheezing or rales.  Abdominal:     General: Bowel sounds are normal. There is no distension.     Palpations: Abdomen is soft.     Tenderness: There is no abdominal tenderness.  Musculoskeletal:        General: Normal range of motion.     Cervical back: Normal range of motion and neck supple.  Skin:    General: Skin is warm and dry.  Neurological:     Mental Status: He is alert and oriented to person, place, and time.     Coordination: Coordination normal.     ED Results / Procedures / Treatments   Labs (all labs ordered are listed, but only abnormal results are displayed) Labs Reviewed  CBC WITH DIFFERENTIAL/PLATELET - Abnormal; Notable for the following components:      Result Value   WBC 3.4 (*)    Platelets 123 (*)    All other components within normal limits  COMPREHENSIVE METABOLIC PANEL - Abnormal; Notable for  the following components:   Sodium 134 (*)    CO2 21 (*)    Glucose, Bld 140 (*)    AST 64 (*)    ALT 100 (*)    All other components within normal limits  SARS CORONAVIRUS 2 BY RT PCR [facility] ORDER, [place] LAB)    EKG EKG Interpretation  Date/Time:  Thursday May 25 2020 08:35:41 EDT Ventricular Rate:  93 PR Interval:    QRS Duration: 89 QT Interval:  330 QTC  Calculation: 411 R Axis:   -42 Text Interpretation: Sinus tachycardia Probable left atrial enlargement Nonspecific T wave abnormality Baseline wander in lead(s) V2 No significant change since 08/22/2015 Confirmed by Veryl Speak [id number]) on 05/25/2020 8:46:41 AM   Radiology DG Chest Portable 1 View  Result Date: 05/25/2020 CLINICAL DATA:  COVID. EXAM: PORTABLE CHEST 1 VIEW COMPARISON:  03/17/2013. FINDINGS: Prior CABG. Cardiomegaly. No pulmonary venous congestion. Low lung volumes. Bibasilar atelectasis/infiltrates. No pleural effusion or pneumothorax. IMPRESSION: 1.  Bibasilar atelectasis/infiltrates in this known COVID patient. 2.  Prior CABG.  Cardiomegaly.  No pulmonary venous congestion. Electronically Signed   By: Marcello Moores  Register   On: 05/25/2020 09:37    Procedures Procedures (including critical care time)  Medications Ordered in ED Medications  acetaminophen (TYLENOL) tablet 1,000 mg (1,000 mg Oral Not Given 05/25/20 1049)  sodium chloride 0.9 % bolus 1,000 mL (1,000 mLs Intravenous New Bag/Given 05/25/20 1014)  ibuprofen (ADVIL) tablet 600 mg (600 mg Oral Given 05/25/20 1120)  LORazepam (ATIVAN) injection 1 mg (1 mg Intravenous Given 05/25/20 1230)    ED Course  I have reviewed the triage vital signs and the nursing notes.  Pertinent labs & imaging results that were available during my care of the patient were reviewed by me and considered in my medical decision making (see chart for details).    MDM Rules/Calculators/A&P  Patient presenting here with Covid-like symptoms after  testing positive over the weekend.  His wife is also here with similar symptoms.  Patient's work-up is unremarkable with the exception of atelectasis/infiltrates on chest x-ray.  There is no hypoxia.  Arrangements will be made for the patient to have monoclonal antibody infusion.  Patient's vitals are stable and without hypoxia, I see no indication for admission.  Patient to be discharged and follow-up in the mab clinic.  Final Clinical Impression(s) / ED Diagnoses Final diagnoses:  None    Rx / DC Orders ED Discharge Orders    None       Veryl Speak, MD 05/25/20 1236

## 2020-05-25 NOTE — Telephone Encounter (Signed)
Called to Discuss with patient about Covid symptoms and the use of bamlanivimab, a monoclonal antibody infusion for those with mild to moderate Covid symptoms and at a high risk of hospitalization.     Pt is qualified for this infusion at the [facility] infusion center due to co-morbid conditions and/or a member of an at-risk group.     Unable to reach pt  

## 2020-05-26 ENCOUNTER — Other Ambulatory Visit: Payer: Self-pay | Admitting: Nurse Practitioner

## 2020-05-26 DIAGNOSIS — U071 COVID-19: Secondary | ICD-10-CM

## 2020-05-26 DIAGNOSIS — I10 Essential (primary) hypertension: Secondary | ICD-10-CM

## 2020-05-26 DIAGNOSIS — Z6831 Body mass index (BMI) 31.0-31.9, adult: Secondary | ICD-10-CM

## 2020-05-26 DIAGNOSIS — E6609 Other obesity due to excess calories: Secondary | ICD-10-CM

## 2020-05-26 MED ORDER — SODIUM CHLORIDE 0.9 % IV SOLN
Freq: Once | INTRAVENOUS | Status: AC
  Filled 2020-05-26: qty 600

## 2020-05-26 NOTE — Progress Notes (Signed)
I connected by phone with Edward Fisher on 05/26/2020 at 8:19 AM to discuss the potential use of a new treatment for mild to moderate COVID-19 viral infection in non-hospitalized patients.  This patient is a 54 y.o. male that meets the FDA criteria for Emergency Use Authorization of COVID monoclonal antibody casirivimab/imdevimab.  Has a (+) direct SARS-CoV-2 viral test result  Has mild or moderate COVID-19   Is NOT hospitalized due to COVID-19  Is within 10 days of symptom onset  Has at least one of the high risk factor(s) for progression to severe COVID-19 and/or hospitalization as defined in EUA.  Specific high risk criteria : Cardiovascular disease or hypertension   I have spoken and communicated the following to the patient or parent/caregiver regarding COVID monoclonal antibody treatment:  1. FDA has authorized the emergency use for the treatment of mild to moderate COVID-19 in adults and pediatric patients with positive results of direct SARS-CoV-2 viral testing who are 52 years of age and older weighing at least 40 kg, and who are at high risk for progressing to severe COVID-19 and/or hospitalization.  2. The significant known and potential risks and benefits of COVID monoclonal antibody, and the extent to which such potential risks and benefits are unknown.  3. Information on available alternative treatments and the risks and benefits of those alternatives, including clinical trials.  4. Patients treated with COVID monoclonal antibody should continue to self-isolate and use infection control measures (e.g., wear mask, isolate, social distance, avoid sharing personal items, clean and disinfect "high touch" surfaces, and frequent handwashing) according to CDC guidelines.   5. The patient or parent/caregiver has the option to accept or refuse COVID monoclonal antibody treatment.  After reviewing this information with the patient, The patient agreed to proceed with receiving  casirivimab\imdevimab infusion and will be provided a copy of the Fact sheet prior to receiving the infusion. [name] 05/26/2020 8:19 AM

## 2020-05-27 ENCOUNTER — Ambulatory Visit (HOSPITAL_COMMUNITY)
Admission: RE | Admit: 2020-05-27 | Discharge: 2020-05-27 | Disposition: A | Discharge status: Home / Self Care | Payer: HRSA Program | Source: Ambulatory Visit | Attending: Pulmonary Disease | Admitting: Pulmonary Disease

## 2020-05-27 DIAGNOSIS — E6609 Other obesity due to excess calories: Secondary | ICD-10-CM | POA: Insufficient documentation

## 2020-05-27 DIAGNOSIS — Z6831 Body mass index (BMI) 31.0-31.9, adult: Secondary | ICD-10-CM | POA: Insufficient documentation

## 2020-05-27 DIAGNOSIS — Z23 Encounter for immunization: Secondary | ICD-10-CM | POA: Diagnosis present

## 2020-05-27 DIAGNOSIS — I10 Essential (primary) hypertension: Secondary | ICD-10-CM

## 2020-05-27 DIAGNOSIS — U071 COVID-19: Secondary | ICD-10-CM | POA: Diagnosis not present

## 2020-05-27 MED ORDER — METHYLPREDNISOLONE SODIUM SUCC 125 MG IJ SOLR: 125.0000 mg | Freq: Once | INTRAMUSCULAR | Status: DC | PRN

## 2020-05-27 MED ORDER — ALBUTEROL SULFATE HFA 108 (90 BASE) MCG/ACT IN AERS: 2.0000 | INHALATION_SPRAY | Freq: Once | RESPIRATORY_TRACT | Status: DC | PRN

## 2020-05-27 MED ORDER — SODIUM CHLORIDE 0.9 % IV SOLN: INTRAVENOUS | Status: DC | PRN

## 2020-05-27 MED ORDER — DIPHENHYDRAMINE HCL 50 MG/ML IJ SOLN: 50.0000 mg | Freq: Once | INTRAMUSCULAR | Status: DC | PRN

## 2020-05-27 MED ORDER — FAMOTIDINE IN NACL 20-0.9 MG/50ML-% IV SOLN: 20.0000 mg | Freq: Once | INTRAVENOUS | Status: DC | PRN

## 2020-05-27 MED ORDER — EPINEPHRINE 0.3 MG/0.3ML IJ SOAJ: 0.3000 mg | Freq: Once | INTRAMUSCULAR | Status: DC | PRN

## 2020-05-27 NOTE — Discharge Instructions (Signed)

## 2020-05-27 NOTE — Progress Notes (Signed)
  Diagnosis: COVID-19  Physician: Dr. Asencion Noble Procedure: Covid Infusion Clinic Med: casirivimab\imdevimab infusion - Provided patient with casirivimab\imdevimab fact sheet for patients, parents and caregivers prior to infusion.  Complications: No immediate complications noted.  Discharge: Discharged home   Edward Fisher 05/27/2020

## 2020-05-27 NOTE — Progress Notes (Incomplete)
   Subjective:    Patient ID: Edward Fisher, male    DOB: Dec 06, 1965, 54 y.o.   MRN: [id number]  HPI    Review of Systems     Objective:   Physical Exam        Assessment & Plan:

## 2020-05-29 ENCOUNTER — Emergency Department (HOSPITAL_COMMUNITY): Payer: No Typology Code available for payment source

## 2020-05-29 ENCOUNTER — Encounter (HOSPITAL_COMMUNITY): Payer: Self-pay | Admitting: Emergency Medicine

## 2020-05-29 ENCOUNTER — Other Ambulatory Visit: Payer: Self-pay

## 2020-05-29 ENCOUNTER — Emergency Department (HOSPITAL_COMMUNITY)
Admission: EM | Admit: 2020-05-29 | Discharge: 2020-05-29 | Disposition: A | Discharge status: Home / Self Care | Payer: No Typology Code available for payment source | Attending: Emergency Medicine | Admitting: Emergency Medicine

## 2020-05-29 DIAGNOSIS — W08XXXA Fall from other furniture, initial encounter: Secondary | ICD-10-CM | POA: Insufficient documentation

## 2020-05-29 DIAGNOSIS — I10 Essential (primary) hypertension: Secondary | ICD-10-CM | POA: Insufficient documentation

## 2020-05-29 DIAGNOSIS — F17220 Nicotine dependence, chewing tobacco, uncomplicated: Secondary | ICD-10-CM | POA: Insufficient documentation

## 2020-05-29 DIAGNOSIS — Z7982 Long term (current) use of aspirin: Secondary | ICD-10-CM | POA: Insufficient documentation

## 2020-05-29 DIAGNOSIS — I2510 Atherosclerotic heart disease of native coronary artery without angina pectoris: Secondary | ICD-10-CM | POA: Insufficient documentation

## 2020-05-29 DIAGNOSIS — Y9289 Other specified places as the place of occurrence of the external cause: Secondary | ICD-10-CM | POA: Insufficient documentation

## 2020-05-29 DIAGNOSIS — Y999 Unspecified external cause status: Secondary | ICD-10-CM | POA: Insufficient documentation

## 2020-05-29 DIAGNOSIS — S4352XA Sprain of left acromioclavicular joint, initial encounter: Secondary | ICD-10-CM

## 2020-05-29 DIAGNOSIS — Z955 Presence of coronary angioplasty implant and graft: Secondary | ICD-10-CM | POA: Insufficient documentation

## 2020-05-29 DIAGNOSIS — Y9384 Activity, sleeping: Secondary | ICD-10-CM | POA: Insufficient documentation

## 2020-05-29 IMAGING — DX DG SHOULDER 2+V*L*
2 series · 2 of 2 positions shown · non-contrast
Comparison: None.

CLINICAL DATA: Fall

EXAM:
LEFT SHOULDER - 2+ VIEW

[shoulder axial]
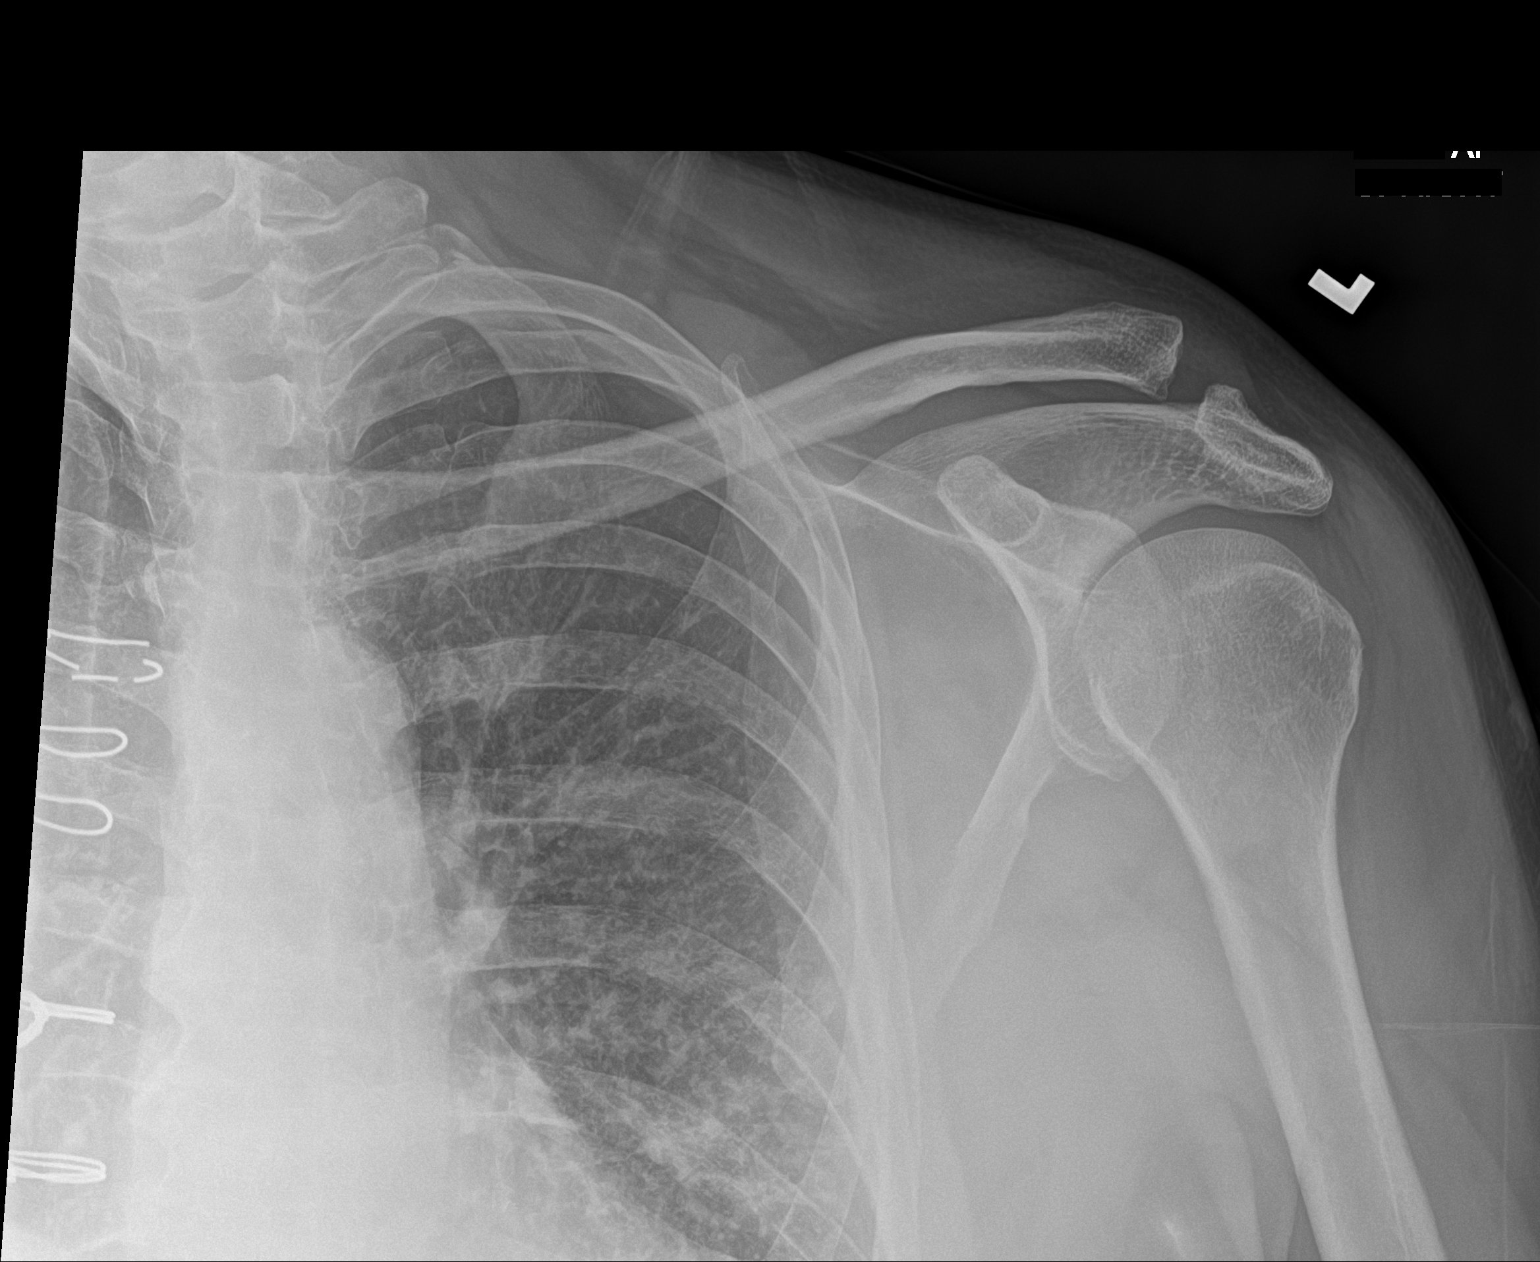

[shoulder ap]
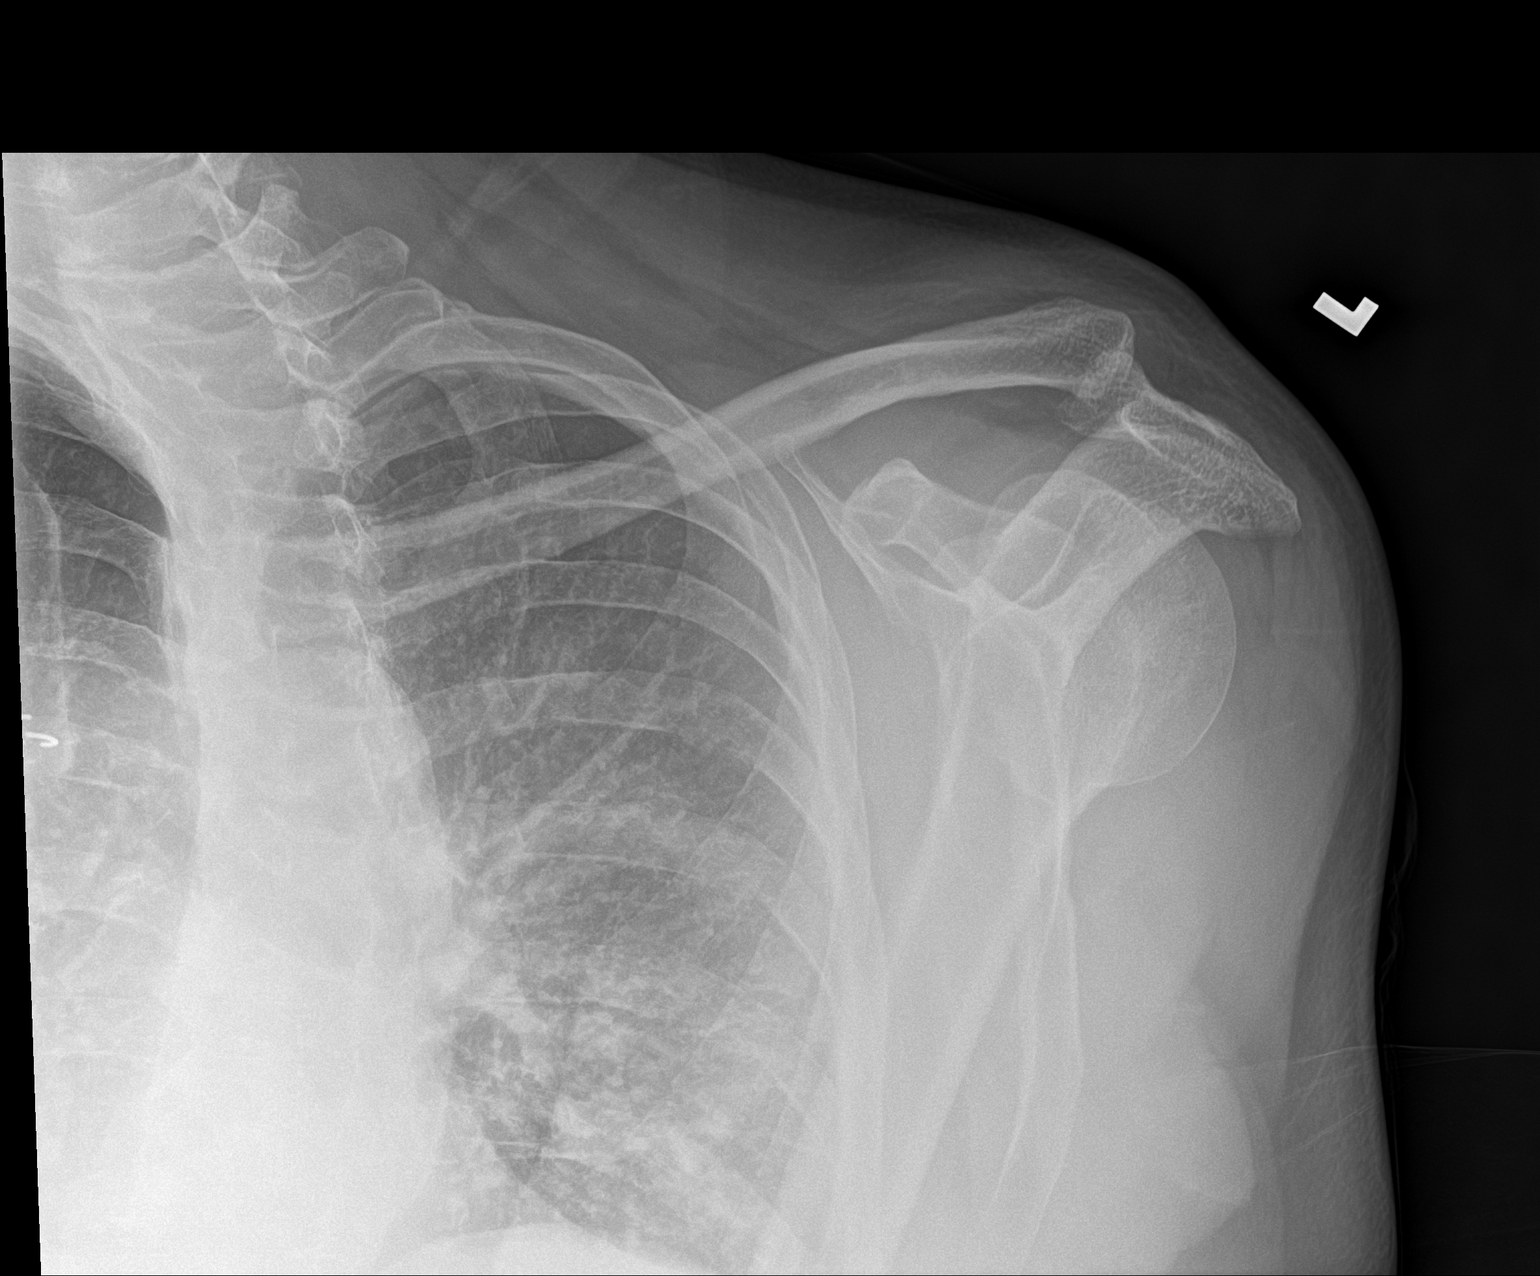

[2 of 2 positions shown; findings below may reference images not displayed]

FINDINGS: There is anatomic alignment at the glenohumeral joint. No acute
fracture. Joint space is preserved. Question of mild widening of the
left acromioclavicular joint with possible mild superior offset of
the clavicle.
IMPRESSION: No acute fracture.  Question left AC joint injury.

## 2020-05-29 NOTE — ED Triage Notes (Signed)
PT states dx with covid on 05/19/2020 and states he was sleeping on his sofa last night and fell off and landed on his left shoulder. PT denies any SOB or worsening in other covid symptoms and wants evaluated for left should pain.

## 2020-05-29 NOTE — ED Provider Notes (Signed)
[facility] Provider Note   CSN: [id number] Arrival date & time: 05/29/20  4818     History Chief Complaint  Patient presents with  . Fall  . Covid Exposure    Edward Fisher is a 54 y.o. male.  HPI Patient presents after a fall off the couch.  States he was sleeping on the couch last night with the dog and fell landing on his left shoulder.  Now has left shoulder pain.  No other injury.  No head or neck pain.  Not on anticoagulation.  Patient does currently have COVID-19 however.  Recently received antibiotic infusion.    Past Medical History:  Diagnosis Date  . Arteriosclerotic cardiovascular disease (ASCVD)    Onset in 04/2012; CABG in 06/2012 w/ LIMA-LAD, L Rad-CFX, SVG-PL-PDA  . Hyperlipidemia    goal LDL < 70  . Insomnia   . Nephrolithiasis     Patient Active Problem List   Diagnosis Date Noted  . Tremor of right hand 05/02/2020  . Uncontrolled REM sleep behavior disorder 05/02/2020  . Hemiparesis of right dominant side ([place]) 05/02/2020  . Chest pain 06/22/2014  . Essential hypertension 06/22/2014  . Belching 06/22/2014  . Local skin infection 03/16/2013  . Arteriosclerotic cardiovascular disease (ASCVD) 06/26/2012  . Hyperlipidemia     Past Surgical History:  Procedure Laterality Date  . CORONARY ARTERY BYPASS GRAFT  06/24/2012   Procedure: CORONARY ARTERY BYPASS GRAFTING (CABG);  Surgeon: [name], MD;  Location: [facility];  Service: Open Heart Surgery;  Laterality: N/A;  Coronary Artery  Bypass X 4 utilizing endoscopic saphenous vein graft and  Left radial Artery Harvest   . CYSTOSCOPY W/ RETROGRADES Left 08/23/2015   Procedure: CYSTOSCOPY WITH RETROGRADE PYELOGRAM;  Surgeon: [name], MD;  Location: [facility];  Service: Urology;  Laterality: Left;  . CYSTOSCOPY WITH URETEROSCOPY, STONE BASKETRY AND STENT PLACEMENT Left 08/23/2015   Procedure: CYSTOSCOPY WITH URETEROSCOPY, STONE BASKET EXTRACTION;  Surgeon: [name],  MD;  Location: [facility];  Service: Urology;  Laterality: Left;  . HOLMIUM LASER APPLICATION Left 56/31/4970   Procedure: HOLMIUM LASER APPLICATION;  Surgeon: [name], MD;  Location[facility];  Service: Urology;  Laterality: Left;  . LEFT HEART CATHETERIZATION WITH CORONARY ANGIOGRAM N/A 06/23/2012   Procedure: LEFT HEART CATHETERIZATION WITH CORONARY ANGIOGRAM;  Surgeon: [name], MD;  Location: [facility] C[facility] LAB;  Service: Cardiovascular;  Laterality: N/A;  . RHINOPLASTY    . TONSILLECTOMY  ~1971       Family History  Problem Relation Age of Onset  . Hypertension Mother   . Ulcers Father        Peptic ulcer disease  . Coronary artery disease Father        Stents  . Coronary artery disease Brother        CABG    Social History   Tobacco Use  . Smoking status: Never Smoker  . Smokeless tobacco: Former Systems developer    Types: Chew  . Tobacco comment: 06/22/2012 "quit chewing 10-15 years ago"  Vaping Use  . Vaping Use: Never used  Substance Use Topics  . Alcohol use: No    Alcohol/week: 0.0 standard drinks  . Drug use: No    Home Medications Prior to Admission medications   Medication Sig Start Date End Date Taking? Authorizing Provider  aspirin 81 MG chewable tablet Chew 81 mg by mouth every evening. TO START DOSAGE IN 6 MONTHS APPROXIMATELY July 2014   Yes [provider]  atorvastatin (LIPITOR) 20 MG tablet Take 1 tablet by mouth once daily 10/18/19  Yes Soyla Dryer, PA-C  metoprolol succinate (TOPROL XL) 25 MG 24 hr tablet Take 1 tablet (25 mg total) by mouth daily. 07/01/19  Yes Barrett, Evelene Croon, PA-C  nitroGLYCERIN (NITROSTAT) 0.4 MG SL tablet Place 1 tablet (0.4 mg total) under the tongue every 5 (five) minutes as needed for chest pain. 05/13/17  Yes Herminio Commons, MD  omega-3 acid ethyl esters (LOVAZA) 1 G capsule Take 1 g by mouth every evening.   Yes [provider]  PARoxetine (PAXIL) 20 MG tablet Take 1 tablet by mouth once daily 11/09/19   Yes Soyla Dryer, PA-C  clonazePAM (KLONOPIN) 0.5 MG tablet Take 1 tablet (0.5 mg total) by mouth at bedtime. 05/02/20   Dohmeier, Asencion Partridge, MD    Allergies    Pollen extract  Review of Systems   Review of Systems  Constitutional: Negative for appetite change.  Respiratory: Negative for shortness of breath.   Cardiovascular: Negative for chest pain.  Gastrointestinal: Negative for abdominal pain.  Musculoskeletal: Negative for neck pain.       Left shoulder pain.  Neurological: Negative for weakness, numbness and headaches.    Physical Exam Updated Vital Signs BP 115/66   Pulse 89   Temp 97.9 F (36.6 C) (Oral)   Resp 18   Ht 5\' 10"  (1.778 m)   Wt (!) 97.5 kg   SpO2 95%   BMI 30.85 kg/m   Physical Exam Vitals reviewed.  HENT:     Head: Normocephalic and atraumatic.  Eyes:     Pupils: Pupils are equal, round, and reactive to light.  Cardiovascular:     Rate and Rhythm: Normal rate and regular rhythm.  Chest:     Chest wall: No tenderness.  Abdominal:     Tenderness: There is no abdominal tenderness.  Musculoskeletal:        General: Tenderness present.     Cervical back: Neck supple.     Comments: Tenderness over left AC joint.  Good range of motion left shoulder.  No deformity of clavicle.  Skin:    General: Skin is warm.     Capillary Refill: Capillary refill takes less than 2 seconds.  Neurological:     Mental Status: He is alert and oriented to person, place, and time.     ED Results / Procedures / Treatments   Labs (all labs ordered are listed, but only abnormal results are displayed) Labs Reviewed - No data to display  EKG None  Radiology DG Shoulder Left  Result Date: 05/29/2020 CLINICAL DATA:  Fall EXAM: LEFT SHOULDER - 2+ VIEW COMPARISON:  None. FINDINGS: There is anatomic alignment at the glenohumeral joint. No acute fracture. Joint space is preserved. Question of mild widening of the left acromioclavicular joint with possible mild superior  offset of the clavicle. IMPRESSION: No acute fracture.  Question left AC joint injury. Electronically Signed   By: Macy Mis M.D.   On: 05/29/2020 10:30    Procedures Procedures (including critical care time)  Medications Ordered in ED Medications - No data to display  ED Course  I have reviewed the triage vital signs and the nursing notes.  Pertinent labs & imaging results that were available during my care of the patient were reviewed by me and considered in my medical decision making (see chart for details).    MDM Rules/Calculators/A&P  Patient somewhat recently diagnosed with Covid.  Doing well respiratory wise but had a fall last night when he fell off the couch.  Left shoulder pain.  Tenderness is superior.  Possible AC injury on x-ray.  Sling given initially for comfort.  Ortho follow-up as needed. Final Clinical Impression(s) / ED Diagnoses Final diagnoses:  Acromioclavicular sprain, left, initial encounter    Rx / DC Orders ED Discharge Orders    None       Davonna Belling, MD 05/29/20 1045

## 2020-05-30 ENCOUNTER — Institutional Professional Consult (permissible substitution): Payer: Self-pay | Admitting: Neurology
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
## 2020-06-06 NOTE — Progress Notes (Signed)
IMPRESSION:   1. No evidence of Sleep Apnea (SA), just mild snoring.  2. Moderately severe Periodic Limb Movement Disorder (PLMD).  Unfortunately, no REM sleep was recorded, making the correlation  to REM BD impossible.    RECOMMENDATIONS:   1. Advise to start empiric treatment for REM BD. An underlying  condition of Parkinson's Disease needs to be ruled in/out.  2. There is evidence of snoring and of periodic movements in  wakefulness, both may have contributed to a much prolonged sleep  latency.  3. Consider replacing Paxil with another SSRI - Paxil can  contribute to PLMs.

## 2020-06-08 ENCOUNTER — Telehealth: Payer: Self-pay | Admitting: Neurology

## 2020-06-08 NOTE — Telephone Encounter (Signed)
Called the patient and was able to review the SSR with him. The night of the study was not considered to be the best night's sleep as he was not able to enter into REM BD. Advised there was no concern of sleep apnea, heart irregularities or oxygen concerns. There was noted to be moderate to severe PLM's. Advised Dr Dohmeier would recommend pt reaching out to PCP to discuss potentially changing to another medication because the Paxil can cause an increase in PLM's. Pt verbalized understanding and asked to make sure referring provider received a copy.

## 2020-06-08 NOTE — Telephone Encounter (Signed)
-----   Message from Larey Seat, MD sent at 06/06/2020 12:06 PM EDT ----- IMPRESSION:   1. No evidence of Sleep Apnea (SA), just mild snoring.  2. Moderately severe Periodic Limb Movement Disorder (PLMD).  Unfortunately, no REM sleep was recorded, making the correlation  to REM BD impossible.    RECOMMENDATIONS:   1. Advise to start empiric treatment for REM BD. An underlying  condition of Parkinson's Disease needs to be ruled in/out.  2. There is evidence of snoring and of periodic movements in  wakefulness, both may have contributed to a much prolonged sleep  latency.  3. Consider replacing Paxil with another SSRI - Paxil can  contribute to PLMs.

## 2020-06-20 ENCOUNTER — Ambulatory Visit (INDEPENDENT_AMBULATORY_CARE_PROVIDER_SITE_OTHER): Payer: No Typology Code available for payment source | Admitting: Orthopaedic Surgery

## 2020-06-20 ENCOUNTER — Other Ambulatory Visit: Payer: Self-pay

## 2020-06-20 ENCOUNTER — Encounter: Payer: Self-pay | Admitting: Orthopaedic Surgery

## 2020-06-20 VITALS — BP 156/83 | HR 73 | Ht 70.0 in | Wt 208.4 lb

## 2020-06-20 DIAGNOSIS — M25512 Pain in left shoulder: Secondary | ICD-10-CM

## 2020-06-20 DIAGNOSIS — W08XXXA Fall from other furniture, initial encounter: Secondary | ICD-10-CM

## 2020-06-20 MED ORDER — NAPROXEN 500 MG PO TABS: 500.0000 mg | ORAL_TABLET | Freq: Two times a day (BID) | ORAL | 5 refills | Status: DC

## 2020-06-20 NOTE — Progress Notes (Signed)
Subjective:    Patient ID: Edward Fisher, male    DOB: September 27, 1966, 54 y.o.   MRN: [id number]  HPI He fell off his couch on 05-28-2020 and hurt his left shoulder.  He went to the ER the next day and had x-rays done.  I have reviewed the notes.  I have independently reviewed and interpreted x-rays of this patient done at another site by another physician or qualified health professional.  He has tenderness of the [facility] joint on the left more with overhead use and laying on it at night.  He has no redness, no weakness, no swelling of the shoulder.   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   Review of Systems  Constitutional: Positive for activity change.  Musculoskeletal: Positive for arthralgias.  All other systems reviewed and are negative.  For Review of Systems, all other systems reviewed and are negative.  The following is a summary of the past history medically, past history surgically, known current medicines, social history and family history.  This information is gathered electronically by the computer from prior information and documentation.  I review this each visit and have found including this information at this point in the chart is beneficial and informative.   Past Medical History:  Diagnosis Date  . Arteriosclerotic cardiovascular disease (ASCVD)    Onset in 04/2012; CABG in 06/2012 w/ LIMA-LAD, L Rad-CFX, SVG-PL-PDA  . Hyperlipidemia    goal LDL < 70  . Insomnia   . Nephrolithiasis     Past Surgical History:  Procedure Laterality Date  . CORONARY ARTERY BYPASS GRAFT  06/24/2012   Procedure: CORONARY ARTERY BYPASS GRAFTING (CABG);  Surgeon: [name], MD;  Location: [place];  Service: Open Heart Surgery;  Laterality: N/A;  Coronary Artery  Bypass X 4 utilizing endoscopic saphenous vein graft and  Left radial Artery Harvest   . CYSTOSCOPY W/ RETROGRADES Left 08/23/2015   Procedure: CYSTOSCOPY WITH RETROGRADE PYELOGRAM;  Surgeon:  [name], MD;  Location[facility];  Service: Urology;  Laterality: Left;  . CYSTOSCOPY WITH URETEROSCOPY, STONE BASKETRY AND STENT PLACEMENT Left 08/23/2015   Procedure: CYSTOSCOPY WITH URETEROSCOPY, STONE BASKET EXTRACTION;  Surgeon: [name], MD;  Location: [facility];  Service: Urology;  Laterality: Left;  . HOLMIUM LASER APPLICATION Left 28/41/3244   Procedure: HOLMIUM LASER APPLICATION;  Surgeon: [name], MD;  Location: [facility];  Service: Urology;  Laterality: Left;  . LEFT HEART CATHETERIZATION WITH CORONARY ANGIOGRAM N/A 06/23/2012   Procedure: LEFT HEART CATHETERIZATION WITH CORONARY ANGIOGRAM;  Surgeon: [name], MD;  Location: [facility] CATH LAB;  Service: Cardiovascular;  Laterality: N/A;  . RHINOPLASTY    . TONSILLECTOMY  ~1971    Current Outpatient Medications on File Prior to Visit  Medication Sig Dispense Refill  . aspirin 81 MG chewable tablet Chew 81 mg by mouth every evening. TO START DOSAGE IN 6 MONTHS APPROXIMATELY July 2014    . atorvastatin (LIPITOR) 20 MG tablet Take 1 tablet by mouth once daily 30 tablet 6  . clonazePAM (KLONOPIN) 0.5 MG tablet Take 1 tablet (0.5 mg total) by mouth at bedtime. 30 tablet 0  . metoprolol succinate (TOPROL XL) 25 MG 24 hr tablet Take 1 tablet (25 mg total) by mouth daily. 30 tablet 11  . nitroGLYCERIN (NITROSTAT) 0.4 MG SL tablet Place 1 tablet (0.4 mg total) under the tongue every 5 (five) minutes as needed for chest pain. 25 tablet 3  .  omega-3 acid ethyl esters (LOVAZA) 1 G capsule Take 1 g by mouth every evening.    [name] PARoxetine (PAXIL) 20 MG tablet Take 1 tablet by mouth once daily 30 tablet 6   No current facility-administered medications on file prior to visit.    Social History   Socioeconomic History  . Marital status: Married    Spouse name: Not on file  . Number of children: Not on file  . Years of education: Not on file  . Highest education level: Not on file  Occupational History  . Occupation:  Agricultural consultant: Medical illustrator AND BATH    Comment: self-employed  Tobacco Use  . Smoking status: Never Smoker  . Smokeless tobacco: Former Systems developer    Types: Chew  . Tobacco comment: 06/22/2012 "quit chewing 10-15 years ago"  Vaping Use  . Vaping Use: Never used  Substance and Sexual Activity  . Alcohol use: No    Alcohol/week: 0.0 standard drinks  . Drug use: No  . Sexual activity: Yes  Other Topics Concern  . Not on file  Social History Narrative   Regular exercise: No   Social Determinants of Health   Financial Resource Strain:   . Difficulty of Paying Living Expenses:   Food Insecurity:   . Worried About Charity fundraiser in the Last Year:   . Arboriculturist in the Last Year:   Transportation Needs:   . Film/video editor (Medical):   [name] Lack of Transportation (Non-Medical):   Physical Activity:   . Days of Exercise per Week:   . Minutes of Exercise per Session:   Stress:   . Feeling of Stress :   Social Connections:   . Frequency of Communication with Friends and Family:   . Frequency of Social Gatherings with Friends and Family:   . Attends Religious Services:   . Active Member of Clubs or Organizations:   . Attends Archivist Meetings:   [name] Marital Status:   Intimate Partner Violence:   . Fear of Current or Ex-Partner:   . Emotionally Abused:   [name] Physically Abused:   . Sexually Abused:     Family History  Problem Relation Age of Onset  . Hypertension Mother   . Ulcers Father        Peptic ulcer disease  . Coronary artery disease Father        Stents  . Coronary artery disease Brother        CABG    BP (!) 156/83   Pulse 73   Ht 5\' 10"  (1.778 m)   Wt 208 lb 6 oz (94.5 kg)   BMI 29.90 kg/m   Body mass index is 29.9 kg/m.     Objective:   Physical Exam Vitals and nursing note reviewed.  Constitutional:      Appearance: He is well-developed.  HENT:     Head: Normocephalic and atraumatic.  Eyes:      Conjunctiva/sclera: Conjunctivae normal.     Pupils: Pupils are equal, round, and reactive to light.  Cardiovascular:     Rate and Rhythm: Normal rate and regular rhythm.  Pulmonary:     Effort: Pulmonary effort is normal.  Abdominal:     Palpations: Abdomen is soft.  Musculoskeletal:       Arms:     Cervical back: Normal range of motion and neck supple.  Skin:    General: Skin is warm and dry.  Neurological:  Mental Status: He is alert and oriented to person, place, and time.     Cranial Nerves: No cranial nerve deficit.     Motor: No abnormal muscle tone.     Coordination: Coordination normal.     Deep Tendon Reflexes: Reflexes are normal and symmetric. Reflexes normal.  Psychiatric:        Behavior: Behavior normal.        Thought Content: Thought content normal.        Judgment: Judgment normal.           Assessment & Plan:   Encounter Diagnosis  Name Primary?  . Acute pain of left shoulder Yes   He has hurt his AC joint on the left.    I will begin PT in [place].  I will order Naprosyn 500 po bid pc.  Return in two weeks.  He might need MRI.  Call if any problem.  Precautions discussed.   Electronically Signed Sanjuana Kava, MD 8/17/20219:00 AM

## 2020-06-25 NOTE — Progress Notes (Signed)
Cardiology Office Note  Date: 06/26/2020   ID: Edward, Fisher 04-04-1966, MRN [id number]  PCP:  Soyla Dryer, PA-C  Cardiologist:  No primary care provider on file. Electrophysiologist:  None   Chief Complaint: Follow up CAD  History of Present Illness: Edward Fisher is a 54 y.o. male with a history of CAD (CABG 2013 LIMA-LAD, left radial-circumflex, SVG-PL-PDA), HTN, Fatigue. Occasional SOB.   Last visit with Edward Ferries, PA on 06/17/2019.  He stated he did not exercise because he was usually is so tired from working.  He complains of fatigue that is worse than usual.  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  Stated the fatigue was unusual for him.  He was having shortness of breath at times with exertion.  He wondered if that could be from weight gain.  He however he was concerned that the DOE was his main symptoms with his coronary artery disease and subsequent CABG.  He admitted mild lower extremity during the day.  No orthopnea or PND.  [name] ordered a Lexiscan Myoview.  She ordered Toprol XL 25 mg to be added if blood pressure was consistently elevated at [name] stress test.  His LDL was 72 she encouraged him to eat better to try to reduce that so would not have to take more medication.  He again 20 pounds in the previous 6 months and not watching what he was eating.   Patient is here for 1 year follow-up.  States he recently had Covid in July as well as his wife.  He states his wife passed away as a result of a combination of Covid virus and liver disease.  He denies any complications or sequelae from the Covid virus.  He denies any anginal or exertional symptoms, palpitations or arrhythmias, orthostatic symptoms, PND, orthopnea, CVA or TIA-like symptoms, bleeding.  Denies any claudication-like issues, lower extremity edema or DVT/PE symptoms.  His blood pressure is well controlled.  Blood pressure is 122/72 heart  rate is 65.  He states he had gained around 20 pounds at his last visit and has lost some weight but not back to previous weight prior to weight gain.  States he has an occasional twinge of minor chest pain when exerting.  His profession is cabinetmaking and installing.  He states he notices it mostly when he is lifting the cabinets to install them or other related exertional tasks.  He states it may be due to the median sternotomy he had in 2013 but he is not sure.  He denies any associated radiation or nausea, vomiting, or diaphoresis.  Denies any dizziness or shortness of breath.   Past Medical History:  Diagnosis Date  . Arteriosclerotic cardiovascular disease (ASCVD)    Onset in 04/2012; CABG in 06/2012 w/ LIMA-LAD, L Rad-CFX, SVG-PL-PDA  . Hyperlipidemia    goal LDL < 70  . Insomnia   . Nephrolithiasis     Past Surgical History:  Procedure Laterality Date  . CORONARY ARTERY BYPASS GRAFT  06/24/2012   Procedure: CORONARY ARTERY BYPASS GRAFTING (CABG);  Surgeon: [name], MD;  Location: [place];  Service: Open Heart Surgery;  Laterality: N/A;  Coronary Artery  Bypass X 4 utilizing endoscopic saphenous vein graft and  Left radial Artery Harvest   . CYSTOSCOPY W/ RETROGRADES Left 08/23/2015   Procedure: CYSTOSCOPY WITH RETROGRADE PYELOGRAM;  Surgeon: [name], MD;  Location: [facility];  Service:  Urology;  Laterality: Left;  . CYSTOSCOPY WITH URETEROSCOPY, STONE BASKETRY AND STENT PLACEMENT Left 08/23/2015   Procedure: CYSTOSCOPY WITH URETEROSCOPY, STONE BASKET EXTRACTION;  Surgeon: [name], MD;  Location: [facility];  Service: Urology;  Laterality: Left;  . HOLMIUM LASER APPLICATION Left 65/46/5035   Procedure: HOLMIUM LASER APPLICATION;  Surgeon: [name], MD;  Location[facility];  Service: Urology;  Laterality: Left;  . LEFT HEART CATHETERIZATION WITH CORONARY ANGIOGRAM N/A 06/23/2012   Procedure: LEFT HEART CATHETERIZATION WITH CORONARY ANGIOGRAM;  Surgeon:  [name], MD;  Location: [facility] CATH LAB;  Service: Cardiovascular;  Laterality: N/A;  . RHINOPLASTY    . TONSILLECTOMY  ~1971    Current Outpatient Medications  Medication Sig Dispense Refill  . aspirin 81 MG chewable tablet Chew 81 mg by mouth every evening. TO START DOSAGE IN 6 MONTHS APPROXIMATELY July 2014    . atorvastatin (LIPITOR) 20 MG tablet Take 1 tablet by mouth once daily 30 tablet 6  . clonazePAM (KLONOPIN) 0.5 MG tablet Take 1 tablet (0.5 mg total) by mouth at bedtime. 30 tablet 0  . metoprolol succinate (TOPROL XL) 25 MG 24 hr tablet Take 1 tablet (25 mg total) by mouth daily. 30 tablet 11  . naproxen (NAPROSYN) 500 MG tablet Take 1 tablet (500 mg total) by mouth 2 (two) times daily with a meal. 60 tablet 5  . nitroGLYCERIN (NITROSTAT) 0.4 MG SL tablet Place 1 tablet (0.4 mg total) under the tongue every 5 (five) minutes as needed for chest pain. 25 tablet 3  . omega-3 acid ethyl esters (LOVAZA) 1 G capsule Take 1 g by mouth every evening.    [name] PARoxetine (PAXIL) 20 MG tablet Take 1 tablet by mouth once daily 30 tablet 6   No current facility-administered medications for this visit.   Allergies:  Pollen extract   Social History: The patient  reports that he has never smoked. He quit smokeless tobacco use about 24 years ago.  His smokeless tobacco use included chew. He reports that he does not drink alcohol and does not use drugs.   Family History: The patient's family history includes Coronary artery disease in his brother and father; Hypertension in his mother; Ulcers in his father.   ROS:  Please see the history of present illness. Otherwise, complete review of systems is positive for none.  All other systems are reviewed and negative.   Physical Exam: VS:  BP 122/72   Pulse 65   Ht 5\' 10"  (1.778 m)   Wt 225 lb (102.1 kg)   SpO2 98%   BMI 32.28 kg/m , BMI Body mass index is 32.28 kg/m.  Wt Readings from Last 3 Encounters:  06/26/20 225 lb (102.1 kg)  06/20/20  208 lb 6 oz (94.5 kg)  05/29/20 (!) 215 lb (97.5 kg)    General: Patient appears comfortable at rest. Neck: Supple, no elevated JVP or carotid bruits, no thyromegaly. Lungs: Clear to auscultation, nonlabored breathing at rest. Cardiac: Regular rate and rhythm, no S3 or significant systolic murmur, no pericardial rub. Can only see it in here again extremities: No pitting edema, distal pulses 2+. Skin: Warm and dry. Musculoskeletal: No kyphosis. Neuropsychiatric: Alert and oriented x3, affect grossly appropriate.  ECG:  EKG 05/25/2020 showed sinus rhythm with a rate of 93, ventricular premature complex, probable left atrial enlargement, inferior infarct old.  Recent Labwork: 05/25/2020: ALT 100; AST 64; BUN 17; Creatinine, Ser 1.11; Hemoglobin 16.2; Platelets 123; Potassium 4.0; Sodium 134  Component Value Date/Time   CHOL 128 03/24/2020 0839   TRIG 161 (H) 03/24/2020 0839   HDL 27 (L) 03/24/2020 0839   CHOLHDL 4.7 03/24/2020 0839   VLDL 32 03/24/2020 0839   LDLCALC 69 03/24/2020 0839    Other Studies Reviewed Today:  Lexiscan Myoview stress test 06/30/2019 Study Result  Narrative & Impression   TWI's in I, II, aVL, V4-V6 seen throughout study.  There was no ST segment deviation noted during stress.  The study is normal. No ischemia or scar.  This is a low risk study.  Nuclear stress EF: 65%.     CATH: 06/23/2012 Left mainstem:The left mainstem is patent. There is mild nonobstructive plaque in the distal left main and I would estimate at 20-30%.  Left anterior descending (LAD):The proximal LAD has diffuse irregularity, but tapers with 50% stenosis leading into the first diagonal branch. The first diagonal branch covers a large territory and has no significant obstructive disease. The LAD just beyond the first diagonal has critical stenosis with 95-99% disease followed by severe diffuse disease throughout the midportion of the vessel. The distal vessel fills  competitively from antegrade flow and collateral flow supplied by the terminal portion of the RCA.  Left circumflex (LCx):The AV groove circumflex is patent throughout the proximal aspect. It supplies a large atrial branch. The vessel gives off one large obtuse marginal branch that has a 95% stenosis in its proximal aspect. The mid and distal portions of the OM are widely patent.  Right coronary artery (RCA):The RCA is a large, dominant vessel. There is moderate calcification throughout the course of the right coronary artery. There is mild diffuse nonobstructive plaque throughout the vessel. There is a focal area of moderately severe stenosis in the midportion of the vessel but I would estimate at 80%. Distally, the vessel divides into a PDA branch and 2 posterolateral branches. The PDA branch is large throughout its proximal segment. The midportion of the PDA has a tight 95% stenosis and then the terminal portion of the PDA is free of significant disease. The first posterolateral branch is large in caliber and has 50% stenosis in its proximal aspect. There is no high-grade stenosis throughout that PL branch. The second posterolateral was small.  Left ventriculography: Left ventricular systolic function is normal, LVEF is estimated at 55-65%, there is no significant mitral regurgitation   Final Conclusions:  1. Severe three-vessel coronary artery disease as outlined 2. Normal LV function  Recommendations:The patient has severe multivessel coronary artery disease at a young age. He has classic symptoms of unstable angina and requires revascularization while he is here in the hospital to prevent progressive ischemia and infarction. I think he would be best treated with coronary bypass surgery, primarily because of the diffuse nature of his LAD stenosis. Considering his young age, hybrid revascularization would be a reasonable option, especially if his PDA was not graftable beyond the area of  tight stenosis in its midportion. I will discuss with the surgical team and I appreciate their consultation. The findings were discussed with the patient and his wife in depth.   MYOVIEW: 06/22/2014 IMPRESSION: 1. Negative stress MPI for ischemia  2. Negative stress EKG for ischemia  3. Normal LV systolic function, LVEF 00%  4. Low risk Duke treadmill score of 10  5. Very good exercise capacity (120% of predicted based on age and gender)   Assessment and Plan:  1. CAD in native artery   2. Mixed hyperlipidemia    1. CAD  in native artery Status post bypass surgery in 2013.  Denies any recent anginal or exertional symptoms.  Continue aspirin 81 mg daily.  Toprol-XL 25 mg daily.  Sublingual nitroglycerin 0.4 mg as needed chest pain.  2. Mixed hyperlipidemia Recent lipid panel on 03/24/2020 showed: TC 128, TG 161, HDL 27, LDL 69.  Continue atorvastatin 20 mg daily.  Medication Adjustments/Labs and Tests Ordered: Current medicines are reviewed at length with the patient today.  Concerns regarding medicines are outlined above.   Disposition: Follow-up with Dr. Domenic Polite or APP 1 year  Signed, Levell July, NP 06/26/2020 1:56 PM    [facility] at [place], [place], [place] [address] Phone: [phone]; Fax: [phone]

## 2020-06-26 ENCOUNTER — Other Ambulatory Visit: Payer: Self-pay

## 2020-06-26 ENCOUNTER — Ambulatory Visit (INDEPENDENT_AMBULATORY_CARE_PROVIDER_SITE_OTHER): Payer: No Typology Code available for payment source | Admitting: Family Medicine

## 2020-06-26 ENCOUNTER — Encounter: Payer: Self-pay | Admitting: Family Medicine

## 2020-06-26 VITALS — BP 122/72 | HR 65 | Ht 70.0 in | Wt 225.0 lb

## 2020-06-26 DIAGNOSIS — E782 Mixed hyperlipidemia: Secondary | ICD-10-CM

## 2020-06-26 DIAGNOSIS — I2510 Atherosclerotic heart disease of native coronary artery without angina pectoris: Secondary | ICD-10-CM

## 2020-06-26 MED ORDER — NITROGLYCERIN 0.4 MG SL SUBL: 0.4000 mg | SUBLINGUAL_TABLET | SUBLINGUAL | 3 refills | Status: DC | PRN

## 2020-06-26 NOTE — Patient Instructions (Addendum)

## 2020-06-27 ENCOUNTER — Other Ambulatory Visit: Payer: Self-pay

## 2020-06-27 ENCOUNTER — Ambulatory Visit: Payer: No Typology Code available for payment source | Attending: Orthopaedic Surgery | Admitting: Physical Therapy

## 2020-06-27 DIAGNOSIS — M25511 Pain in right shoulder: Secondary | ICD-10-CM

## 2020-06-27 NOTE — Therapy (Signed)
[facility] [place], [place], [address] Phone: [phone]   Fax:  [phone]  Physical Therapy Evaluation  Patient Details  Name: Edward Fisher MRN: [id number] Date of Birth: 11-13-1965 Referring Provider (PT): Sanjuana Kava MD   Encounter Date: 06/27/2020   PT End of Session - 06/27/20 1347    Visit Number 1    Number of Visits 12    Date for PT Re-Evaluation 08/08/20    PT Start Time 0127    PT Stop Time 0205    PT Time Calculation (min) 38 min    Activity Tolerance Patient tolerated treatment well    Behavior During Therapy [facility] for tasks assessed/performed           Past Medical History:  Diagnosis Date  . Arteriosclerotic cardiovascular disease (ASCVD)    Onset in 04/2012; CABG in 06/2012 w/ LIMA-LAD, L Rad-CFX, SVG-PL-PDA  . Hyperlipidemia    goal LDL < 70  . Insomnia   . Nephrolithiasis     Past Surgical History:  Procedure Laterality Date  . CORONARY ARTERY BYPASS GRAFT  06/24/2012   Procedure: CORONARY ARTERY BYPASS GRAFTING (CABG);  Surgeon: [name], MD;  Location: [place];  Service: Open Heart Surgery;  Laterality: N/A;  Coronary Artery  Bypass X 4 utilizing endoscopic saphenous vein graft and  Left radial Artery Harvest   . CYSTOSCOPY W/ RETROGRADES Left 08/23/2015   Procedure: CYSTOSCOPY WITH RETROGRADE PYELOGRAM;  Surgeon: [name], MD;  [facility];  Service: Urology;  Laterality: Left;  . CYSTOSCOPY WITH URETEROSCOPY, STONE BASKETRY AND STENT PLACEMENT Left 08/23/2015   Procedure: CYSTOSCOPY WITH URETEROSCOPY, STONE BASKET EXTRACTION;  Surgeon: [name], MD;  Location: [facility];  Service: Urology;  Laterality: Left;  . HOLMIUM LASER APPLICATION Left 54/62/7035   Procedure: HOLMIUM LASER APPLICATION;  Surgeon: [name], MD;  Location[facility];  Service: Urology;  Laterality: Left;  . LEFT HEART CATHETERIZATION WITH CORONARY ANGIOGRAM N/A 06/23/2012   Procedure:  LEFT HEART CATHETERIZATION WITH CORONARY ANGIOGRAM;  Surgeon: [name], MD;  Location: [facility] CATH LAB;  Service: Cardiovascular;  Laterality: N/A;  . RHINOPLASTY    . TONSILLECTOMY  ~1971    There were no vitals filed for this visit.    Subjective Assessment - 06/27/20 1353    Subjective COVID-19 screen performed prior to patient entering clinic.  The patient reporting falling onto his left shoulder from his couch about four weeks ago.  He feels the shoulder has improved some since the fall.  Rest decrease pain and certain movements increase his pain.  He is not able to sleep on his left side.    Pertinent History ASCVD, CABG.    Patient Stated Goals Use left UE without pain.    Currently in Pain? Yes    Pain Score 6     Pain Location Shoulder    Pain Orientation Left    Pain Descriptors / Indicators Sharp    Pain Type Acute pain    Pain Onset More than a month ago    Pain Frequency Constant    Aggravating Factors  See above.    Pain Relieving Factors See above.              [facility] PT Assessment - 06/27/20 0001      Assessment   Medical Diagnosis Acute pain of left shoulder.    Referring Provider (PT) Sanjuana Kava MD    Onset Date/Surgical Date --   ~  4 weeks.     Precautions   Precautions None      Restrictions   Weight Bearing Restrictions No      Balance Screen   Has the patient fallen in the past 6 months Yes    How many times? --   1.   Has the patient had a decrease in activity level because of a fear of falling?  No    Is the patient reluctant to leave their home because of a fear of falling?  No      Prior Function   Level of Independence Independent      Posture/Postural Control   Posture Comments Left ACJ appears slightly higher than right.      ROM / Strength   AROM / PROM / Strength AROM;Strength      AROM   Overall AROM Comments Full active left shoulder range of motion.      Strength   Overall Strength Comments Normal right shoulder  strength.      Palpation   Palpation comment Tender to palpation around patient's left ACJ (especially posterior).                      Objective measurements completed on examination: See above findings.       [facility] Adult PT Treatment/Exercise - 06/27/20 0001      Modalities   Modalities Electrical Stimulation;Vasopneumatic      Electrical Stimulation   Electrical Stimulation Location Left ACJ.    Electrical Stimulation Action Pre-mod.    Electrical Stimulation Parameters 80-150 Hz x 15 minutes.    Electrical Stimulation Goals Pain      Vasopneumatic   Number Minutes Vasopneumatic  15 minutes    Vasopnuematic Location  --   Right shoulder with pillow between elbow and thorax.     Vasopneumatic Pressure Low                               Plan - 06/27/20 1415    Clinical Impression Statement The patient presents to OPPT with c/o left shoulder pain due to a fall.  His pain is localized to his left ACJ which appeears to be a bit raised when contralaterally compared.  His left shoulder AROM and strength is normal.  Patient will benefit from skilled physical therapy intervention to address pain.    Personal Factors and Comorbidities Comorbidity 1    Examination-Activity Limitations Other    Examination-Participation Restrictions Other    Stability/Clinical Decision Making Stable/Uncomplicated    Clinical Decision Making Low    Rehab Potential Excellent    PT Frequency 2x / week    PT Duration 6 weeks    PT Treatment/Interventions ADLs/Self Care Home Management;Cryotherapy;Electrical Stimulation;Ultrasound;Moist Heat;Iontophoresis 4mg /ml Dexamethasone;Therapeutic exercise;Therapeutic activities;Manual techniques;Patient/family education;Vasopneumatic Device    PT Next Visit Plan Pulse combo e'stim/US; STW/M, vaso and e'stim.  RW4, UBE (non-painful).    Consulted and Agree with Plan of Care Patient           Patient will benefit from skilled  therapeutic intervention in order to improve the following deficits and impairments:  Pain, Decreased activity tolerance  Visit Diagnosis: Acute pain of right shoulder - Plan: PT plan of care cert/re-cert     Problem List Patient Active Problem List   Diagnosis Date Noted  . Tremor of right hand 05/02/2020  . Uncontrolled REM sleep behavior disorder 05/02/2020  . Hemiparesis of right dominant  side ([place]) 05/02/2020  . Chest pain 06/22/2014  . Essential hypertension 06/22/2014  . Belching 06/22/2014  . Local skin infection 03/16/2013  . Arteriosclerotic cardiovascular disease (ASCVD) 06/26/2012  . Hyperlipidemia     Sueann Brownley, [name] 06/27/2020, 2:25 PM  [facility] [address] [place], [place], [address] Phone: [phone]   Fax:  [phone]  Name: Edward Fisher MRN: [id number] Date of Birth: September 28, 1966

## 2020-06-29 ENCOUNTER — Institutional Professional Consult (permissible substitution): Payer: Self-pay | Admitting: Neurology

## 2020-06-29 ENCOUNTER — Other Ambulatory Visit: Payer: Self-pay

## 2020-06-29 ENCOUNTER — Encounter: Payer: Self-pay | Admitting: Physical Therapy

## 2020-06-29 ENCOUNTER — Ambulatory Visit: Payer: No Typology Code available for payment source | Admitting: Physical Therapy

## 2020-06-29 DIAGNOSIS — M25511 Pain in right shoulder: Secondary | ICD-10-CM

## 2020-06-29 NOTE — Therapy (Signed)
[facility] [place], [place], [address] Phone: [phone]   Fax:  [phone]  Physical Therapy Treatment  Patient Details  Name: Edward Fisher MRN: [id number] Date of Birth: 1966-03-29 Referring Provider (PT): Sanjuana Kava MD   Encounter Date: 06/29/2020   PT End of Session - 06/29/20 0823    Visit Number 2    Number of Visits 12    Date for PT Re-Evaluation 08/08/20    PT Start Time 0815    PT Stop Time 0853    PT Time Calculation (min) 38 min    Activity Tolerance Patient tolerated treatment well    Behavior During Therapy [facility] for tasks assessed/performed           Past Medical History:  Diagnosis Date  . Arteriosclerotic cardiovascular disease (ASCVD)    Onset in 04/2012; CABG in 06/2012 w/ LIMA-LAD, L Rad-CFX, SVG-PL-PDA  . Hyperlipidemia    goal LDL < 70  . Insomnia   . Nephrolithiasis     Past Surgical History:  Procedure Laterality Date  . CORONARY ARTERY BYPASS GRAFT  06/24/2012   Procedure: CORONARY ARTERY BYPASS GRAFTING (CABG);  Surgeon: [name], MD;  Location: [place];  Service: Open Heart Surgery;  Laterality: N/A;  Coronary Artery  Bypass X 4 utilizing endoscopic saphenous vein graft and  Left radial Artery Harvest   . CYSTOSCOPY W/ RETROGRADES Left 08/23/2015   Procedure: CYSTOSCOPY WITH RETROGRADE PYELOGRAM;  Surgeon: [name], MD;  [facility];  Service: Urology;  Laterality: Left;  . CYSTOSCOPY WITH URETEROSCOPY, STONE BASKETRY AND STENT PLACEMENT Left 08/23/2015   Procedure: CYSTOSCOPY WITH URETEROSCOPY, STONE BASKET EXTRACTION;  Surgeon: [name], MD;  [facility];  Service: Urology;  Laterality: Left;  . HOLMIUM LASER APPLICATION Left 43/15/4008   Procedure: HOLMIUM LASER APPLICATION;  Surgeon: [name], MD;  [facility];  Service: Urology;  Laterality: Left;  . LEFT HEART CATHETERIZATION WITH CORONARY ANGIOGRAM N/A 06/23/2012   Procedure:  LEFT HEART CATHETERIZATION WITH CORONARY ANGIOGRAM;  Surgeon: [name], MD;  Location: [facility] CATH LAB;  Service: Cardiovascular;  Laterality: N/A;  . RHINOPLASTY    . TONSILLECTOMY  ~1971    There were no vitals filed for this visit.   Subjective Assessment - 06/29/20 0819    Subjective COVID-19 screen performed prior to patient entering clinic.  Pt arriving reporting 2/10 pain in left shoulder. Pt also reporting he may need to leave a little early to get to work.    Pertinent History ASCVD, CABG.    Patient Stated Goals Use left UE without pain.    Currently in Pain? Yes    Pain Score 2     Pain Location Shoulder    Pain Orientation Left    Pain Descriptors / Indicators Aching    Pain Type Acute pain    Pain Onset More than a month ago                             [facility] Adult PT Treatment/Exercise - 06/29/20 0001      Posture/Postural Control   Posture Comments Left ACJ appears slightly higher than right.      Exercises   Exercises Shoulder      Shoulder Exercises: Supine   Diagonals AROM;Strengthening;Left;15 reps;Theraband;Limitations    Theraband Level (Shoulder Diagonals) Level 1 (Yellow)    Diagonals Limitations D1 and D2  Other Supine Exercises cervical retraction x 10 holding 5 seconds each, upper trap stretch x 5 holding 10 seconds each bilateral shoulders      Shoulder Exercises: Seated   Row AROM;Strengthening;Both;15 reps;Theraband    Theraband Level (Shoulder Row) Level 1 (Yellow)      Shoulder Exercises: Pulleys   Flexion 3 minutes    ABduction 2 minutes      Shoulder Exercises: ROM/Strengthening   UBE (Upper Arm Bike) 90 rpm: 5 minutes (2.5 minutes each direction)      Modalities   Modalities Cryotherapy;Electrical Stimulation      Cryotherapy   Number Minutes Cryotherapy 10 Minutes    Cryotherapy Location Shoulder    Type of Cryotherapy Ice pack   with E-stim     Electrical Stimulation   Electrical Stimulation Location Left  ACJ    Electrical Stimulation Action pre-mod    Electrical Stimulation Parameters 80-150 Hz x 10 minutes    Electrical Stimulation Goals Pain      Manual Therapy   Manual Therapy Soft tissue mobilization    Manual therapy comments 5 minutes    Soft tissue mobilization Left upper trap trigger point release in multiple areas                  PT Education - 06/29/20 0846    Education Details exercise technique    Person(s) Educated Patient    Methods Explanation;Demonstration    Comprehension Verbalized understanding;Returned demonstration                      Plan - 06/29/20 5732    Clinical Impression Statement Pt arriving to therapy reporting 2/10 pain in his left shoulder. Pt still with tenderness over ACJ. Pt tolerating exercises with no reports of increased pain. Continue with skilled PT.    Personal Factors and Comorbidities Comorbidity 1    Comorbidities Bypass 2013    Examination-Activity Limitations Other    Examination-Participation Restrictions Other    Stability/Clinical Decision Making Stable/Uncomplicated    Rehab Potential Excellent    PT Frequency 2x / week    PT Duration 6 weeks    PT Treatment/Interventions ADLs/Self Care Home Management;Cryotherapy;Electrical Stimulation;Ultrasound;Moist Heat;Iontophoresis 4mg /ml Dexamethasone;Therapeutic exercise;Therapeutic activities;Manual techniques;Patient/family education;Vasopneumatic Device    PT Next Visit Plan Pulse combo e'stim/US; STW/M, vaso and e'stim.  RW4, UBE (non-painful).    Consulted and Agree with Plan of Care Patient           Patient will benefit from skilled therapeutic intervention in order to improve the following deficits and impairments:  Pain, Decreased activity tolerance  Visit Diagnosis: Acute pain of right shoulder     Problem List Patient Active Problem List   Diagnosis Date Noted  . Tremor of right hand 05/02/2020  . Uncontrolled REM sleep behavior disorder  05/02/2020  . Hemiparesis of right dominant side ([place]) 05/02/2020  . Chest pain 06/22/2014  . Essential hypertension 06/22/2014  . Belching 06/22/2014  . Local skin infection 03/16/2013  . Arteriosclerotic cardiovascular disease (ASCVD) 06/26/2012  . Hyperlipidemia     Edward Fisher, PT, MPT 06/29/2020, 9:00 AM  [facility] [address] [place], [place], [address] Phone: [phone]   Fax:  [phone]  Name: YASUO PHIMMASONE MRN: [id number] Date of Birth: 10/03/1966

## 2020-07-04 ENCOUNTER — Ambulatory Visit: Payer: No Typology Code available for payment source | Admitting: Physical Therapy

## 2020-07-04 ENCOUNTER — Other Ambulatory Visit: Payer: Self-pay

## 2020-07-04 DIAGNOSIS — M25511 Pain in right shoulder: Secondary | ICD-10-CM

## 2020-07-04 NOTE — Therapy (Signed)
[facility] [place], [place], [address] Phone: [phone]   Fax:  [phone]  Physical Therapy Treatment  Patient Details  Name: Edward Fisher MRN: [id number] Date of Birth: 01-15-1966 Referring Provider (PT): Sanjuana Kava MD   Encounter Date: 07/04/2020   PT End of Session - 07/04/20 1556    Visit Number 3    Number of Visits 12    Date for PT Re-Evaluation 08/08/20    PT Start Time 0315    PT Stop Time 0408    PT Time Calculation (min) 53 min    Activity Tolerance Patient tolerated treatment well    Behavior During Therapy [facility] for tasks assessed/performed           Past Medical History:  Diagnosis Date  . Arteriosclerotic cardiovascular disease (ASCVD)    Onset in 04/2012; CABG in 06/2012 w/ LIMA-LAD, L Rad-CFX, SVG-PL-PDA  . Hyperlipidemia    goal LDL < 70  . Insomnia   . Nephrolithiasis     Past Surgical History:  Procedure Laterality Date  . CORONARY ARTERY BYPASS GRAFT  06/24/2012   Procedure: CORONARY ARTERY BYPASS GRAFTING (CABG);  Surgeon: [name], MD;  Location: [place];  Service: Open Heart Surgery;  Laterality: N/A;  Coronary Artery  Bypass X 4 utilizing endoscopic saphenous vein graft and  Left radial Artery Harvest   . CYSTOSCOPY W/ RETROGRADES Left 08/23/2015   Procedure: CYSTOSCOPY WITH RETROGRADE PYELOGRAM;  Surgeon: [name], MD;  [facility];  Service: Urology;  Laterality: Left;  . CYSTOSCOPY WITH URETEROSCOPY, STONE BASKETRY AND STENT PLACEMENT Left 08/23/2015   Procedure: CYSTOSCOPY WITH URETEROSCOPY, STONE BASKET EXTRACTION;  Surgeon: [name], MD;  [facility];  Service: Urology;  Laterality: Left;  . HOLMIUM LASER APPLICATION Left 46/27/0350   Procedure: HOLMIUM LASER APPLICATION;  Surgeon: [name], MD;  [facility];  Service: Urology;  Laterality: Left;  . LEFT HEART CATHETERIZATION WITH CORONARY ANGIOGRAM N/A 06/23/2012   Procedure:  LEFT HEART CATHETERIZATION WITH CORONARY ANGIOGRAM;  Surgeon: [name], MD;  Location: [facility] CATH LAB;  Service: Cardiovascular;  Laterality: N/A;  . RHINOPLASTY    . TONSILLECTOMY  ~1971    There were no vitals filed for this visit.   Subjective Assessment - 07/04/20 1556    Subjective COVID-19 screen performed prior to patient entering clinic.  Shoulder sore today.    Pertinent History ASCVD, CABG.    Patient Stated Goals Use left UE without pain.    Currently in Pain? Yes    Pain Score 3     Pain Location Shoulder    Pain Orientation Left    Pain Descriptors / Indicators Aching    Pain Onset More than a month ago                             [facility] Adult PT Treatment/Exercise - 07/04/20 0001      Shoulder Exercises: ROM/Strengthening   UBE (Upper Arm Bike) 120 RPM's x 8 minutes.      Modalities   Modalities Merchandiser, retail Stimulation Location Left ACJ.    Electrical Stimulation Action Pre-mod.    Electrical Stimulation Parameters 80-150 Hz x 15 minutes.    Electrical Stimulation Goals Pain      Ultrasound   Ultrasound Location Left ACJ.    Ultrasound Parameters Combo e'stim/US at 1.50  W/CM2 at 20% at 3.3 mHZ x 7 minutes.      Vasopneumatic   Number Minutes Vasopneumatic  15 minutes    Vasopnuematic Location  --   Left shoulder.   Vasopneumatic Pressure Low      Manual Therapy   Manual Therapy Soft tissue mobilization    Soft tissue mobilization STW/M x 11 minutes to patient left ACJ ligamants and left UT                       PT Long Term Goals - 06/29/20 1027      PT LONG TERM GOAL #1   Title Ind with HEP.    Time 6    Period Weeks    Status New      PT LONG TERM GOAL #2   Title Perform ADL's with pain not > 2/10.    Time 6    Period Weeks    Status New                 Plan - 07/04/20 1602    Clinical Impression Statement The patient  did well with treatment though he has continued palpable soreness around his left    Personal Factors and Comorbidities Comorbidity 1    Comorbidities Bypass 2013    Examination-Activity Limitations Other    Stability/Clinical Decision Making Stable/Uncomplicated           Patient will benefit from skilled therapeutic intervention in order to improve the following deficits and impairments:  Pain, Decreased activity tolerance  Visit Diagnosis: Acute pain of right shoulder     Problem List Patient Active Problem List   Diagnosis Date Noted  . Tremor of right hand 05/02/2020  . Uncontrolled REM sleep behavior disorder 05/02/2020  . Hemiparesis of right dominant side ([place]) 05/02/2020  . Chest pain 06/22/2014  . Essential hypertension 06/22/2014  . Belching 06/22/2014  . Local skin infection 03/16/2013  . Arteriosclerotic cardiovascular disease (ASCVD) 06/26/2012  . Hyperlipidemia     Vladimir Lenhoff, [name] 07/04/2020, 4:10 PM  [facility] [address] [place], [place], [address] Phone: [phone]   Fax:  [phone]  Name: Edward Fisher MRN: [id number] Date of Birth: Jan 01, 1966

## 2020-07-06 ENCOUNTER — Ambulatory Visit (INDEPENDENT_AMBULATORY_CARE_PROVIDER_SITE_OTHER): Payer: No Typology Code available for payment source | Admitting: Orthopaedic Surgery

## 2020-07-06 ENCOUNTER — Other Ambulatory Visit: Payer: Self-pay

## 2020-07-06 ENCOUNTER — Encounter: Payer: Self-pay | Admitting: Orthopaedic Surgery

## 2020-07-06 VITALS — BP 141/88 | HR 60 | Ht 70.0 in | Wt 240.0 lb

## 2020-07-06 DIAGNOSIS — M25512 Pain in left shoulder: Secondary | ICD-10-CM

## 2020-07-06 NOTE — Patient Instructions (Signed)
MRI scan has been ordered for you please call to schedule [phone]

## 2020-07-06 NOTE — Progress Notes (Signed)
Patient Edward Fisher, male DOB:[birth year], 54 y.o. DUK:[id number]  Chief Complaint  Patient presents with  . Shoulder Pain    left shld pain, still hurts, certain ROM causes bad pain    HPI  Edward Fisher is a 54 y.o. male who has continued pain in the left shoulder.  He has been to PT and it has helped some.  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  I will get MRI of the shoulder.   Body mass index is 34.44 kg/m.  ROS  Review of Systems  Constitutional: Positive for activity change.  Musculoskeletal: Positive for arthralgias.  All other systems reviewed and are negative.   All other systems reviewed and are negative.  The following is a summary of the past history medically, past history surgically, known current medicines, social history and family history.  This information is gathered electronically by the computer from prior information and documentation.  I review this each visit and have found including this information at this point in the chart is beneficial and informative.    Past Medical History:  Diagnosis Date  . Arteriosclerotic cardiovascular disease (ASCVD)    Onset in 04/2012; CABG in 06/2012 w/ LIMA-LAD, L Rad-CFX, SVG-PL-PDA  . Hyperlipidemia    goal LDL < 70  . Insomnia   . Nephrolithiasis     Past Surgical History:  Procedure Laterality Date  . CORONARY ARTERY BYPASS GRAFT  06/24/2012   Procedure: CORONARY ARTERY BYPASS GRAFTING (CABG);  Surgeon: [name], MD;  Location: [place];  Service: Open Heart Surgery;  Laterality: N/A;  Coronary Artery  Bypass X 4 utilizing endoscopic saphenous vein graft and  Left radial Artery Harvest   . CYSTOSCOPY W/ RETROGRADES Left 08/23/2015   Procedure: CYSTOSCOPY WITH RETROGRADE PYELOGRAM;  Surgeon: [name], MD;  [facility];  Service: Urology;  Laterality: Left;  . CYSTOSCOPY WITH URETEROSCOPY, STONE BASKETRY AND STENT PLACEMENT Left 08/23/2015   Procedure:  CYSTOSCOPY WITH URETEROSCOPY, STONE BASKET EXTRACTION;  Surgeon: [name], MD;  [facility];  Service: Urology;  Laterality: Left;  . HOLMIUM LASER APPLICATION Left 37/62/8315   Procedure: HOLMIUM LASER APPLICATION;  Surgeon: [name], MD;  [facility];  Service: Urology;  Laterality: Left;  . LEFT HEART CATHETERIZATION WITH CORONARY ANGIOGRAM N/A 06/23/2012   Procedure: LEFT HEART CATHETERIZATION WITH CORONARY ANGIOGRAM;  Surgeon: [name], MD;  Location: [facility] CATH LAB;  Service: Cardiovascular;  Laterality: N/A;  . RHINOPLASTY    . TONSILLECTOMY  ~1971    Family History  Problem Relation Age of Onset  . Hypertension Mother   . Ulcers Father        Peptic ulcer disease  . Coronary artery disease Father        Stents  . Coronary artery disease Brother        CABG    Social History Social History   Tobacco Use  . Smoking status: Never Smoker  . Smokeless tobacco: Former Systems developer    Types: Chew  . Tobacco comment: 06/22/2012 "quit chewing 10-15 years ago"  Vaping Use  . Vaping Use: Never used  Substance Use Topics  . Alcohol use: No    Alcohol/week: 0.0 standard drinks  . Drug use: No    Allergies  Allergen Reactions  . Pollen Extract Cough    Current Outpatient Medications  Medication Sig Dispense Refill  . aspirin 81 MG chewable tablet Chew 81  mg by mouth every evening. TO START DOSAGE IN 6 MONTHS APPROXIMATELY July 2014    . atorvastatin (LIPITOR) 20 MG tablet Take 1 tablet by mouth once daily 30 tablet 6  . clonazePAM (KLONOPIN) 0.5 MG tablet Take 1 tablet (0.5 mg total) by mouth at bedtime. 30 tablet 0  . metoprolol succinate (TOPROL XL) 25 MG 24 hr tablet Take 1 tablet (25 mg total) by mouth daily. 30 tablet 11  . naproxen (NAPROSYN) 500 MG tablet Take 1 tablet (500 mg total) by mouth 2 (two) times daily with a meal. 60 tablet 5  . nitroGLYCERIN (NITROSTAT) 0.4 MG SL tablet Place 1 tablet (0.4 mg total) under the tongue every 5 (five)  minutes as needed for chest pain. 25 tablet 3  . omega-3 acid ethyl esters (LOVAZA) 1 G capsule Take 1 g by mouth every evening.    [name] PARoxetine (PAXIL) 20 MG tablet Take 1 tablet by mouth once daily 30 tablet 6   No current facility-administered medications for this visit.     Physical Exam  Blood pressure (!) 141/88, pulse 60, height 5\' 10"  (1.778 m), weight 240 lb (108.9 kg).  Constitutional: overall normal hygiene, normal nutrition, well developed, normal grooming, normal body habitus. Assistive device:none  Musculoskeletal: gait and station Limp none, muscle tone and strength are normal, no tremors or atrophy is present.  .  Neurological: coordination overall normal.  Deep tendon reflex/nerve stretch intact.  Sensation normal.  Cranial nerves II-XII intact.   Skin:   Normal overall no scars, lesions, ulcers or rashes. No psoriasis.  Psychiatric: Alert and oriented x 3.  Recent memory intact, remote memory unclear.  Normal mood and affect. Well groomed.  Good eye contact.  Cardiovascular: overall no swelling, no varicosities, no edema bilaterally, normal temperatures of the legs and arms, no clubbing, cyanosis and good capillary refill.  Lymphatic: palpation is normal.  Left shoulder with full motion but pain at the extremes.  All other systems reviewed and are negative   The patient has been educated about the nature of the problem(s) and counseled on treatment options.  The patient appeared to understand what I have discussed and is in agreement with it.  Encounter Diagnosis  Name Primary?  . Acute pain of left shoulder Yes    PLAN Call if any problems.  Precautions discussed.  Continue current medications.   Return to clinic 2 weeks   Get MRI of the left shoulder.  Continue PT.  Electronically Signed Sanjuana Kava, MD 9/2/20218:24 AM

## 2020-07-07 ENCOUNTER — Encounter: Payer: Self-pay | Admitting: Physical Therapy

## 2020-07-07 ENCOUNTER — Other Ambulatory Visit: Payer: Self-pay

## 2020-07-07 ENCOUNTER — Ambulatory Visit: Payer: Self-pay | Attending: Orthopaedic Surgery | Admitting: Physical Therapy

## 2020-07-07 DIAGNOSIS — M25511 Pain in right shoulder: Secondary | ICD-10-CM | POA: Insufficient documentation

## 2020-07-07 NOTE — Therapy (Signed)
[facility] [place], [place], [address] Phone: [phone]   Fax:  [phone]  Physical Therapy Treatment  Patient Details  Name: Edward Fisher MRN: [id number] Date of Birth: 07/24/1966 Referring Provider (PT): Sanjuana Kava MD   Encounter Date: 07/07/2020   PT End of Session - 07/07/20 0737    Visit Number 4    Number of Visits 12    Date for PT Re-Evaluation 08/08/20    PT Start Time 0731    PT Stop Time 0810    PT Time Calculation (min) 39 min    Activity Tolerance Patient tolerated treatment well    Behavior During Therapy [facility] for tasks assessed/performed           Past Medical History:  Diagnosis Date  . Arteriosclerotic cardiovascular disease (ASCVD)    Onset in 04/2012; CABG in 06/2012 w/ LIMA-LAD, L Rad-CFX, SVG-PL-PDA  . Hyperlipidemia    goal LDL < 70  . Insomnia   . Nephrolithiasis     Past Surgical History:  Procedure Laterality Date  . CORONARY ARTERY BYPASS GRAFT  06/24/2012   Procedure: CORONARY ARTERY BYPASS GRAFTING (CABG);  Surgeon: [name], MD;  Location: [place];  Service: Open Heart Surgery;  Laterality: N/A;  Coronary Artery  Bypass X 4 utilizing endoscopic saphenous vein graft and  Left radial Artery Harvest   . CYSTOSCOPY W/ RETROGRADES Left 08/23/2015   Procedure: CYSTOSCOPY WITH RETROGRADE PYELOGRAM;  Surgeon: [name], MD;  [facility];  Service: Urology;  Laterality: Left;  . CYSTOSCOPY WITH URETEROSCOPY, STONE BASKETRY AND STENT PLACEMENT Left 08/23/2015   Procedure: CYSTOSCOPY WITH URETEROSCOPY, STONE BASKET EXTRACTION;  Surgeon: [name], MD;  [facility]: [facility];  Service: Urology;  Laterality: Left;  . HOLMIUM LASER APPLICATION Left 50/53/9767   Procedure: HOLMIUM LASER APPLICATION;  Surgeon: [name], MD;  [facility];  Service: Urology;  Laterality: Left;  . LEFT HEART CATHETERIZATION WITH CORONARY ANGIOGRAM N/A 06/23/2012   Procedure:  LEFT HEART CATHETERIZATION WITH CORONARY ANGIOGRAM;  Surgeon: [name], MD;  Location: [facility] CATH LAB;  Service: Cardiovascular;  Laterality: N/A;  . RHINOPLASTY    . TONSILLECTOMY  ~1971    There were no vitals filed for this visit.   Subjective Assessment - 07/07/20 0736    Subjective COVID-19 screen performed prior to patient entering clinic. Reports less pain but still has pain and discomfort with intermittant motions just superior to spine of the scapula.    Pertinent History ASCVD, CABG.    Patient Stated Goals Use left UE without pain.    Currently in Pain? No/denies              [facility] PT Assessment - 07/07/20 0001      Assessment   Medical Diagnosis Acute pain of left shoulder.    Referring Provider (PT) Sanjuana Kava MD      Precautions   Precautions None      Restrictions   Weight Bearing Restrictions No                         [facility] Adult PT Treatment/Exercise - 07/07/20 0001      Shoulder Exercises: ROM/Strengthening   UBE (Upper Arm Bike) 120 RPM's x 8 minutes.      Modalities   Modalities Corporate treasurer Location L posterior shoulder    Chartered certified accountant  Pre-mod    Electrical Stimulation Parameters 80-150 hz x10 min    Electrical Stimulation Goals Pain      Ultrasound   Ultrasound Location L posterior shoulder    Ultrasound Parameters Combo 1.5 w/cm2, 100%, 1 mhz x10 min    Ultrasound Goals Pain      Vasopneumatic   Number Minutes Vasopneumatic  10 minutes    Vasopnuematic Location  Shoulder    Vasopneumatic Pressure Low    Vasopneumatic Temperature  34      Manual Therapy   Manual Therapy Myofascial release    Myofascial Release IASTW to L supraspinatus, infraspinatus, teres to reduce tone                       PT Long Term Goals - 06/29/20 1027      PT LONG TERM GOAL #1   Title Ind with HEP.    Time 6     Period Weeks    Status New      PT LONG TERM GOAL #2   Title Perform ADL's with pain not > 2/10.    Time 6    Period Weeks    Status New                 Plan - 07/07/20 0806    Clinical Impression Statement Patient presented in clinic with less pain of L shoulder but still experiencing some discomfort with intermittant activities. Patient experiencing more discomfort in posterior shoulder superior to scapular spine. Patient able to tolerate IASTW session well with good redness response. Patient educated that redness should dissipate and soreness may present. Normal modalities response noted following removal of the modalities.    Personal Factors and Comorbidities Comorbidity 1    Comorbidities Bypass 2013    Examination-Activity Limitations Other    Examination-Participation Restrictions Other    Stability/Clinical Decision Making Stable/Uncomplicated    Rehab Potential Excellent    PT Frequency 2x / week    PT Duration 6 weeks    PT Treatment/Interventions ADLs/Self Care Home Management;Cryotherapy;Electrical Stimulation;Ultrasound;Moist Heat;Iontophoresis 4mg /ml Dexamethasone;Therapeutic exercise;Therapeutic activities;Manual techniques;Patient/family education;Vasopneumatic Device    PT Next Visit Plan Pulse combo e'stim/US; STW/M, vaso and e'stim.  RW4, UBE (non-painful).    Consulted and Agree with Plan of Care Patient           Patient will benefit from skilled therapeutic intervention in order to improve the following deficits and impairments:  Pain, Decreased activity tolerance  Visit Diagnosis: Acute pain of right shoulder     Problem List Patient Active Problem List   Diagnosis Date Noted  . Tremor of right hand 05/02/2020  . Uncontrolled REM sleep behavior disorder 05/02/2020  . Hemiparesis of right dominant side ([place]) 05/02/2020  . Chest pain 06/22/2014  . Essential hypertension 06/22/2014  . Belching 06/22/2014  . Local skin infection 03/16/2013  .  Arteriosclerotic cardiovascular disease (ASCVD) 06/26/2012  . Hyperlipidemia     Standley Brooking, PTA 07/07/2020, 8:21 AM  [facility] [address] [place], [place], [address] Phone: [phone]   Fax:  [phone]  Name: Edward Fisher MRN: [id number] Date of Birth: Jan 27, 1966

## 2020-07-12 ENCOUNTER — Other Ambulatory Visit: Payer: Self-pay

## 2020-07-12 ENCOUNTER — Encounter: Payer: Self-pay | Admitting: Physical Therapy

## 2020-07-12 ENCOUNTER — Ambulatory Visit: Payer: Self-pay | Admitting: Physical Therapy

## 2020-07-12 DIAGNOSIS — M25511 Pain in right shoulder: Secondary | ICD-10-CM

## 2020-07-12 NOTE — Therapy (Signed)
[facility] [place], [place], [address] Phone: [phone]   Fax:  [phone]  Physical Therapy Treatment  Patient Details  Name: Edward Fisher MRN: [id number] Date of Birth: September 09, 1966 Referring Provider (PT): Sanjuana Kava MD   Encounter Date: 07/12/2020   PT End of Session - 07/12/20 0733    Visit Number 5    Number of Visits 12    Date for PT Re-Evaluation 08/08/20    PT Start Time 0731    PT Stop Time 0813    PT Time Calculation (min) 42 min    Activity Tolerance Patient tolerated treatment well    Behavior During Therapy [facility] for tasks assessed/performed           Past Medical History:  Diagnosis Date   Arteriosclerotic cardiovascular disease (ASCVD)    Onset in 04/2012; CABG in 06/2012 w/ LIMA-LAD, L Rad-CFX, SVG-PL-PDA   Hyperlipidemia    goal LDL < 70   Insomnia    Nephrolithiasis     Past Surgical History:  Procedure Laterality Date   CORONARY ARTERY BYPASS GRAFT  06/24/2012   Procedure: CORONARY ARTERY BYPASS GRAFTING (CABG);  Surgeon: [name], MD;  Location: [place];  Service: Open Heart Surgery;  Laterality: N/A;  Coronary Artery  Bypass X 4 utilizing endoscopic saphenous vein graft and  Left radial Artery Harvest    CYSTOSCOPY W/ RETROGRADES Left 08/23/2015   Procedure: CYSTOSCOPY WITH RETROGRADE PYELOGRAM;  Surgeon: [name], MD;  [facility];  Service: Urology;  Laterality: Left;   CYSTOSCOPY WITH URETEROSCOPY, STONE BASKETRY AND STENT PLACEMENT Left 08/23/2015   Procedure: CYSTOSCOPY WITH URETEROSCOPY, STONE BASKET EXTRACTION;  Surgeon: [name], MD;  [facility];  Service: Urology;  Laterality: Left;   HOLMIUM LASER APPLICATION Left 25/36/6440   Procedure: HOLMIUM LASER APPLICATION;  Surgeon: [name], MD;  [facility];  Service: Urology;  Laterality: Left;   LEFT HEART CATHETERIZATION WITH CORONARY ANGIOGRAM N/A 06/23/2012   Procedure:  LEFT HEART CATHETERIZATION WITH CORONARY ANGIOGRAM;  Surgeon: [name], MD;  Location: [facility] CATH LAB;  Service: Cardiovascular;  Laterality: N/A;   RHINOPLASTY     TONSILLECTOMY  ~1971    There were no vitals filed for this visit.   Subjective Assessment - 07/12/20 0732    Subjective COVID 19 screening performed on patient upon arrival. Patient reports that at times he has pain but at other times he doesn't.    Pertinent History ASCVD, CABG.    Patient Stated Goals Use left UE without pain.    Currently in Pain? No/denies              [facility] PT Assessment - 07/12/20 0001      Assessment   Medical Diagnosis Acute pain of left shoulder.    Referring Provider (PT) Sanjuana Kava MD      Precautions   Precautions None      Restrictions   Weight Bearing Restrictions No                         [facility] Adult PT Treatment/Exercise - 07/12/20 0001      Shoulder Exercises: Standing   Protraction Strengthening;Left;20 reps;Theraband    Theraband Level (Shoulder Protraction) Level 1 (Yellow)    External Rotation Strengthening;Left;20 reps;Theraband    Theraband Level (Shoulder External Rotation) Level 1 (Yellow)    Internal Rotation Strengthening;Left;20 reps;Theraband    Theraband Level (Shoulder Internal Rotation)  Level 1 (Yellow)    Extension Strengthening;Left;20 reps;Theraband    Theraband Level (Shoulder Extension) Level 1 (Yellow)    Row Strengthening;Left;20 reps;Theraband    Theraband Level (Shoulder Row) Level 1 (Yellow)    Other Standing Exercises LUE wall slides in ER x20 reps      Shoulder Exercises: ROM/Strengthening   UBE (Upper Arm Bike) 90 RPM's x 8 minutes.      Modalities   Modalities Ultrasound;Vasopneumatic      Ultrasound   Ultrasound Location L posterior shoulder/ deltoids    Ultrasound Parameters Combo 1.5 w/cm2, 100%, 77mhz x10 min    Ultrasound Goals Pain      Vasopneumatic   Number Minutes Vasopneumatic  10 minutes     Vasopnuematic Location  Shoulder    Vasopneumatic Pressure Low    Vasopneumatic Temperature  34                       PT Long Term Goals - 06/29/20 1027      PT LONG TERM GOAL #1   Title Ind with HEP.    Time 6    Period Weeks    Status New      PT LONG TERM GOAL #2   Title Perform ADL's with pain not > 2/10.    Time 6    Period Weeks    Status New                 Plan - 07/12/20 9528    Clinical Impression Statement Patient presented in clinic with reports of only intermittant UE discomfort. Patient progressed to light strengthening with min difficulty with ER due to pain. Patient required mod multimodal cueing to ensure proper technique. Normal modalities response noted following removal of the modalities. No negetive complaints at end of treatment.    Personal Factors and Comorbidities Comorbidity 1    Comorbidities Bypass 2013    Examination-Activity Limitations Other    Examination-Participation Restrictions Other    Stability/Clinical Decision Making Stable/Uncomplicated    Rehab Potential Excellent    PT Frequency 2x / week    PT Duration 6 weeks    PT Treatment/Interventions ADLs/Self Care Home Management;Cryotherapy;Electrical Stimulation;Ultrasound;Moist Heat;Iontophoresis 4mg /ml Dexamethasone;Therapeutic exercise;Therapeutic activities;Manual techniques;Patient/family education;Vasopneumatic Device    PT Next Visit Plan Pulse combo e'stim/US; STW/M, vaso and e'stim.  RW4, UBE (non-painful).    Consulted and Agree with Plan of Care Patient           Patient will benefit from skilled therapeutic intervention in order to improve the following deficits and impairments:  Pain, Decreased activity tolerance  Visit Diagnosis: Acute pain of right shoulder     Problem List Patient Active Problem List   Diagnosis Date Noted   Tremor of right hand 05/02/2020   Uncontrolled REM sleep behavior disorder 05/02/2020   Hemiparesis of right dominant  side ([place]) 05/02/2020   Chest pain 06/22/2014   Essential hypertension 06/22/2014   Belching 06/22/2014   Local skin infection 03/16/2013   Arteriosclerotic cardiovascular disease (ASCVD) 06/26/2012   Hyperlipidemia     Standley Brooking, PTA 07/12/2020, 8:30 AM  [facility] [address] [place], [place], [address] Phone: [phone]   Fax:  [phone]  Name: Edward Fisher MRN: [id number] Date of Birth: Sep 24, 1966

## 2020-07-14 ENCOUNTER — Ambulatory Visit (HOSPITAL_COMMUNITY)
Admission: RE | Admit: 2020-07-14 | Discharge: 2020-07-14 | Disposition: A | Discharge status: Home / Self Care | Payer: Self-pay | Source: Ambulatory Visit | Attending: Orthopaedic Surgery | Admitting: Orthopaedic Surgery

## 2020-07-14 ENCOUNTER — Other Ambulatory Visit: Payer: Self-pay

## 2020-07-14 ENCOUNTER — Encounter (HOSPITAL_COMMUNITY): Payer: Self-pay

## 2020-07-14 DIAGNOSIS — M25512 Pain in left shoulder: Secondary | ICD-10-CM

## 2020-07-17 ENCOUNTER — Telehealth: Payer: Self-pay | Admitting: Orthopaedic Surgery

## 2020-07-17 ENCOUNTER — Encounter: Payer: Self-pay | Admitting: Physical Therapy

## 2020-07-17 ENCOUNTER — Other Ambulatory Visit: Payer: Self-pay

## 2020-07-17 ENCOUNTER — Ambulatory Visit: Payer: Self-pay | Admitting: Physical Therapy

## 2020-07-17 DIAGNOSIS — M25511 Pain in right shoulder: Secondary | ICD-10-CM

## 2020-07-17 NOTE — Therapy (Signed)
[facility] [place], [place], [address] Phone: [phone]   Fax:  [phone]  Physical Therapy Treatment  Patient Details  Name: Edward Fisher MRN: [id number] Date of Birth: 1966-10-26 Referring Provider (PT): Sanjuana Kava MD   Encounter Date: 07/17/2020   PT End of Session - 07/17/20 0807    Visit Number 6    Number of Visits 12    Date for PT Re-Evaluation 08/08/20    PT Start Time 0734    PT Stop Time 0816    PT Time Calculation (min) 42 min    Activity Tolerance Patient tolerated treatment well    Behavior During Therapy [facility] for tasks assessed/performed           Past Medical History:  Diagnosis Date  . Arteriosclerotic cardiovascular disease (ASCVD)    Onset in 04/2012; CABG in 06/2012 w/ LIMA-LAD, L Rad-CFX, SVG-PL-PDA  . Hyperlipidemia    goal LDL < 70  . Insomnia   . Nephrolithiasis     Past Surgical History:  Procedure Laterality Date  . CORONARY ARTERY BYPASS GRAFT  06/24/2012   Procedure: CORONARY ARTERY BYPASS GRAFTING (CABG);  Surgeon: [name], MD;  Location: [place];  Service: Open Heart Surgery;  Laterality: N/A;  Coronary Artery  Bypass X 4 utilizing endoscopic saphenous vein graft and  Left radial Artery Harvest   . CYSTOSCOPY W/ RETROGRADES Left 08/23/2015   Procedure: CYSTOSCOPY WITH RETROGRADE PYELOGRAM;  Surgeon: [name], MD;  [facility];  Service: Urology;  Laterality: Left;  . CYSTOSCOPY WITH URETEROSCOPY, STONE BASKETRY AND STENT PLACEMENT Left 08/23/2015   Procedure: CYSTOSCOPY WITH URETEROSCOPY, STONE BASKET EXTRACTION;  Surgeon: [name], MD;  [facility];  Service: Urology;  Laterality: Left;  . HOLMIUM LASER APPLICATION Left 88/50/2774   Procedure: HOLMIUM LASER APPLICATION;  Surgeon: [name], MD;  [facility];  Service: Urology;  Laterality: Left;  . LEFT HEART CATHETERIZATION WITH CORONARY ANGIOGRAM N/A 06/23/2012   Procedure:  LEFT HEART CATHETERIZATION WITH CORONARY ANGIOGRAM;  Surgeon: [name], MD;  Location: [facility] CATH LAB;  Service: Cardiovascular;  Laterality: N/A;  . RHINOPLASTY    . TONSILLECTOMY  ~1971    There were no vitals filed for this visit.   Subjective Assessment - 07/17/20 0742    Subjective COVID 19 screening performed on patient upon arrival. Patient reports ongoing pain and soreness in left shoulder.    Pertinent History ASCVD, CABG.    Patient Stated Goals Use left UE without pain.    Currently in Pain? Yes    Pain Score 4     Pain Location Shoulder    Pain Orientation Left    Pain Descriptors / Indicators Sore    Pain Type Acute pain    Pain Onset More than a month ago    Pain Frequency Constant              [facility] PT Assessment - 07/17/20 0001      Assessment   Medical Diagnosis Acute pain of left shoulder.    Referring Provider (PT) Sanjuana Kava MD    Next MD Visit To be scheduled      Precautions   Precautions None      Restrictions   Weight Bearing Restrictions No                         [facility] Adult PT Treatment/Exercise - 07/17/20 0001  Shoulder Exercises: Standing   Protraction Strengthening;Left;20 reps;Theraband    Theraband Level (Shoulder Protraction) Level 1 (Yellow)    External Rotation Strengthening;Left;20 reps;Theraband    Theraband Level (Shoulder External Rotation) Level 1 (Yellow)    Internal Rotation Strengthening;Left;20 reps;Theraband    Theraband Level (Shoulder Internal Rotation) Level 1 (Yellow)    Extension Strengthening;Left;20 reps;Theraband    Theraband Level (Shoulder Extension) Level 1 (Yellow)    Row Strengthening;Left;20 reps;Theraband    Theraband Level (Shoulder Row) Level 1 (Yellow)    Diagonals Strengthening;20 reps    Diagonals Limitations Green XTS      Shoulder Exercises: ROM/Strengthening   UBE (Upper Arm Bike) 90 RPM's x 8 minutes.      Modalities   Modalities Ultrasound;Vasopneumatic       Ultrasound   Ultrasound Location L posterior shoulder    Ultrasound Parameters combo 1.5 w/cm2 100% 1.5 w/cm2 x10 mins    Ultrasound Goals Pain      Vasopneumatic   Number Minutes Vasopneumatic  10 minutes    Vasopnuematic Location  Shoulder    Vasopneumatic Pressure Low    Vasopneumatic Temperature  34                       PT Long Term Goals - 07/17/20 0810      PT LONG TERM GOAL #1   Title Ind with HEP.    Time 6    Period Weeks    Status On-going      PT LONG TERM GOAL #2   Title Perform ADL's with pain not > 2/10.    Time 6    Period Weeks    Status On-going                 Plan - 07/17/20 0808    Clinical Impression Statement Patient responded well to therapy session despite 4/10 pain in left posterior shoulder. Patient guided through TEs to which he required demonstration and verbal cuing for proper form and technique. Patient demonstrated improved form for remaining reps. Combo performed to L posterior shoulder with no adverse effects. Normal response to modalities upon removal with reports of a decrease of left shoulder pain    Personal Factors and Comorbidities Comorbidity 1    Comorbidities Bypass 2013    Examination-Activity Limitations Other    Examination-Participation Restrictions Other    Stability/Clinical Decision Making Stable/Uncomplicated    Clinical Decision Making Low    Rehab Potential Excellent    PT Frequency 2x / week    PT Duration 6 weeks    PT Treatment/Interventions ADLs/Self Care Home Management;Cryotherapy;Electrical Stimulation;Ultrasound;Moist Heat;Iontophoresis 4mg /ml Dexamethasone;Therapeutic exercise;Therapeutic activities;Manual techniques;Patient/family education;Vasopneumatic Device    PT Next Visit Plan continue with POC consisting ofPulse combo e'stim/US; STW/M, vaso and e'stim.  RW4, UBE (non-painful).    Consulted and Agree with Plan of Care Patient           Patient will benefit from skilled therapeutic  intervention in order to improve the following deficits and impairments:  Pain, Decreased activity tolerance  Visit Diagnosis: Acute pain of right shoulder     Problem List Patient Active Problem List   Diagnosis Date Noted  . Tremor of right hand 05/02/2020  . Uncontrolled REM sleep behavior disorder 05/02/2020  . Hemiparesis of right dominant side ([place]) 05/02/2020  . Chest pain 06/22/2014  . Essential hypertension 06/22/2014  . Belching 06/22/2014  . Local skin infection 03/16/2013  . Arteriosclerotic cardiovascular disease (ASCVD) 06/26/2012  . Hyperlipidemia  Gabriela Eves, PT, DPT 07/17/2020, 8:26 AM  [facility] [address] [place], [place], [address] Phone: [phone]   Fax:  [phone]  Name: Edward Fisher MRN: [id number] Date of Birth: February 09, 1966

## 2020-07-17 NOTE — Telephone Encounter (Signed)
Patient called via voice mes - states was unable to complete MRI as scheduled at [facility] Friday, 07/14/20. Mentioned "has heard of open MRI".  Please note, patient has a 75% [employer] financial discount which applies to [facility] and [facility] imaging facilities. Please advise.

## 2020-07-18 NOTE — Telephone Encounter (Signed)
Referral changed to GI and information given to pt to schedule.

## 2020-07-20 ENCOUNTER — Ambulatory Visit: Payer: No Typology Code available for payment source | Admitting: Orthopaedic Surgery

## 2020-07-21 ENCOUNTER — Encounter: Payer: Self-pay | Admitting: Physical Therapy

## 2020-07-21 ENCOUNTER — Ambulatory Visit: Payer: Self-pay | Admitting: Physical Therapy

## 2020-07-21 ENCOUNTER — Other Ambulatory Visit (HOSPITAL_COMMUNITY)
Admission: RE | Admit: 2020-07-21 | Discharge: 2020-07-21 | Disposition: A | Discharge status: Home / Self Care | Payer: Self-pay | Source: Ambulatory Visit | Attending: Physician Assistant | Admitting: Physician Assistant

## 2020-07-21 ENCOUNTER — Other Ambulatory Visit: Payer: Self-pay

## 2020-07-21 DIAGNOSIS — R7989 Other specified abnormal findings of blood chemistry: Secondary | ICD-10-CM | POA: Insufficient documentation

## 2020-07-21 DIAGNOSIS — I10 Essential (primary) hypertension: Secondary | ICD-10-CM | POA: Insufficient documentation

## 2020-07-21 DIAGNOSIS — M25511 Pain in right shoulder: Secondary | ICD-10-CM

## 2020-07-21 DIAGNOSIS — I2510 Atherosclerotic heart disease of native coronary artery without angina pectoris: Secondary | ICD-10-CM | POA: Insufficient documentation

## 2020-07-21 DIAGNOSIS — E785 Hyperlipidemia, unspecified: Secondary | ICD-10-CM | POA: Insufficient documentation

## 2020-07-21 LAB — COMPREHENSIVE METABOLIC PANEL
ALT: 63 U/L — ABNORMAL HIGH (ref 0–44)
AST: 38 U/L (ref 15–41)
Albumin: 4.2 g/dL (ref 3.5–5.0)
Alkaline Phosphatase: 59 U/L (ref 38–126)
Anion gap: 7 (ref 5–15)
BUN: 19 mg/dL (ref 6–20)
CO2: 24 mmol/L (ref 22–32)
Calcium: 9 mg/dL (ref 8.9–10.3)
Chloride: 108 mmol/L (ref 98–111)
Creatinine, Ser: 1 mg/dL (ref 0.61–1.24)
GFR calc Af Amer: >60 mL/min (ref >60)
GFR calc non Af Amer: >60 mL/min (ref >60)
Glucose, Bld: 118 mg/dL — ABNORMAL HIGH (ref 70–99)
Potassium: 3.7 mmol/L (ref 3.5–5.1)
Sodium: 139 mmol/L (ref 135–145)
Total Bilirubin: 1 mg/dL (ref 0.3–1.2)
Total Protein: 7.3 g/dL (ref 6.5–8.1)

## 2020-07-21 LAB — LIPID PANEL
Cholesterol: 126 mg/dL (ref 0–200)
HDL: 26 mg/dL — ABNORMAL LOW (ref >40)
LDL Cholesterol: 75 mg/dL (ref 0–99)
Total CHOL/HDL Ratio: 4.8 RATIO
Triglycerides: 125 mg/dL (ref <150)
VLDL: 25 mg/dL (ref 0–40)

## 2020-07-21 NOTE — Therapy (Signed)
[facility] [place], [place], [address] Phone: [phone]   Fax:  [phone]  Physical Therapy Treatment  Patient Details  Name: Edward Fisher MRN: [id number] Date of Birth: Feb 21, 1966 Referring Provider (PT): Sanjuana Kava MD   Encounter Date: 07/21/2020   PT End of Session - 07/21/20 0741    Visit Number 7    Number of Visits 12    Date for PT Re-Evaluation 08/08/20    PT Start Time 0735    PT Stop Time 0815    PT Time Calculation (min) 40 min    Activity Tolerance Patient tolerated treatment well    Behavior During Therapy [facility] for tasks assessed/performed           Past Medical History:  Diagnosis Date  . Arteriosclerotic cardiovascular disease (ASCVD)    Onset in 04/2012; CABG in 06/2012 w/ LIMA-LAD, L Rad-CFX, SVG-PL-PDA  . Hyperlipidemia    goal LDL < 70  . Insomnia   . Nephrolithiasis     Past Surgical History:  Procedure Laterality Date  . CORONARY ARTERY BYPASS GRAFT  06/24/2012   Procedure: CORONARY ARTERY BYPASS GRAFTING (CABG);  Surgeon: [name], MD;  Location: [facility];  Service: Open Heart Surgery;  Laterality: N/A;  Coronary Artery  Bypass X 4 utilizing endoscopic saphenous vein graft and  Left radial Artery Harvest   . CYSTOSCOPY W/ RETROGRADES Left 08/23/2015   Procedure: CYSTOSCOPY WITH RETROGRADE PYELOGRAM;  Surgeon: [name], MD;  Location: [facility];  Service: Urology;  Laterality: Left;  . CYSTOSCOPY WITH URETEROSCOPY, STONE BASKETRY AND STENT PLACEMENT Left 08/23/2015   Procedure: CYSTOSCOPY WITH URETEROSCOPY, STONE BASKET EXTRACTION;  Surgeon: [name], MD;  Location: [facility];  Service: Urology;  Laterality: Left;  . HOLMIUM LASER APPLICATION Left 37/90/2409   Procedure: HOLMIUM LASER APPLICATION;  Surgeon: [name], MD;  Location: [facility];  Service: Urology;  Laterality: Left;  . LEFT HEART CATHETERIZATION WITH CORONARY ANGIOGRAM N/A 06/23/2012   Procedure:  LEFT HEART CATHETERIZATION WITH CORONARY ANGIOGRAM;  Surgeon: [name], MD;  Location: [facility];  Service: Cardiovascular;  Laterality: N/A;  . RHINOPLASTY    . TONSILLECTOMY  ~1971    There were no vitals filed for this visit.   Subjective Assessment - 07/21/20 0741    Subjective COVID 19 screening performed on patient upon arrival. Patient reports "weird" feeling of L shoulder. No current L shoulder pain. Patient reports his open MRI is scheduled for 08/11/2020.    Pertinent History ASCVD, CABG.    Patient Stated Goals Use left UE without pain.    Currently in Pain? No/denies              [facility] PT Assessment - 07/21/20 0001      Assessment   Medical Diagnosis Acute pain of left shoulder.    Referring Provider (PT) Sanjuana Kava MD    Next MD Visit To be scheduled      Precautions   Precautions None      Restrictions   Weight Bearing Restrictions No                         [facility] Adult PT Treatment/Exercise - 07/21/20 0001      Shoulder Exercises: Prone   Retraction Strengthening;Left;20 reps;Weights    Retraction Weight (lbs) 3    Extension Strengthening;Left;20 reps;Weights    Extension Weight (lbs) 3      Shoulder Exercises:  Standing   Protraction Strengthening;Left;20 reps;Theraband    Theraband Level (Shoulder Protraction) Level 2 (Red)    Horizontal ABduction Strengthening;Both;20 reps;Theraband    Theraband Level (Shoulder Horizontal ABduction) Level 2 (Red)    External Rotation Strengthening;Left;20 reps;Theraband    Theraband Level (Shoulder External Rotation) Level 2 (Red)    Internal Rotation Strengthening;Left;20 reps;Theraband    Theraband Level (Shoulder Internal Rotation) Level 2 (Red)    Flexion Strengthening;Left;20 reps;Weights    Shoulder Flexion Weight (lbs) 2    Extension Strengthening;Left;20 reps;Theraband    Theraband Level (Shoulder Extension) Level 2 (Red)    Row Strengthening;Left;20 reps;Theraband    Theraband  Level (Shoulder Row) Level 2 (Red)    Diagonals Strengthening;Left;20 reps;Theraband    Theraband Level (Shoulder Diagonals) Level 2 (Red)    Diagonals Limitations D1 and D2 each    Other Standing Exercises L shoulder scaption 2# x20 rpes      Shoulder Exercises: ROM/Strengthening   UBE (Upper Arm Bike) 90 RPM's x 8 minutes.      Modalities   Modalities Vasopneumatic      Vasopneumatic   Number Minutes Vasopneumatic  15 minutes    Vasopnuematic Location  Shoulder    Vasopneumatic Pressure Low    Vasopneumatic Temperature  34/edema                       PT Long Term Goals - 07/17/20 0810      PT LONG TERM GOAL #1   Title Ind with HEP.    Time 6    Period Weeks    Status On-going      PT LONG TERM GOAL #2   Title Perform ADL's with pain not > 2/10.    Time 6    Period Weeks    Status On-going                 Plan - 07/21/20 0805    Clinical Impression Statement Patient presented in clinic with reports of only intermittant L shoulder pain. Patient's L shoulder observed with area of inflammation in anterior L shoulder and on superior most region of L shoulder. The superior most area of inflammation is more firm and was more tender per patient report. Patient able to complete all therex as directed with only minimal discomfort per patient report. Patient lifts heavy objects throughout the day and states that sometimes by the end of the day his LUE is very tired. Normal vasopneumatic response noted following removal of the modality.    Personal Factors and Comorbidities Comorbidity 1    Comorbidities Bypass 2013    Examination-Activity Limitations Other    Examination-Participation Restrictions Other    Stability/Clinical Decision Making Stable/Uncomplicated    Rehab Potential Excellent    PT Frequency 2x / week    PT Duration 6 weeks    PT Treatment/Interventions ADLs/Self Care Home Management;Cryotherapy;Electrical Stimulation;Ultrasound;Moist  Heat;Iontophoresis 4mg /ml Dexamethasone;Therapeutic exercise;Therapeutic activities;Manual techniques;Patient/family education;Vasopneumatic Device    PT Next Visit Plan Continue per POC as symptoms dictate.    Consulted and Agree with Plan of Care Patient           Patient will benefit from skilled therapeutic intervention in order to improve the following deficits and impairments:  Pain, Decreased activity tolerance  Visit Diagnosis: Acute pain of right shoulder     Problem List Patient Active Problem List   Diagnosis Date Noted  . Tremor of right hand 05/02/2020  . Uncontrolled REM sleep behavior disorder 05/02/2020  .  Hemiparesis of right dominant side ([place]) 05/02/2020  . Chest pain 06/22/2014  . Essential hypertension 06/22/2014  . Belching 06/22/2014  . Local skin infection 03/16/2013  . Arteriosclerotic cardiovascular disease (ASCVD) 06/26/2012  . Hyperlipidemia     Standley Brooking, PTA 07/21/2020, 8:19 AM  [facility] [address] [place], [place], [address] Phone: [phone]   Fax:  [phone]  Name: Edward Fisher MRN: [id number] Date of Birth: 12-30-1965

## 2020-07-24 ENCOUNTER — Ambulatory Visit: Payer: Self-pay | Admitting: Physician Assistant

## 2020-07-24 ENCOUNTER — Other Ambulatory Visit: Payer: Self-pay

## 2020-07-24 ENCOUNTER — Encounter: Payer: Self-pay | Admitting: Physician Assistant

## 2020-07-24 VITALS — BP 110/60 | HR 80 | Temp 98.1°F | Ht 70.0 in | Wt 221.4 lb

## 2020-07-24 DIAGNOSIS — I2510 Atherosclerotic heart disease of native coronary artery without angina pectoris: Secondary | ICD-10-CM

## 2020-07-24 DIAGNOSIS — E785 Hyperlipidemia, unspecified: Secondary | ICD-10-CM

## 2020-07-24 DIAGNOSIS — I10 Essential (primary) hypertension: Secondary | ICD-10-CM

## 2020-07-24 DIAGNOSIS — Z23 Encounter for immunization: Secondary | ICD-10-CM

## 2020-07-24 DIAGNOSIS — Z951 Presence of aortocoronary bypass graft: Secondary | ICD-10-CM

## 2020-07-24 DIAGNOSIS — F419 Anxiety disorder, unspecified: Secondary | ICD-10-CM

## 2020-07-24 DIAGNOSIS — R7989 Other specified abnormal findings of blood chemistry: Secondary | ICD-10-CM

## 2020-07-24 MED ORDER — CITALOPRAM HYDROBROMIDE 20 MG PO TABS
20.0000 mg | ORAL_TABLET | Freq: Every day | ORAL | 1 refills | Status: DC
Start: 2020-07-24 — End: 2020-08-21

## 2020-07-24 NOTE — Progress Notes (Signed)
BP 110/60    Pulse 80    Temp 98.1 F (36.7 C)    Ht 5\' 10"  (1.778 m)    Wt 221 lb 6.4 oz (100.4 kg)    SpO2 99%    BMI 31.77 kg/m    Subjective:    Patient ID: Edward Fisher, male    DOB: 1966/01/25, 54 y.o.   MRN: [id number]  HPI: Edward Fisher is a 54 y.o. male presenting on 07/24/2020 for Follow-up   HPI   Pt had a negative covid 19 screening questionnaire.    Pt is 47yoM with CAD s/p CABG and anxiety who has routine appointment today.  Pt's wife just died from covid about a month ago.  He is sleeping okay.   He still cries a lot.  He is back at work.  He has strong family support.  He denies SI.  Despite the death of his wife, pt has still not received covid vaccination.  He says he just isn't sure.  He does admit that if he could go back and get vaccinated if it might have saved his wife, he would.       Relevant past medical, surgical, family and social history reviewed and updated as indicated. Interim medical history since our last visit reviewed. Allergies and medications reviewed and updated.   Current Outpatient Medications:    aspirin 81 MG chewable tablet, Chew 81 mg by mouth every evening. TO START DOSAGE IN 6 MONTHS APPROXIMATELY July 2014, Disp: , Rfl:    atorvastatin (LIPITOR) 20 MG tablet, Take 1 tablet by mouth once daily, Disp: 30 tablet, Rfl: 6   metoprolol succinate (TOPROL XL) 25 MG 24 hr tablet, Take 1 tablet (25 mg total) by mouth daily., Disp: 30 tablet, Rfl: 11   naproxen (NAPROSYN) 500 MG tablet, Take 1 tablet (500 mg total) by mouth 2 (two) times daily with a meal., Disp: 60 tablet, Rfl: 5   nitroGLYCERIN (NITROSTAT) 0.4 MG SL tablet, Place 1 tablet (0.4 mg total) under the tongue every 5 (five) minutes as needed for chest pain., Disp: 25 tablet, Rfl: 3   omega-3 acid ethyl esters (LOVAZA) 1 G capsule, Take 1 g by mouth every evening., Disp: , Rfl:     Review of Systems  Per HPI unless specifically indicated above     Objective:    BP  110/60    Pulse 80    Temp 98.1 F (36.7 C)    Ht 5\' 10"  (1.778 m)    Wt 221 lb 6.4 oz (100.4 kg)    SpO2 99%    BMI 31.77 kg/m   Wt Readings from Last 3 Encounters:  07/24/20 221 lb 6.4 oz (100.4 kg)  07/06/20 240 lb (108.9 kg)  06/26/20 225 lb (102.1 kg)    Physical Exam Vitals reviewed.  Constitutional:      General: He is not in acute distress.    Appearance: He is well-developed. He is not ill-appearing.  HENT:     Head: Normocephalic and atraumatic.  Cardiovascular:     Rate and Rhythm: Normal rate and regular rhythm.  Pulmonary:     Effort: Pulmonary effort is normal.     Breath sounds: Normal breath sounds. No wheezing.  Abdominal:     General: Bowel sounds are normal.     Palpations: Abdomen is soft.     Tenderness: There is no abdominal tenderness.  Musculoskeletal:     Cervical back: Neck supple.  Right lower leg: No edema.     Left lower leg: No edema.  Lymphadenopathy:     Cervical: No cervical adenopathy.  Skin:    General: Skin is warm and dry.  Neurological:     Mental Status: He is alert and oriented to person, place, and time.  Psychiatric:        Attention and Perception: Attention normal.        Mood and Affect: Affect is not inappropriate.        Speech: Speech normal.        Behavior: Behavior normal. Behavior is cooperative.     Results for orders placed or performed during the hospital encounter of 07/21/20  Lipid panel  Result Value Ref Range   Cholesterol 126 0 - 200 mg/dL   Triglycerides 125 <150 mg/dL   HDL 26 (L) >40 mg/dL   Total CHOL/HDL Ratio 4.8 RATIO   VLDL 25 0 - 40 mg/dL   LDL Cholesterol 75 0 - 99 mg/dL  Comprehensive metabolic panel  Result Value Ref Range   Sodium 139 135 - 145 mmol/L   Potassium 3.7 3.5 - 5.1 mmol/L   Chloride 108 98 - 111 mmol/L   CO2 24 22 - 32 mmol/L   Glucose, Bld 118 (H) 70 - 99 mg/dL   BUN 19 6 - 20 mg/dL   Creatinine, Ser 1.00 0.61 - 1.24 mg/dL   Calcium 9.0 8.9 - 10.3 mg/dL   Total Protein  7.3 6.5 - 8.1 g/dL   Albumin 4.2 3.5 - 5.0 g/dL   AST 38 15 - 41 U/L   ALT 63 (H) 0 - 44 U/L   Alkaline Phosphatase 59 38 - 126 U/L   Total Bilirubin 1.0 0.3 - 1.2 mg/dL   GFR calc non Af Amer >60 >60 mL/min   GFR calc Af Amer >60 >60 mL/min   Anion gap 7 5 - 15      Assessment & Plan:    Encounter Diagnoses  Name Primary?   Arteriosclerotic cardiovascular disease (ASCVD) Yes   S/P CABG (coronary artery bypass graft)    Essential hypertension    Hyperlipidemia, unspecified hyperlipidemia type    Anxiety    Elevated LFTs    Need for Tdap vaccination      -reviewed labs with pt -will rx Citalopram.  His paxil was discontinued after sleep study due to it may have been causing periodic limb movement disorder -pt to continue his other medications -Tdap updated today -educated and encouraged pt to get covid vaccination -Follow up 4 weeks to recheck mood on citalopram.  He is to contact office sooner prn

## 2020-07-24 NOTE — Patient Instructions (Signed)

## 2020-07-25 ENCOUNTER — Ambulatory Visit: Payer: Self-pay | Admitting: Orthopaedic Surgery

## 2020-07-28 ENCOUNTER — Ambulatory Visit: Payer: Self-pay | Admitting: Physical Therapy

## 2020-07-28 ENCOUNTER — Encounter: Payer: Self-pay | Admitting: Physical Therapy

## 2020-07-28 ENCOUNTER — Other Ambulatory Visit: Payer: Self-pay

## 2020-07-28 DIAGNOSIS — M25511 Pain in right shoulder: Secondary | ICD-10-CM

## 2020-07-28 NOTE — Therapy (Signed)
[facility] [place], [place], [address] Phone: [phone]   Fax:  [phone]  Physical Therapy Treatment  Patient Details  Name: LARRELL RAPOZO MRN: [id number] Date of Birth: 02-09-1966 Referring Provider (PT): Sanjuana Kava MD   Encounter Date: 07/28/2020   PT End of Session - 07/28/20 0742    Visit Number 8    Number of Visits 12    Date for PT Re-Evaluation 08/08/20    PT Start Time 0734    PT Stop Time 0816    PT Time Calculation (min) 42 min    Activity Tolerance Patient tolerated treatment well    Behavior During Therapy [facility] for tasks assessed/performed           Past Medical History:  Diagnosis Date  . Arteriosclerotic cardiovascular disease (ASCVD)    Onset in 04/2012; CABG in 06/2012 w/ LIMA-LAD, L Rad-CFX, SVG-PL-PDA  . Hyperlipidemia    goal LDL < 70  . Insomnia   . Nephrolithiasis     Past Surgical History:  Procedure Laterality Date  . CORONARY ARTERY BYPASS GRAFT  06/24/2012   Procedure: CORONARY ARTERY BYPASS GRAFTING (CABG);  Surgeon: [name], MD;  Location: [facility];  Service: Open Heart Surgery;  Laterality: N/A;  Coronary Artery  Bypass X 4 utilizing endoscopic saphenous vein graft and  Left radial Artery Harvest   . CYSTOSCOPY W/ RETROGRADES Left 08/23/2015   Procedure: CYSTOSCOPY WITH RETROGRADE PYELOGRAM;  Surgeon: [name], MD;  Location[facility];  Service: Urology;  Laterality: Left;  . CYSTOSCOPY WITH URETEROSCOPY, STONE BASKETRY AND STENT PLACEMENT Left 08/23/2015   Procedure: CYSTOSCOPY WITH URETEROSCOPY, STONE BASKET EXTRACTION;  Surgeon: [name], MD;  Location: [facility];  Service: Urology;  Laterality: Left;  . HOLMIUM LASER APPLICATION Left 32/44/0102   Procedure: HOLMIUM LASER APPLICATION;  Surgeon: [name], MD;  Location: [facility];  Service: Urology;  Laterality: Left;  . LEFT HEART CATHETERIZATION WITH CORONARY ANGIOGRAM N/A 06/23/2012   Procedure:  LEFT HEART CATHETERIZATION WITH CORONARY ANGIOGRAM;  Surgeon: [name], MD;  Location: [facility];  Service: Cardiovascular;  Laterality: N/A;  . RHINOPLASTY    . TONSILLECTOMY  ~1971    There were no vitals filed for this visit.   Subjective Assessment - 07/28/20 0741    Subjective COVID 19 screening performed on patient upon arrival. Reports no new improvement regarding L shoulder.    Pertinent History ASCVD, CABG.    Patient Stated Goals Use left UE without pain.    Currently in Pain? Yes    Pain Score 2     Pain Location Shoulder    Pain Orientation Left    Pain Descriptors / Indicators Sore    Pain Type Acute pain    Pain Onset More than a month ago    Pain Frequency Constant              [facility] PT Assessment - 07/28/20 0001      Assessment   Medical Diagnosis Acute pain of left shoulder.    Referring Provider (PT) Sanjuana Kava MD    Next MD Visit 08/11/2020 MRI      Precautions   Precautions None      Restrictions   Weight Bearing Restrictions No                         [facility] Adult PT Treatment/Exercise - 07/28/20 0001  Shoulder Exercises: Sidelying   External Rotation Strengthening;Left;20 reps;Weights    External Rotation Weight (lbs) 2      Shoulder Exercises: Standing   Horizontal ABduction Strengthening;Both;20 reps;Theraband    Theraband Level (Shoulder Horizontal ABduction) Level 2 (Red)    External Rotation Strengthening;Left;20 reps;Theraband    Theraband Level (Shoulder External Rotation) Level 2 (Red)    Internal Rotation Strengthening;Left;20 reps;Theraband    Theraband Level (Shoulder Internal Rotation) Level 2 (Red)    Flexion Strengthening;Left;20 reps;Weights    Shoulder Flexion Weight (lbs) 3    Extension Strengthening;Left;20 reps;Theraband    Theraband Level (Shoulder Extension) Level 2 (Red)    Row Strengthening;Left;20 reps;Theraband    Theraband Level (Shoulder Row) Level 2 (Red)    Other Standing Exercises  B shoulder chop/lift blue XTS x15 reps      Shoulder Exercises: ROM/Strengthening   UBE (Upper Arm Bike) 90 RPM's x 8 minutes.    Wall Pushups 20 reps    Other ROM/Strengthening Exercises wall walks red theraband x2 RT      Modalities   Modalities Vasopneumatic      Vasopneumatic   Number Minutes Vasopneumatic  15 minutes    Vasopnuematic Location  Shoulder    Vasopneumatic Pressure Low    Vasopneumatic Temperature  34/edema                       PT Long Term Goals - 07/17/20 0810      PT LONG TERM GOAL #1   Title Ind with HEP.    Time 6    Period Weeks    Status On-going      PT LONG TERM GOAL #2   Title Perform ADL's with pain not > 2/10.    Time 6    Period Weeks    Status On-going                 Plan - 07/28/20 0817    Clinical Impression Statement Patient presented in clinic with similar L shoulder pain. Patient progressed through resisted shoulder strengthening with only reports of discomofrt with ER and rows. Weakness noted with SL ER as well. Normal vasopnuematic response noted following removal of the modality.    Personal Factors and Comorbidities Comorbidity 1    Comorbidities Bypass 2013    Examination-Activity Limitations Other    Examination-Participation Restrictions Other    Stability/Clinical Decision Making Stable/Uncomplicated    Rehab Potential Excellent    PT Frequency 2x / week    PT Duration 6 weeks    PT Treatment/Interventions ADLs/Self Care Home Management;Cryotherapy;Electrical Stimulation;Ultrasound;Moist Heat;Iontophoresis 4mg /ml Dexamethasone;Therapeutic exercise;Therapeutic activities;Manual techniques;Patient/family education;Vasopneumatic Device    PT Next Visit Plan Continue per POC as symptoms dictate.    Consulted and Agree with Plan of Care Patient           Patient will benefit from skilled therapeutic intervention in order to improve the following deficits and impairments:  Pain, Decreased activity  tolerance  Visit Diagnosis: Acute pain of right shoulder     Problem List Patient Active Problem List   Diagnosis Date Noted  . Tremor of right hand 05/02/2020  . Uncontrolled REM sleep behavior disorder 05/02/2020  . Hemiparesis of right dominant side ([place]) 05/02/2020  . Chest pain 06/22/2014  . Essential hypertension 06/22/2014  . Belching 06/22/2014  . Local skin infection 03/16/2013  . Arteriosclerotic cardiovascular disease (ASCVD) 06/26/2012  . Hyperlipidemia     Standley Brooking, PTA 07/28/2020, 9:28 AM  [facility] [place], [place], [address] Phone: [phone]   Fax:  [phone]  Name: ARTHER HEISLER MRN: [id number] Date of Birth: Jul 27, 1966

## 2020-08-02 ENCOUNTER — Other Ambulatory Visit: Payer: Self-pay | Admitting: Physician Assistant

## 2020-08-02 NOTE — Telephone Encounter (Signed)
This is a [place] pt °

## 2020-08-04 ENCOUNTER — Other Ambulatory Visit: Payer: Self-pay

## 2020-08-04 ENCOUNTER — Ambulatory Visit: Payer: Self-pay | Attending: Orthopaedic Surgery | Admitting: Physical Therapy

## 2020-08-04 ENCOUNTER — Encounter: Payer: Self-pay | Admitting: Physical Therapy

## 2020-08-04 DIAGNOSIS — M25511 Pain in right shoulder: Secondary | ICD-10-CM | POA: Insufficient documentation

## 2020-08-04 NOTE — Therapy (Signed)
[facility] [place], [place], [address] Phone: [phone]   Fax:  [phone]  Physical Therapy Treatment  Patient Details  Name: Edward Fisher MRN: [id number] Date of Birth: [birth year] Referring Provider (PT): Sanjuana Kava MD   Encounter Date: 08/04/2020   PT End of Session - 08/04/20 0745    Visit Number 9    Number of Visits 12    Date for PT Re-Evaluation 08/08/20    PT Start Time 0733    PT Stop Time 0815    PT Time Calculation (min) 42 min    Activity Tolerance Patient tolerated treatment well    Behavior During Therapy [facility] for tasks assessed/performed           Past Medical History:  Diagnosis Date   Arteriosclerotic cardiovascular disease (ASCVD)    Onset in 04/2012; CABG in 06/2012 w/ LIMA-LAD, L Rad-CFX, SVG-PL-PDA   Hyperlipidemia    goal LDL < 70   Insomnia    Nephrolithiasis     Past Surgical History:  Procedure Laterality Date   CORONARY ARTERY BYPASS GRAFT  06/24/2012   Procedure: CORONARY ARTERY BYPASS GRAFTING (CABG);  Surgeon: [name], MD;  Location: [place];  Service: Open Heart Surgery;  Laterality: N/A;  Coronary Artery  Bypass X 4 utilizing endoscopic saphenous vein graft and  Left radial Artery Harvest    CYSTOSCOPY W/ RETROGRADES Left 08/23/2015   Procedure: CYSTOSCOPY WITH RETROGRADE PYELOGRAM;  Surgeon: [name], MD;  [facility];  Service: Urology;  Laterality: Left;   CYSTOSCOPY WITH URETEROSCOPY, STONE BASKETRY AND STENT PLACEMENT Left 08/23/2015   Procedure: CYSTOSCOPY WITH URETEROSCOPY, STONE BASKET EXTRACTION;  Surgeon: [name], MD;  [facility];  Service: Urology;  Laterality: Left;   HOLMIUM LASER APPLICATION Left 75/08/2584   Procedure: HOLMIUM LASER APPLICATION;  Surgeon: [name], MD;  [facility];  Service: Urology;  Laterality: Left;   LEFT HEART CATHETERIZATION WITH CORONARY ANGIOGRAM N/A 06/23/2012   Procedure:  LEFT HEART CATHETERIZATION WITH CORONARY ANGIOGRAM;  Surgeon: [name], MD;  Location: [facility] CATH LAB;  Service: Cardiovascular;  Laterality: N/A;   RHINOPLASTY     TONSILLECTOMY  ~1971    There were no vitals filed for this visit.   Subjective Assessment - 08/04/20 0744    Subjective COVID 19 screening performed on patient upon arrival. Reports no new improvement regarding L shoulder.    Pertinent History ASCVD, CABG.    Patient Stated Goals Use left UE without pain.    Currently in Pain? Yes    Pain Score 1     Pain Location Shoulder    Pain Orientation Left    Pain Descriptors / Indicators Discomfort    Pain Type Acute pain    Pain Onset More than a month ago    Pain Frequency Intermittent              [facility] PT Assessment - 08/04/20 0001      Assessment   Medical Diagnosis Acute pain of left shoulder.    Referring Provider (PT) Sanjuana Kava MD    Next MD Visit 08/11/2020 MRI      Precautions   Precautions None      Restrictions   Weight Bearing Restrictions No                         [facility] Adult PT Treatment/Exercise - 08/04/20 0001  Shoulder Exercises: Prone   Retraction Strengthening;Left;20 reps;Weights    Retraction Weight (lbs) 4    Extension Strengthening;Left;20 reps;Weights    Extension Weight (lbs) 4      Shoulder Exercises: Sidelying   External Rotation Strengthening;Left;20 reps;Weights    External Rotation Weight (lbs) 2      Shoulder Exercises: Standing   Protraction Strengthening;Left;20 reps;Theraband    Theraband Level (Shoulder Protraction) Level 2 (Red)    Horizontal ABduction Strengthening;Both;20 reps;Theraband    Theraband Level (Shoulder Horizontal ABduction) Level 2 (Red)    External Rotation Strengthening;Left;20 reps;Theraband    Theraband Level (Shoulder External Rotation) Level 2 (Red)    Internal Rotation Strengthening;Left;20 reps;Theraband    Theraband Level (Shoulder Internal Rotation) Level 2 (Red)     Extension Strengthening;Left;20 reps;Theraband    Extension Limitations Blue XTS    Row Strengthening;Left;20 reps;Theraband    Row Limitations Blue XTS    Diagonals Strengthening;Both;20 reps;Limitations    Diagonals Limitations Blue XTS; B chop/lift      Shoulder Exercises: ROM/Strengthening   UBE (Upper Arm Bike) 60 RPM x10 min (3m forward, 28m backward)      Modalities   Modalities Vasopneumatic      Vasopneumatic   Number Minutes Vasopneumatic  10 minutes    Vasopnuematic Location  Shoulder    Vasopneumatic Pressure Low    Vasopneumatic Temperature  34/edema and pain                  PT Education - 08/04/20 0812    Education Details HEP- resisted ER, IR, extension    Person(s) Educated Patient    Methods Explanation;Demonstration;Handout    Comprehension Verbalized understanding               PT Long Term Goals - 07/17/20 0810      PT LONG TERM GOAL #1   Title Ind with HEP.    Time 6    Period Weeks    Status On-going      PT LONG TERM GOAL #2   Title Perform ADL's with pain not > 2/10.    Time 6    Period Weeks    Status On-going                 Plan - 08/04/20 1610    Clinical Impression Statement Patient continues to present in PT with no new improvement regarding L shoulder pain. 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 Patient guided through more strengthening of L shoulder with no reports of any increased pain. Normal vasopnuematic response noted following removal of the modality. Goals remain on-going at this time as patient was provided new HEP today and pain with ADLs remain higher than 2/10. Patient verbalized understanding of theraband placement and education regarding technique and parameters.    Personal Factors and Comorbidities Comorbidity 1    Comorbidities Bypass 2013    Examination-Activity Limitations Other     Examination-Participation Restrictions Other    Stability/Clinical Decision Making Stable/Uncomplicated    Rehab Potential Excellent    PT Frequency 2x / week    PT Duration 6 weeks    PT Treatment/Interventions ADLs/Self Care Home Management;Cryotherapy;Electrical Stimulation;Ultrasound;Moist Heat;Iontophoresis 4mg /ml Dexamethasone;Therapeutic exercise;Therapeutic activities;Manual techniques;Patient/family education;Vasopneumatic Device    PT Next Visit Plan Continue per POC as symptoms dictate.    Consulted and Agree with Plan of Care Patient           Patient  will benefit from skilled therapeutic intervention in order to improve the following deficits and impairments:  Pain, Decreased activity tolerance  Visit Diagnosis: Acute pain of right shoulder     Problem List Patient Active Problem List   Diagnosis Date Noted   Tremor of right hand 05/02/2020   Uncontrolled REM sleep behavior disorder 05/02/2020   Hemiparesis of right dominant side ([place]) 05/02/2020   Chest pain 06/22/2014   Essential hypertension 06/22/2014   Belching 06/22/2014   Local skin infection 03/16/2013   Arteriosclerotic cardiovascular disease (ASCVD) 06/26/2012   Hyperlipidemia     Standley Brooking, PTA 08/04/2020, 8:18 AM  [facility] [address] [place], [place], [address] Phone: [phone]   Fax:  [phone]  Name: ANIL HAVARD MRN: [id number] Date of Birth: 1966/10/22

## 2020-08-10 ENCOUNTER — Telehealth: Payer: Self-pay | Admitting: Orthopaedic Surgery

## 2020-08-10 MED ORDER — DIAZEPAM 10 MG PO TABS
10.0000 mg | ORAL_TABLET | Freq: Once | ORAL | 0 refills | Status: AC
Start: 2020-08-10 — End: 2020-08-10

## 2020-08-10 NOTE — Telephone Encounter (Signed)
Patient was previously scheduled for MRI but could not go through with it due to being somewhat claustrophobic.  He has been rescheduled for an open unit but he is still a little fearful that he might not be able to go through with it.  He said he was told by a friend that sometimes the doctor will order something for him to take to help him relax.    He wants to know if you will give him something for this purpose?  Patient states he uses [facility] in [place]

## 2020-08-11 ENCOUNTER — Encounter: Payer: Self-pay | Admitting: Physical Therapy

## 2020-08-11 ENCOUNTER — Ambulatory Visit
Admission: RE | Admit: 2020-08-11 | Discharge: 2020-08-11 | Disposition: A | Discharge status: Home / Self Care | Payer: Self-pay | Source: Ambulatory Visit | Attending: Orthopaedic Surgery | Admitting: Orthopaedic Surgery

## 2020-08-11 DIAGNOSIS — M25512 Pain in left shoulder: Secondary | ICD-10-CM

## 2020-08-11 IMAGING — MR MR SHOULDER*L* W/O CM
5 series · 35 of 40 positions shown · non-contrast
Comparison: Radiograph [DATE].

CLINICAL DATA: Left shoulder pain for 2-3 months, worsening with
raising arm above the head. No acute injury. Patient fell in [REDACTED].
No previous relevant surgery.

EXAM:
MRI OF THE LEFT SHOULDER WITHOUT CONTRAST
TECHNIQUE: Multiplanar, multisequence MR imaging of the shoulder was performed.
No intravenous contrast was administered.

[Series 3: PD fat-sat · axial · 4.0mm · 0.59mm/px · z∈[-45,+75]mm · 8 of 26 slices shown]
[im 1/26]
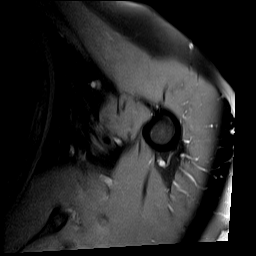
[im 3/26]
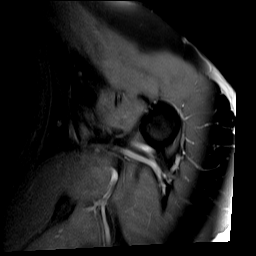
[im 9/26]
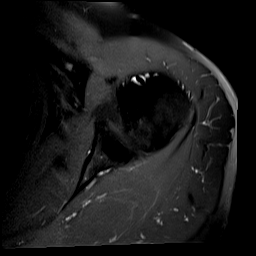
[im 12/26]
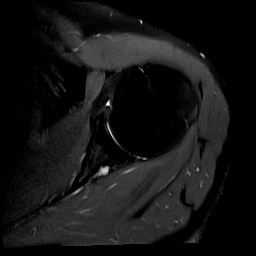
[im 14/26]
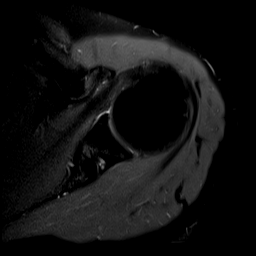
[im 17/26]
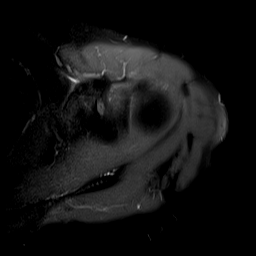
[im 23/26]
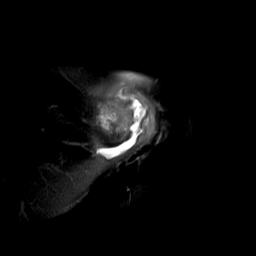
[im 26/26]
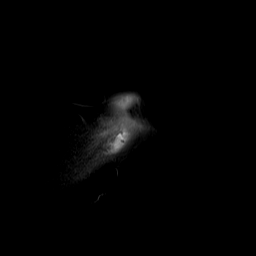

[Series 4: T1 · oblique · 4.0mm · 0.29mm/px · 5 of 25 slices shown]
[im 1/25]
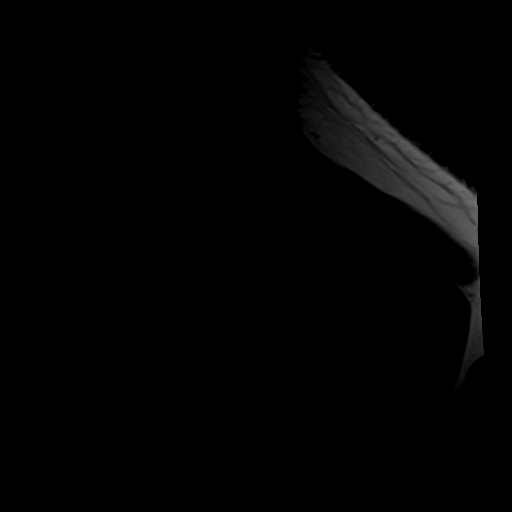
[im 4/25]
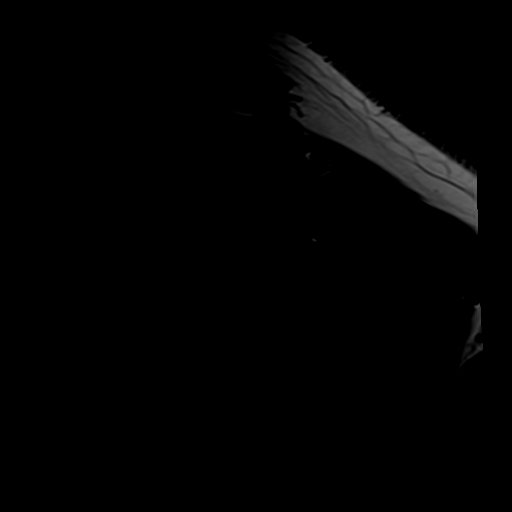
[im 7/25]
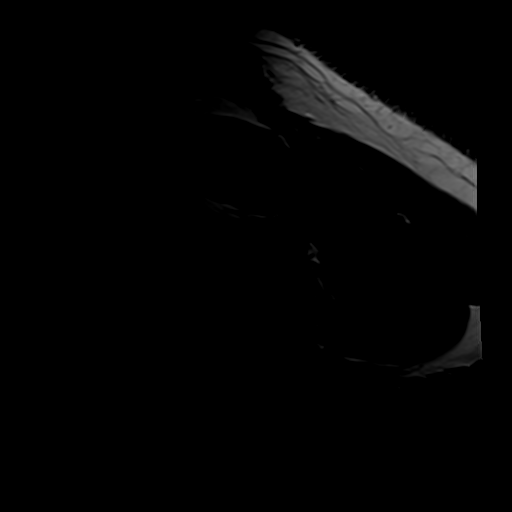
[im 11/25]
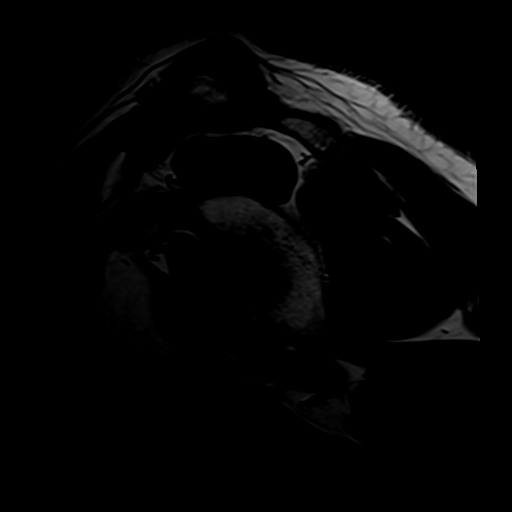
[im 14/25]
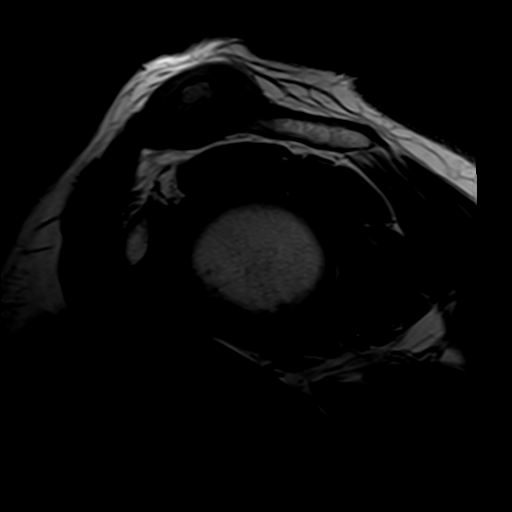

[Series 5: T2 fat-sat · oblique · 4.0mm · 0.59mm/px · 8 of 24 slices shown (1 of 2)]
[im 1/24]
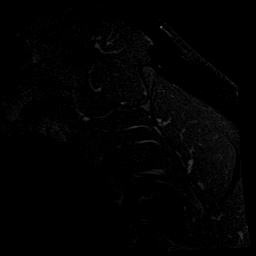
[im 4/24]
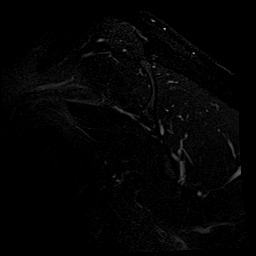
[im 7/24]
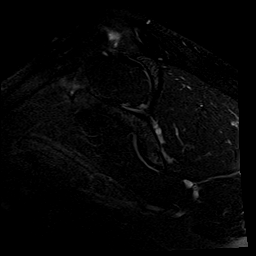
[im 10/24]
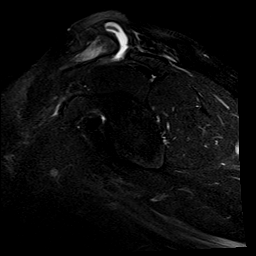
[im 14/24]
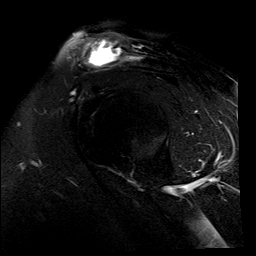
[im 17/24]
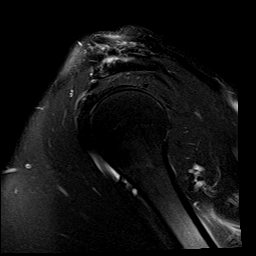
[im 20/24]
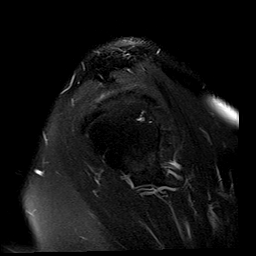
[im 24/24]
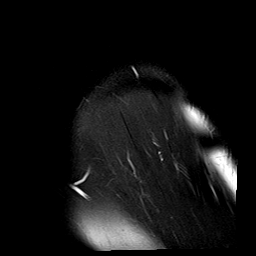

[Series 6: T2 fat-sat · oblique · 4.0mm · 0.59mm/px · 7 of 22 slices shown (2 of 2)]
[im 1/22]
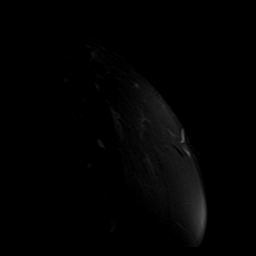
[im 4/22]
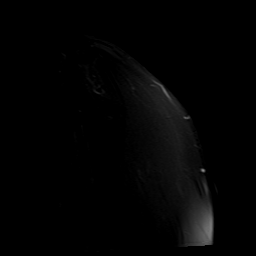
[im 8/22]
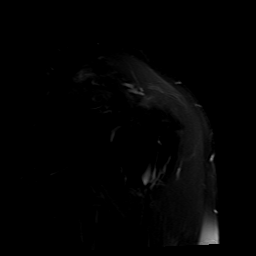
[im 11/22]
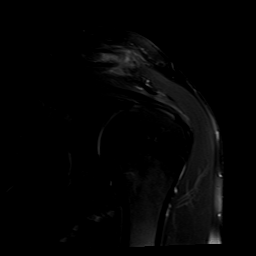
[im 15/22]
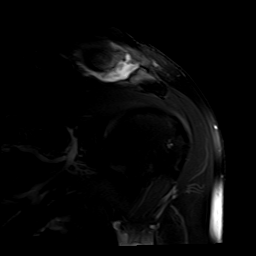
[im 18/22]
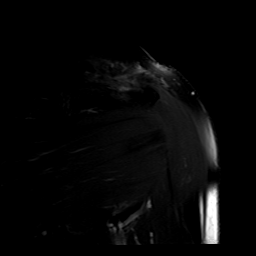
[im 22/22]
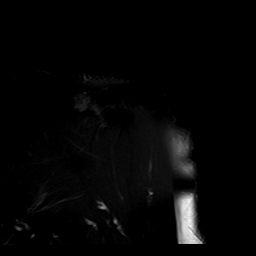

[Series 7: PD · oblique · 4.0mm · 0.27mm/px · 7 of 22 slices shown]
[im 1/22]
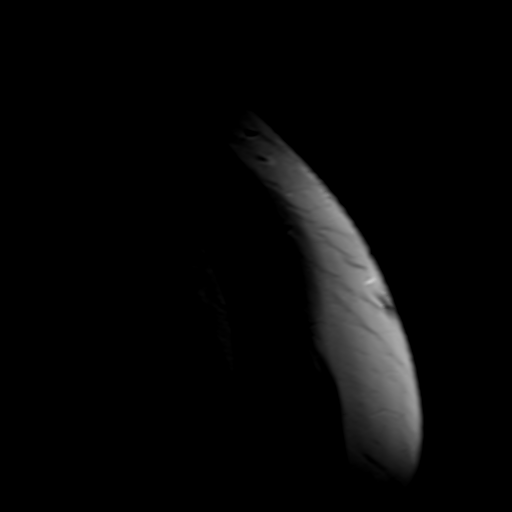
[im 4/22]
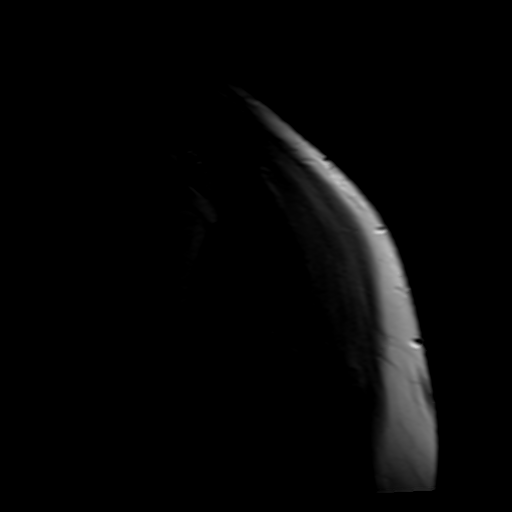
[im 8/22]
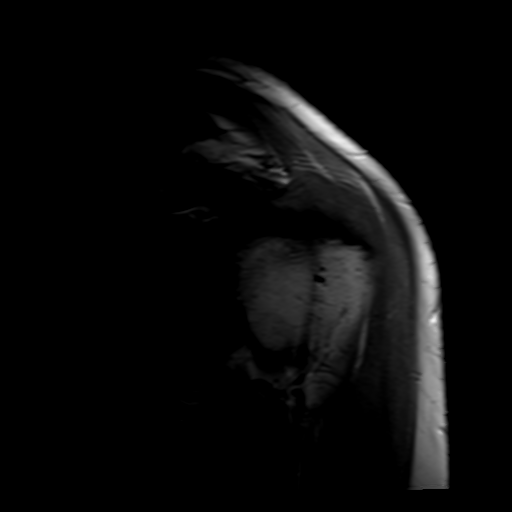
[im 11/22]
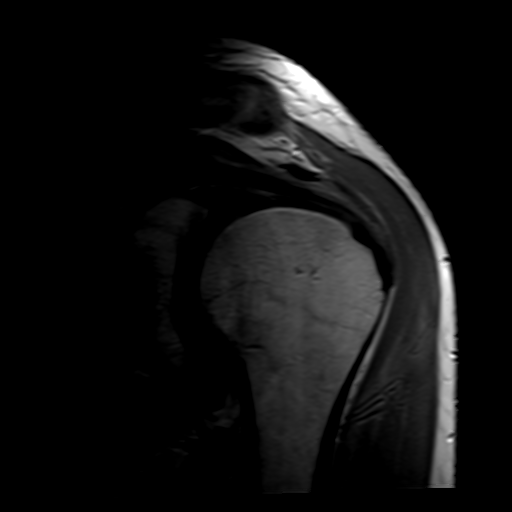
[im 15/22]
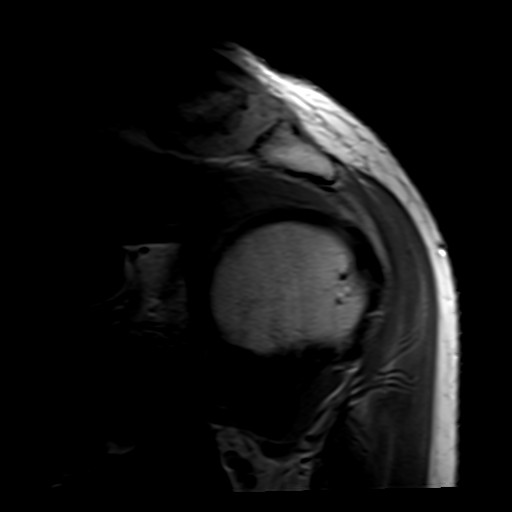
[im 18/22]
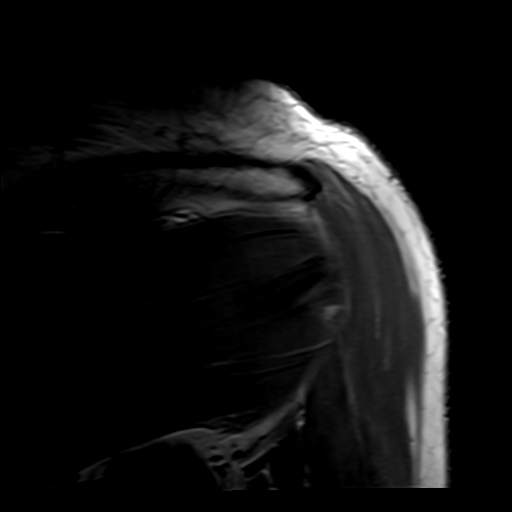
[im 22/22]
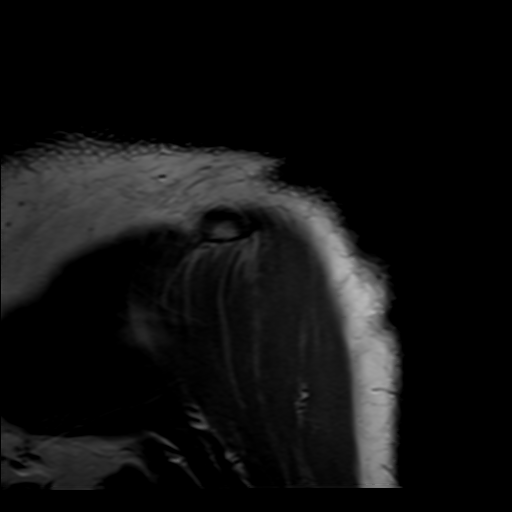

[35 of 40 positions shown; findings below may reference images not displayed]

FINDINGS: Rotator cuff: Minimal supraspinatus tendinosis without tear. The
infraspinatus, subscapularis and teres minor tendons appear normal.

Muscles:  No focal muscular atrophy or edema.

Biceps long head:  Intact and normally positioned.

Acromioclavicular Joint: The acromion is type 2. The
acromioclavicular joint is widened with up lifting of the distal
chromium, similar to prior radiographs. There is a large
acromioclavicular joint effusion. There is marrow edema in the
distal clavicle and adjacent acromion with possible mild distal
clavicular osteolysis. The coracoclavicular ligaments are poorly
visualized and appear torn. No significant fluid is present in the
subacromial - subdeltoid bursa.

Glenohumeral Joint: No significant shoulder joint effusion or
glenohumeral arthropathy.

Labrum:  No evidence of labral tear or paralabral cyst.

Bones: As above, findings compatible with a type 3 AC joint injury
and possible distal clavicular osteolysis. No acute osseous
findings.

Other: No significant soft tissue findings.
IMPRESSION: 1. Findings compatible with a type 3 AC joint injury and possible
mild distal clavicular osteolysis.
2. Minimal supraspinatus tendinosis without tear.
3. The labrum and biceps tendon appear intact.

## 2020-08-15 ENCOUNTER — Encounter: Payer: Self-pay | Admitting: Orthopaedic Surgery

## 2020-08-15 ENCOUNTER — Other Ambulatory Visit: Payer: Self-pay

## 2020-08-15 ENCOUNTER — Ambulatory Visit (INDEPENDENT_AMBULATORY_CARE_PROVIDER_SITE_OTHER): Payer: Self-pay | Admitting: Orthopaedic Surgery

## 2020-08-15 DIAGNOSIS — M25512 Pain in left shoulder: Secondary | ICD-10-CM

## 2020-08-15 NOTE — Progress Notes (Signed)
Patient YK:DXIP Edward Fisher, male DOB:[birth year], 54 y.o. JAS:[id number]  Chief Complaint  Patient presents with  . Shoulder Pain    Left shoulder pain    HPI  Edward Fisher is a 54 y.o. male who has left shoulder pain.  He had MRI which showed: IMPRESSION: 1. Findings compatible with a type 3 AC joint injury and possible mild distal clavicular osteolysis. 2. Minimal supraspinatus tendinosis without tear. 3. The labrum and biceps tendon appear intact.  I have explained the findings to him.  He might need something done surgically to the shoulder eventually, but I want him to wait and see how he does.  I have recommended cream to the shoulder of Aspercreme, biofreeze or Voltaren Gel.   I have independently reviewed the MRI.      There is no height or weight on file to calculate BMI.  ROS  Review of Systems  All other systems reviewed and are negative.  The following is a summary of the past history medically, past history surgically, known current medicines, social history and family history.  This information is gathered electronically by the computer from prior information and documentation.  I review this each visit and have found including this information at this point in the chart is beneficial and informative.    Past Medical History:  Diagnosis Date  . Arteriosclerotic cardiovascular disease (ASCVD)    Onset in 04/2012; CABG in 06/2012 w/ LIMA-LAD, L Rad-CFX, SVG-PL-PDA  . Hyperlipidemia    goal LDL < 70  . Insomnia   . Nephrolithiasis     Past Surgical History:  Procedure Laterality Date  . CORONARY ARTERY BYPASS GRAFT  06/24/2012   Procedure: CORONARY ARTERY BYPASS GRAFTING (CABG);  Surgeon: [name], MD;  Location: [place];  Service: Open Heart Surgery;  Laterality: N/A;  Coronary Artery  Bypass X 4 utilizing endoscopic saphenous vein graft and  Left radial Artery Harvest   . CYSTOSCOPY W/ RETROGRADES Left 08/23/2015   Procedure: CYSTOSCOPY WITH RETROGRADE  PYELOGRAM;  Surgeon: [name], MD;  [facility];  Service: Urology;  Laterality: Left;  . CYSTOSCOPY WITH URETEROSCOPY, STONE BASKETRY AND STENT PLACEMENT Left 08/23/2015   Procedure: CYSTOSCOPY WITH URETEROSCOPY, STONE BASKET EXTRACTION;  Surgeon: [name], MD;  Location: [facility];  Service: Urology;  Laterality: Left;  . HOLMIUM LASER APPLICATION Left 41/93/7902   Procedure: HOLMIUM LASER APPLICATION;  Surgeon: [name], MD;  Location: [facility];  Service: Urology;  Laterality: Left;  . LEFT HEART CATHETERIZATION WITH CORONARY ANGIOGRAM N/A 06/23/2012   Procedure: LEFT HEART CATHETERIZATION WITH CORONARY ANGIOGRAM;  Surgeon: [name], MD;  Location: [facility] CATH LAB;  Service: Cardiovascular;  Laterality: N/A;  . RHINOPLASTY    . TONSILLECTOMY  ~1971    Family History  Problem Relation Age of Onset  . Hypertension Mother   . Ulcers Father        Peptic ulcer disease  . Coronary artery disease Father        Stents  . Coronary artery disease Brother        CABG    Social History Social History   Tobacco Use  . Smoking status: Never Smoker  . Smokeless tobacco: Former Systems developer    Types: Chew  . Tobacco comment: 06/22/2012 "quit chewing 10-15 years ago"  Vaping Use  . Vaping Use: Never used  Substance Use Topics  . Alcohol use: No    Alcohol/week: 0.0 standard drinks  . Drug use: No  Allergies  Allergen Reactions  . Pollen Extract Cough    Current Outpatient Medications  Medication Sig Dispense Refill  . aspirin 81 MG chewable tablet Chew 81 mg by mouth every evening. TO START DOSAGE IN 6 MONTHS APPROXIMATELY July 2014    . atorvastatin (LIPITOR) 20 MG tablet Take 1 tablet by mouth once daily 30 tablet 4  . citalopram (CELEXA) 20 MG tablet Take 1 tablet (20 mg total) by mouth daily. 30 tablet 1  . metoprolol succinate (TOPROL-XL) 25 MG 24 hr tablet Take 1 tablet by mouth once daily 90 tablet 2  . naproxen (NAPROSYN) 500 MG tablet Take 1 tablet  (500 mg total) by mouth 2 (two) times daily with a meal. 60 tablet 5  . nitroGLYCERIN (NITROSTAT) 0.4 MG SL tablet Place 1 tablet (0.4 mg total) under the tongue every 5 (five) minutes as needed for chest pain. 25 tablet 3  . omega-3 acid ethyl esters (LOVAZA) 1 G capsule Take 1 g by mouth every evening.     No current facility-administered medications for this visit.     Physical Exam  There were no vitals taken for this visit.  Constitutional: overall normal hygiene, normal nutrition, well developed, normal grooming, normal body habitus. Assistive device:none  Musculoskeletal: gait and station Limp none, muscle tone and strength are normal, no tremors or atrophy is present.  .  Neurological: coordination overall normal.  Deep tendon reflex/nerve stretch intact.  Sensation normal.  Cranial nerves II-XII intact.   Skin:   Normal overall no scars, lesions, ulcers or rashes. No psoriasis.  Psychiatric: Alert and oriented x 3.  Recent memory intact, remote memory unclear.  Normal mood and affect. Well groomed.  Good eye contact.  Cardiovascular: overall no swelling, no varicosities, no edema bilaterally, normal temperatures of the legs and arms, no clubbing, cyanosis and good capillary refill.  Lymphatic: palpation is normal.  He has full ROM of ht eleft shoulder, pain over the [facility] joint.  All other systems reviewed and are negative   The patient has been educated about the nature of the problem(s) and counseled on treatment options.  The patient appeared to understand what I have discussed and is in agreement with it.  Encounter Diagnosis  Name Primary?  . Acute pain of left shoulder Yes    PLAN Call if any problems.  Precautions discussed.  Continue current medications.   Return to clinic 1 month   Electronically Signed Sanjuana Kava, MD 10/12/20213:03 PM

## 2020-08-15 NOTE — Progress Notes (Signed)
Use Aspercreme, Biofreeze or Voltaren gel over the counter 2-3 times daily make sure you rub it in well each time you use it.  Blue emu is another option.

## 2020-08-21 ENCOUNTER — Ambulatory Visit: Payer: Self-pay | Admitting: Physician Assistant

## 2020-08-21 ENCOUNTER — Ambulatory Visit: Payer: Self-pay | Admitting: Physical Therapy

## 2020-08-21 ENCOUNTER — Encounter: Payer: Self-pay | Admitting: Physician Assistant

## 2020-08-21 ENCOUNTER — Other Ambulatory Visit: Payer: Self-pay

## 2020-08-21 ENCOUNTER — Encounter: Payer: Self-pay | Admitting: Physical Therapy

## 2020-08-21 VITALS — Temp 98.5°F

## 2020-08-21 DIAGNOSIS — F4321 Adjustment disorder with depressed mood: Secondary | ICD-10-CM

## 2020-08-21 DIAGNOSIS — M25511 Pain in right shoulder: Secondary | ICD-10-CM

## 2020-08-21 MED ORDER — FLUOXETINE HCL 20 MG PO CAPS: 20.0000 mg | ORAL_CAPSULE | Freq: Every day | ORAL | 0 refills | Status: DC

## 2020-08-21 NOTE — Therapy (Signed)
[facility] [place], [place], [address] Phone: [phone]   Fax:  [phone]  Physical Therapy Treatment  Progress Note Reporting Period 06/27/2020 to 08/21/2020  See note below for Objective Data and Assessment of Progress/Goals.  Patient has made slight improvements but still with pain with overhead activities for work.   Patient Details  Name: Edward Fisher MRN: [id number] Date of Birth: 1966/05/12 Referring Provider (PT): Sanjuana Kava MD   Encounter Date: 08/21/2020   PT End of Session - 08/21/20 0822    Visit Number 10    Number of Visits 12    Date for PT Re-Evaluation 08/08/20    PT Start Time 0733    PT Stop Time 0815    PT Time Calculation (min) 42 min    Activity Tolerance Patient tolerated treatment well    Behavior During Therapy [facility] for tasks assessed/performed           Past Medical History:  Diagnosis Date  . Arteriosclerotic cardiovascular disease (ASCVD)    Onset in 04/2012; CABG in 06/2012 w/ LIMA-LAD, L Rad-CFX, SVG-PL-PDA  . Hyperlipidemia    goal LDL < 70  . Insomnia   . Nephrolithiasis     Past Surgical History:  Procedure Laterality Date  . CORONARY ARTERY BYPASS GRAFT  06/24/2012   Procedure: CORONARY ARTERY BYPASS GRAFTING (CABG);  Surgeon: [name], MD;  Location: [place];  Service: Open Heart Surgery;  Laterality: N/A;  Coronary Artery  Bypass X 4 utilizing endoscopic saphenous vein graft and  Left radial Artery Harvest   . CYSTOSCOPY W/ RETROGRADES Left 08/23/2015   Procedure: CYSTOSCOPY WITH RETROGRADE PYELOGRAM;  Surgeon: [name], MD;  [facility];  Service: Urology;  Laterality: Left;  . CYSTOSCOPY WITH URETEROSCOPY, STONE BASKETRY AND STENT PLACEMENT Left 08/23/2015   Procedure: CYSTOSCOPY WITH URETEROSCOPY, STONE BASKET EXTRACTION;  Surgeon: [name], MD;  [facility];  Service: Urology;  Laterality: Left;  . HOLMIUM LASER APPLICATION Left  88/28/0034   Procedure: HOLMIUM LASER APPLICATION;  Surgeon: [name], MD;  [facility];  Service: Urology;  Laterality: Left;  . LEFT HEART CATHETERIZATION WITH CORONARY ANGIOGRAM N/A 06/23/2012   Procedure: LEFT HEART CATHETERIZATION WITH CORONARY ANGIOGRAM;  Surgeon: [name], MD;  Location: [facility] CATH LAB;  Service: Cardiovascular;  Laterality: N/A;  . RHINOPLASTY    . TONSILLECTOMY  ~1971    There were no vitals filed for this visit.   Subjective Assessment - 08/21/20 0745    Subjective COVID 19 screening performed on patient upon arrival. Patient reports doing alright today. States still with pain with movement. Got MRI results back.    Pertinent History ASCVD, CABG.    Diagnostic tests MRI: see imaging    Patient Stated Goals Use left UE without pain.    Currently in Pain? Yes    Pain Score 2     Pain Location Shoulder    Pain Orientation Left    Pain Descriptors / Indicators Discomfort    Pain Type Acute pain    Pain Onset More than a month ago    Pain Frequency Intermittent              [facility] PT Assessment - 08/21/20 0001      Assessment   Medical Diagnosis Acute pain of left shoulder.    Referring Provider (PT) Sanjuana Kava MD    Next MD Visit "another month"      Precautions  Precautions None      Restrictions   Weight Bearing Restrictions No                         [facility] Adult PT Treatment/Exercise - 08/21/20 0001      Shoulder Exercises: Standing   Protraction Strengthening;Left;20 reps;Theraband    Theraband Level (Shoulder Protraction) Level 2 (Red)    External Rotation Strengthening;Left;20 reps;Theraband    Theraband Level (Shoulder External Rotation) Level 2 (Red)    Internal Rotation Strengthening;Left;20 reps;Theraband    Theraband Level (Shoulder Internal Rotation) Level 2 (Red)    Extension Strengthening;Left;20 reps;Theraband    Theraband Level (Shoulder Extension) Level 2 (Red)    Row Strengthening;Left;20  reps;Theraband    Theraband Level (Shoulder Row) Level 2 (Red)    Diagonals Strengthening;Both;20 reps;Limitations    Diagonals Limitations Blue XTS; B chop/lift      Shoulder Exercises: ROM/Strengthening   UBE (Upper Arm Bike) 60 RPM x10 min (61m forward, 63m backward)    Wall Wash overhead wall wash circles CW and CCW    Wall Pushups 20 reps      Modalities   Modalities Vasopneumatic      Vasopneumatic   Number Minutes Vasopneumatic  10 minutes    Vasopnuematic Location  Shoulder    Vasopneumatic Pressure Low    Vasopneumatic Temperature  34/edema and pain                       PT Long Term Goals - 08/21/20 0804      PT LONG TERM GOAL #1   Title Ind with HEP.    Time 6    Period Weeks    Status On-going      PT LONG TERM GOAL #2   Title Perform ADL's with pain not > 2/10.    Time 6    Period Weeks    Status On-going   average about 4-5/10                Plan - 08/21/20 0810    Clinical Impression Statement Patient was able to complete treatment fairly well but with fatigue and slight increase of pain. Patient reported more discomfort more with clockwise overhead circles than counterclockwise. Patient's goals are ongoing at this time, still with pain with ADLs and work activites. No adverse affects upon removal of modalities.    Personal Factors and Comorbidities Comorbidity 1    Comorbidities Bypass 2013    Examination-Activity Limitations Other    Examination-Participation Restrictions Other    Stability/Clinical Decision Making Stable/Uncomplicated    Clinical Decision Making Low    Rehab Potential Excellent    PT Frequency 2x / week    PT Duration 6 weeks    PT Treatment/Interventions ADLs/Self Care Home Management;Cryotherapy;Electrical Stimulation;Ultrasound;Moist Heat;Iontophoresis 4mg /ml Dexamethasone;Therapeutic exercise;Therapeutic activities;Manual techniques;Patient/family education;Vasopneumatic Device    PT Next Visit Plan Continue per  POC as symptoms dictate.    Consulted and Agree with Plan of Care Patient           Patient will benefit from skilled therapeutic intervention in order to improve the following deficits and impairments:  Pain, Decreased activity tolerance  Visit Diagnosis: Acute pain of right shoulder     Problem List Patient Active Problem List   Diagnosis Date Noted  . Tremor of right hand 05/02/2020  . Uncontrolled REM sleep behavior disorder 05/02/2020  . Hemiparesis of right dominant side ([place]) 05/02/2020  . Chest pain  06/22/2014  . Essential hypertension 06/22/2014  . Belching 06/22/2014  . Local skin infection 03/16/2013  . Arteriosclerotic cardiovascular disease (ASCVD) 06/26/2012  . Hyperlipidemia     Gabriela Eves, PT, DPT 08/21/2020, 8:23 AM  [facility] [address] [place], [place], [address] Phone: [phone]   Fax:  [phone]  Name: Edward Fisher MRN: [id number] Date of Birth: 09/22/1966

## 2020-08-21 NOTE — Progress Notes (Signed)
There were no vitals taken for this visit.   Subjective:    Patient ID: Edward Fisher, male    DOB: 12/16/1965, 54 y.o.   MRN: [id number]  HPI: Edward Fisher is a 54 y.o. male presenting on 08/21/2020 for No chief complaint on file.   HPI  Pt had a negative covid 19 screening questionnaire.       Pt was started on Citalopram last month to help with his depression and grief over the recent loss of his wife.  He says he could tell it helped his mood but he stopped taking it because it made him nauseous.  Pt is continuing to go to work, he is maintaining close contact with family members, he continues to go to church.  He says he is still having episodes of crying.   He is not drinking.    No SI, HI.      Relevant past medical, surgical, family and social history reviewed and updated as indicated. Interim medical history since our last visit reviewed. Allergies and medications reviewed and updated.   Current Outpatient Medications:  .  aspirin 81 MG chewable tablet, Chew 81 mg by mouth every evening. TO START DOSAGE IN 6 MONTHS APPROXIMATELY July 2014, Disp: , Rfl:  .  atorvastatin (LIPITOR) 20 MG tablet, Take 1 tablet by mouth once daily, Disp: 30 tablet, Rfl: 4 .  citalopram (CELEXA) 20 MG tablet, Take 1 tablet (20 mg total) by mouth daily. (Patient not taking: Reported on 08/21/2020), Disp: 30 tablet, Rfl: 1 .  metoprolol succinate (TOPROL-XL) 25 MG 24 hr tablet, Take 1 tablet by mouth once daily, Disp: 90 tablet, Rfl: 2 .  naproxen (NAPROSYN) 500 MG tablet, Take 1 tablet (500 mg total) by mouth 2 (two) times daily with a meal., Disp: 60 tablet, Rfl: 5 .  nitroGLYCERIN (NITROSTAT) 0.4 MG SL tablet, Place 1 tablet (0.4 mg total) under the tongue every 5 (five) minutes as needed for chest pain., Disp: 25 tablet, Rfl: 3 .  omega-3 acid ethyl esters (LOVAZA) 1 G capsule, Take 1 g by mouth every evening., Disp: , Rfl:      Review of Systems  Per HPI unless specifically  indicated above     Objective:    There were no vitals taken for this visit.  Wt Readings from Last 3 Encounters:  07/24/20 221 lb 6.4 oz (100.4 kg)  07/06/20 240 lb (108.9 kg)  06/26/20 225 lb (102.1 kg)    Vitals:   08/21/20 1508  Temp: 98.5 F (36.9 C)      Physical Exam Constitutional:      General: He is not in acute distress.    Appearance: He is not ill-appearing.  HENT:     Head: Normocephalic and atraumatic.  Pulmonary:     Effort: Pulmonary effort is normal. No respiratory distress.  Neurological:     Mental Status: He is alert and oriented to person, place, and time.  Psychiatric:        Attention and Perception: Attention normal.        Mood and Affect: Affect is not inappropriate.        Speech: Speech normal.        Behavior: Behavior normal. Behavior is cooperative.           Assessment & Plan:    Encounter Diagnosis  Name Primary?  . Grief Yes      -Will try prozac.  Encouraged pt to contact [facility] for support  group.  He is encouraged to maintain close contacts with friends and family. -he is to follow up in 3 week to recheck.  He is to contact office sooner prn

## 2020-09-01 ENCOUNTER — Ambulatory Visit: Payer: Self-pay | Admitting: Physical Therapy

## 2020-09-01 ENCOUNTER — Other Ambulatory Visit: Payer: Self-pay

## 2020-09-01 ENCOUNTER — Encounter: Payer: Self-pay | Admitting: Physical Therapy

## 2020-09-01 DIAGNOSIS — M25511 Pain in right shoulder: Secondary | ICD-10-CM

## 2020-09-01 NOTE — Therapy (Signed)
[facility] [place], [place], [address] Phone: [phone]   Fax:  [phone]  Physical Therapy Treatment  Patient Details  Name: Edward Fisher MRN: [id number] Date of Birth: [birth year] Referring Provider (PT): Sanjuana Kava MD   Encounter Date: 09/01/2020   PT End of Session - 09/01/20 0734    Visit Number 11    Number of Visits 12    Date for PT Re-Evaluation 08/08/20    PT Start Time 0732    PT Stop Time 0814    PT Time Calculation (min) 42 min    Activity Tolerance Patient tolerated treatment well    Behavior During Therapy [facility] for tasks assessed/performed           Past Medical History:  Diagnosis Date  . Arteriosclerotic cardiovascular disease (ASCVD)    Onset in 04/2012; CABG in 06/2012 w/ LIMA-LAD, L Rad-CFX, SVG-PL-PDA  . Hyperlipidemia    goal LDL < 70  . Insomnia   . Nephrolithiasis     Past Surgical History:  Procedure Laterality Date  . CORONARY ARTERY BYPASS GRAFT  06/24/2012   Procedure: CORONARY ARTERY BYPASS GRAFTING (CABG);  Surgeon: [name], MD;  Location: [place];  Service: Open Heart Surgery;  Laterality: N/A;  Coronary Artery  Bypass X 4 utilizing endoscopic saphenous vein graft and  Left radial Artery Harvest   . CYSTOSCOPY W/ RETROGRADES Left 08/23/2015   Procedure: CYSTOSCOPY WITH RETROGRADE PYELOGRAM;  Surgeon: [name], MD;  [facility];  Service: Urology;  Laterality: Left;  . CYSTOSCOPY WITH URETEROSCOPY, STONE BASKETRY AND STENT PLACEMENT Left 08/23/2015   Procedure: CYSTOSCOPY WITH URETEROSCOPY, STONE BASKET EXTRACTION;  Surgeon: [name], MD;  Location: [facility];  Service: Urology;  Laterality: Left;  . HOLMIUM LASER APPLICATION Left 30/16/0109   Procedure: HOLMIUM LASER APPLICATION;  Surgeon: [name], MD;  Location[facility];  Service: Urology;  Laterality: Left;  . LEFT HEART CATHETERIZATION WITH CORONARY ANGIOGRAM N/A 06/23/2012    Procedure: LEFT HEART CATHETERIZATION WITH CORONARY ANGIOGRAM;  Surgeon: [name], MD;  Location: [facility] C[facility];  Service: Cardiovascular;  Laterality: N/A;  . RHINOPLASTY    . TONSILLECTOMY  ~1971    There were no vitals filed for this visit.   Subjective Assessment - 09/01/20 0733    Subjective COVID 19 screening performed on patient upon arrival. Patient reports doing alright right now. Still hurts with activity.    Pertinent History ASCVD, CABG.    Diagnostic tests MRI: see imaging    Currently in Pain? Yes    Pain Score 3     Pain Location Shoulder    Pain Orientation Left    Pain Descriptors / Indicators Discomfort              [facility] PT Assessment - 09/01/20 0001      Assessment   Medical Diagnosis Acute pain of left shoulder.    Referring Provider (PT) Sanjuana Kava MD    Next MD Visit "another month"      Precautions   Precautions None      Restrictions   Weight Bearing Restrictions No                         [facility] Adult PT Treatment/Exercise - 09/01/20 0001      Shoulder Exercises: Seated   Other Seated Exercises lat pull down blue XTS x20      Shoulder Exercises: Prone  Retraction Strengthening;Left;20 reps;Weights    Retraction Weight (lbs) 4    Extension Strengthening;Left;20 reps;Weights    Extension Weight (lbs) 4      Shoulder Exercises: Standing   Protraction Strengthening;Left;20 reps;Theraband;10 reps   30   Theraband Level (Shoulder Protraction) Level 2 (Red)    Horizontal ABduction Strengthening;Both;20 reps;Theraband    Theraband Level (Shoulder Horizontal ABduction) Level 2 (Red)    External Rotation Strengthening;Left;20 reps;Theraband;10 reps   x30   Theraband Level (Shoulder External Rotation) Level 2 (Red)    Internal Rotation Strengthening;Left;20 reps;Theraband;10 reps   x30   Theraband Level (Shoulder Internal Rotation) Level 2 (Red)    Extension Strengthening;Left;20 reps;Theraband;10 reps   x30   Theraband  Level (Shoulder Extension) Level 2 (Red)    Row Strengthening;Left;20 reps;Theraband;10 reps   x30   Theraband Level (Shoulder Row) Level 2 (Red)    Diagonals Strengthening;Both;20 reps;Limitations    Diagonals Limitations Blue XTS; B chop/lift      Shoulder Exercises: ROM/Strengthening   UBE (Upper Arm Bike) 60 RPM x10 min (32m forward, 64m backward)    Wall Pushups 20 reps      Modalities   Modalities Vasopneumatic      Vasopneumatic   Number Minutes Vasopneumatic  10 minutes    Vasopnuematic Location  Shoulder    Vasopneumatic Pressure Low    Vasopneumatic Temperature  34/edema and pain                       PT Long Term Goals - 08/21/20 0804      PT LONG TERM GOAL #1   Title Ind with HEP.    Time 6    Period Weeks    Status On-going      PT LONG TERM GOAL #2   Title Perform ADL's with pain not > 2/10.    Time 6    Period Weeks    Status On-going   average about 4-5/10                Plan - 09/01/20 0747    Clinical Impression Statement Patient responded well to the progression of TEs with reports of fatigue. Patient demonstrated good form, pacing and technique for new TEs. Intermittent rest breaks throughout session provided for fatigue.    Personal Factors and Comorbidities Comorbidity 1    Comorbidities Bypass 2013    Examination-Activity Limitations Other    Examination-Participation Restrictions Other    Clinical Decision Making Low    Rehab Potential Excellent    PT Frequency 2x / week    PT Duration 6 weeks    PT Treatment/Interventions ADLs/Self Care Home Management;Cryotherapy;Electrical Stimulation;Ultrasound;Moist Heat;Iontophoresis 4mg /ml Dexamethasone;Therapeutic exercise;Therapeutic activities;Manual techniques;Patient/family education;Vasopneumatic Device    PT Next Visit Plan Continue per POC as symptoms dictate.           Patient will benefit from skilled therapeutic intervention in order to improve the following deficits and  impairments:  Pain, Decreased activity tolerance  Visit Diagnosis: Acute pain of right shoulder     Problem List Patient Active Problem List   Diagnosis Date Noted  . Tremor of right hand 05/02/2020  . Uncontrolled REM sleep behavior disorder 05/02/2020  . Hemiparesis of right dominant side ([place]) 05/02/2020  . Chest pain 06/22/2014  . Essential hypertension 06/22/2014  . Belching 06/22/2014  . Local skin infection 03/16/2013  . Arteriosclerotic cardiovascular disease (ASCVD) 06/26/2012  . Hyperlipidemia     Gabriela Eves, PT, DPT 09/01/2020, 8:18 AM  [facility] [place], [place], [address] Phone: [phone]   Fax:  [phone]  Name: Edward Fisher MRN: [id number] Date of Birth: 1966/08/24

## 2020-09-08 ENCOUNTER — Ambulatory Visit: Payer: Self-pay | Attending: Orthopaedic Surgery | Admitting: Physical Therapy

## 2020-09-08 ENCOUNTER — Other Ambulatory Visit: Payer: Self-pay

## 2020-09-08 DIAGNOSIS — M25511 Pain in right shoulder: Secondary | ICD-10-CM | POA: Insufficient documentation

## 2020-09-08 NOTE — Therapy (Addendum)
[facility] [place], [place], [address] Phone: [phone]   Fax:  [phone]  Physical Therapy Treatment PHYSICAL THERAPY DISCHARGE SUMMARY  Visits from Start of Care: 12  Current functional level related to goals / functional outcomes: See below   Remaining deficits: See goals   Education / Equipment: HEP Plan: Patient agrees to discharge.  Patient goals were partially met. Patient is being discharged due to not returning since the last visit.  ?????     Patient Details  Name: Edward Fisher MRN: [id number] Date of Birth: 26-Aug-1966 Referring Provider (PT): Sanjuana Kava MD   Encounter Date: 09/08/2020   PT End of Session - 09/08/20 0746    Visit Number 12    Number of Visits 12    Date for PT Re-Evaluation 08/08/20    PT Start Time 0743    PT Stop Time 0822    PT Time Calculation (min) 39 min    Activity Tolerance Patient tolerated treatment well    Behavior During Therapy [facility] for tasks assessed/performed           Past Medical History:  Diagnosis Date  . Arteriosclerotic cardiovascular disease (ASCVD)    Onset in 04/2012; CABG in 06/2012 w/ LIMA-LAD, L Rad-CFX, SVG-PL-PDA  . Hyperlipidemia    goal LDL < 70  . Insomnia   . Nephrolithiasis     Past Surgical History:  Procedure Laterality Date  . CORONARY ARTERY BYPASS GRAFT  06/24/2012   Procedure: CORONARY ARTERY BYPASS GRAFTING (CABG);  Surgeon: [name], MD;  Location: [place];  Service: Open Heart Surgery;  Laterality: N/A;  Coronary Artery  Bypass X 4 utilizing endoscopic saphenous vein graft and  Left radial Artery Harvest   . CYSTOSCOPY W/ RETROGRADES Left 08/23/2015   Procedure: CYSTOSCOPY WITH RETROGRADE PYELOGRAM;  Surgeon: [name], MD;  [facility];  Service: Urology;  Laterality: Left;  . CYSTOSCOPY WITH URETEROSCOPY, STONE BASKETRY AND STENT PLACEMENT Left 08/23/2015   Procedure: CYSTOSCOPY WITH URETEROSCOPY, STONE  BASKET EXTRACTION;  Surgeon: [name], MD;  [facility];  Service: Urology;  Laterality: Left;  . HOLMIUM LASER APPLICATION Left 53/97/6734   Procedure: HOLMIUM LASER APPLICATION;  Surgeon: [name], MD;  [facility];  Service: Urology;  Laterality: Left;  . LEFT HEART CATHETERIZATION WITH CORONARY ANGIOGRAM N/A 06/23/2012   Procedure: LEFT HEART CATHETERIZATION WITH CORONARY ANGIOGRAM;  Surgeon: [name], MD;  Location: [facility] C[facility] LAB;  Service: Cardiovascular;  Laterality: N/A;  . RHINOPLASTY    . TONSILLECTOMY  ~1971    There were no vitals filed for this visit.   Subjective Assessment - 09/08/20 0745    Subjective COVID 19 screening performed on patient upon arrival. Patient arrived with ongoing pain in shoulder    Pertinent History ASCVD, CABG.    Diagnostic tests MRI: see imaging    Patient Stated Goals Use left UE without pain.    Currently in Pain? Yes    Pain Score 3     Pain Location Shoulder    Pain Orientation Left    Pain Descriptors / Indicators Discomfort    Pain Type Acute pain    Pain Onset More than a month ago    Pain Frequency Intermittent    Aggravating Factors  work activities and use of shoulder    Pain Relieving Factors at rest  [facility] Adult PT Treatment/Exercise - 09/08/20 0001      Shoulder Exercises: Prone   Retraction Strengthening;Left;20 reps;Weights;10 reps    Retraction Weight (lbs) 4    Extension Strengthening;Left;20 reps;Weights;10 reps    Extension Weight (lbs) 4      Shoulder Exercises: Standing   Protraction Strengthening;Left;20 reps;Theraband;10 reps    Theraband Level (Shoulder Protraction) Level 2 (Red)    External Rotation Strengthening;Left;20 reps;Theraband;10 reps    Theraband Level (Shoulder External Rotation) Level 2 (Red)    Internal Rotation Strengthening;Left;20 reps;Theraband;10 reps    Theraband Level (Shoulder Internal Rotation) Level 2 (Red)     Extension Strengthening;Left;20 reps;Theraband;10 reps    Theraband Level (Shoulder Extension) Level 2 (Red)    Diagonals Strengthening;Both;20 reps;Limitations    Diagonals Limitations Blue XTS; B chop/lift      Shoulder Exercises: ROM/Strengthening   UBE (Upper Arm Bike) 60 RPM x10 min (65mforward, 530mackward)                       PT Long Term Goals - 09/08/20 0747      PT LONG TERM GOAL #1   Title Ind with HEP.    Baseline Met 09/08/20    Time 6    Period Weeks    Status Achieved      PT LONG TERM GOAL #2   Title Perform ADL's with pain not > 2/10.    Baseline Pain can increase up to 7/10 with increased activities 09/08/20    Time 6    Period Weeks    Status On-going                 Plan - 09/08/20 0802    Clinical Impression Statement Patient tolerated treatment well today. Patient continues to perfrom all exercises with good technique and pain around 3-4/10 per reported. Patient reported increased pain up to 7/10 with increased use of shoulder and work activities. Patient understands HEP and is able to perfrom when he can due to work and pain he does not do as often. Patient met LTG #1 with remaining pain goal ongoing due to pain deficts.    Personal Factors and Comorbidities Comorbidity 1    Comorbidities Bypass 2013    Examination-Activity Limitations Other    Examination-Participation Restrictions Other    Stability/Clinical Decision Making Stable/Uncomplicated    Rehab Potential Excellent    PT Frequency 2x / week    PT Duration 6 weeks    PT Treatment/Interventions ADLs/Self Care Home Management;Cryotherapy;Electrical Stimulation;Ultrasound;Moist Heat;Iontophoresis 40m20ml Dexamethasone;Therapeutic exercise;Therapeutic activities;Manual techniques;Patient/family education;Vasopneumatic Device    PT Next Visit Plan on hold pendig MD appt, sent today           Patient will benefit from skilled therapeutic intervention in order to improve the  following deficits and impairments:  Pain, Decreased activity tolerance  Visit Diagnosis: Acute pain of right shoulder     Problem List Patient Active Problem List   Diagnosis Date Noted  . Tremor of right hand 05/02/2020  . Uncontrolled REM sleep behavior disorder 05/02/2020  . Hemiparesis of right dominant side ([facility]6/29/2021  . Chest pain 06/22/2014  . Essential hypertension 06/22/2014  . Belching 06/22/2014  . Local skin infection 03/16/2013  . Arteriosclerotic cardiovascular disease (ASCVD) 06/26/2012  . Hyperlipidemia     ChrLadean RayaTA 09/08/20 8:25 AM  [facility] [address],[place]one: [phone]:  [phone]: MarAristidis Talerico  [place] MRN: [id number] Date of Birth: Mar 13, 1966

## 2020-09-11 ENCOUNTER — Ambulatory Visit: Payer: Self-pay | Admitting: Physician Assistant

## 2020-09-12 ENCOUNTER — Encounter: Payer: Self-pay | Admitting: Orthopaedic Surgery

## 2020-09-12 ENCOUNTER — Ambulatory Visit (INDEPENDENT_AMBULATORY_CARE_PROVIDER_SITE_OTHER): Payer: Self-pay | Admitting: Orthopaedic Surgery

## 2020-09-12 ENCOUNTER — Other Ambulatory Visit: Payer: Self-pay

## 2020-09-12 VITALS — BP 117/74 | HR 77 | Ht 70.0 in | Wt 215.0 lb

## 2020-09-12 DIAGNOSIS — M25512 Pain in left shoulder: Secondary | ICD-10-CM

## 2020-09-12 DIAGNOSIS — G8929 Other chronic pain: Secondary | ICD-10-CM

## 2020-09-12 NOTE — Progress Notes (Signed)
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  He is a Clinical research associate.  He has pain more on days he is working overhead.  MRI showed no rotator cuff tear.  I have told him about possible surgery to remove end of clavicle.  It will be up to him to decide he would like it.  Otherwise, I will see him as needed.  Encounter Diagnosis  Name Primary?  . Chronic left shoulder pain Yes   PRN  Call if any problem.  Precautions discussed.   Electronically Signed Sanjuana Kava, MD 11/9/20212:01 PM

## 2020-09-19 ENCOUNTER — Encounter: Payer: Self-pay | Admitting: Student

## 2020-09-19 ENCOUNTER — Ambulatory Visit: Payer: Self-pay | Admitting: Physician Assistant

## 2020-09-25 ENCOUNTER — Ambulatory Visit: Payer: Self-pay | Admitting: Physician Assistant

## 2020-09-25 ENCOUNTER — Encounter: Payer: Self-pay | Admitting: Physician Assistant

## 2020-09-25 VITALS — BP 120/70 | HR 76 | Temp 97.0°F | Ht 70.0 in | Wt 222.0 lb

## 2020-09-25 DIAGNOSIS — F4321 Adjustment disorder with depressed mood: Secondary | ICD-10-CM

## 2020-09-25 DIAGNOSIS — H6123 Impacted cerumen, bilateral: Secondary | ICD-10-CM

## 2020-09-25 NOTE — Patient Instructions (Signed)
Earwax Buildup, Adult The ears produce a substance called earwax that helps keep bacteria out of the ear and protects the skin in the ear canal. Occasionally, earwax can build up in the ear and cause discomfort or hearing loss. What increases the risk? This condition is more likely to develop in people who:  Are male.  Are elderly.  Naturally produce more earwax.  Clean their ears often with cotton swabs.  Use earplugs often.  Use in-ear headphones often.  Wear hearing aids.  Have narrow ear canals.  Have earwax that is overly thick or sticky.  Have eczema.  Are dehydrated.  Have excess hair in the ear canal. What are the signs or symptoms? Symptoms of this condition include:  Reduced or muffled hearing.  A feeling of fullness in the ear or feeling that the ear is plugged.  Fluid coming from the ear.  Ear pain.  Ear itch.  Ringing in the ear.  Coughing.  An obvious piece of earwax that can be seen inside the ear canal. How is this diagnosed? This condition may be diagnosed based on:  Your symptoms.  Your medical history.  An ear exam. During the exam, your health care provider will look into your ear with an instrument called an otoscope. You may have tests, including a hearing test. How is this treated? This condition may be treated by:  Using ear drops to soften the earwax.  Having the earwax removed by a health care provider. The health care provider may: ? Flush the ear with water. ? Use an instrument that has a loop on the end (curette). ? Use a suction device.  Surgery to remove the wax buildup. This may be done in severe cases. Follow these instructions at home:   Take over-the-counter and prescription medicines only as told by your health care provider.  Do not put any objects, including cotton swabs, into your ear. You can clean the opening of your ear canal with a washcloth or facial tissue.  Follow instructions from your health care  provider about cleaning your ears. Do not over-clean your ears.  Drink enough fluid to keep your urine clear or pale yellow. This will help to thin the earwax.  Keep all follow-up visits as told by your health care provider. If earwax builds up in your ears often or if you use hearing aids, consider seeing your health care provider for routine, preventive ear cleanings. Ask your health care provider how often you should schedule your cleanings.  If you have hearing aids, clean them according to instructions from the manufacturer and your health care provider. Contact a health care provider if:  You have ear pain.  You develop a fever.  You have blood, pus, or other fluid coming from your ear.  You have hearing loss.  You have ringing in your ears that does not go away.  Your symptoms do not improve with treatment.  You feel like the room is spinning (vertigo). Summary  Earwax can build up in the ear and cause discomfort or hearing loss.  The most common symptoms of this condition include reduced or muffled hearing and a feeling of fullness in the ear or feeling that the ear is plugged.  This condition may be diagnosed based on your symptoms, your medical history, and an ear exam.  This condition may be treated by using ear drops to soften the earwax or by having the earwax removed by a health care provider.  Do not put any   objects, including cotton swabs, into your ear. You can clean the opening of your ear canal with a washcloth or facial tissue. This information is not intended to replace advice given to you by your health care provider. Make sure you discuss any questions you have with your health care provider. Document Revised: 10/03/2017 Document Reviewed: 01/01/2017 Elsevier Patient Education  2020 Elsevier Inc.  

## 2020-09-25 NOTE — Progress Notes (Signed)
BP 120/70   Pulse 76   Temp (!) 97 F (36.1 C)   Ht 5\' 10"  (1.778 m)   Wt 222 lb (100.7 kg)   SpO2 98%   BMI 31.85 kg/m    Subjective:    Patient ID: Edward Fisher, male    DOB: [birth year], 54 y.o.   MRN: [id number]  HPI: Edward Fisher is a 54 y.o. male presenting on 09/25/2020 for Mental Health Problem   HPI    Pt had a negative covid 19 screening questionnaire.   Pt is 47yoM who presents for follow up mood.  At last appointment, he was started on prozac but he says it made him feel terrible so he stopped it after about 2 week  He feels pretty good.  He is still sad and has times that are more difficult but his family is supportive and he is still working regularly.  He did not contact [facility] about a support goup.     Relevant past medical, surgical, family and social history reviewed and updated as indicated. Interim medical history since our last visit reviewed. Allergies and medications reviewed and updated.   Current Outpatient Medications:  .  aspirin 81 MG chewable tablet, Chew 81 mg by mouth every evening. TO START DOSAGE IN 6 MONTHS APPROXIMATELY July 2014, Disp: , Rfl:  .  atorvastatin (LIPITOR) 20 MG tablet, Take 1 tablet by mouth once daily, Disp: 30 tablet, Rfl: 4 .  metoprolol succinate (TOPROL-XL) 25 MG 24 hr tablet, Take 1 tablet by mouth once daily, Disp: 90 tablet, Rfl: 2 .  nitroGLYCERIN (NITROSTAT) 0.4 MG SL tablet, Place 1 tablet (0.4 mg total) under the tongue every 5 (five) minutes as needed for chest pain., Disp: 25 tablet, Rfl: 3 .  omega-3 acid ethyl esters (LOVAZA) 1 G capsule, Take 1 g by mouth every evening., Disp: , Rfl:     Review of Systems  Per HPI unless specifically indicated above     Objective:    BP 120/70   Pulse 76   Temp (!) 97 F (36.1 C)   Ht 5\' 10"  (1.778 m)   Wt 222 lb (100.7 kg)   SpO2 98%   BMI 31.85 kg/m   Wt Readings from Last 3 Encounters:  09/25/20 222 lb (100.7 kg)  09/12/20 215 lb (97.5 kg)   07/24/20 221 lb 6.4 oz (100.4 kg)    Physical Exam Vitals reviewed.  Constitutional:      Appearance: He is well-developed.  HENT:     Head: Normocephalic and atraumatic.     Right Ear: There is impacted cerumen.     Left Ear: There is impacted cerumen.  Cardiovascular:     Rate and Rhythm: Normal rate and regular rhythm.  Pulmonary:     Effort: Pulmonary effort is normal.     Breath sounds: Normal breath sounds. No wheezing.  Musculoskeletal:     Cervical back: Neck supple.  Lymphadenopathy:     Cervical: No cervical adenopathy.  Skin:    General: Skin is warm and dry.  Neurological:     Mental Status: He is alert and oriented to person, place, and time.  Psychiatric:        Attention and Perception: Attention normal.        Mood and Affect: Affect is not inappropriate.        Speech: Speech normal.        Behavior: Behavior normal. Behavior is cooperative.  Assessment & Plan:   Encounter Diagnoses  Name Primary?  . Grief Yes  . Bilateral impacted cerumen      -pt had ear lavage and curretting but cerumen stayed in place.  He is instructed to use debrox drops daily and RTO in 1 week to re-lavage -pt to notify office if his grief worsens or develops depression or other problems

## 2020-10-02 ENCOUNTER — Ambulatory Visit: Payer: Self-pay | Admitting: Physician Assistant

## 2020-10-02 ENCOUNTER — Encounter: Payer: Self-pay | Admitting: Physician Assistant

## 2020-10-02 VITALS — BP 126/82 | HR 96 | Temp 97.3°F | Ht 70.0 in | Wt 223.5 lb

## 2020-10-02 DIAGNOSIS — H6123 Impacted cerumen, bilateral: Secondary | ICD-10-CM

## 2020-10-02 NOTE — Progress Notes (Signed)
   BP 126/82   Pulse 96   Temp (!) 97.3 F (36.3 C)   Ht 5\' 10"  (1.778 m)   Wt 223 lb 8 oz (101.4 kg)   SpO2 98%   BMI 32.07 kg/m    Subjective:    Patient ID: Edward Fisher, male    DOB: 04-06-1966, 54 y.o.   MRN: [id number]  HPI: Edward Fisher is a 54 y.o. male presenting on 10/02/2020 for No chief complaint on file.   HPI    Pt had a negative covid 19 screening questionnaire.   Pt is here for follow up cerumen impaction.  He has been using debox drops since his appointment last week as recommended.    Relevant past medical, surgical, family and social history reviewed and updated as indicated. Interim medical history since our last visit reviewed. Allergies and medications reviewed and updated.   Current Outpatient Medications:  .  aspirin 81 MG chewable tablet, Chew 81 mg by mouth every evening. TO START DOSAGE IN 6 MONTHS APPROXIMATELY July 2014, Disp: , Rfl:  .  atorvastatin (LIPITOR) 20 MG tablet, Take 1 tablet by mouth once daily, Disp: 30 tablet, Rfl: 4 .  metoprolol succinate (TOPROL-XL) 25 MG 24 hr tablet, Take 1 tablet by mouth once daily, Disp: 90 tablet, Rfl: 2 .  nitroGLYCERIN (NITROSTAT) 0.4 MG SL tablet, Place 1 tablet (0.4 mg total) under the tongue every 5 (five) minutes as needed for chest pain., Disp: 25 tablet, Rfl: 3 .  omega-3 acid ethyl esters (LOVAZA) 1 G capsule, Take 1 g by mouth every evening., Disp: , Rfl:    Review of Systems  Per HPI unless specifically indicated above     Objective:    BP 126/82   Pulse 96   Temp (!) 97.3 F (36.3 C)   Ht 5\' 10"  (1.778 m)   Wt 223 lb 8 oz (101.4 kg)   SpO2 98%   BMI 32.07 kg/m   Wt Readings from Last 3 Encounters:  10/02/20 223 lb 8 oz (101.4 kg)  09/25/20 222 lb (100.7 kg)  09/12/20 215 lb (97.5 kg)    Physical Exam Constitutional:      General: He is not in acute distress.    Appearance: He is not ill-appearing.  HENT:     Right Ear: There is impacted cerumen.     Left Ear: There is  impacted cerumen.     Ears:     Comments: After lavage, R canal clear and TM normal.  L canal with residual cerumen and unable to visualize TM.   Pulmonary:     Effort: Pulmonary effort is normal. No respiratory distress.  Neurological:     Mental Status: He is alert and oriented to person, place, and time.  Psychiatric:        Behavior: Behavior normal.            Assessment & Plan:    Encounter Diagnosis  Name Primary?  . Bilateral impacted cerumen Yes     Pt is encouraged to continue to use the debrox drops.  Will lavage again at his follow up appointment in January.  He is to RTO sooner prn

## 2020-10-18 ENCOUNTER — Ambulatory Visit: Payer: Self-pay | Admitting: Physician Assistant

## 2020-10-18 VITALS — Temp 98.6°F

## 2020-10-18 DIAGNOSIS — Z23 Encounter for immunization: Secondary | ICD-10-CM

## 2020-10-18 NOTE — Progress Notes (Signed)
Pt is here for flu shot. 0.5 ML of Fluarix Quadrivalent administered at 1448 on R deltoid. Educational material given to patient for review.

## 2020-10-18 NOTE — Patient Instructions (Signed)

## 2020-11-09 ENCOUNTER — Other Ambulatory Visit: Payer: Self-pay | Admitting: Physician Assistant

## 2020-11-09 DIAGNOSIS — I10 Essential (primary) hypertension: Secondary | ICD-10-CM

## 2020-11-09 DIAGNOSIS — E785 Hyperlipidemia, unspecified: Secondary | ICD-10-CM

## 2020-11-09 DIAGNOSIS — R7989 Other specified abnormal findings of blood chemistry: Secondary | ICD-10-CM

## 2020-11-09 DIAGNOSIS — R7309 Other abnormal glucose: Secondary | ICD-10-CM

## 2020-11-09 DIAGNOSIS — Z131 Encounter for screening for diabetes mellitus: Secondary | ICD-10-CM

## 2020-11-09 DIAGNOSIS — Z125 Encounter for screening for malignant neoplasm of prostate: Secondary | ICD-10-CM

## 2020-11-09 DIAGNOSIS — I2510 Atherosclerotic heart disease of native coronary artery without angina pectoris: Secondary | ICD-10-CM

## 2020-11-16 ENCOUNTER — Other Ambulatory Visit: Payer: Self-pay | Admitting: Physician Assistant

## 2020-11-16 MED ORDER — ATORVASTATIN CALCIUM 20 MG PO TABS
20.0000 mg | ORAL_TABLET | Freq: Every day | ORAL | 1 refills | Status: DC
Start: 2020-11-16 — End: 2021-01-19

## 2020-11-28 ENCOUNTER — Ambulatory Visit: Payer: Self-pay | Admitting: Physician Assistant

## 2020-11-28 ENCOUNTER — Other Ambulatory Visit: Payer: Self-pay

## 2020-11-28 ENCOUNTER — Encounter: Payer: Self-pay | Admitting: Physician Assistant

## 2020-11-28 ENCOUNTER — Other Ambulatory Visit (HOSPITAL_COMMUNITY)
Admission: RE | Admit: 2020-11-28 | Discharge: 2020-11-28 | Disposition: A | Discharge status: Home / Self Care | Payer: Self-pay | Source: Ambulatory Visit | Attending: Physician Assistant | Admitting: Physician Assistant

## 2020-11-28 DIAGNOSIS — R7309 Other abnormal glucose: Secondary | ICD-10-CM | POA: Insufficient documentation

## 2020-11-28 DIAGNOSIS — Z131 Encounter for screening for diabetes mellitus: Secondary | ICD-10-CM | POA: Insufficient documentation

## 2020-11-28 DIAGNOSIS — I2510 Atherosclerotic heart disease of native coronary artery without angina pectoris: Secondary | ICD-10-CM

## 2020-11-28 DIAGNOSIS — I10 Essential (primary) hypertension: Secondary | ICD-10-CM | POA: Insufficient documentation

## 2020-11-28 DIAGNOSIS — Z125 Encounter for screening for malignant neoplasm of prostate: Secondary | ICD-10-CM | POA: Insufficient documentation

## 2020-11-28 DIAGNOSIS — E785 Hyperlipidemia, unspecified: Secondary | ICD-10-CM

## 2020-11-28 DIAGNOSIS — R7989 Other specified abnormal findings of blood chemistry: Secondary | ICD-10-CM | POA: Insufficient documentation

## 2020-11-28 DIAGNOSIS — N400 Enlarged prostate without lower urinary tract symptoms: Secondary | ICD-10-CM

## 2020-11-28 LAB — LIPID PANEL
Cholesterol: 168 mg/dL (ref 0–200)
HDL: 27 mg/dL — ABNORMAL LOW (ref >40)
LDL Cholesterol: 105 mg/dL — ABNORMAL HIGH (ref 0–99)
Total CHOL/HDL Ratio: 6.2 RATIO
Triglycerides: 181 mg/dL — ABNORMAL HIGH (ref <150)
VLDL: 36 mg/dL (ref 0–40)

## 2020-11-28 LAB — COMPREHENSIVE METABOLIC PANEL
ALT: 84 U/L — ABNORMAL HIGH (ref 0–44)
AST: 52 U/L — ABNORMAL HIGH (ref 15–41)
Albumin: 4.2 g/dL (ref 3.5–5.0)
Alkaline Phosphatase: 65 U/L (ref 38–126)
Anion gap: 9 (ref 5–15)
BUN: 19 mg/dL (ref 6–20)
CO2: 22 mmol/L (ref 22–32)
Calcium: 9.5 mg/dL (ref 8.9–10.3)
Chloride: 106 mmol/L (ref 98–111)
Creatinine, Ser: 1.08 mg/dL (ref 0.61–1.24)
GFR, Estimated: >60 mL/min (ref >60)
Glucose, Bld: 130 mg/dL — ABNORMAL HIGH (ref 70–99)
Potassium: 3.9 mmol/L (ref 3.5–5.1)
Sodium: 137 mmol/L (ref 135–145)
Total Bilirubin: 1.1 mg/dL (ref 0.3–1.2)
Total Protein: 7.7 g/dL (ref 6.5–8.1)

## 2020-11-28 LAB — PSA: Prostatic Specific Antigen: 1.41 ng/mL (ref 0.00–4.00)

## 2020-11-28 MED ORDER — TAMSULOSIN HCL 0.4 MG PO CAPS: 0.4000 mg | ORAL_CAPSULE | Freq: Every day | ORAL | 4 refills | Status: DC

## 2020-11-28 NOTE — Progress Notes (Unsigned)
There were no vitals taken for this visit.   Subjective:    Patient ID: Edward Fisher, male    DOB: 25-Jan-1966, 55 y.o.   MRN: [id number]  HPI: Edward Fisher is a 55 y.o. male presenting on 11/28/2020 for No chief complaint on file.   HPI   This is a telemedicine appointment through [employer] due to coronavirus pandemic.  I connected with  [name] on 11/28/20 by a video enabled telemedicine application and verified that I am speaking with the correct person using two identifiers.   I discussed the limitations of evaluation and management by telemedicine. The patient expressed understanding and agreed to proceed.  Pt is at home.  Provider is at office.     Pt is 38yoM with CAD.  He works Firefighter.  His is at home today because he Took the day off.  He is still getting to work most days.  He Still misses his wife who died in February 11, 2020 from covid.  Pt says he is Sleeping okay.  He has still not gotten the covid vaccination and doesn't know if he will ever.    He reports that he pees a lot.  He gets up 2 or 3 times each night.   He will usually go about 3 times in the morning (between start of work and lunch time).  He doesn't drink a lot of caffeine. No discomfort with urination.            Relevant past medical, surgical, family and social history reviewed and updated as indicated. Interim medical history since our last visit reviewed. Allergies and medications reviewed and updated.   Current Outpatient Medications:  .  aspirin 81 MG chewable tablet, Chew 81 mg by mouth every evening. TO START DOSAGE IN 6 MONTHS APPROXIMATELY July 2014, Disp: , Rfl:  .  atorvastatin (LIPITOR) 20 MG tablet, Take 1 tablet (20 mg total) by mouth daily., Disp: 90 tablet, Rfl: 1 .  metoprolol succinate (TOPROL-XL) 25 MG 24 hr tablet, Take 1 tablet by mouth once daily, Disp: 90 tablet, Rfl: 2 .  nitroGLYCERIN (NITROSTAT) 0.4 MG SL tablet, Place 1 tablet (0.4 mg total) under the  tongue every 5 (five) minutes as needed for chest pain. (Patient not taking: Reported on 11/28/2020), Disp: 25 tablet, Rfl: 3 .  omega-3 acid ethyl esters (LOVAZA) 1 G capsule, Take 1 g by mouth every evening. (Patient not taking: Reported on 11/28/2020), Disp: , Rfl:     Review of Systems  Per HPI unless specifically indicated above     Objective:    There were no vitals taken for this visit.  Wt Readings from Last 3 Encounters:  10/02/20 223 lb 8 oz (101.4 kg)  09/25/20 222 lb (100.7 kg)  09/12/20 215 lb (97.5 kg)    Physical Exam Constitutional:      General: He is not in acute distress.    Appearance: He is not ill-appearing.  HENT:     Head: Normocephalic and atraumatic.  Pulmonary:     Effort: Pulmonary effort is normal. No respiratory distress.  Neurological:     Mental Status: He is alert and oriented to person, place, and time.  Psychiatric:        Attention and Perception: Attention normal.        Speech: Speech normal.        Behavior: Behavior is cooperative.     Results for orders placed or performed during the hospital encounter  of 11/28/20  Comprehensive metabolic panel  Result Value Ref Range   Sodium 137 135 - 145 mmol/L   Potassium 3.9 3.5 - 5.1 mmol/L   Chloride 106 98 - 111 mmol/L   CO2 22 22 - 32 mmol/L   Glucose, Bld 130 (H) 70 - 99 mg/dL   BUN 19 6 - 20 mg/dL   Creatinine, Ser 1.08 0.61 - 1.24 mg/dL   Calcium 9.5 8.9 - 10.3 mg/dL   Total Protein 7.7 6.5 - 8.1 g/dL   Albumin 4.2 3.5 - 5.0 g/dL   AST 52 (H) 15 - 41 U/L   ALT 84 (H) 0 - 44 U/L   Alkaline Phosphatase 65 38 - 126 U/L   Total Bilirubin 1.1 0.3 - 1.2 mg/dL   GFR, Estimated >60 >60 mL/min   Anion gap 9 5 - 15  Lipid panel  Result Value Ref Range   Cholesterol 168 0 - 200 mg/dL   Triglycerides 181 (H) <150 mg/dL   HDL 27 (L) >40 mg/dL   Total CHOL/HDL Ratio 6.2 RATIO   VLDL 36 0 - 40 mg/dL   LDL Cholesterol 105 (H) 0 - 99 mg/dL  PSA  Result Value Ref Range   Prostatic  Specific Antigen 1.41 0.00 - 4.00 ng/mL      Assessment & Plan:    Encounter Diagnoses  Name Primary?  [name] Arteriosclerotic cardiovascular disease (ASCVD) Yes  . Elevated LFTs   . Hyperlipidemia, unspecified hyperlipidemia type   . Benign prostatic hyperplasia, unspecified whether lower urinary tract symptoms present       -reviewed labs with pt  -pt to continue current medications -he is encouraged to watch lowfat diet -trial of flomax (to wm mayodan) -encouraged and Recommend covid vaccination -pt to follow up 3 months.  He is to contact office sooner prn

## 2020-11-29 LAB — HEMOGLOBIN A1C
Hgb A1c MFr Bld: 6.1 % — ABNORMAL HIGH (ref 4.8–5.6)
Mean Plasma Glucose: 128 mg/dL

## 2021-01-18 NOTE — Progress Notes (Signed)
Cardiology Office Note   Date:  01/19/2021   ID:  Wylder, Macomber [birth year], MRN [id number]  PCP:  Soyla Dryer, PA-C  Cardiologist:   Minus Breeding, MD   Chief Complaint  Patient presents with  . Weakness      History of Present Illness: Edward Fisher is a 55 y.o. male who presents for follow up of CAD (CABG 2013 LIMA-LAD, left radial-circumflex, SVG-PL-PDA).  Since he was last seen he has not had any new cardiac complaints.  He gets some mild chronic dyspnea with exertion but nothing like he had with his disease before bypass.  He gets some fleeting chest discomfort.  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  His biggest complaint was a tremor.  He went to see a neurologist and I reviewed an MRI and there were no specific findings and he was told he would not likely not Parkinson's.  However, he is also had weakness in his right foot.  He notices this when he is trying to keep rhythm with his foot.  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  He has not had any numbness but he has some tingling.  He does not have any chest pressure, neck or arm discomfort.  He does not have any presyncope or syncope.  He has had some fleeting palpitations but these are rare.  He has been somewhat lonely since his wife died of Covid.  She had a liver transplant.  He stays a lot with his daughter or his son rather than staying at home by himself.    Past Medical History:  Diagnosis Date  . Arteriosclerotic cardiovascular disease (ASCVD)    Onset in 04/2012; CABG in 06/2012 w/ LIMA-LAD, L Rad-CFX, SVG-PL-PDA  . Hyperlipidemia    goal LDL < 70  . Insomnia   . Nephrolithiasis     Past Surgical History:  Procedure Laterality Date  . CORONARY ARTERY BYPASS GRAFT  06/24/2012   Procedure: CORONARY ARTERY BYPASS GRAFTING (CABG);  Surgeon: [name], MD;  Location: [place];  Service: Open Heart Surgery;  Laterality: N/A;  Coronary Artery   Bypass X 4 utilizing endoscopic saphenous vein graft and  Left radial Artery Harvest   . CYSTOSCOPY W/ RETROGRADES Left 08/23/2015   Procedure: CYSTOSCOPY WITH RETROGRADE PYELOGRAM;  Surgeon: [name], MD;  Location: [facility];  Service: Urology;  Laterality: Left;  . CYSTOSCOPY WITH URETEROSCOPY, STONE BASKETRY AND STENT PLACEMENT Left 08/23/2015   Procedure: CYSTOSCOPY WITH URETEROSCOPY, STONE BASKET EXTRACTION;  Surgeon: [name], MD;  Location: [facility];  Service: Urology;  Laterality: Left;  . HOLMIUM LASER APPLICATION Left 23/76/2831   Procedure: HOLMIUM LASER APPLICATION;  Surgeon: [name], MD;  Location: [facility];  Service: Urology;  Laterality: Left;  . LEFT HEART CATHETERIZATION WITH CORONARY ANGIOGRAM N/A 06/23/2012   Procedure: LEFT HEART CATHETERIZATION WITH CORONARY ANGIOGRAM;  Surgeon: [name], MD;  [facility];  Service: Cardiovascular;  Laterality: N/A;  . RHINOPLASTY    . TONSILLECTOMY  ~1971     Current Outpatient Medications  Medication Sig Dispense Refill  . aspirin 81 MG chewable tablet Chew 81 mg by mouth every evening. TO START DOSAGE IN 6 MONTHS APPROXIMATELY July 2014    . atorvastatin (LIPITOR) 20 MG tablet Take 1 tablet (20 mg total) by mouth daily. 90 tablet 1  . metoprolol succinate (TOPROL-XL) 25 MG 24 hr tablet  Take 1 tablet by mouth once daily 90 tablet 2  . nitroGLYCERIN (NITROSTAT) 0.4 MG SL tablet Place 1 tablet (0.4 mg total) under the tongue every 5 (five) minutes as needed for chest pain. 25 tablet 3  . omega-3 acid ethyl esters (LOVAZA) 1 G capsule Take 1 g by mouth every evening.    . tamsulosin (FLOMAX) 0.4 MG CAPS capsule Take 1 capsule (0.4 mg total) by mouth daily. 30 capsule 4   No current facility-administered medications for this visit.    Allergies:   Prozac [fluoxetine] and Pollen extract   ROS:  Please see the history of present illness.   Otherwise, review of systems are positive for none.   All  other systems are reviewed and negative.    PHYSICAL EXAM: VS:  BP 138/90   Pulse 82   Ht 5\' 10"  (1.778 m)   Wt 211 lb (95.7 kg)   SpO2 98%   BMI 30.28 kg/m  , BMI Body mass index is 30.28 kg/m. GENERAL:  Well appearing NECK:  No jugular venous distention, waveform within normal limits, carotid upstroke brisk and symmetric, no bruits, no thyromegaly LUNGS:  Clear to auscultation bilaterally BACK:  No CVA tenderness CHEST:  Well healed sternotomy scar. HEART:  PMI not displaced or sustained,S1 and S2 within normal limits, no S3, no S4, no clicks, no rubs, no murmurs ABD:  Flat, positive bowel sounds normal in frequency in pitch, no bruits, no rebound, no guarding, no midline pulsatile mass, no hepatomegaly, no splenomegaly EXT:  2 plus pulses throughout, no edema, no cyanosis no clubbing SKIN:  No rashes no nodules NEURO:  Cranial nerves II through XII grossly intact, weakness to extension of his foot on the right, mild resting tremor noted on the right arm   EKG:  EKG is not ordered today. The ekg ordered 05/25/2020 demonstrates sinus rhythm, rate 93, axis within normal limits, nonspecific T wave changes, baseline artifact   Recent Labs: 05/25/2020: Hemoglobin 16.2; Platelets 123 11/28/2020: ALT 84; BUN 19; Creatinine, Ser 1.08; Potassium 3.9; Sodium 137    Lipid Panel    Component Value Date/Time   CHOL 168 11/28/2020 0842   TRIG 181 (H) 11/28/2020 0842   HDL 27 (L) 11/28/2020 0842   CHOLHDL 6.2 11/28/2020 0842   VLDL 36 11/28/2020 0842   LDLCALC 105 (H) 11/28/2020 0842      Wt Readings from Last 3 Encounters:  01/19/21 211 lb (95.7 kg)  10/02/20 223 lb 8 oz (101.4 kg)  09/25/20 222 lb (100.7 kg)      Other studies Reviewed: Additional studies/ records that were reviewed today include: MRI. Review of the above records demonstrates:  Please see elsewhere in the note.     ASSESSMENT AND PLAN:  CAD in native artery:    The patient has no new sypmtoms.  No further  cardiovascular testing is indicated.  We will continue with aggressive risk reduction and meds as listed.  Dyslipidemia: He is not quite at target with his lipids and I would like to increase his Lipitor to 20 mg daily and repeat a lipid and liver in 10 weeks.  Weakness: He has a notable strength difference in his right foot.  He is already seen a neurologist.  I gave him the name of neurosurgery to evaluate to make sure this was not a back problem.   Current medicines are reviewed at length with the patient today.  The patient does not have concerns regarding medicines.  The following  changes have been made:  no change  Labs/ tests ordered today include:  No orders of the defined types were placed in this encounter.    Disposition:   FU with me or Eden MD in one year.     Signed, Minus Breeding, MD  01/19/2021 4:36 PM    [facility]

## 2021-01-19 ENCOUNTER — Ambulatory Visit (INDEPENDENT_AMBULATORY_CARE_PROVIDER_SITE_OTHER): Payer: Self-pay | Admitting: Cardiology

## 2021-01-19 ENCOUNTER — Encounter: Payer: Self-pay | Admitting: Cardiology

## 2021-01-19 VITALS — BP 138/90 | HR 82 | Ht 70.0 in | Wt 211.0 lb

## 2021-01-19 DIAGNOSIS — I2510 Atherosclerotic heart disease of native coronary artery without angina pectoris: Secondary | ICD-10-CM

## 2021-01-19 DIAGNOSIS — E785 Hyperlipidemia, unspecified: Secondary | ICD-10-CM

## 2021-01-19 DIAGNOSIS — Z79899 Other long term (current) drug therapy: Secondary | ICD-10-CM

## 2021-01-19 MED ORDER — ATORVASTATIN CALCIUM 40 MG PO TABS: 40.0000 mg | ORAL_TABLET | Freq: Every day | ORAL | 3 refills | Status: DC

## 2021-01-19 NOTE — Patient Instructions (Addendum)
Medication Instructions:   Your physician has recommended you make the following change in your medication:   Increase atorvastatin to 40 mg by mouth daily  Continue other medications the same  Labwork: Your physician recommends that you return for a FASTING lipid/liver profile in 10 weeks. Please do not eat or drink for at least 8 hours when you have this done. You may take your medications that morning with a sip of water.  Please have this done at [facility] faxed   Testing/Procedures:  none  Follow-Up:  Your physician recommends that you schedule a follow-up appointment in: 1 year. You will receive a reminder letter in the mail in about 10 months reminding you to call and schedule your appointment. If you don't receive this letter, please contact our office.  Any Other Special Instructions Will Be Listed Below (If Applicable).  [facility] [phone]  If you need a refill on your cardiac medications before your next appointment, please call your pharmacy.

## 2021-01-19 NOTE — Addendum Note (Signed)
Addended by: Merlene Laughter on: 01/19/2021 04:48 PM   Modules accepted: Orders

## 2021-01-29 ENCOUNTER — Other Ambulatory Visit: Payer: Self-pay

## 2021-01-29 ENCOUNTER — Ambulatory Visit: Payer: Self-pay | Attending: Orthopedic Surgery | Admitting: Physical Therapy

## 2021-01-29 ENCOUNTER — Encounter: Payer: Self-pay | Admitting: Physical Therapy

## 2021-01-29 DIAGNOSIS — R293 Abnormal posture: Secondary | ICD-10-CM | POA: Insufficient documentation

## 2021-01-29 DIAGNOSIS — M5412 Radiculopathy, cervical region: Secondary | ICD-10-CM | POA: Insufficient documentation

## 2021-01-29 DIAGNOSIS — M6281 Muscle weakness (generalized): Secondary | ICD-10-CM | POA: Insufficient documentation

## 2021-01-29 DIAGNOSIS — M5416 Radiculopathy, lumbar region: Secondary | ICD-10-CM | POA: Insufficient documentation

## 2021-01-29 NOTE — Therapy (Signed)
[facility] [place], [place], [address] Phone: [phone]   Fax:  [phone]  Physical Therapy Evaluation  Patient Details  Name: Edward Fisher MRN: [id number] Date of Birth: 16-May-1966 Referring Provider (PT): Edmonia Lynch, MD   Encounter Date: 01/29/2021   PT End of Session - 01/29/21 1304    Visit Number 1    Number of Visits 12    Date for PT Re-Evaluation 03/19/21    Authorization Type Self-pay; Progress note very 10th visit    PT Start Time 0815    PT Stop Time 0858    PT Time Calculation (min) 43 min    Activity Tolerance Patient tolerated treatment well    Behavior During Therapy [facility] for tasks assessed/performed           Past Medical History:  Diagnosis Date  . Arteriosclerotic cardiovascular disease (ASCVD)    Onset in 04/2012; CABG in 06/2012 w/ LIMA-LAD, L Rad-CFX, SVG-PL-PDA  . Hyperlipidemia    goal LDL < 70  . Insomnia   . Nephrolithiasis     Past Surgical History:  Procedure Laterality Date  . CORONARY ARTERY BYPASS GRAFT  06/24/2012   Procedure: CORONARY ARTERY BYPASS GRAFTING (CABG);  Surgeon: [name], MD;  Location: [place];  Service: Open Heart Surgery;  Laterality: N/A;  Coronary Artery  Bypass X 4 utilizing endoscopic saphenous vein graft and  Left radial Artery Harvest   . CYSTOSCOPY W/ RETROGRADES Left 08/23/2015   Procedure: CYSTOSCOPY WITH RETROGRADE PYELOGRAM;  Surgeon: [name], MD;  Location[facility];  Service: Urology;  Laterality: Left;  . CYSTOSCOPY WITH URETEROSCOPY, STONE BASKETRY AND STENT PLACEMENT Left 08/23/2015   Procedure: CYSTOSCOPY WITH URETEROSCOPY, STONE BASKET EXTRACTION;  Surgeon: [name], MD;  Location: [facility];  Service: Urology;  Laterality: Left;  . HOLMIUM LASER APPLICATION Left 76/73/4193   Procedure: HOLMIUM LASER APPLICATION;  Surgeon: [name], MD;  Location: [facility];  Service: Urology;  Laterality: Left;  . LEFT HEART  CATHETERIZATION WITH CORONARY ANGIOGRAM N/A 06/23/2012   Procedure: LEFT HEART CATHETERIZATION WITH CORONARY ANGIOGRAM;  Surgeon: [name], MD;  Location: [facility] CATH LAB;  Service: Cardiovascular;  Laterality: N/A;  . RHINOPLASTY    . TONSILLECTOMY  ~1971    There were no vitals filed for this visit.    Subjective Assessment - 01/29/21 1305    Subjective COVID-19 screening performed upon arrival. Patient arrives to physical therapy with right sided UE and LE weakness, difficulty walking, and intermittent R LE pain that began about one year ago due to an insidious onset. Patient reports R UE weakness and fine motor control especially while playing his muscial instruments. Patient denies any numbness or tingling in R UE. Patient reports intermittent R LE pain and numbness especially when coming from a squat position to standing that dissipates rather quickly. Patient feels like his R leg drags whille he walks as well.  Patient has not had any falls but reports a decrease in activity levels secondary to fear of falling. Patient reports mild pain when he gets it intermittently but mainly does not experience pain. Patient's goals are to improve movement and play music again.    Pertinent History history of CABG x3    Limitations Sitting;Lifting;Standing;Walking;House hold activities    Diagnostic tests x-ray: arthritis in spine; MRI cleared of stroke    Patient Stated Goals play musical instruments    Currently in Pain? No/denies  [facility] PT Assessment - 01/29/21 0001      Assessment   Medical Diagnosis radicular symptoms on right side from C-spine and L-spine    Referring Provider (PT) Edmonia Lynch, MD    Onset Date/Surgical Date --   over 1 year, exacerbation about 1 month ago   Hand Dominance Right    Next MD Visit April 2022    Prior Therapy yes      Precautions   Precautions None      Restrictions   Weight Bearing Restrictions No      Balance Screen   Has the  patient fallen in the past 6 months No    Has the patient had a decrease in activity level because of a fear of falling?  Yes    Is the patient reluctant to leave their home because of a fear of falling?  No      Home [employer] residence      Prior Function   Level of Independence Independent      Sensation   Light Touch Appears Intact      Posture/Postural Control   Posture/Postural Control Postural limitations    Postural Limitations Rounded Shoulders;Forward head;Increased thoracic kyphosis      Deep Tendon Reflexes   DTR Assessment Site Patella;Achilles;Triceps;Brachioradialis;Biceps    Biceps DTR 2+   bilaterally   Brachioradialis DTR 2+   bilaterally   Triceps DTR 1+;2+   R 1+; L 2+   Patella DTR 1+;2+   R 1+; L 2+   Achilles DTR 1+;2+   R 1+; L 2+     ROM / Strength   AROM / PROM / Strength AROM;Strength      AROM   Overall AROM Comments bilateral shoulder AROM WFL    AROM Assessment Site Lumbar;Cervical    Cervical Flexion 48    Cervical Extension 54    Cervical - Right Side Bend 28    Cervical - Left Side Bend 22    Cervical - Right Rotation 53    Cervical - Left Rotation 63    Lumbar Flexion 15" finger tip to floor    Lumbar Extension 9 degrees   tightness and pain in lower back   Lumbar - Right Side Bend 25.5" finger tip to floor    Lumbar - Left Side Bend 22.5" finger tip to floor      Strength   Overall Strength Deficits    Strength Assessment Site Shoulder;Hip;Knee;Hand    Right/Left Shoulder Right;Left    Right Shoulder Flexion 3+/5    Right Shoulder ABduction 3+/5    Right Shoulder Internal Rotation 3+/5    Right Shoulder External Rotation 3+/5    Left Shoulder Flexion 4/5    Left Shoulder ABduction 4/5    Left Shoulder Internal Rotation 4/5    Left Shoulder External Rotation 4/5    Right/Left hand Right;Left    Right Hand Grip (lbs) 35 lbs    Left Hand Grip (lbs) 85 lbs    Right/Left Hip Right;Left    Right Hip  Flexion 3-/5    Right Hip ABduction 3-/5    Left Hip Flexion 3+/5    Left Hip ABduction 3+/5    Right/Left Knee Left;Right    Right Knee Flexion 3+/5    Right Knee Extension 3+/5    Left Knee Flexion 4/5    Left Knee Extension 4/5      Palpation   Palpation comment minimal tenderness to palpation  to R UE and cervical spine      Transfers   Five time sit to stand comments  21.1 seconds no UE support      Ambulation/Gait   Gait Pattern Step-through pattern;Decreased stride length;Decreased step length - right;Decreased weight shift to right;Decreased dorsiflexion - right;Right foot flat;Poor foot clearance - right                      Objective measurements completed on examination: See above findings.               PT Education - 01/29/21 1303    Education Details scapular retractions, ab bracing, alternate UE/LE marching, heel toe raises    Person(s) Educated Patient    Methods Explanation;Demonstration;Handout    Comprehension Verbalized understanding            PT Short Term Goals - 01/29/21 1318      PT SHORT TERM GOAL #1   Title STG=LTG             PT Long Term Goals - 01/29/21 1319      PT LONG TERM GOAL #1   Title Patient will be independent with HEP    Baseline --    Time 6    Status New      PT LONG TERM GOAL #2   Title Patient will demonstrate 4/5 or greater right LE and UE MMT in all planes to improve stability during functional tasks.    Time 6    Period Weeks    Status New      PT LONG TERM GOAL #3   Title Patient will demosntrate 50+ lb of R grip strength to improve fine motor tasks.    Time 6    Period Weeks    Status New      PT LONG TERM GOAL #4   Title Patient will demonstrate proper lifting and squat techniques for work activities and no reports of R LE shooting pain.    Time 6    Period Weeks    Status New                  Plan - 01/29/21 1313    Clinical Impression Statement Patient is a 55  year old male who presents to physical therapy with decreased right sided LE and UE MMT and abnormal gait that began about one year ago with symptoms exacerbating over the past month. Patients shoulder AROM is WFL with no reports of pain. Patient ambulates without an AD with decreased stride lengths, decreased R foot clearance, R foot flat contact, and forward flexed trunk. Patient with diminished right LE DTR in comparison to left and WNL LE and UE dermatomes when assessed. Patient and PT discussed POC and HEP to which patient reported understanding. Patient would benefit from skilled physical therapy to address deficits and address patient's goals.    Personal Factors and Comorbidities Age;Comorbidity 1    Comorbidities history of CABG x3    Examination-Activity Limitations Carry;Locomotion Level;Sleep;Squat;Stairs;Stand;Reach Overhead    Examination-Participation Restrictions Other   playing musical instruments   Stability/Clinical Decision Making Evolving/Moderate complexity    Clinical Decision Making Moderate    Rehab Potential Fair    PT Frequency 2x / week    PT Duration 6 weeks    PT Treatment/Interventions ADLs/Self Care Home Management;Moist Heat;Electrical Stimulation;Ultrasound;Gait training;Stair training;Iontophoresis 4mg /ml Dexamethasone;Functional mobility training;Therapeutic activities;Therapeutic exercise;Balance training;Neuromuscular re-education;Manual techniques;Passive range of motion;Patient/family education    PT Next  Visit Plan nustep, UBE, LE & UE strengthening, postural exercises and stretching, R grip strengthening, modalities PRN for pain relief    PT Home Exercise Plan see patient education section    Consulted and Agree with Plan of Care Patient           Patient will benefit from skilled therapeutic intervention in order to improve the following deficits and impairments:  Abnormal gait,Decreased range of motion,Difficulty walking,Decreased balance,Decreased  activity tolerance,Decreased strength,Postural dysfunction,Pain,Impaired UE functional use  Visit Diagnosis: Radiculopathy, cervical region - Plan: PT plan of care cert/re-cert  Radiculopathy, lumbar region - Plan: PT plan of care cert/re-cert  Abnormal posture - Plan: PT plan of care cert/re-cert  Muscle weakness (generalized) - Plan: PT plan of care cert/re-cert     Problem List Patient Active Problem List   Diagnosis Date Noted  . Tremor of right hand 05/02/2020  . Uncontrolled REM sleep behavior disorder 05/02/2020  . Hemiparesis of right dominant side ([place]) 05/02/2020  . Chest pain 06/22/2014  . Essential hypertension 06/22/2014  . Belching 06/22/2014  . Local skin infection 03/16/2013  . Arteriosclerotic cardiovascular disease (ASCVD) 06/26/2012  . Hyperlipidemia     Gabriela Eves, PT, DPT 01/29/2021, 1:25 PM  [facility] [address] [place], [place], [address] Phone: [phone]   Fax:  [phone]  Name: ABB GOBERT MRN: [id number] Date of Birth: 1966/01/18

## 2021-02-01 ENCOUNTER — Other Ambulatory Visit: Payer: Self-pay

## 2021-02-01 ENCOUNTER — Ambulatory Visit: Payer: Self-pay | Admitting: Physical Therapy

## 2021-02-01 DIAGNOSIS — M6281 Muscle weakness (generalized): Secondary | ICD-10-CM

## 2021-02-01 DIAGNOSIS — M5416 Radiculopathy, lumbar region: Secondary | ICD-10-CM

## 2021-02-01 DIAGNOSIS — R293 Abnormal posture: Secondary | ICD-10-CM

## 2021-02-01 DIAGNOSIS — M5412 Radiculopathy, cervical region: Secondary | ICD-10-CM

## 2021-02-01 NOTE — Therapy (Signed)
[facility] [place], [place], [address] Phone: [phone]   Fax:  [phone]  Physical Therapy Treatment  Patient Details  Name: Edward Fisher MRN: [id number] Date of Birth: 1966/04/18 Referring Provider (PT): Edmonia Lynch, MD   Encounter Date: 02/01/2021   PT End of Session - 02/01/21 0845    Visit Number 2    Number of Visits 12    Date for PT Re-Evaluation 03/19/21    Authorization Type Self-pay; Progress note very 10th visit    PT Start Time 0815    PT Stop Time 0857    PT Time Calculation (min) 42 min    Activity Tolerance Patient tolerated treatment well    Behavior During Therapy [facility] for tasks assessed/performed           Past Medical History:  Diagnosis Date  . Arteriosclerotic cardiovascular disease (ASCVD)    Onset in 04/2012; CABG in 06/2012 w/ LIMA-LAD, L Rad-CFX, SVG-PL-PDA  . Hyperlipidemia    goal LDL < 70  . Insomnia   . Nephrolithiasis     Past Surgical History:  Procedure Laterality Date  . CORONARY ARTERY BYPASS GRAFT  06/24/2012   Procedure: CORONARY ARTERY BYPASS GRAFTING (CABG);  Surgeon: [name], MD;  Location: [place];  Service: Open Heart Surgery;  Laterality: N/A;  Coronary Artery  Bypass X 4 utilizing endoscopic saphenous vein graft and  Left radial Artery Harvest   . CYSTOSCOPY W/ RETROGRADES Left 08/23/2015   Procedure: CYSTOSCOPY WITH RETROGRADE PYELOGRAM;  Surgeon: [name], MD;  Location: [facility];  Service: Urology;  Laterality: Left;  . CYSTOSCOPY WITH URETEROSCOPY, STONE BASKETRY AND STENT PLACEMENT Left 08/23/2015   Procedure: CYSTOSCOPY WITH URETEROSCOPY, STONE BASKET EXTRACTION;  Surgeon: [name], MD;  Location: [facility];  Service: Urology;  Laterality: Left;  . HOLMIUM LASER APPLICATION Left 89/21/1941   Procedure: HOLMIUM LASER APPLICATION;  Surgeon: [name], MD;  Location: [facility];  Service: Urology;  Laterality: Left;  . LEFT HEART  CATHETERIZATION WITH CORONARY ANGIOGRAM N/A 06/23/2012   Procedure: LEFT HEART CATHETERIZATION WITH CORONARY ANGIOGRAM;  Surgeon: [name], MD;  Location: [facility] CATH LAB;  Service: Cardiovascular;  Laterality: N/A;  . RHINOPLASTY    . TONSILLECTOMY  ~1971    There were no vitals filed for this visit.   Subjective Assessment - 02/01/21 0820    Subjective COVID-19 screening performed upon arrival. Patient arrived doing well today.    Pertinent History history of CABG x3    Limitations Sitting;Lifting;Standing;Walking;House hold activities    Diagnostic tests x-ray: arthritis in spine; MRI cleared of stroke    Patient Stated Goals play musical instruments    Currently in Pain? No/denies                             [facility] Adult PT Treatment/Exercise - 02/01/21 0001      Exercises   Exercises Lumbar;Shoulder;Knee/Hip      Lumbar Exercises: Aerobic   Nustep N4 x71min UE/LE activity      Lumbar Exercises: Supine   Clam 20 reps   green band   Bridge 20 reps      Knee/Hip Exercises: Standing   Forward Step Up Right;2 sets;10 reps;Step Height: 6";Hand Hold: 1    Step Down 2 sets;10 reps;Step Height: 6";Step Height: 2"    Rocker Board 2 minutes      Knee/Hip Exercises: Seated   Long Arc  Quad Strengthening;Right;3 sets;10 reps;Weights    Long [employer] 4 lbs.    Hamstring Curl Strengthening;Right;20 reps    Hamstring Limitations green band      Shoulder Exercises: Seated   Other Seated Exercises yellow puty 71min issued for home      Shoulder Exercises: Standing   Protraction Strengthening;Right;20 reps;Theraband    Theraband Level (Shoulder Protraction) Level 2 (Red)    External Rotation Strengthening;Right;20 reps;Theraband    Theraband Level (Shoulder External Rotation) Level 2 (Red)    Internal Rotation Strengthening;Right;20 reps;Theraband    Theraband Level (Shoulder Internal Rotation) Level 2 (Red)    Extension Strengthening;Right;20  reps;Theraband    Theraband Level (Shoulder Extension) Level 2 (Red)    Diagonals Strengthening;Right;Weights   2# ball 2 x10                   PT Short Term Goals - 01/29/21 1318      PT SHORT TERM GOAL #1   Title STG=LTG             PT Long Term Goals - 02/01/21 0846      PT LONG TERM GOAL #1   Title Patient will be independent with HEP    Time 6    Period Weeks    Status On-going      PT LONG TERM GOAL #2   Title Patient will demonstrate 4/5 or greater right LE and UE MMT in all planes to improve stability during functional tasks.    Time 6    Period Weeks    Status On-going      PT LONG TERM GOAL #3   Title Patient will demosntrate 50+ lb of R grip strength to improve fine motor tasks.    Time 6    Period Weeks    Status On-going      PT LONG TERM GOAL #4   Title Patient will demonstrate proper lifting and squat techniques for work activities and no reports of R LE shooting pain.    Time 6    Period Weeks    Status On-going                 Plan - 02/01/21 0902    Clinical Impression Statement Patient tolerated treatment well today. Today focued on right UE and LE strengthening progression to improve functional independence. Patient doing initial HEP as directed. Today issued yellow putty for home progression. patient current goals ongoing due to limitations.    Personal Factors and Comorbidities Age;Comorbidity 1    Comorbidities history of CABG x3    Examination-Activity Limitations Carry;Locomotion Level;Sleep;Squat;Stairs;Stand;Reach Overhead    Examination-Participation Restrictions Other    Stability/Clinical Decision Making Evolving/Moderate complexity    Rehab Potential Fair    PT Frequency 2x / week    PT Duration 6 weeks    PT Treatment/Interventions ADLs/Self Care Home Management;Moist Heat;Electrical Stimulation;Ultrasound;Gait training;Stair training;Iontophoresis 4mg /ml Dexamethasone;Functional mobility training;Therapeutic  activities;Therapeutic exercise;Balance training;Neuromuscular re-education;Manual techniques;Passive range of motion;Patient/family education    PT Next Visit Plan nustep, UBE, LE & UE strengthening, postural exercises and stretching, R grip strengthening, modalities PRN for pain relief    Consulted and Agree with Plan of Care Patient           Patient will benefit from skilled therapeutic intervention in order to improve the following deficits and impairments:  Abnormal gait,Decreased range of motion,Difficulty walking,Decreased balance,Decreased activity tolerance,Decreased strength,Postural dysfunction,Pain,Impaired UE functional use  Visit Diagnosis: Radiculopathy, cervical region  Radiculopathy, lumbar region  Abnormal posture  Muscle weakness (generalized)     Problem List Patient Active Problem List   Diagnosis Date Noted  . Tremor of right hand 05/02/2020  . Uncontrolled REM sleep behavior disorder 05/02/2020  . Hemiparesis of right dominant side ([place]) 05/02/2020  . Chest pain 06/22/2014  . Essential hypertension 06/22/2014  . Belching 06/22/2014  . Local skin infection 03/16/2013  . Arteriosclerotic cardiovascular disease (ASCVD) 06/26/2012  . Hyperlipidemia     Kaiyla Stahly P, PTA 02/01/2021, 9:04 AM  [facility] [place], [place], [address] Phone: [phone]   Fax:  [phone]  Name: Edward Fisher MRN: [id number] Date of Birth: [birth year]

## 2021-02-06 ENCOUNTER — Other Ambulatory Visit: Payer: Self-pay

## 2021-02-06 ENCOUNTER — Ambulatory Visit: Payer: Self-pay | Attending: Orthopedic Surgery | Admitting: Physical Therapy

## 2021-02-06 ENCOUNTER — Encounter: Payer: Self-pay | Admitting: Physical Therapy

## 2021-02-06 DIAGNOSIS — M6281 Muscle weakness (generalized): Secondary | ICD-10-CM | POA: Insufficient documentation

## 2021-02-06 DIAGNOSIS — M5416 Radiculopathy, lumbar region: Secondary | ICD-10-CM | POA: Insufficient documentation

## 2021-02-06 DIAGNOSIS — M5412 Radiculopathy, cervical region: Secondary | ICD-10-CM | POA: Insufficient documentation

## 2021-02-06 DIAGNOSIS — R293 Abnormal posture: Secondary | ICD-10-CM | POA: Insufficient documentation

## 2021-02-06 NOTE — Therapy (Signed)
[facility] [place], [place], [address] Phone: [phone]   Fax:  [phone]  Physical Therapy Treatment  Patient Details  Name: Edward Fisher MRN: [id number] Date of Birth: [birth year] Referring Provider (PT): Edmonia Lynch, MD   Encounter Date: 02/06/2021   PT End of Session - 02/06/21 0818    Visit Number 3    Number of Visits 12    Date for PT Re-Evaluation 03/19/21    Authorization Type Self-pay; Progress note very 10th visit    PT Start Time 0817    PT Stop Time 0900    PT Time Calculation (min) 43 min    Activity Tolerance Patient tolerated treatment well    Behavior During Therapy [facility] for tasks assessed/performed           Past Medical History:  Diagnosis Date  . Arteriosclerotic cardiovascular disease (ASCVD)    Onset in 04/2012; CABG in 06/2012 w/ LIMA-LAD, L Rad-CFX, SVG-PL-PDA  . Hyperlipidemia    goal LDL < 70  . Insomnia   . Nephrolithiasis     Past Surgical History:  Procedure Laterality Date  . CORONARY ARTERY BYPASS GRAFT  06/24/2012   Procedure: CORONARY ARTERY BYPASS GRAFTING (CABG);  Surgeon: [name], MD;  Location: [place];  Service: Open Heart Surgery;  Laterality: N/A;  Coronary Artery  Bypass X 4 utilizing endoscopic saphenous vein graft and  Left radial Artery Harvest   . CYSTOSCOPY W/ RETROGRADES Left 08/23/2015   Procedure: CYSTOSCOPY WITH RETROGRADE PYELOGRAM;  Surgeon: [name], MD;  [facility];  Service: Urology;  Laterality: Left;  . CYSTOSCOPY WITH URETEROSCOPY, STONE BASKETRY AND STENT PLACEMENT Left 08/23/2015   Procedure: CYSTOSCOPY WITH URETEROSCOPY, STONE BASKET EXTRACTION;  Surgeon: [name], MD;  Location: [facility];  Service: Urology;  Laterality: Left;  . HOLMIUM LASER APPLICATION Left 02/58/5277   Procedure: HOLMIUM LASER APPLICATION;  Surgeon: [name], MD;  Location[facility];  Service: Urology;  Laterality: Left;  . LEFT HEART  CATHETERIZATION WITH CORONARY ANGIOGRAM N/A 06/23/2012   Procedure: LEFT HEART CATHETERIZATION WITH CORONARY ANGIOGRAM;  Surgeon: [name], MD;  Location: [facility] CATH LAB;  Service: Cardiovascular;  Laterality: N/A;  . RHINOPLASTY    . TONSILLECTOMY  ~1971    There were no vitals filed for this visit.   Subjective Assessment - 02/06/21 0817    Subjective COVID-19 screening performed upon arrival. Patient arrived doing well today. Reports weakness of RUE, RLE.    Pertinent History history of CABG x3    Limitations Sitting;Lifting;Standing;Walking;House hold activities    Diagnostic tests x-ray: arthritis in spine; MRI cleared of stroke    Patient Stated Goals play musical instruments    Currently in Pain? No/denies              [facility] PT Assessment - 02/06/21 0001      Assessment   Medical Diagnosis radicular symptoms on right side from C-spine and L-spine    Referring Provider (PT) Edmonia Lynch, MD    Hand Dominance Right    Next MD Visit April 2022    Prior Therapy yes      Precautions   Precautions None                         [facility] Adult PT Treatment/Exercise - 02/06/21 0001      Lumbar Exercises: Aerobic   UBE (Upper Arm Bike) 120 RPM x8 min (forward/backward)  Nustep L4 x6min UE/LE activity      Lumbar Exercises: Seated   Long Arc Quad on Chair Strengthening;Right;2 sets;10 reps;Weights    LAQ on Chair Weights (lbs) 4    Other Seated Lumbar Exercises R HS curl green x20 reps    Other Seated Lumbar Exercises B hip clam green theraband x20 reps      Lumbar Exercises: Supine   Bridge 20 reps;3 seconds    Straight Leg Raise 20 reps    Straight Leg Raises Limitations RLE      Shoulder Exercises: Standing   Protraction Strengthening;Right;20 reps;Theraband    Theraband Level (Shoulder Protraction) Level 1 (Yellow)    External Rotation Strengthening;Right;20 reps;Theraband    Theraband Level (Shoulder External Rotation) Level 1 (Yellow)     Internal Rotation Strengthening;Right;20 reps;Theraband    Theraband Level (Shoulder Internal Rotation) Level 1 (Yellow)    Extension Strengthening;Right;20 reps;Theraband    Theraband Level (Shoulder Extension) Level 1 (Yellow)    Row Strengthening;Right;20 reps;Theraband    Theraband Level (Shoulder Row) Level 1 (Yellow)    Diagonals Strengthening;Right;20 reps;Theraband    Theraband Level (Shoulder Diagonals) Level 1 (Yellow)    Diagonals Limitations D2                    PT Short Term Goals - 01/29/21 1318      PT SHORT TERM GOAL #1   Title STG=LTG             PT Long Term Goals - 02/01/21 0846      PT LONG TERM GOAL #1   Title Patient will be independent with HEP    Time 6    Period Weeks    Status On-going      PT LONG TERM GOAL #2   Title Patient will demonstrate 4/5 or greater right LE and UE MMT in all planes to improve stability during functional tasks.    Time 6    Period Weeks    Status On-going      PT LONG TERM GOAL #3   Title Patient will demosntrate 50+ lb of R grip strength to improve fine motor tasks.    Time 6    Period Weeks    Status On-going      PT LONG TERM GOAL #4   Title Patient will demonstrate proper lifting and squat techniques for work activities and no reports of R LE shooting pain.    Time 6    Period Weeks    Status On-going                 Plan - 02/06/21 0916    Clinical Impression Statement Patient presented in clinic with reports of weakness of RUE and RLE. Patient guided through lightly resisted strengthening with no complaints of any increased pain or LE pain. Patient encouraged to continue HEP as provided previously but that he could use putty at any time for as much as he would like.    Personal Factors and Comorbidities Age;Comorbidity 1    Comorbidities history of CABG x3    Examination-Activity Limitations Carry;Locomotion Level;Sleep;Squat;Stairs;Stand;Reach Overhead    Examination-Participation  Restrictions Other    Stability/Clinical Decision Making Evolving/Moderate complexity    Rehab Potential Fair    PT Frequency 2x / week    PT Duration 6 weeks    PT Treatment/Interventions ADLs/Self Care Home Management;Moist Heat;Electrical Stimulation;Ultrasound;Gait training;Stair training;Iontophoresis 4mg /ml Dexamethasone;Functional mobility training;Therapeutic activities;Therapeutic exercise;Balance training;Neuromuscular re-education;Manual techniques;Passive range of motion;Patient/family education  PT Next Visit Plan nustep, UBE, LE & UE strengthening, postural exercises and stretching, R grip strengthening, modalities PRN for pain relief    PT Home Exercise Plan see patient education section    Consulted and Agree with Plan of Care Patient           Patient will benefit from skilled therapeutic intervention in order to improve the following deficits and impairments:  Abnormal gait,Decreased range of motion,Difficulty walking,Decreased balance,Decreased activity tolerance,Decreased strength,Postural dysfunction,Pain,Impaired UE functional use  Visit Diagnosis: Radiculopathy, cervical region  Radiculopathy, lumbar region  Abnormal posture  Muscle weakness (generalized)     Problem List Patient Active Problem List   Diagnosis Date Noted  . Tremor of right hand 05/02/2020  . Uncontrolled REM sleep behavior disorder 05/02/2020  . Hemiparesis of right dominant side ([place]) 05/02/2020  . Chest pain 06/22/2014  . Essential hypertension 06/22/2014  . Belching 06/22/2014  . Local skin infection 03/16/2013  . Arteriosclerotic cardiovascular disease (ASCVD) 06/26/2012  . Hyperlipidemia     Standley Brooking, PTA 02/06/2021, 9:23 AM  [facility] [address] [place], [place], [address] Phone: [phone]   Fax:  [phone]  Name: DIONDRE PULIS MRN: [id number] Date of Birth: Mar 05, 1966

## 2021-02-09 ENCOUNTER — Encounter: Payer: Self-pay | Admitting: Physical Therapy

## 2021-02-09 ENCOUNTER — Ambulatory Visit: Payer: Self-pay | Admitting: Physical Therapy

## 2021-02-09 ENCOUNTER — Telehealth: Payer: Self-pay

## 2021-02-09 ENCOUNTER — Other Ambulatory Visit: Payer: Self-pay

## 2021-02-09 DIAGNOSIS — M5412 Radiculopathy, cervical region: Secondary | ICD-10-CM

## 2021-02-09 NOTE — Telephone Encounter (Signed)
Client of Care connect had called earlier regarding assistance with his outpatient physical therapy with [facility] in [place].  Reviewed cone financial application with Johnnette Barrios and it shows client is approved for 75% assistance until 03/25/21 per Diane.  Contacted client and gave him this information, client states he does not have his approval letter, gave client customer service number for [facility] to call and ask for a letter to be resent. Client reports understanding.   Debria Garret RN [employer]

## 2021-02-09 NOTE — Therapy (Signed)
[facility] [place], [place], [address] Phone: [phone]   Fax:  [phone]  Physical Therapy Treatment  Patient Details  Name: Edward Fisher MRN: [id number] Date of Birth: 10-22-1966 Referring Provider (PT): Edward Lynch, MD   Encounter Date: 02/09/2021   PT End of Session - 02/09/21 1033    Visit Number 4    Number of Visits 12    Date for PT Re-Evaluation 03/19/21    Authorization Type Self-pay; Progress note very 10th visit    PT Start Time 0815    PT Stop Time 0906    PT Time Calculation (min) 51 min    Activity Tolerance Patient tolerated treatment well    Behavior During Therapy [facility] for tasks assessed/performed           Past Medical History:  Diagnosis Date  . Arteriosclerotic cardiovascular disease (ASCVD)    Onset in 04/2012; CABG in 06/2012 w/ LIMA-LAD, L Rad-CFX, SVG-PL-PDA  . Hyperlipidemia    goal LDL < 70  . Insomnia   . Nephrolithiasis     Past Surgical History:  Procedure Laterality Date  . CORONARY ARTERY BYPASS GRAFT  06/24/2012   Procedure: CORONARY ARTERY BYPASS GRAFTING (CABG);  Surgeon: [name], MD;  Location: [place];  Service: Open Heart Surgery;  Laterality: N/A;  Coronary Artery  Bypass X 4 utilizing endoscopic saphenous vein graft and  Left radial Artery Harvest   . CYSTOSCOPY W/ RETROGRADES Left 08/23/2015   Procedure: CYSTOSCOPY WITH RETROGRADE PYELOGRAM;  Surgeon: [name], MD;  [facility];  Service: Urology;  Laterality: Left;  . CYSTOSCOPY WITH URETEROSCOPY, STONE BASKETRY AND STENT PLACEMENT Left 08/23/2015   Procedure: CYSTOSCOPY WITH URETEROSCOPY, STONE BASKET EXTRACTION;  Surgeon: [name], MD;  Location[facility];  Service: Urology;  Laterality: Left;  . HOLMIUM LASER APPLICATION Left 02/54/2706   Procedure: HOLMIUM LASER APPLICATION;  Surgeon: [name], MD;  [facility];  Service: Urology;  Laterality: Left;  . LEFT HEART  CATHETERIZATION WITH CORONARY ANGIOGRAM N/A 06/23/2012   Procedure: LEFT HEART CATHETERIZATION WITH CORONARY ANGIOGRAM;  Surgeon: [name], MD;  Location: [facility] CATH LAB;  Service: Cardiovascular;  Laterality: N/A;  . RHINOPLASTY    . TONSILLECTOMY  ~1971    There were no vitals filed for this visit.   Subjective Assessment - 02/09/21 0934    Subjective COVID-19 screen performed prior to patient entering clinic.  No new complaints.    Pertinent History history of CABG x3    Limitations Sitting;Lifting;Standing;Walking;House hold activities    Diagnostic tests x-ray: arthritis in spine; MRI cleared of stroke    Patient Stated Goals play musical instruments                             [facility] Adult PT Treatment/Exercise - 02/09/21 0001      Exercises   Exercises Knee/Hip      Lumbar Exercises: Aerobic   UBE (Upper Arm Bike) 120 RPM's x 8 minutes.    Nustep Level 3 x 11 minutes.      Modalities   Modalities Ultrasound      Ultrasound   Ultrasound Location RT cervical.    Ultrasound Parameters Combo e'stim/US at 1.50 W/CM2 x 8 minutes.      Manual Therapy   Manual Therapy Soft tissue mobilization    Soft tissue mobilization STW/M and trigger point release to patient's right cervical/UT x  11 minutes.                    PT Short Term Goals - 01/29/21 1318      PT SHORT TERM GOAL #1   Title STG=LTG             PT Long Term Goals - 02/01/21 0846      PT LONG TERM GOAL #1   Title Patient will be independent with HEP    Time 6    Period Weeks    Status On-going      PT LONG TERM GOAL #2   Title Patient will demonstrate 4/5 or greater right LE and UE MMT in all planes to improve stability during functional tasks.    Time 6    Period Weeks    Status On-going      PT LONG TERM GOAL #3   Title Patient will demosntrate 50+ lb of R grip strength to improve fine motor tasks.    Time 6    Period Weeks    Status On-going      PT LONG TERM  GOAL #4   Title Patient will demonstrate proper lifting and squat techniques for work activities and no reports of R LE shooting pain.    Time 6    Period Weeks    Status On-going                 Plan - 02/09/21 1020    Clinical Impression Statement The patient did very well today.  He had a trigger point in his right UT that responded very well to treatment today. Performed Cervical distraction test and he felt better.    Personal Factors and Comorbidities Age;Comorbidity 1    Comorbidities history of CABG x3    Examination-Activity Limitations Carry;Locomotion Level;Sleep;Squat;Stairs;Stand;Reach Overhead    Examination-Participation Restrictions Other    Stability/Clinical Decision Making Evolving/Moderate complexity    Rehab Potential Fair    PT Frequency 2x / week    PT Duration 6 weeks    PT Treatment/Interventions ADLs/Self Care Home Management;Moist Heat;Electrical Stimulation;Ultrasound;Gait training;Stair training;Iontophoresis 4mg /ml Dexamethasone;Functional mobility training;Therapeutic activities;Therapeutic exercise;Balance training;Neuromuscular re-education;Manual techniques;Passive range of motion;Patient/family education;Traction    PT Next Visit Plan Consider trying intermittment cervical traction at 12# next visit.    PT Home Exercise Plan see patient education section    Consulted and Agree with Plan of Care Patient           Patient will benefit from skilled therapeutic intervention in order to improve the following deficits and impairments:  Abnormal gait,Decreased range of motion,Difficulty walking,Decreased balance,Decreased activity tolerance,Decreased strength,Postural dysfunction,Pain,Impaired UE functional use  Visit Diagnosis: Radiculopathy, cervical region - Plan: PT plan of care cert/re-cert     Problem List Patient Active Problem List   Diagnosis Date Noted  . Tremor of right hand 05/02/2020  . Uncontrolled REM sleep behavior disorder  05/02/2020  . Hemiparesis of right dominant side ([place]) 05/02/2020  . Chest pain 06/22/2014  . Essential hypertension 06/22/2014  . Belching 06/22/2014  . Local skin infection 03/16/2013  . Arteriosclerotic cardiovascular disease (ASCVD) 06/26/2012  . Hyperlipidemia     [name], [name] 02/09/2021, 11:01 AM  [facility] [place], [place], [address] Phone: [phone]   Fax:  [phone]  Name: Edward Fisher MRN: [id number] Date of Birth: 1966/01/15

## 2021-02-14 ENCOUNTER — Ambulatory Visit: Payer: Self-pay | Admitting: Physical Therapy

## 2021-02-14 ENCOUNTER — Other Ambulatory Visit: Payer: Self-pay

## 2021-02-14 ENCOUNTER — Encounter: Payer: Self-pay | Admitting: Physical Therapy

## 2021-02-14 DIAGNOSIS — M6281 Muscle weakness (generalized): Secondary | ICD-10-CM

## 2021-02-14 DIAGNOSIS — M5412 Radiculopathy, cervical region: Secondary | ICD-10-CM

## 2021-02-14 DIAGNOSIS — M5416 Radiculopathy, lumbar region: Secondary | ICD-10-CM

## 2021-02-14 DIAGNOSIS — R293 Abnormal posture: Secondary | ICD-10-CM

## 2021-02-14 NOTE — Patient Instructions (Signed)
Access Code: [email or web address] URL: [email or web address] Date: 02/14/2021 Prepared by: Almyra Free  Exercises Bridge - 1 x daily - 7 x weekly - 3 sets - 10 reps Supine Hip Abduction on Slider - 1 x daily - 7 x weekly - 3 sets - 10 reps Hip Extension with Resistance Loop - 1 x daily - 7 x weekly - 3 sets - 10 reps Standing Hip Flexion with Resistance Loop - 1 x daily - 7 x weekly - 3 sets - 10 reps Standing Hamstring Curl with Resistance - 1 x daily - 7 x weekly - 3 sets - 10 reps Sitting Knee Extension with Resistance - 1 x daily - 7 x weekly - 3 sets - 10 reps Sit to Stand - 1 x daily - 7 x weekly - 3 sets - 10 reps Shoulder External Rotation and Scapular Retraction with Resistance - 2 x daily - 7 x weekly - 3 sets - 10 reps Scapular Retraction with Resistance - 2 x daily - 7 x weekly - 3 sets - 10 reps Standing Shoulder Horizontal Abduction with Resistance - 1 x daily - 7 x weekly - 3 sets - 10 reps

## 2021-02-14 NOTE — Therapy (Signed)
[facility] [place], [place], [address] Phone: [phone]   Fax:  [phone]  Physical Therapy Treatment  Patient Details  Name: Edward Fisher MRN: [id number] Date of Birth: 24-Aug-1966 Referring Provider (PT): Edmonia Lynch, MD   Encounter Date: 02/14/2021   PT End of Session - 02/14/21 0815    Visit Number 5    Number of Visits 12    Date for PT Re-Evaluation 03/19/21    Authorization Type Self-pay; Progress note very 10th visit    PT Start Time 0815    PT Stop Time 0859    PT Time Calculation (min) 44 min    Activity Tolerance Patient tolerated treatment well    Behavior During Therapy [facility] for tasks assessed/performed           Past Medical History:  Diagnosis Date  . Arteriosclerotic cardiovascular disease (ASCVD)    Onset in 04/2012; CABG in 06/2012 w/ LIMA-LAD, L Rad-CFX, SVG-PL-PDA  . Hyperlipidemia    goal LDL < 70  . Insomnia   . Nephrolithiasis     Past Surgical History:  Procedure Laterality Date  . CORONARY ARTERY BYPASS GRAFT  06/24/2012   Procedure: CORONARY ARTERY BYPASS GRAFTING (CABG);  Surgeon: [name], MD;  Location: [place];  Service: Open Heart Surgery;  Laterality: N/A;  Coronary Artery  Bypass X 4 utilizing endoscopic saphenous vein graft and  Left radial Artery Harvest   . CYSTOSCOPY W/ RETROGRADES Left 08/23/2015   Procedure: CYSTOSCOPY WITH RETROGRADE PYELOGRAM;  Surgeon: [name], MD;  [facility];  Service: Urology;  Laterality: Left;  . CYSTOSCOPY WITH URETEROSCOPY, STONE BASKETRY AND STENT PLACEMENT Left 08/23/2015   Procedure: CYSTOSCOPY WITH URETEROSCOPY, STONE BASKET EXTRACTION;  Surgeon: [name], MD;  [facility];  Service: Urology;  Laterality: Left;  . HOLMIUM LASER APPLICATION Left 03/50/0938   Procedure: HOLMIUM LASER APPLICATION;  Surgeon: [name], MD;  [facility];  Service: Urology;  Laterality: Left;  . LEFT HEART  CATHETERIZATION WITH CORONARY ANGIOGRAM N/A 06/23/2012   Procedure: LEFT HEART CATHETERIZATION WITH CORONARY ANGIOGRAM;  Surgeon: [name], MD;  Location: [facility] CATH LAB;  Service: Cardiovascular;  Laterality: N/A;  . RHINOPLASTY    . TONSILLECTOMY  ~1971    There were no vitals filed for this visit.   Subjective Assessment - 02/14/21 0815    Subjective COVID-19 screen performed prior to patient entering clinic.    Pertinent History history of CABG x3    Limitations Sitting;Lifting;Standing;Walking;House hold activities    Diagnostic tests x-ray: arthritis in spine; MRI cleared of stroke    Patient Stated Goals play musical instruments              [facility] PT Assessment - 02/14/21 0001      Strength   Right Shoulder Flexion 4-/5    Right Shoulder ABduction 3+/5    Right Shoulder Internal Rotation 4+/5    Right Shoulder External Rotation 4+/5    Right Hip Flexion 4/5    Right Hip ABduction 3-/5                         [facility] Adult PT Treatment/Exercise - 02/14/21 0001      Lumbar Exercises: Aerobic   UBE (Upper Arm Bike) 120 RPM's x 8 minutes.      Knee/Hip Exercises: Standing   Knee Flexion Right;10 reps    Knee Flexion Limitations red  Hip Flexion 20 reps;Knee straight    Hip Flexion Limitations red    Abduction Limitations too difficult with red; stayed with supine    Hip Extension Right;20 reps;Knee straight    Extension Limitations red band      Knee/Hip Exercises: Seated   Long Arc Quad Right;20 reps    Long Arc Quad Limitations red    Sit to [employer] 10 reps      Knee/Hip Exercises: Supine   Bridges 20 reps    Straight Leg Raises 2 sets;5 sets;Right    Straight Leg Raises Limitations Small range      Shoulder Exercises: Standing   External Rotation Both;20 reps;Theraband    Theraband Level (Shoulder External Rotation) Level 2 (Red)    ABduction 15 reps    ABduction Limitations cues to lift to shoulder height    Row Both;20 reps;Theraband     Theraband Level (Shoulder Row) Level 2 (Red)                  PT Education - 02/14/21 0903    Education Details HEP progressed see code in plan    Person(s) Educated Patient    Methods Explanation;Demonstration;Handout    Comprehension Verbalized understanding;Returned demonstration            PT Short Term Goals - 01/29/21 1318      PT SHORT TERM GOAL #1   Title STG=LTG             PT Long Term Goals - 02/14/21 0817      PT LONG TERM GOAL #1   Title Patient will be independent with HEP    Status On-going      PT LONG TERM GOAL #2   Title Patient will demonstrate 4/5 or greater right LE and UE MMT in all planes to improve stability during functional tasks.    Baseline --    Status Partially Met                 Plan - 02/14/21 0904    Clinical Impression Statement Patient is progressing with strengthening in some areas. Still very weak in shoulder and hip ABD. He tolerated increased resistance and new TE well today.    PT Frequency 2x / week    PT Duration 6 weeks    PT Treatment/Interventions ADLs/Self Care Home Management;Moist Heat;Electrical Stimulation;Ultrasound;Gait training;Stair training;Iontophoresis 48m/ml Dexamethasone;Functional mobility training;Therapeutic activities;Therapeutic exercise;Balance training;Neuromuscular re-education;Manual techniques;Passive range of motion;Patient/family education;Traction    PT Next Visit Plan continue to progress TE and consider trying intermittment cervical traction at 12# next visit.    PT Home Exercise Plan [email or web address]    Consulted and Agree with Plan of Care Patient           Patient will benefit from skilled therapeutic intervention in order to improve the following deficits and impairments:  Abnormal gait,Decreased range of motion,Difficulty walking,Decreased balance,Decreased activity tolerance,Decreased strength,Postural dysfunction,Pain,Impaired UE functional use  Visit  Diagnosis: Radiculopathy, cervical region  Radiculopathy, lumbar region  Abnormal posture  Muscle weakness (generalized)     Problem List Patient Active Problem List   Diagnosis Date Noted  . Tremor of right hand 05/02/2020  . Uncontrolled REM sleep behavior disorder 05/02/2020  . Hemiparesis of right dominant side ([place] 05/02/2020  . Chest pain 06/22/2014  . Essential hypertension 06/22/2014  . Belching 06/22/2014  . Local skin infection 03/16/2013  . Arteriosclerotic cardiovascular disease (ASCVD) 06/26/2012  . Hyperlipidemia    JMadelyn FlavorsPT 02/14/2021, 9:10 AM  [facility] [place], [place], [address] Phone: [phone]   Fax:  [phone]  Name: EARLE TROIANO MRN: [id number] Date of Birth: 1966/08/24

## 2021-02-19 ENCOUNTER — Other Ambulatory Visit: Payer: Self-pay | Admitting: Orthopedic Surgery

## 2021-02-19 DIAGNOSIS — M542 Cervicalgia: Secondary | ICD-10-CM

## 2021-02-20 ENCOUNTER — Ambulatory Visit: Payer: Self-pay | Admitting: Physical Therapy

## 2021-02-23 ENCOUNTER — Other Ambulatory Visit: Payer: Self-pay

## 2021-02-23 ENCOUNTER — Ambulatory Visit: Payer: Self-pay | Admitting: Physical Therapy

## 2021-02-23 DIAGNOSIS — M5412 Radiculopathy, cervical region: Secondary | ICD-10-CM

## 2021-02-23 NOTE — Therapy (Signed)
[facility] [place], [place], [address] Phone: [phone]   Fax:  [phone]  Physical Therapy Treatment  Patient Details  Name: Edward Fisher MRN: [id number] Date of Birth: 1966/07/03 Referring Provider (PT): Edmonia Lynch, MD   Encounter Date: 02/23/2021   PT End of Session - 02/23/21 0951    Visit Number 6    Number of Visits 12    Date for PT Re-Evaluation 03/19/21    Authorization Type Self-pay; Progress note very 10th visit    PT Start Time 0815    PT Stop Time 0909    PT Time Calculation (min) 54 min    Activity Tolerance Patient tolerated treatment well    Behavior During Therapy [facility] for tasks assessed/performed           Past Medical History:  Diagnosis Date  . Arteriosclerotic cardiovascular disease (ASCVD)    Onset in 04/2012; CABG in 06/2012 w/ LIMA-LAD, L Rad-CFX, SVG-PL-PDA  . Hyperlipidemia    goal LDL < 70  . Insomnia   . Nephrolithiasis     Past Surgical History:  Procedure Laterality Date  . CORONARY ARTERY BYPASS GRAFT  06/24/2012   Procedure: CORONARY ARTERY BYPASS GRAFTING (CABG);  Surgeon: [name], MD;  Location: [facility];  Service: Open Heart Surgery;  Laterality: N/A;  Coronary Artery  Bypass X 4 utilizing endoscopic saphenous vein graft and  Left radial Artery Harvest   . CYSTOSCOPY W/ RETROGRADES Left 08/23/2015   Procedure: CYSTOSCOPY WITH RETROGRADE PYELOGRAM;  Surgeon: [name], MD;  Location: [facility];  Service: Urology;  Laterality: Left;  . CYSTOSCOPY WITH URETEROSCOPY, STONE BASKETRY AND STENT PLACEMENT Left 08/23/2015   Procedure: CYSTOSCOPY WITH URETEROSCOPY, STONE BASKET EXTRACTION;  Surgeon: [name], MD;  Location: [facility];  Service: Urology;  Laterality: Left;  . HOLMIUM LASER APPLICATION Left 42/59/5638   Procedure: HOLMIUM LASER APPLICATION;  Surgeon: [name], MD;  Location: [facility];  Service: Urology;  Laterality: Left;  . LEFT HEART  CATHETERIZATION WITH CORONARY ANGIOGRAM N/A 06/23/2012   Procedure: LEFT HEART CATHETERIZATION WITH CORONARY ANGIOGRAM;  Surgeon: [name], MD;  Location: [facility] CATH LAB;  Service: Cardiovascular;  Laterality: N/A;  . RHINOPLASTY    . TONSILLECTOMY  ~1971    There were no vitals filed for this visit.   Subjective Assessment - 02/23/21 0941    Subjective COVID-19 screen performed prior to patient entering clinic.  CC is right UE symptoms.    Pertinent History history of CABG x3    Limitations Sitting;Lifting;Standing;Walking;House hold activities    Diagnostic tests x-ray: arthritis in spine; MRI cleared of stroke    Patient Stated Goals play musical instruments                             [facility] Adult PT Treatment/Exercise - 02/23/21 0001      Exercises   Exercises Shoulder      Lumbar Exercises: Aerobic   UBE (Upper Arm Bike) 120 RPM'x 8 minutes.      Modalities   Modalities Electrical Stimulation;Moist Heat      Moist Heat Therapy   Number Minutes Moist Heat 20 Minutes      Electrical Stimulation   Electrical Stimulation Location RT cervical.    Electrical Stimulation Action IFC at 80-150 Hz.    Electrical Stimulation Parameters 40% scan x 20 minutes.    Electrical Stimulation Goals Pain;Tone  Manual Therapy   Manual Therapy Soft tissue mobilization    Soft tissue mobilization In supine:  STW/M to patient's right cervical region and gentle manual traction x 15 minutes.                    PT Short Term Goals - 01/29/21 1318      PT SHORT TERM GOAL #1   Title STG=LTG             PT Long Term Goals - 02/14/21 0817      PT LONG TERM GOAL #1   Title Patient will be independent with HEP    Status On-going      PT LONG TERM GOAL #2   Title Patient will demonstrate 4/5 or greater right LE and UE MMT in all planes to improve stability during functional tasks.    Baseline --    Status Partially Met                 Plan  - 02/23/21 0947    Clinical Impression Statement Patient did very well with treatment today and stated the manual traction felt good.    Personal Factors and Comorbidities Age;Comorbidity 1    Comorbidities history of CABG x3    Examination-Activity Limitations Carry;Locomotion Level;Sleep;Squat;Stairs;Stand;Reach Overhead    Examination-Participation Restrictions Other    Stability/Clinical Decision Making Evolving/Moderate complexity    Rehab Potential Fair    PT Frequency 2x / week    PT Duration 6 weeks    PT Treatment/Interventions ADLs/Self Care Home Management;Moist Heat;Electrical Stimulation;Ultrasound;Gait training;Stair training;Iontophoresis 3m/ml Dexamethasone;Functional mobility training;Therapeutic activities;Therapeutic exercise;Balance training;Neuromuscular re-education;Manual techniques;Passive range of motion;Patient/family education;Traction    PT Next Visit Plan continue to progress TE and consider trying intermittment cervical traction at 12# next visit.    Consulted and Agree with Plan of Care Patient           Patient will benefit from skilled therapeutic intervention in order to improve the following deficits and impairments:  Abnormal gait,Decreased range of motion,Difficulty walking,Decreased balance,Decreased activity tolerance,Decreased strength,Postural dysfunction,Pain,Impaired UE functional use  Visit Diagnosis: Radiculopathy, cervical region     Problem List Patient Active Problem List   Diagnosis Date Noted  . Tremor of right hand 05/02/2020  . Uncontrolled REM sleep behavior disorder 05/02/2020  . Hemiparesis of right dominant side ([place] 05/02/2020  . Chest pain 06/22/2014  . Essential hypertension 06/22/2014  . Belching 06/22/2014  . Local skin infection 03/16/2013  . Arteriosclerotic cardiovascular disease (ASCVD) 06/26/2012  . Hyperlipidemia     Anneli Bing, CMaliMPT 02/23/2021, 9:52 AM  [facility] [address]Phone: [phone]  Fax:  [phone] Name: Edward PATLANMRN: [id number] of Birth: 5October 01, 1967

## 2021-02-27 ENCOUNTER — Ambulatory Visit: Payer: Self-pay | Admitting: Physician Assistant

## 2021-02-27 DIAGNOSIS — I10 Essential (primary) hypertension: Secondary | ICD-10-CM

## 2021-02-27 DIAGNOSIS — I2510 Atherosclerotic heart disease of native coronary artery without angina pectoris: Secondary | ICD-10-CM

## 2021-02-27 DIAGNOSIS — R7989 Other specified abnormal findings of blood chemistry: Secondary | ICD-10-CM

## 2021-02-27 DIAGNOSIS — E785 Hyperlipidemia, unspecified: Secondary | ICD-10-CM

## 2021-02-27 NOTE — Progress Notes (Signed)
There were no vitals taken for this visit.   Subjective:    Patient ID: Edward Fisher, male    DOB: [birth year], 55 y.o.   MRN: [id number]  HPI: Edward Fisher is a 55 y.o. male presenting on 02/27/2021 for No chief complaint on file.   HPI   This is a telemedicine appointment due to coronavirus pandemic.  It is via telephone as pt does not have a video enabled device.   I connected with  Edward Fisher on 02/27/21 by a video enabled telemedicine application and verified that I am speaking with the correct person using two identifiers.   I discussed the limitations of evaluation and management by telemedicine. The patient expressed understanding and agreed to proceed.  Pt is at work.  Provider is at office.    Pt is 60yoM with CAD s/p cabg.  He had follow up with Cardiology in march.  The cardiologist increased his statin but pt didn't get the medication.  He continues to take the 20mg  atorvastatin.   He is not having any CP or sob.   Pt says flomax didn't seem to help.  He says his nocturia hasn't been as bad recently.  he says he Doesn't drink caffeine.  His wife passed away from covid (she was liver transplant recipient who was not vaccinated) within the past year.  Pt says his Mood is okay some times and at other times pretty low.  he has No SI, HI.  He has a supportive family and spends a lot of time with them.  Pt has still not received the covid vaccination.  Pt has been Seen by orthopedics.  He is Getting PT.  He says he is Scheduled for MRI this weekend.  He is having problems with his shoulder.  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       Relevant past medical, surgical, family and social history reviewed and updated as indicated. Interim medical history since our last visit reviewed. Allergies and medications reviewed and updated.     Current Outpatient Medications:  .  aspirin 81 MG chewable tablet, Chew 81 mg by mouth  every evening. TO START DOSAGE IN 6 MONTHS APPROXIMATELY July 2014, Disp: , Rfl:  .  atorvastatin (LIPITOR) 20 MG tablet, Take 20 mg by mouth daily., Disp: , Rfl:  .  metoprolol succinate (TOPROL-XL) 25 MG 24 hr tablet, Take 1 tablet by mouth once daily, Disp: 90 tablet, Rfl: 2 .  omega-3 acid ethyl esters (LOVAZA) 1 G capsule, Take 1 g by mouth every evening., Disp: , Rfl:  .  atorvastatin (LIPITOR) 40 MG tablet, Take 1 tablet (40 mg total) by mouth daily. (Patient not taking: Reported on 02/27/2021), Disp: 90 tablet, Rfl: 3 .  nitroGLYCERIN (NITROSTAT) 0.4 MG SL tablet, Place 1 tablet (0.4 mg total) under the tongue every 5 (five) minutes as needed for chest pain. (Patient not taking: Reported on 02/27/2021), Disp: 25 tablet, Rfl: 3 .  tamsulosin (FLOMAX) 0.4 MG CAPS capsule, Take 1 capsule (0.4 mg total) by mouth daily. (Patient not taking: Reported on 02/27/2021), Disp: 30 capsule, Rfl: 4    .   Review of Systems  Per HPI unless specifically indicated above     Objective:    There were no vitals taken for this visit.  Wt Readings from Last 3 Encounters:  01/19/21 211 lb (95.7 kg)  10/02/20 223 lb 8 oz (101.4 kg)  09/25/20 222 lb (100.7 kg)  Physical Exam Pulmonary:     Effort: No respiratory distress.     Comments: Pt is talking in complete sentences without sob. Neurological:     Mental Status: He is alert and oriented to person, place, and time.  Psychiatric:        Attention and Perception: Attention normal.        Speech: Speech normal.        Behavior: Behavior is cooperative.            Assessment & Plan:    Encounter Diagnoses  Name Primary?  Edward Fisher Arteriosclerotic cardiovascular disease (ASCVD) Yes  . Hyperlipidemia, unspecified hyperlipidemia type   . Essential hypertension   . Elevated LFTs     -pt is encouraged to Limit caffeine and also Limit fluids after dinner to reduce nocturia -Lipid recheck in june as per cards.  Pt was counseled on reasons for  increase in the statin for his CAD. -Encouraged pt to get covid vaccination -pt to follow up 3 months.  He is to contact office sooner prn

## 2021-02-28 ENCOUNTER — Encounter: Payer: Self-pay | Admitting: Physician Assistant

## 2021-03-02 ENCOUNTER — Ambulatory Visit: Payer: Self-pay | Admitting: Physical Therapy

## 2021-03-04 ENCOUNTER — Other Ambulatory Visit: Payer: Self-pay

## 2021-03-04 ENCOUNTER — Ambulatory Visit
Admission: RE | Admit: 2021-03-04 | Discharge: 2021-03-04 | Disposition: A | Discharge status: Home / Self Care | Payer: No Typology Code available for payment source | Source: Ambulatory Visit | Attending: Orthopedic Surgery | Admitting: Orthopedic Surgery

## 2021-03-04 DIAGNOSIS — M542 Cervicalgia: Secondary | ICD-10-CM

## 2021-03-04 IMAGING — MR MR CERVICAL SPINE W/O CM
4 of 5 series · 26 of 48 positions shown · non-contrast
Comparison: None available.

CLINICAL DATA: Initial evaluation for right upper and lower
extremity pain with decreased strength and control for 1 year.

EXAM:
MRI CERVICAL SPINE WITHOUT CONTRAST
TECHNIQUE: Multiplanar, multisequence MR imaging of the cervical spine was
performed. No intravenous contrast was administered.

[Series 5: T2 · sagittal · 3.0mm · 0.55mm/px · 6 of 15 slices shown (1 of 2)]
[im 1/15]
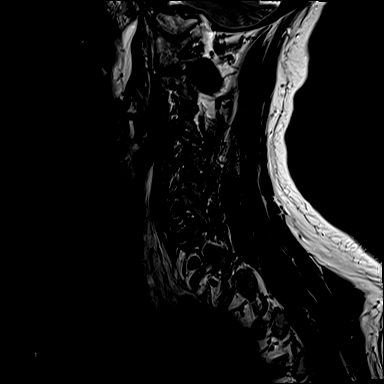
[im 3/15]
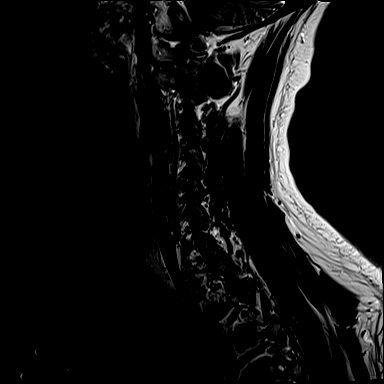
[im 6/15]
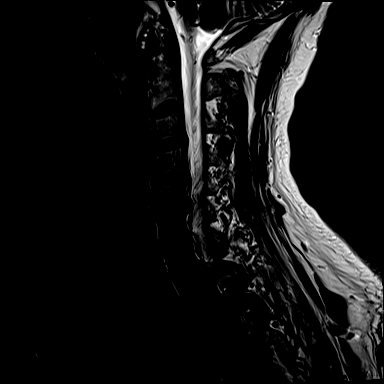
[im 9/15]
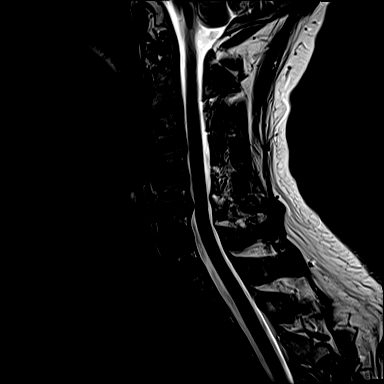
[im 12/15]
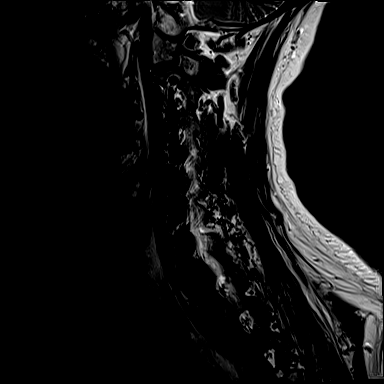
[im 15/15]
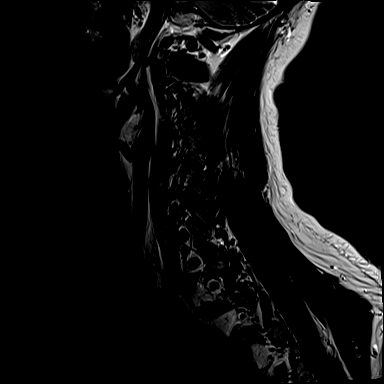

[Series 6: T1 · sagittal · 3.0mm · 0.66mm/px · 6 of 15 slices shown]
[im 1/15]
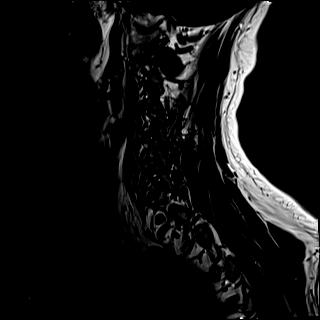
[im 3/15]
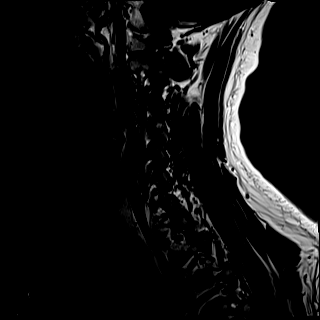
[im 6/15]
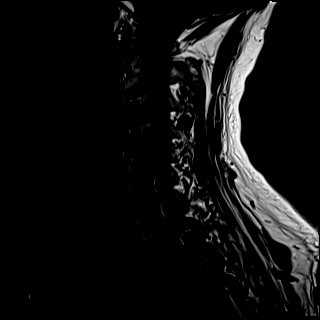
[im 9/15]
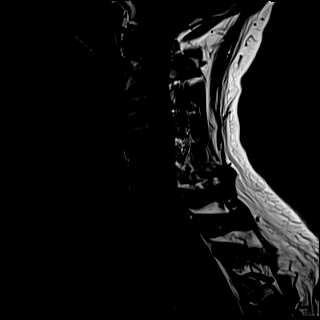
[im 12/15]
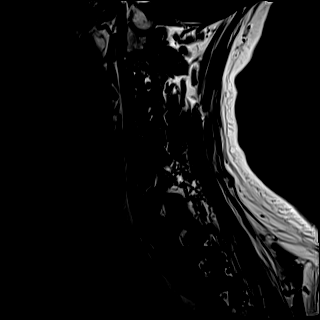
[im 15/15]
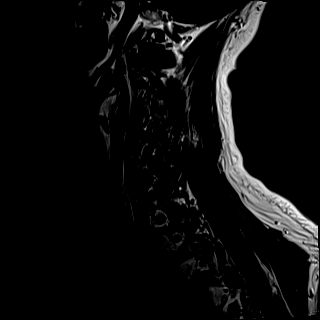

[Series 7: STIR · sagittal · 3.0mm · 0.33mm/px · 5 of 15 slices shown]
[im 1/15]
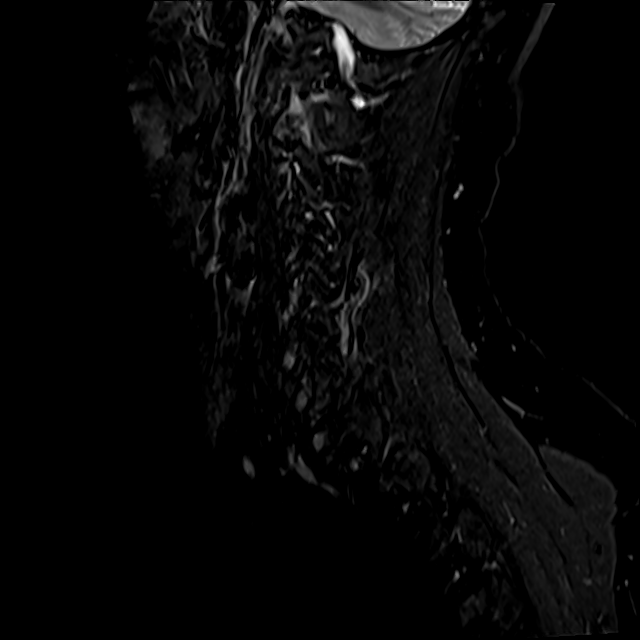
[im 3/15]
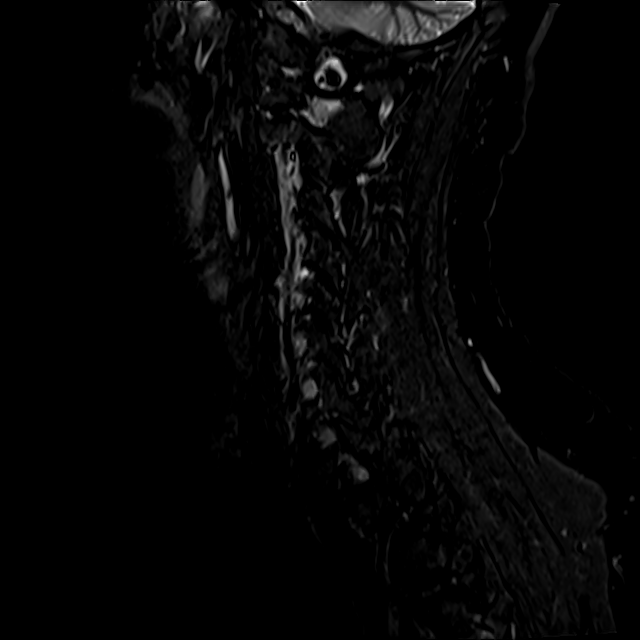
[im 6/15]
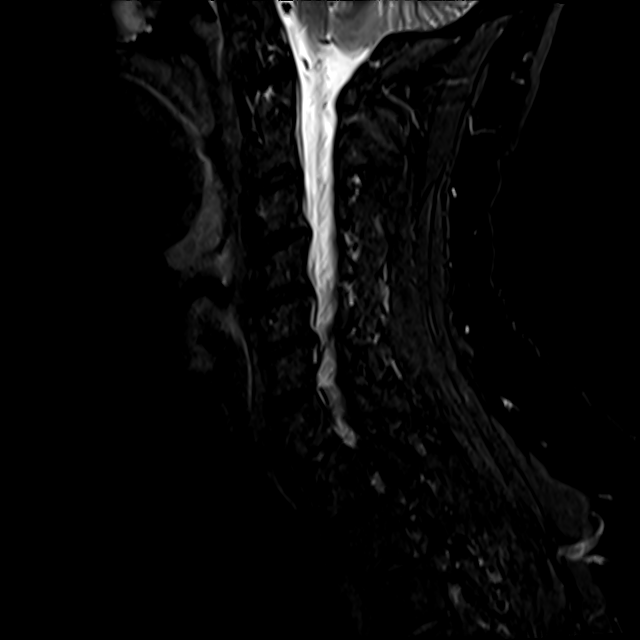
[im 9/15]
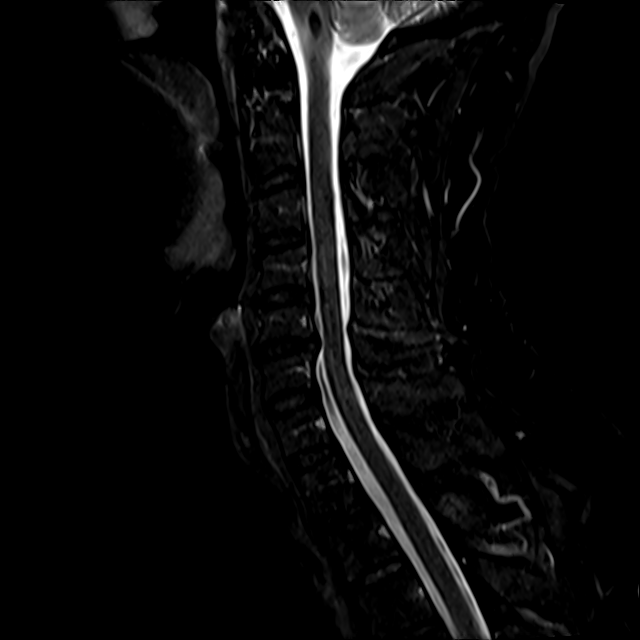
[im 15/15]
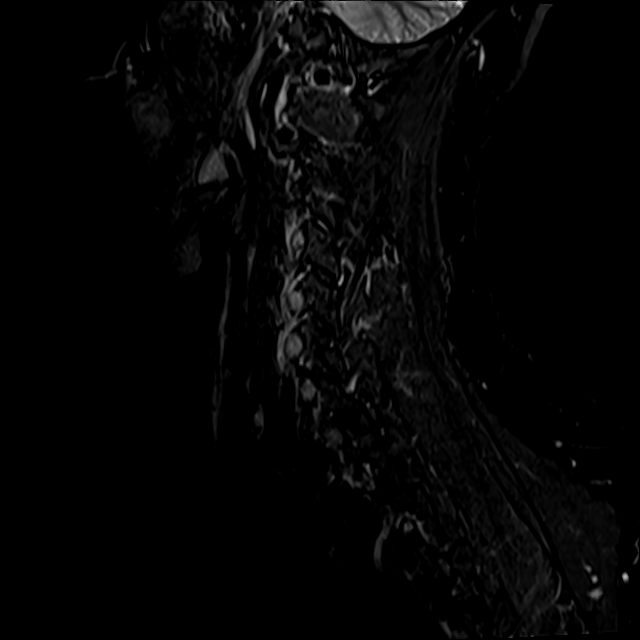

[Series 8: T2 · axial · 3.0mm · 0.53mm/px · z∈[-87,+36]mm · 9 of 39 slices shown (2 of 2)]
[im 1/39]
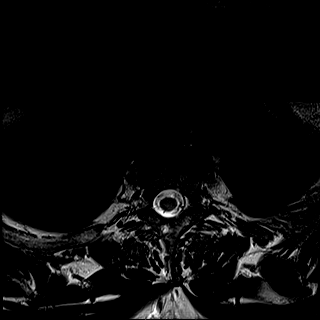
[im 6/39]
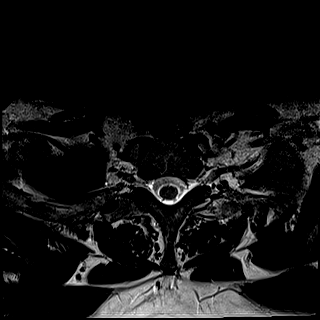
[im 11/39]
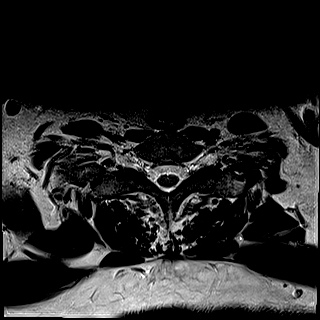
[im 17/39]
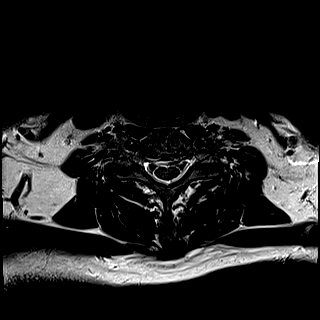
[im 20/39]
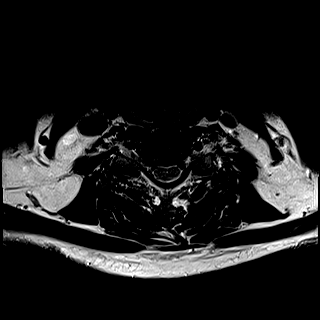
[im 22/39]
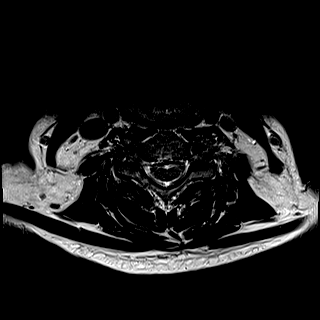
[im 28/39]
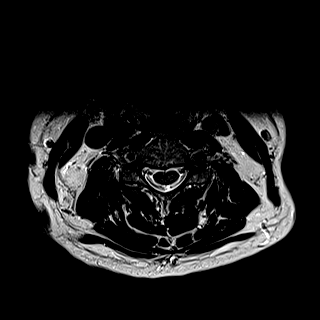
[im 33/39]
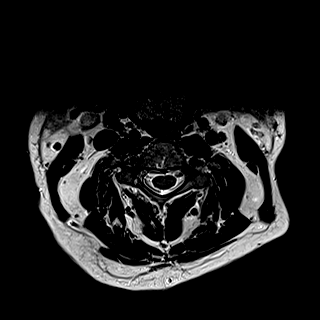
[im 39/39]
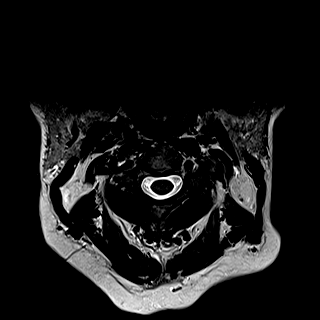

[26 of 48 positions shown; findings below may reference images not displayed]

FINDINGS: Alignment: Straightening of the normal cervical lordosis. No
listhesis.

Vertebrae: Vertebral body height well maintained without acute or
chronic fracture. Bone marrow signal intensity within normal limits.
No discrete or worrisome osseous lesions. No abnormal marrow edema.

Cord: Normal signal and morphology.

Posterior Fossa, vertebral arteries, paraspinal tissues: Visualized
brain and posterior fossa within normal limits. Craniocervical
junction normal. Paraspinous and prevertebral soft tissues within
normal limits. Normal intravascular flow voids seen within the
vertebral arteries bilaterally.

Disc levels:

C2-C3: Shallow right paracentral disc protrusion mildly indents the
ventral thecal sac (series 8, image 4). Associated annular fissure.
No significant spinal stenosis or cord deformity. Foramina remain
patent.

C3-C4: Mild disc bulge with uncovertebral spurring. Superimposed
tiny central disc protrusion mildly indents the ventral thecal sac,
slightly eccentric to the right (series 9, image 11). No significant
spinal stenosis or cord deformity. Mild right C4 foraminal stenosis.
Left neural foramina remains patent.

C4-C5: Right eccentric disc bulge with bilateral uncovertebral
hypertrophy. Superimposed subtle right paracentral disc protrusion
minimally indents the right ventral thecal sac (series 8, image 16).
No significant spinal stenosis or cord deformity. Mild to moderate
right C5 foraminal stenosis. Left neural foramina remains patent.

C5-C6: Broad-based posterior disc osteophyte complex flattens and
partially effaces the ventral thecal sac, asymmetric to the right
(series 9, image 20). Mild spinal stenosis with minimal flattening
of the ventral cord. Moderate right with mild to moderate left C6
foraminal narrowing.

C6-C7:  Unremarkable.

C7-T1:  Unremarkable.

Visualized upper thoracic spine demonstrates no significant finding.
IMPRESSION: 1. Broad-based posterior disc osteophyte complex at C5-6 with
resultant mild spinal stenosis, with moderate right and mild to
moderate left C6 foraminal narrowing.
2. Right eccentric disc bulge with uncovertebral disease at C4-5
with resultant mild to moderate right C5 foraminal stenosis.
3. Small right paracentral disc protrusions at C2-3 and C3-4 without
significant stenosis or cord deformity.

## 2021-03-07 ENCOUNTER — Other Ambulatory Visit: Payer: Self-pay | Admitting: Physical Medicine and Rehabilitation

## 2021-03-07 DIAGNOSIS — R29898 Other symptoms and signs involving the musculoskeletal system: Secondary | ICD-10-CM

## 2021-03-07 DIAGNOSIS — M545 Low back pain, unspecified: Secondary | ICD-10-CM

## 2021-03-24 ENCOUNTER — Inpatient Hospital Stay: Admission: RE | Admit: 2021-03-24 | Payer: No Typology Code available for payment source | Source: Ambulatory Visit

## 2021-04-03 ENCOUNTER — Other Ambulatory Visit: Payer: Self-pay

## 2021-04-03 ENCOUNTER — Ambulatory Visit
Admission: RE | Admit: 2021-04-03 | Discharge: 2021-04-03 | Disposition: A | Discharge status: Home / Self Care | Payer: No Typology Code available for payment source | Source: Ambulatory Visit | Attending: Physical Medicine and Rehabilitation | Admitting: Physical Medicine and Rehabilitation

## 2021-04-03 DIAGNOSIS — M545 Low back pain, unspecified: Secondary | ICD-10-CM

## 2021-04-03 DIAGNOSIS — R29898 Other symptoms and signs involving the musculoskeletal system: Secondary | ICD-10-CM

## 2021-04-03 IMAGING — MR MR LUMBAR SPINE W/O CM
4 of 5 series · 23 of 48 positions shown · non-contrast
Comparison: CT abdomen/pelvis [DATE]

CLINICAL DATA: Low back pain, unspecified back pain laterality,
unspecified chronicity, unspecified whether sciatica present.
Weakness of right lower extremity. Additional history provided by
scanning technologist: Patient reports right-sided low back pain and
right leg weakness for 1 year.

EXAM:
MRI LUMBAR SPINE WITHOUT CONTRAST
TECHNIQUE: Multiplanar, multisequence MR imaging of the lumbar spine was
performed. No intravenous contrast was administered.

[Series 3: T2 · sagittal · 4.0mm · 0.55mm/px · 6 of 16 slices shown (1 of 2)]
[im 1/16]
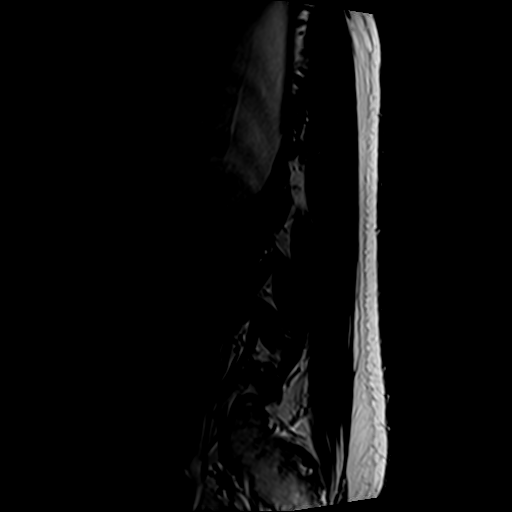
[im 4/16]
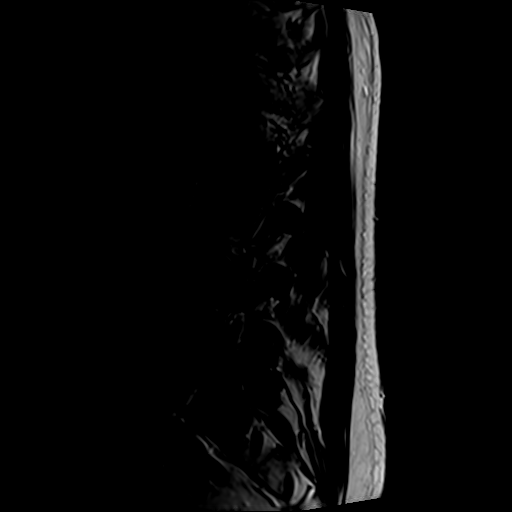
[im 7/16]
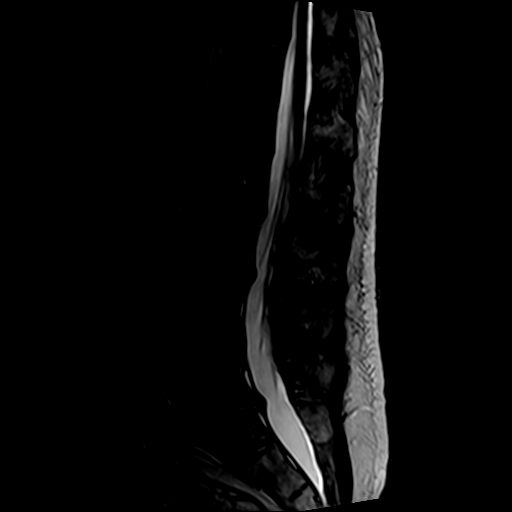
[im 10/16]
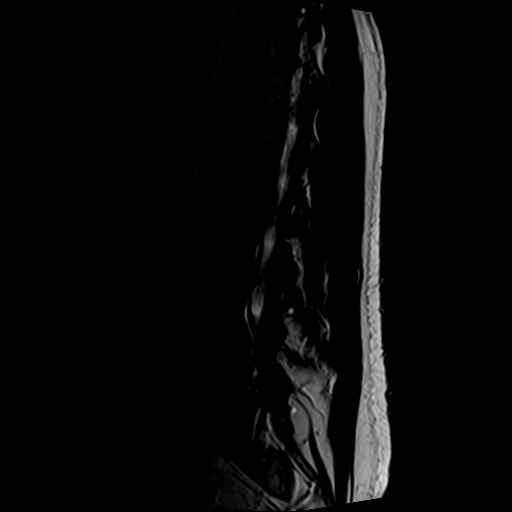
[im 13/16]
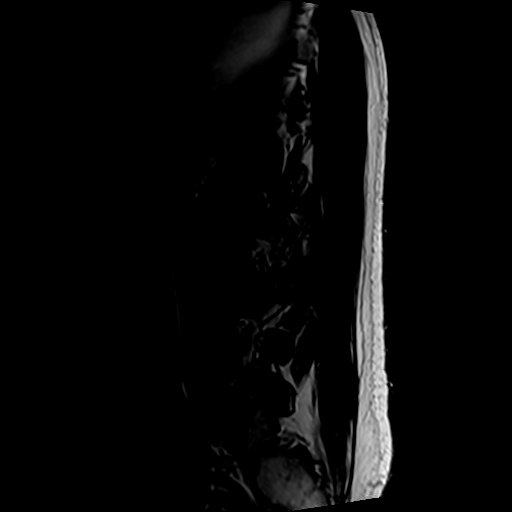
[im 16/16]
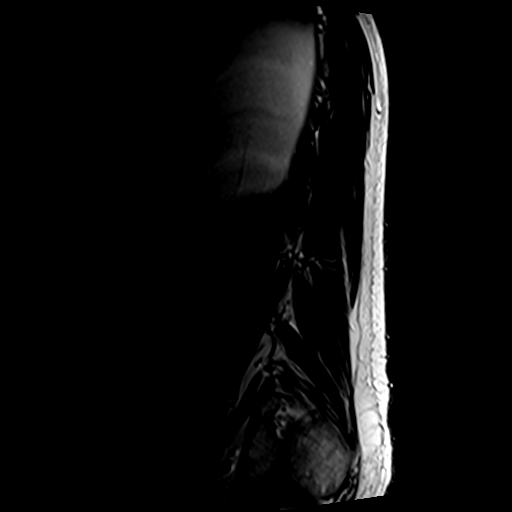

[Series 4: T1 · sagittal · 4.0mm · 0.55mm/px · 5 of 16 slices shown (1 of 2)]
[im 1/16]
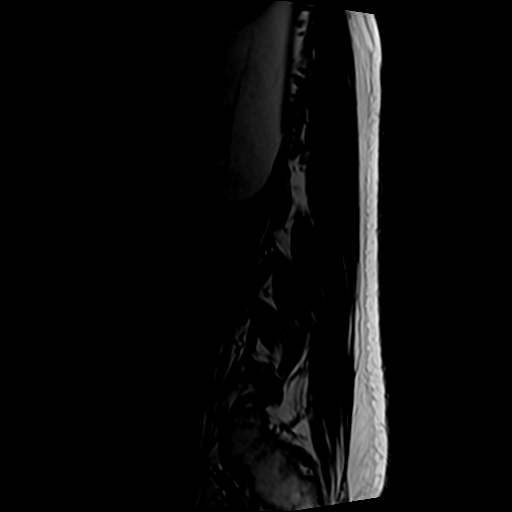
[im 4/16]
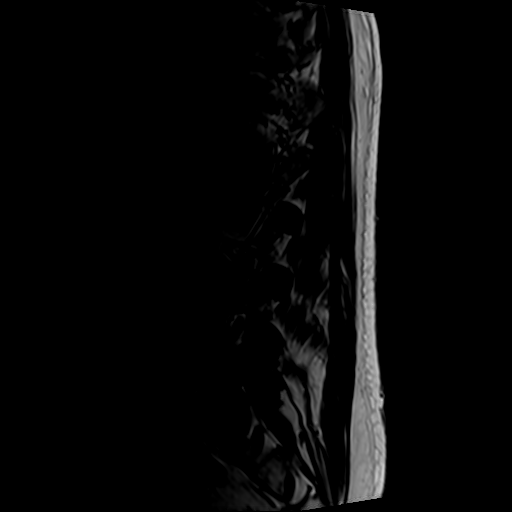
[im 7/16]
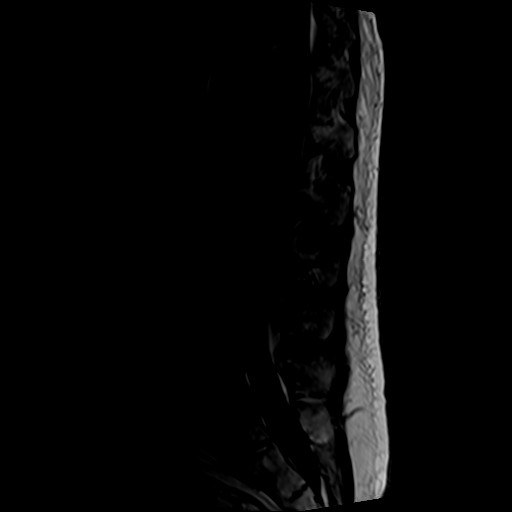
[im 10/16]
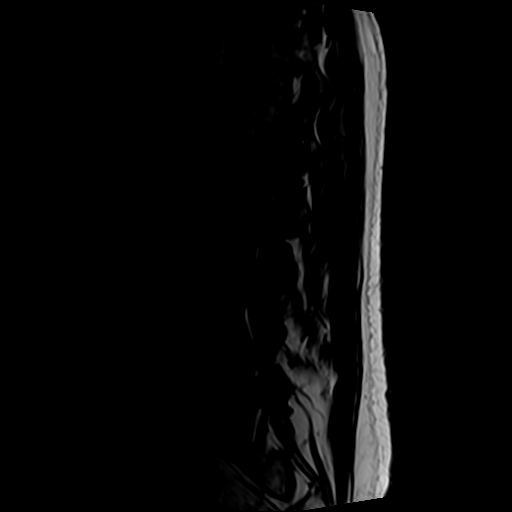
[im 16/16]
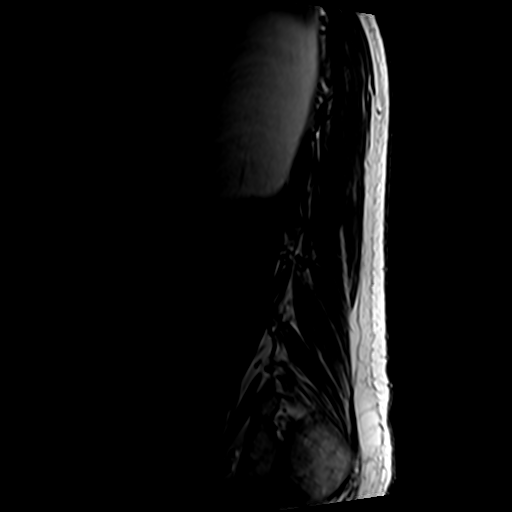

[Series 6: T2 · axial · 4.0mm · 0.70mm/px · z∈[-77,+142]mm · 9 of 39 slices shown (2 of 2)]
[im 1/39]
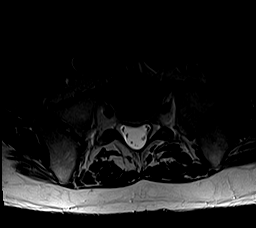
[im 6/39]
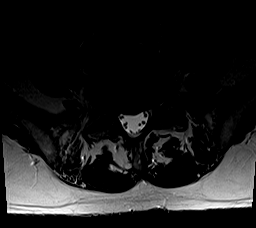
[im 11/39]
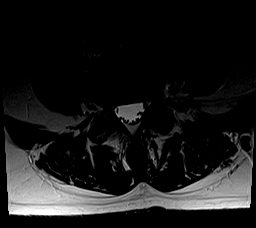
[im 17/39]
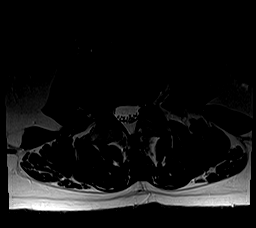
[im 20/39]
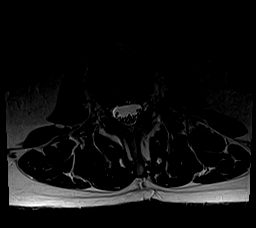
[im 22/39]
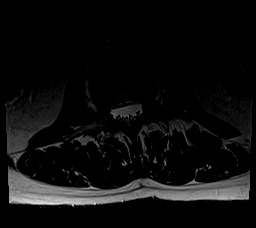
[im 28/39]
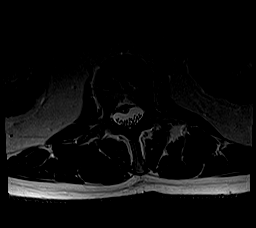
[im 33/39]
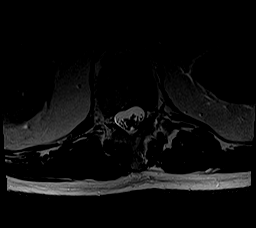
[im 39/39]
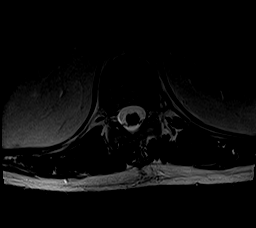

[Series 7: T1 · axial · 4.0mm · 0.35mm/px · z∈[-51,+111]mm · 3 of 39 slices shown (2 of 2)]
[im 6/39]
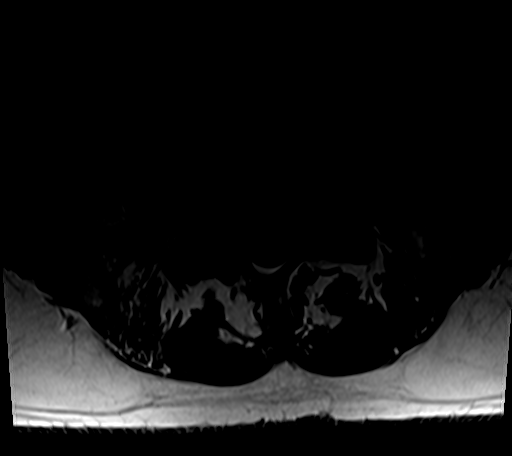
[im 20/39]
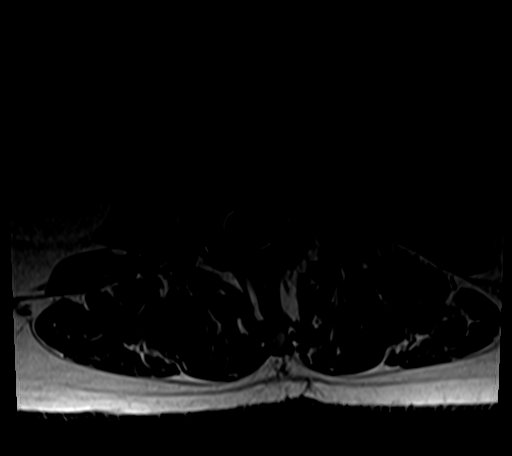
[im 33/39]
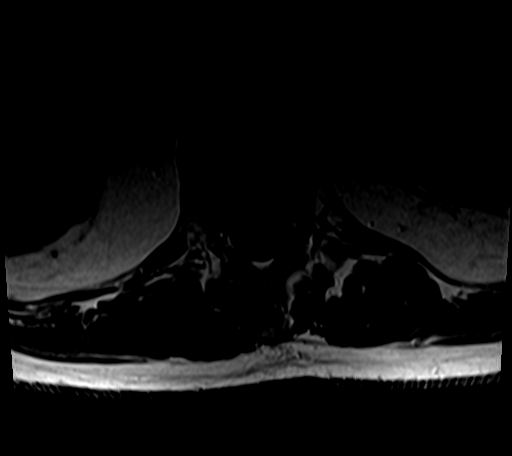

[23 of 48 positions shown; findings below may reference images not displayed]

FINDINGS: Segmentation: 5 lumbar vertebrae. The caudal most well-formed
intervertebral disc space is designated L5-S1.

Alignment:  2 mm L2-L3 and L3-L4 grade 1 retrolisthesis.

Vertebrae: Vertebral body height is maintained. Mild degenerative
endplate edema at L5-S1.

Conus medullaris and cauda equina: Conus extends to the L1-L2 level.
No signal abnormality within the visualized distal spinal cord.

Paraspinal and other soft tissues: Small right renal cyst.
Paraspinal soft tissues within normal limits.

Disc levels:

Mild-to-moderate disc degeneration at L5-S1. No more than mild disc
degeneration at the remaining levels.

T11-T12: Imaged sagittally. Facet arthrosis. No significant disc
herniation or stenosis.

T12-L1: Mild facet arthrosis. No significant disc herniation or
stenosis.

L1-L2: Broad-based right center/subarticular disc protrusion at site
of posterior annular fissure. Mild relative right subarticular and
central canal narrowing without appreciable nerve root impingement.
No significant foraminal stenosis.

L2-L3: Grade 1 retrolisthesis. Disc bulge. Superimposed 2 cm right
center disc extrusion at site of posterior annular fissure. The disc
extrusion has migrated cephalad to the level of the upper L2
vertebral body. The disc extrusion slightly crowds the descending
right L2 nerve root prior to entering the right neural foramen (for
instance as seen on series 6, images 15 and 16). Mild relative
narrowing of the central canal. No significant foraminal stenosis

L3-L4: Grade 1 retrolisthesis. Disc bulge with mild endplate
spurring. Superimposed broad-based right foraminal/extraforaminal
disc protrusion. Additional superimposed broad-based left
center/subarticular disc protrusion. Mild facet arthrosis the left
center/subarticular disc protrusion contributes to mild left
subarticular narrowing and contacts the descending left L4 nerve
root. Central canal patent. Mild right neural foraminal narrowing

L4-L5: Disc bulge. Superimposed broad-based right
foraminal/extraforaminal disc protrusion. Mild facet arthrosis. No
significant spinal canal stenosis. Mild right neural foraminal
narrowing.

L5-S1: Disc bulge with endplate spurring. Superimposed broad-based
central/right subarticular disc protrusion with minimal caudal
migration and associated osteophyte ridge. Mild/moderate facet
arthrosis. The disc protrusion contributes to moderate right
subarticular narrowing, contacting and crowding the descending right
S1 nerve root (series 6, image 35). It also contributes to mild left
subarticular and central canal narrowing. Bilateral neural foraminal
narrowing (severe right, moderate left).
IMPRESSION: Lumbar spondylosis, as outlined and with findings most notably as
follows.

At L5-S1, there is mild/moderate disc degeneration with mild
degenerative endplate edema. A central/right subarticular disc
protrusion with associated osteophyte ridge contributes to moderate
right subarticular narrowing, contacting and crowding the descending
right S1 nerve root. Multifactorial bilateral neural foraminal
narrowing (severe right, moderate left).

At L3-L4, a broad-based disc protrusion contributes to mild left
subarticular narrowing, contacting the descending left L4 nerve
root. A right foraminal/extraforaminal disc protrusion also
contributes to mild right neural foraminal narrowing.

At L2-L3, there is a right center disc extrusion at site of
posterior annular fissure. This disc extrusion has migrated cephalad
to the upper L2 vertebral body level. The disc extrusion slightly
crowds the descending right L2 nerve root prior to entering the
right neural foramen. It also contributes to mild relative narrowing
of the central canal.

At L1-L2, there is a broad-based center/right subarticular disc
protrusion. This contributes to mild right subarticular narrowing
without appreciable nerve root impingement.

No more than mild spinal canal or neural foraminal narrowing at the
remaining levels.

## 2021-05-09 ENCOUNTER — Other Ambulatory Visit: Payer: Self-pay | Admitting: Physician Assistant

## 2021-05-09 MED ORDER — NITROGLYCERIN 0.4 MG SL SUBL: 0.4000 mg | SUBLINGUAL_TABLET | SUBLINGUAL | 99 refills | Status: DC | PRN

## 2021-05-16 ENCOUNTER — Telehealth: Payer: Self-pay | Admitting: Physician Assistant

## 2021-05-16 NOTE — Telephone Encounter (Signed)
Pt called to get number for counseling.  He says no SI, HI, just mostly trouble sleeping and wants to talk with someone.  He is given number for [facility].

## 2021-05-28 ENCOUNTER — Encounter: Payer: Self-pay | Admitting: Physician Assistant

## 2021-05-28 ENCOUNTER — Ambulatory Visit: Payer: Self-pay | Admitting: Physician Assistant

## 2021-05-28 VITALS — BP 110/69 | HR 66 | Temp 98.7°F | Wt 206.0 lb

## 2021-05-28 DIAGNOSIS — I2510 Atherosclerotic heart disease of native coronary artery without angina pectoris: Secondary | ICD-10-CM

## 2021-05-28 NOTE — Progress Notes (Signed)
BP 110/69   Pulse 66   Temp 98.7 F (37.1 C)   Wt 206 lb (93.4 kg)   SpO2 98%   BMI 29.56 kg/m    Subjective:    Patient ID: Edward Fisher, male    DOB: Jul 22, 1966, 55 y.o.   MRN: [id number]  HPI: Edward Fisher is a 55 y.o. male presenting on 05/28/2021 for Follow-up   HPI  Pt had a negtive covid 19 screening questionnaire.   Pt is 60yoM who presents for routine follow up for CAD.  He saw his cardiologist in March.  Pt did not get labs drawn in June as ordered by cardiology.     Pt says he saw orthopedics for his shoulder who sent him for physical therapy.  Orthopedist also referred him to a neurologist.  The neurologist he has been seeing is affiliated with [facility] with whom pt does not have financial assistance.    Pt says he is not having and chest pains or sob.    Relevant past medical, surgical, family and social history reviewed and updated as indicated. Interim medical history since our last visit reviewed. Allergies and medications reviewed and updated.   Current Outpatient Medications:    aspirin 81 MG chewable tablet, Chew 81 mg by mouth every evening. TO START DOSAGE IN 6 MONTHS APPROXIMATELY July 2014, Disp: , Rfl:    atorvastatin (LIPITOR) 20 MG tablet, Take 20 mg by mouth daily., Disp: , Rfl:    metoprolol succinate (TOPROL-XL) 25 MG 24 hr tablet, Take 1 tablet by mouth once daily, Disp: 90 tablet, Rfl: 2   nitroGLYCERIN (NITROSTAT) 0.4 MG SL tablet, Place 1 tablet (0.4 mg total) under the tongue every 5 (five) minutes as needed for chest pain., Disp: 25 tablet, Rfl: PRN   omega-3 acid ethyl esters (LOVAZA) 1 G capsule, Take 1 g by mouth every evening., Disp: , Rfl:    atorvastatin (LIPITOR) 40 MG tablet, Take 1 tablet (40 mg total) by mouth daily. (Patient not taking: Reported on 02/27/2021), Disp: 90 tablet, Rfl: 3   tamsulosin (FLOMAX) 0.4 MG CAPS capsule, Take 1 capsule (0.4 mg total) by mouth daily. (Patient not taking: No sig reported), Disp: 30 capsule,  Rfl: 4   Review of Systems  Per HPI unless specifically indicated above     Objective:    BP 110/69   Pulse 66   Temp 98.7 F (37.1 C)   Wt 206 lb (93.4 kg)   SpO2 98%   BMI 29.56 kg/m   Wt Readings from Last 3 Encounters:  05/28/21 206 lb (93.4 kg)  01/19/21 211 lb (95.7 kg)  10/02/20 223 lb 8 oz (101.4 kg)    Physical Exam Vitals reviewed.  Constitutional:      General: He is not in acute distress.    Appearance: He is well-developed. He is not ill-appearing.  HENT:     Head: Normocephalic and atraumatic.  Cardiovascular:     Rate and Rhythm: Normal rate and regular rhythm.  Pulmonary:     Effort: Pulmonary effort is normal.     Breath sounds: Normal breath sounds. No wheezing.  Abdominal:     General: Bowel sounds are normal.     Palpations: Abdomen is soft.     Tenderness: There is no abdominal tenderness.  Musculoskeletal:     Cervical back: Neck supple.     Right lower leg: No edema.     Left lower leg: No edema.  Lymphadenopathy:  Cervical: No cervical adenopathy.  Skin:    General: Skin is warm and dry.  Neurological:     Mental Status: He is alert and oriented to person, place, and time.  Psychiatric:        Behavior: Behavior normal.        Assessment & Plan:     Encounter Diagnosis  Name Primary?   Arteriosclerotic cardiovascular disease (ASCVD) Yes     -no medication changes today -pt is encouraged to get the labs drawn that were ordered by his cardiologist -pt was given appliction for [facility] financial assistance and was encouraged to look on-line for further information about it -he is encouraged to get covid vaccination -pt to follow up here 3 months.  He is to contact office sooner prn

## 2021-06-04 ENCOUNTER — Other Ambulatory Visit: Payer: Self-pay

## 2021-06-04 ENCOUNTER — Other Ambulatory Visit (HOSPITAL_COMMUNITY)
Admission: RE | Admit: 2021-06-04 | Discharge: 2021-06-04 | Disposition: A | Discharge status: Home / Self Care | Payer: No Typology Code available for payment source | Source: Ambulatory Visit | Attending: Cardiology | Admitting: Cardiology

## 2021-06-04 DIAGNOSIS — E785 Hyperlipidemia, unspecified: Secondary | ICD-10-CM | POA: Insufficient documentation

## 2021-06-04 DIAGNOSIS — I2510 Atherosclerotic heart disease of native coronary artery without angina pectoris: Secondary | ICD-10-CM | POA: Insufficient documentation

## 2021-06-04 DIAGNOSIS — Z79899 Other long term (current) drug therapy: Secondary | ICD-10-CM | POA: Insufficient documentation

## 2021-06-04 LAB — LIPID PANEL
Cholesterol: 111 mg/dL (ref 0–200)
HDL: 28 mg/dL — ABNORMAL LOW (ref >40)
LDL Cholesterol: 67 mg/dL (ref 0–99)
Total CHOL/HDL Ratio: 4 RATIO
Triglycerides: 78 mg/dL (ref <150)
VLDL: 16 mg/dL (ref 0–40)

## 2021-06-04 LAB — HEPATIC FUNCTION PANEL
ALT: 32 U/L (ref 0–44)
AST: 23 U/L (ref 15–41)
Albumin: 4.3 g/dL (ref 3.5–5.0)
Alkaline Phosphatase: 64 U/L (ref 38–126)
Bilirubin, Direct: 0.1 mg/dL (ref 0.0–0.2)
Indirect Bilirubin: 0.5 mg/dL (ref 0.3–0.9)
Total Bilirubin: 0.6 mg/dL (ref 0.3–1.2)
Total Protein: 7.5 g/dL (ref 6.5–8.1)

## 2021-06-11 ENCOUNTER — Encounter: Payer: Self-pay | Admitting: *Deleted

## 2021-06-24 ENCOUNTER — Encounter (HOSPITAL_COMMUNITY): Payer: Self-pay | Admitting: Emergency Medicine

## 2021-06-24 ENCOUNTER — Other Ambulatory Visit: Payer: Self-pay

## 2021-06-24 ENCOUNTER — Emergency Department (HOSPITAL_COMMUNITY): Payer: No Typology Code available for payment source

## 2021-06-24 ENCOUNTER — Emergency Department (HOSPITAL_COMMUNITY)
Admission: EM | Admit: 2021-06-24 | Discharge: 2021-06-24 | Disposition: A | Discharge status: Home / Self Care | Payer: No Typology Code available for payment source | Attending: Emergency Medicine | Admitting: Emergency Medicine

## 2021-06-24 DIAGNOSIS — I10 Essential (primary) hypertension: Secondary | ICD-10-CM | POA: Insufficient documentation

## 2021-06-24 DIAGNOSIS — Z79899 Other long term (current) drug therapy: Secondary | ICD-10-CM | POA: Insufficient documentation

## 2021-06-24 DIAGNOSIS — Z87891 Personal history of nicotine dependence: Secondary | ICD-10-CM | POA: Insufficient documentation

## 2021-06-24 DIAGNOSIS — N201 Calculus of ureter: Secondary | ICD-10-CM

## 2021-06-24 DIAGNOSIS — D72829 Elevated white blood cell count, unspecified: Secondary | ICD-10-CM | POA: Insufficient documentation

## 2021-06-24 DIAGNOSIS — Z951 Presence of aortocoronary bypass graft: Secondary | ICD-10-CM | POA: Insufficient documentation

## 2021-06-24 DIAGNOSIS — Z7982 Long term (current) use of aspirin: Secondary | ICD-10-CM | POA: Insufficient documentation

## 2021-06-24 LAB — CBC WITH DIFFERENTIAL/PLATELET
Abs Immature Granulocytes: 0.02 10*3/uL (ref 0.00–0.07)
Basophils Absolute: 0.1 10*3/uL (ref 0.0–0.1)
Basophils Relative: 1 %
Eosinophils Absolute: 0.2 10*3/uL (ref 0.0–0.5)
Eosinophils Relative: 2 %
HCT: 46.6 % (ref 39.0–52.0)
Hemoglobin: 15.5 g/dL (ref 13.0–17.0)
Immature Granulocytes: 0 %
Lymphocytes Relative: 22 %
Lymphs Abs: 2.5 10*3/uL (ref 0.7–4.0)
MCH: 30.3 pg (ref 26.0–34.0)
MCHC: 33.3 g/dL (ref 30.0–36.0)
MCV: 91 fL (ref 80.0–100.0)
Monocytes Absolute: 1 10*3/uL (ref 0.1–1.0)
Monocytes Relative: 9 %
Neutro Abs: 7.4 10*3/uL (ref 1.7–7.7)
Neutrophils Relative %: 66 %
Platelets: 161 10*3/uL (ref 150–400)
RBC: 5.12 MIL/uL (ref 4.22–5.81)
RDW: 12.3 % (ref 11.5–15.5)
WBC: 11.1 10*3/uL — ABNORMAL HIGH (ref 4.0–10.5)
nRBC: 0 % (ref 0.0–0.2)

## 2021-06-24 LAB — BASIC METABOLIC PANEL
Anion gap: 6 (ref 5–15)
BUN: 16 mg/dL (ref 6–20)
CO2: 25 mmol/L (ref 22–32)
Calcium: 8.9 mg/dL (ref 8.9–10.3)
Chloride: 107 mmol/L (ref 98–111)
Creatinine, Ser: 1.15 mg/dL (ref 0.61–1.24)
GFR, Estimated: >60 mL/min (ref >60)
Glucose, Bld: 98 mg/dL (ref 70–99)
Potassium: 3.3 mmol/L — ABNORMAL LOW (ref 3.5–5.1)
Sodium: 138 mmol/L (ref 135–145)

## 2021-06-24 LAB — URINALYSIS, ROUTINE W REFLEX MICROSCOPIC
Bacteria, UA: NONE SEEN
Bilirubin Urine: NEGATIVE
Glucose, UA: NEGATIVE mg/dL
Ketones, ur: NEGATIVE mg/dL
Leukocytes,Ua: NEGATIVE
Nitrite: NEGATIVE
Protein, ur: NEGATIVE mg/dL
RBC / HPF: >50 RBC/hpf — ABNORMAL HIGH (ref 0–5)
Specific Gravity, Urine: 1.016 (ref 1.005–1.030)
pH: 5 (ref 5.0–8.0)

## 2021-06-24 IMAGING — CT CT RENAL STONE PROTOCOL
2 of 4 series · 16 of 46 positions shown, 18 images · non-contrast
Comparison: [DATE]

CLINICAL DATA: Left flank pain, microscopic hematuria, history of
nephrolithiasis

EXAM:
CT ABDOMEN AND PELVIS WITHOUT CONTRAST
TECHNIQUE: Multidetector CT imaging of the abdomen and pelvis was performed
following the standard protocol without IV contrast.

[Series 2: axial st · axial · 0.85mm/px · z∈[+874,+1274]mm · 13 of 92 slices shown, 15 images]
[im 6/92  soft-tissue]
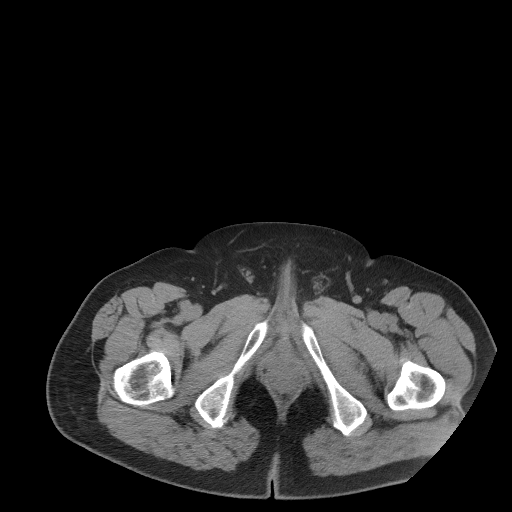
[im 6/92  bone]
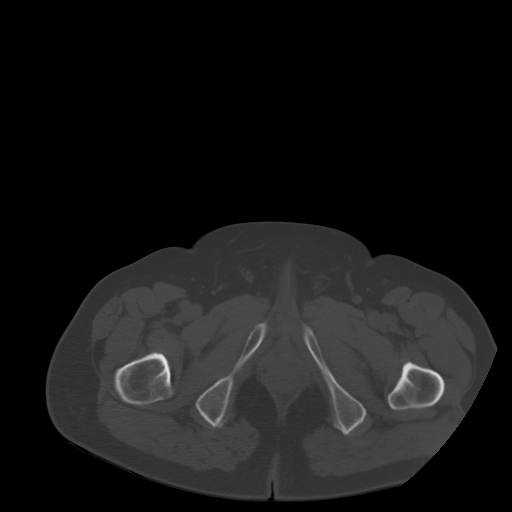
[im 12/92  soft-tissue]
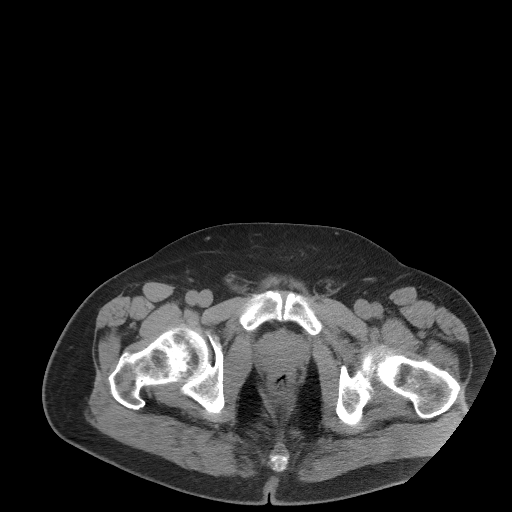
[im 18/92  soft-tissue]
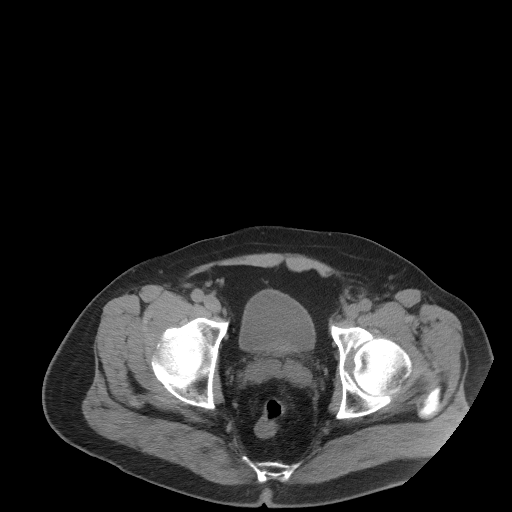
[im 29/92  soft-tissue]
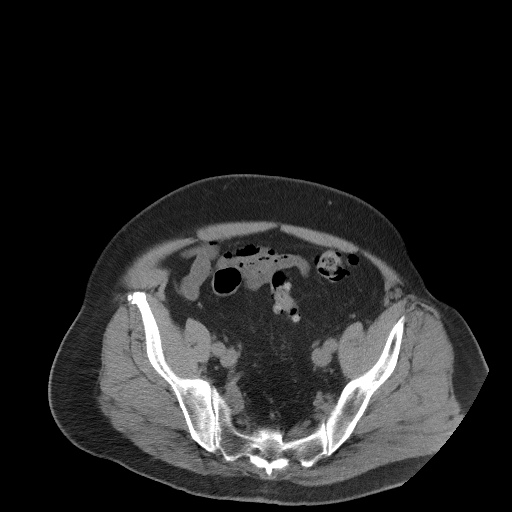
[im 35/92  soft-tissue]
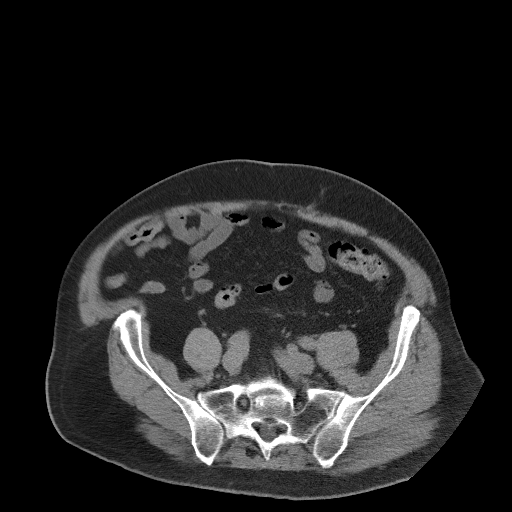
[im 40/92  soft-tissue]
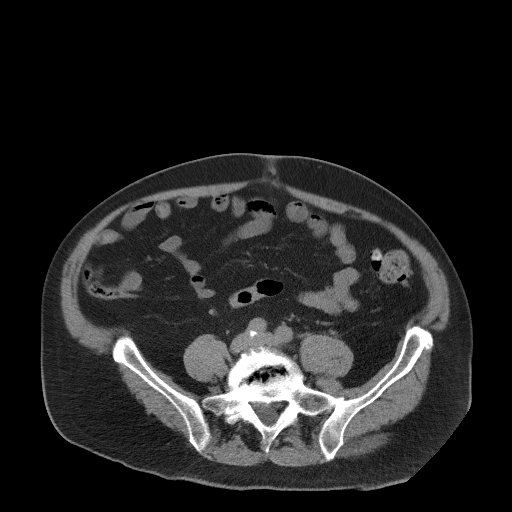
[im 46/92  soft-tissue]
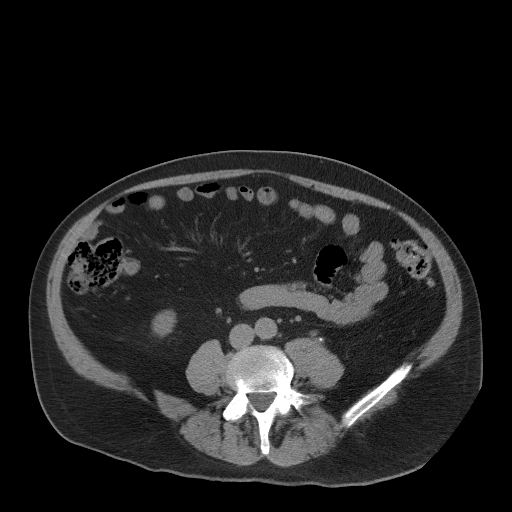
[im 52/92  soft-tissue]
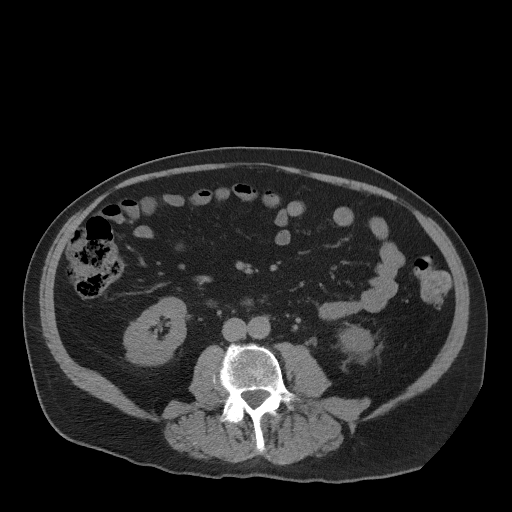
[im 57/92  soft-tissue]
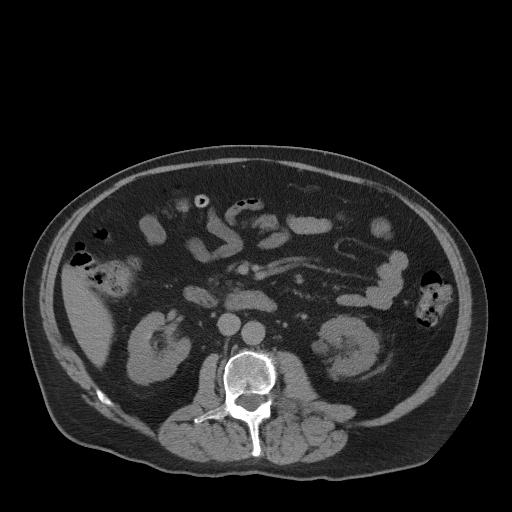
[im 57/92  bone]
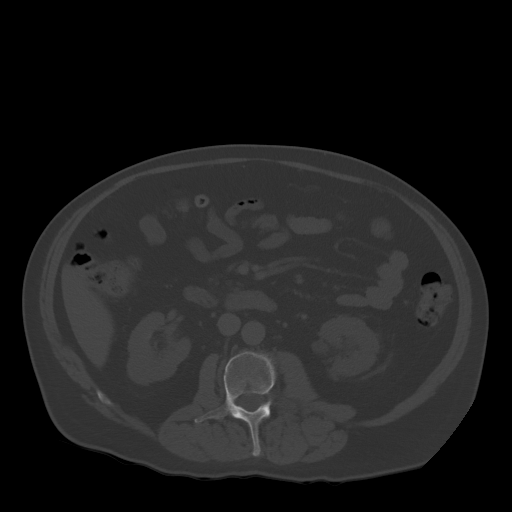
[im 63/92  soft-tissue]
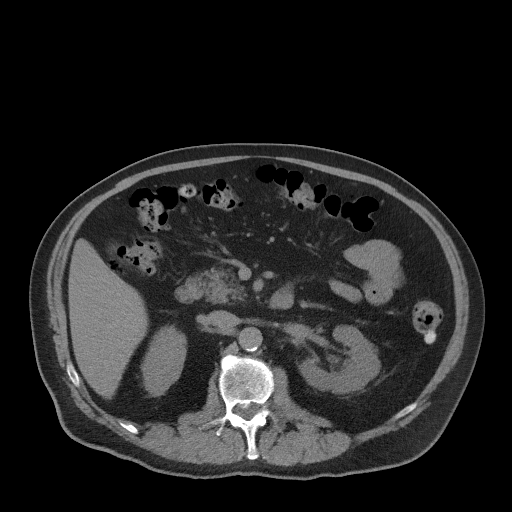
[im 74/92  soft-tissue]
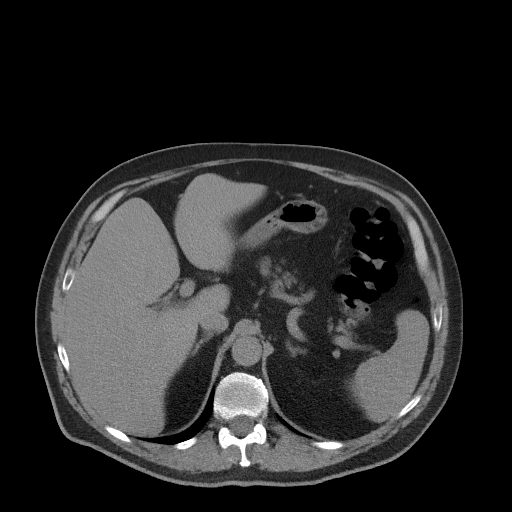
[im 80/92  soft-tissue]
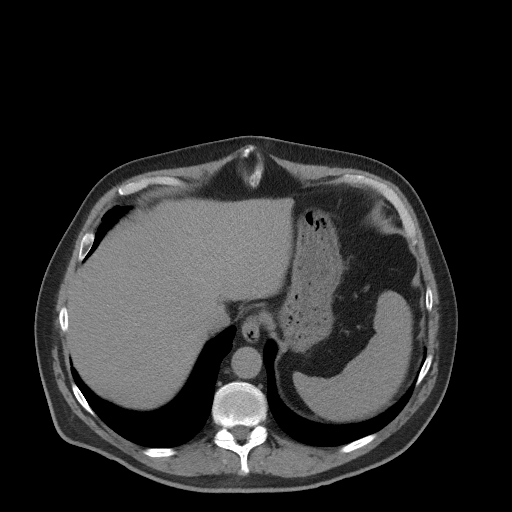
[im 86/92  soft-tissue]
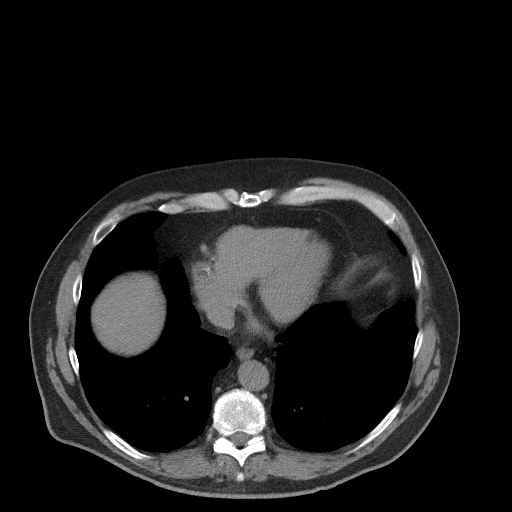

[Series 5: coronal st · coronal · 0.77mm/px · 3 of 101 slices shown]
[im 34/101  soft-tissue]
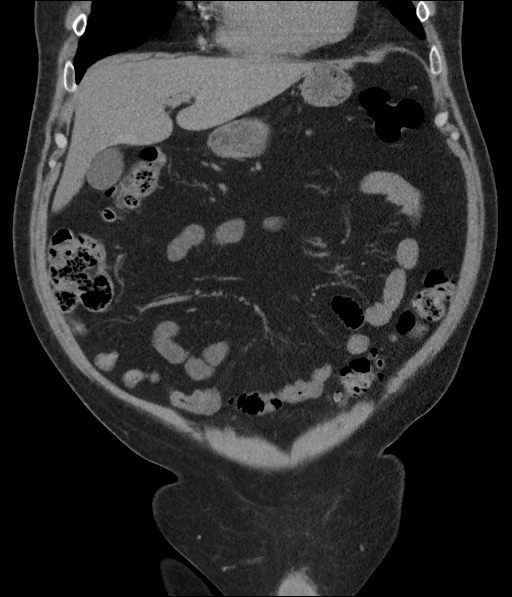
[im 45/101  soft-tissue]
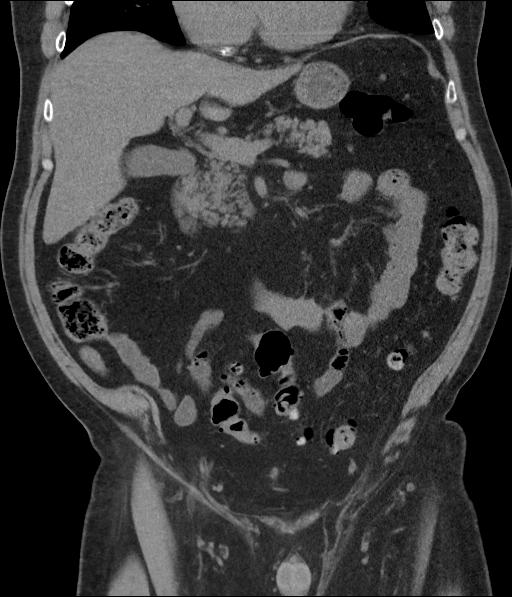
[im 56/101  soft-tissue]
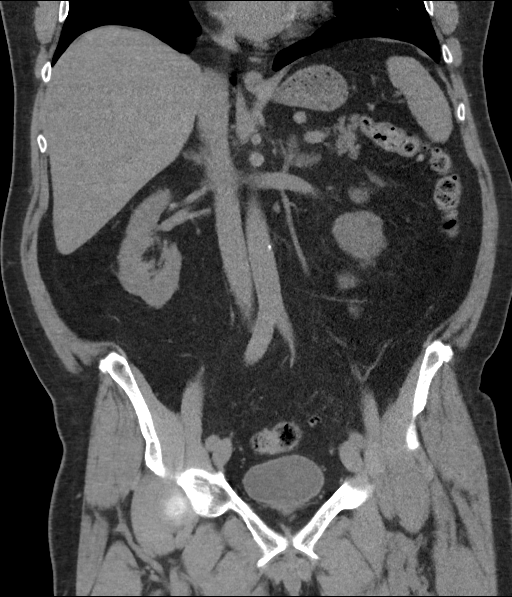

[16 of 46 positions shown; findings below may reference images not displayed]

FINDINGS: Lower chest: 5 mm right middle lobe pulmonary nodule within the
visualized right lung base is stable since prior examination and
safely considered benign. The visualized lung bases are otherwise
clear. Extensive right coronary artery calcification. Global cardiac
size within normal limits. No pericardial effusion.

Hepatobiliary: No focal liver abnormality is seen. No gallstones,
gallbladder wall thickening, or biliary dilatation.

Pancreas: Unremarkable

Spleen: Unremarkable

Adrenals/Urinary Tract: The adrenal glands are unremarkable. The
kidneys are normal in size and position. An obstructing 2 mm x 3 mm
x 5 mm calculus is seen within the proximal left ureter at the level
of the transverse process of L4 resulting in mild left
hydronephrosis. No additional nephro or urolithiasis noted on the
left. Two punctate 1-2 mm nonobstructing calculi are noted within
the upper pole the right kidney. No hydronephrosis on the right. The
bladder is unremarkable.

Stomach/Bowel: Moderate transverse, descending, and sigmoid colonic
diverticulosis. The stomach, small bowel, and large bowel are
otherwise unremarkable. Appendix normal. No evidence of obstruction
or focal inflammation. No free intraperitoneal gas or fluid.

Vascular/Lymphatic: Mild aortoiliac atherosclerotic calcification.
No aortic aneurysm. No pathologic adenopathy within the abdomen and
pelvis.

Reproductive: Moderate prostatic enlargement. Seminal vesicles are
unremarkable.

Other: Tiny fat containing umbilical hernia.  Rectum unremarkable.

Musculoskeletal: No acute bone abnormality. No lytic or blastic bone
lesions are identified.
IMPRESSION: Obstructing 2 x 3 x 5 mm calculus within the proximal left ureter
resulting in mild left hydronephrosis.

Minimal nonobstructing right nephrolithiasis.

Extensive right coronary artery calcification.

Moderate colonic diverticulosis.

Moderate prostatic enlargement.

Aortic Atherosclerosis ([QO]-[QO]).

## 2021-06-24 MED ORDER — OXYCODONE-ACETAMINOPHEN 5-325 MG PO TABS
1.0000 | ORAL_TABLET | Freq: Three times a day (TID) | ORAL | 0 refills | Status: AC | PRN
Start: 2021-06-24 — End: 2021-06-26

## 2021-06-24 MED ORDER — OXYCODONE-ACETAMINOPHEN 5-325 MG PO TABS: 1.0000 | ORAL_TABLET | Freq: Three times a day (TID) | ORAL | 0 refills | Status: AC | PRN

## 2021-06-24 MED ORDER — ONDANSETRON HCL 4 MG PO TABS: 4.0000 mg | ORAL_TABLET | Freq: Three times a day (TID) | ORAL | 0 refills | Status: DC | PRN

## 2021-06-24 MED ORDER — TAMSULOSIN HCL 0.4 MG PO CAPS: 0.4000 mg | ORAL_CAPSULE | Freq: Every day | ORAL | 0 refills | Status: AC

## 2021-06-24 MED ORDER — TAMSULOSIN HCL 0.4 MG PO CAPS
0.4000 mg | ORAL_CAPSULE | Freq: Once | ORAL | Status: AC
  Administered 2021-06-24: 0.4 mg via ORAL
  Filled 2021-06-24: qty 1

## 2021-06-24 NOTE — Discharge Instructions (Addendum)
You have a 5 mm stone in your left ureter I have started you on Flomax please take as prescribed.  This medication can drop your blood pressure if you start to feel lightheaded or dizzy dizziness please discontinues medication.  I given you a prescription for Zofran please use as needed for nausea.  Please drink plenty fluids to help flush out the kidney stone.I have given you a short course of narcotics please take as prescribed.  This medication can make you drowsy do not consume alcohol or operate heavy machinery when taking this medication.  This medication is Tylenol in it do not take Tylenol and take this medication   Please follow-up with urology for further evaluation.  Come back to the emergency department if you develop chest pain, shortness of breath, severe abdominal pain, uncontrolled nausea, vomiting, diarrhea.

## 2021-06-24 NOTE — ED Triage Notes (Addendum)
Pt with c/o L sided flank pain. Denies any associated urinary symptoms. Pt states he is pain free now.

## 2021-06-24 NOTE — ED Provider Notes (Signed)
[facility] EMERGENCY DEPARTMENT Provider Note   CSN: [id number] Arrival date & time: 06/24/21  1922     History Chief Complaint  Patient presents with   Flank Pain    Edward Fisher is a 55 y.o. male.  HPI  Patient with significant medical history of hyperlipidemia, kidney stones present with chief complaint of left-sided flank pain.  Patient states pain started suddenly today, severe pain on his left flank which radiated to his left lower abdomen, describes as a stabbing sensation, wax and wane in intensity.  Denies urinary symptoms, hematuria, dysuria, urinary frequency or difficulty urination, he denies stomach pain, nausea, vomiting, diarrhea, constipation, denies fevers or chills.  He has no significant abdominal history, does endorse that he has a history of kidney stones and states this feels similar to this.  Patient states pain has decreased significantly, states is about a 1 at this time.  Does not endorse fevers, chills, chest pain, shortness of breath, worsening pedal edema. Past Medical History:  Diagnosis Date   Arteriosclerotic cardiovascular disease (ASCVD)    Onset in 04/2012; CABG in 06/2012 w/ LIMA-LAD, L Rad-CFX, SVG-PL-PDA   Hyperlipidemia    goal LDL < 70   Insomnia    Nephrolithiasis     Patient Active Problem List   Diagnosis Date Noted   Tremor of right hand 05/02/2020   Uncontrolled REM sleep behavior disorder 05/02/2020   Hemiparesis of right dominant side ([place]) 05/02/2020   Chest pain 06/22/2014   Essential hypertension 06/22/2014   Belching 06/22/2014   Local skin infection 03/16/2013   Arteriosclerotic cardiovascular disease (ASCVD) 06/26/2012   Hyperlipidemia     Past Surgical History:  Procedure Laterality Date   CORONARY ARTERY BYPASS GRAFT  06/24/2012   Procedure: CORONARY ARTERY BYPASS GRAFTING (CABG);  Surgeon: [name], MD;  Location: [facility];  Service: Open Heart Surgery;  Laterality: N/A;  Coronary Artery  Bypass X 4 utilizing  endoscopic saphenous vein graft and  Left radial Artery Harvest    CYSTOSCOPY W/ RETROGRADES Left 08/23/2015   Procedure: CYSTOSCOPY WITH RETROGRADE PYELOGRAM;  Surgeon: [name], MD;  Location: [facility];  Service: Urology;  Laterality: Left;   CYSTOSCOPY WITH URETEROSCOPY, STONE BASKETRY AND STENT PLACEMENT Left 08/23/2015   Procedure: CYSTOSCOPY WITH URETEROSCOPY, STONE BASKET EXTRACTION;  Surgeon: [name], MD;  Location: [facility];  Service: Urology;  Laterality: Left;   HOLMIUM LASER APPLICATION Left [id number]   Procedure: HOLMIUM LASER APPLICATION;  Surgeon: [name], MD;  Location: [facility];  Service: Urology;  Laterality: Left;   LEFT HEART CATHETERIZATION WITH CORONARY ANGIOGRAM N/A 06/23/2012   Procedure: LEFT HEART CATHETERIZATION WITH CORONARY ANGIOGRAM;  Surgeon: [name], MD;  Location: [facility] CATH LAB;  Service: Cardiovascular;  Laterality: N/A;   RHINOPLASTY     TONSILLECTOMY  ~1971       Family History  Problem Relation Age of Onset   Hypertension Mother    Ulcers Father        Peptic ulcer disease   Coronary artery disease Father        Stents   Coronary artery disease Brother        CABG    Social History   Tobacco Use   Smoking status: Never   Smokeless tobacco: Former    Types: Chew    Quit date: 1997   Tobacco comments:    06/22/2012 "quit chewing 10-15 years ago"  Vaping Use   Vaping Use: Never used  Substance  Use Topics   Alcohol use: No    Alcohol/week: 0.0 standard drinks   Drug use: No    Home Medications Prior to Admission medications   Medication Sig Start Date End Date Taking? Authorizing Provider  ondansetron (ZOFRAN) 4 MG tablet Take 1 tablet (4 mg total) by mouth every 8 (eight) hours as needed for nausea or vomiting. 06/24/21  Yes Marcello Fennel, PA-C  oxyCODONE-acetaminophen (PERCOCET/ROXICET) 5-325 MG tablet Take 1 tablet by mouth every 8 (eight) hours as needed for up to 2 days for severe pain. 06/24/21  06/26/21 Yes Marcello Fennel, PA-C  oxyCODONE-acetaminophen (PERCOCET/ROXICET) 5-325 MG tablet Take 1 tablet by mouth every 8 (eight) hours as needed for up to 2 days for severe pain. 06/24/21 06/26/21 Yes Marcello Fennel, PA-C  tamsulosin (FLOMAX) 0.4 MG CAPS capsule Take 1 capsule (0.4 mg total) by mouth daily for 10 days. 06/24/21 07/04/21 Yes Marcello Fennel, PA-C  aspirin 81 MG chewable tablet Chew 81 mg by mouth every evening. TO START DOSAGE IN 6 MONTHS APPROXIMATELY July 2014    [provider]  atorvastatin (LIPITOR) 20 MG tablet Take 20 mg by mouth daily.    [provider]  atorvastatin (LIPITOR) 40 MG tablet Take 1 tablet (40 mg total) by mouth daily. Patient not taking: Reported on 02/27/2021 01/19/21 04/19/21  Minus Breeding, MD  metoprolol succinate (TOPROL-XL) 25 MG 24 hr tablet Take 1 tablet by mouth once daily 08/02/20   Verta Ellen., NP  nitroGLYCERIN (NITROSTAT) 0.4 MG SL tablet Place 1 tablet (0.4 mg total) under the tongue every 5 (five) minutes as needed for chest pain. 05/09/21   Soyla Dryer, PA-C  omega-3 acid ethyl esters (LOVAZA) 1 G capsule Take 1 g by mouth every evening.    [provider]  tamsulosin (FLOMAX) 0.4 MG CAPS capsule Take 1 capsule (0.4 mg total) by mouth daily. Patient not taking: No sig reported 11/28/20   Soyla Dryer, PA-C    Allergies    Prozac [fluoxetine] and Pollen extract  Review of Systems   Review of Systems  Constitutional:  Negative for chills and fever.  HENT:  Negative for congestion.   Respiratory:  Negative for shortness of breath.   Cardiovascular:  Negative for chest pain.  Gastrointestinal:  Positive for abdominal pain. Negative for diarrhea, nausea and vomiting.  Genitourinary:  Positive for flank pain. Negative for dysuria, enuresis, frequency and testicular pain.  Musculoskeletal:  Negative for back pain.  Skin:  Negative for rash.  Neurological:  Negative for headaches.   Hematological:  Does not bruise/bleed easily.   Physical Exam Updated Vital Signs BP (!) 171/64 (BP Location: Left Arm)   Pulse 73   Temp 98.5 F (36.9 C) (Oral)   Resp 18   Ht '5\' 10"'$  (1.778 m)   Wt 90.7 kg   SpO2 99%   BMI 28.70 kg/m   Physical Exam Vitals and nursing note reviewed.  Constitutional:      General: He is not in acute distress.    Appearance: He is not ill-appearing.  HENT:     Head: Normocephalic and atraumatic.     Nose: No congestion.  Eyes:     Conjunctiva/sclera: Conjunctivae normal.  Cardiovascular:     Rate and Rhythm: Normal rate and regular rhythm.     Pulses: Normal pulses.     Heart sounds: No murmur heard.   No friction rub. No gallop.  Pulmonary:     Effort: No respiratory  distress.     Breath sounds: No wheezing, rhonchi or rales.  Abdominal:     Palpations: Abdomen is soft.     Tenderness: There is no abdominal tenderness. There is left CVA tenderness. There is no right CVA tenderness.     Comments: Abdomen nondistended normal bowel sounds, dull to percussion, no tenderness to palpation, no guarding or rebound tenderness.  He had slight left CVA tenderness.  Musculoskeletal:     Right lower leg: No edema.     Left lower leg: No edema.  Skin:    General: Skin is warm and dry.  Neurological:     Mental Status: He is alert.  Psychiatric:        Mood and Affect: Mood normal.    ED Results / Procedures / Treatments   Labs (all labs ordered are listed, but only abnormal results are displayed) Labs Reviewed  URINALYSIS, ROUTINE W REFLEX MICROSCOPIC - Abnormal; Notable for the following components:      Result Value   APPearance HAZY (*)    Hgb urine dipstick LARGE (*)    RBC / HPF >50 (*)    All other components within normal limits  BASIC METABOLIC PANEL - Abnormal; Notable for the following components:   Potassium 3.3 (*)    All other components within normal limits  CBC WITH DIFFERENTIAL/PLATELET - Abnormal; Notable for the  following components:   WBC 11.1 (*)    All other components within normal limits    EKG None  Radiology CT Renal Stone Study  Result Date: 06/24/2021 CLINICAL DATA:  Left flank pain, microscopic hematuria, history of nephrolithiasis EXAM: CT ABDOMEN AND PELVIS WITHOUT CONTRAST TECHNIQUE: Multidetector CT imaging of the abdomen and pelvis was performed following the standard protocol without IV contrast. COMPARISON:  07/20/2015 FINDINGS: Lower chest: 5 mm right middle lobe pulmonary nodule within the visualized right lung base is stable since prior examination and safely considered benign. The visualized lung bases are otherwise clear. Extensive right coronary artery calcification. Global cardiac size within normal limits. No pericardial effusion. Hepatobiliary: No focal liver abnormality is seen. No gallstones, gallbladder wall thickening, or biliary dilatation. Pancreas: Unremarkable Spleen: Unremarkable Adrenals/Urinary Tract: The adrenal glands are unremarkable. The kidneys are normal in size and position. An obstructing 2 mm x 3 mm x 5 mm calculus is seen within the proximal left ureter at the level of the transverse process of L4 resulting in mild left hydronephrosis. No additional nephro or urolithiasis noted on the left. Two punctate 1-2 mm nonobstructing calculi are noted within the upper pole the right kidney. No hydronephrosis on the right. The bladder is unremarkable. Stomach/Bowel: Moderate transverse, descending, and sigmoid colonic diverticulosis. The stomach, small bowel, and large bowel are otherwise unremarkable. Appendix normal. No evidence of obstruction or focal inflammation. No free intraperitoneal gas or fluid. Vascular/Lymphatic: Mild aortoiliac atherosclerotic calcification. No aortic aneurysm. No pathologic adenopathy within the abdomen and pelvis. Reproductive: Moderate prostatic enlargement. Seminal vesicles are unremarkable. Other: Tiny fat containing umbilical hernia.  Rectum  unremarkable. Musculoskeletal: No acute bone abnormality. No lytic or blastic bone lesions are identified. IMPRESSION: Obstructing 2 x 3 x 5 mm calculus within the proximal left ureter resulting in mild left hydronephrosis. Minimal nonobstructing right nephrolithiasis. Extensive right coronary artery calcification. Moderate colonic diverticulosis. Moderate prostatic enlargement. Aortic Atherosclerosis (ICD10-I70.0). Electronically Signed   By: Fidela Salisbury M.D.   On: 06/24/2021 21:45    Procedures Procedures   Medications Ordered in ED Medications  tamsulosin (FLOMAX) capsule  0.4 mg (has no administration in time range)    ED Course  I have reviewed the triage vital signs and the nursing notes.  Pertinent labs & imaging results that were available during my care of the patient were reviewed by me and considered in my medical decision making (see chart for details).    MDM Rules/Calculators/A&P                          Initial impression-patient presents to the emerged part chief complaint of left-sided flank pain.  He is alert, does not appear acute chest, vital signs reassuring.  Suspect kidney stone will obtain UA, basic lab work-up, CT imaging and reassess.  Work-up-CBC shows slight leukocytosis 11.1, BMP shows slight hypokalemia 3.3, UA shows many red blood cells.  CT renal reveals obstructing 2 x 3 x 5 mm calcaneus within the proximal left ureter resulting in mild left hydronephrosis.  Reassessment-patient was reassessed, continued no complaints this time.  Patient agreed for discharge.  Rule out-low suspicion for systemic infection as patient is nontoxic-appearing, vital signs reassuring.  He does have slight leukocytosis but I suspect this is secondary due to acute phase reactant from the left ureter stone.  Low suspicion for UTI, pyelonephritis, septic stone as urine is negative for signs infection, CT imaging negative for pyelonephritis.  Low suspicion for aortic dissection as  presentation is atypical of etiology, exam is more consistent with a kidney stone.  Low suspicion for spinal cord abnormality or spinal fracture spine was palpated was nontender to palpation, patient is full range of motion upper lower extremities.  Plan- Left-sided flank pain-likely caused by 5 mm stone, will provide him with pain medications, Flomax, antiemetics, follow-up with urology for further evaluation.  Vital signs have remained stable, no indication for hospital admission.  Patient discussed with attending and they agreed with assessment and plan.  Patient given at home care as well strict return precautions.  Patient verbalized that they understood agreed to said plan.  Final Clinical Impression(s) / ED Diagnoses Final diagnoses:  Ureterolithiasis    Rx / DC Orders ED Discharge Orders          Ordered    oxyCODONE-acetaminophen (PERCOCET/ROXICET) 5-325 MG tablet  Every 8 hours PRN        06/24/21 2244    tamsulosin (FLOMAX) 0.4 MG CAPS capsule  Daily        06/24/21 2245    ondansetron (ZOFRAN) 4 MG tablet  Every 8 hours PRN        06/24/21 2245    oxyCODONE-acetaminophen (PERCOCET/ROXICET) 5-325 MG tablet  Every 8 hours PRN        06/24/21 2248             Marcello Fennel, PA-C 06/24/21 2249    Sherwood Gambler, MD 06/28/21 [phone]

## 2021-06-25 ENCOUNTER — Other Ambulatory Visit: Payer: Self-pay | Admitting: Physician Assistant

## 2021-06-25 MED ORDER — ATORVASTATIN CALCIUM 40 MG PO TABS: 40.0000 mg | ORAL_TABLET | Freq: Every day | ORAL | 4 refills | Status: DC

## 2021-06-25 MED FILL — Oxycodone w/ Acetaminophen Tab 5-325 MG: ORAL | Qty: 6 | Status: AC

## 2021-07-04 ENCOUNTER — Other Ambulatory Visit: Payer: Self-pay

## 2021-07-04 ENCOUNTER — Ambulatory Visit (INDEPENDENT_AMBULATORY_CARE_PROVIDER_SITE_OTHER): Payer: No Typology Code available for payment source | Admitting: Urology

## 2021-07-04 ENCOUNTER — Ambulatory Visit (HOSPITAL_COMMUNITY)
Admission: RE | Admit: 2021-07-04 | Discharge: 2021-07-04 | Disposition: A | Discharge status: Home / Self Care | Payer: Self-pay | Source: Ambulatory Visit | Attending: Urology | Admitting: Urology

## 2021-07-04 ENCOUNTER — Encounter: Payer: Self-pay | Admitting: Urology

## 2021-07-04 VITALS — BP 110/67 | HR 106

## 2021-07-04 DIAGNOSIS — N200 Calculus of kidney: Secondary | ICD-10-CM | POA: Insufficient documentation

## 2021-07-04 LAB — URINALYSIS, ROUTINE W REFLEX MICROSCOPIC
Bilirubin, UA: NEGATIVE
Glucose, UA: NEGATIVE
Leukocytes,UA: NEGATIVE
Nitrite, UA: NEGATIVE
Protein,UA: NEGATIVE
RBC, UA: NEGATIVE
Specific Gravity, UA: 1.025 (ref 1.005–1.030)
Urobilinogen, Ur: 1 mg/dL (ref 0.2–1.0)
pH, UA: 5.5 (ref 5.0–7.5)

## 2021-07-04 IMAGING — DX DG ABDOMEN 1V
2 series · 2 of 2 positions shown · non-contrast
Comparison: None.

CLINICAL DATA: Nephrolithiasis

EXAM:
ABDOMEN - 1 VIEW

[abdomen kub (1 of 2)]
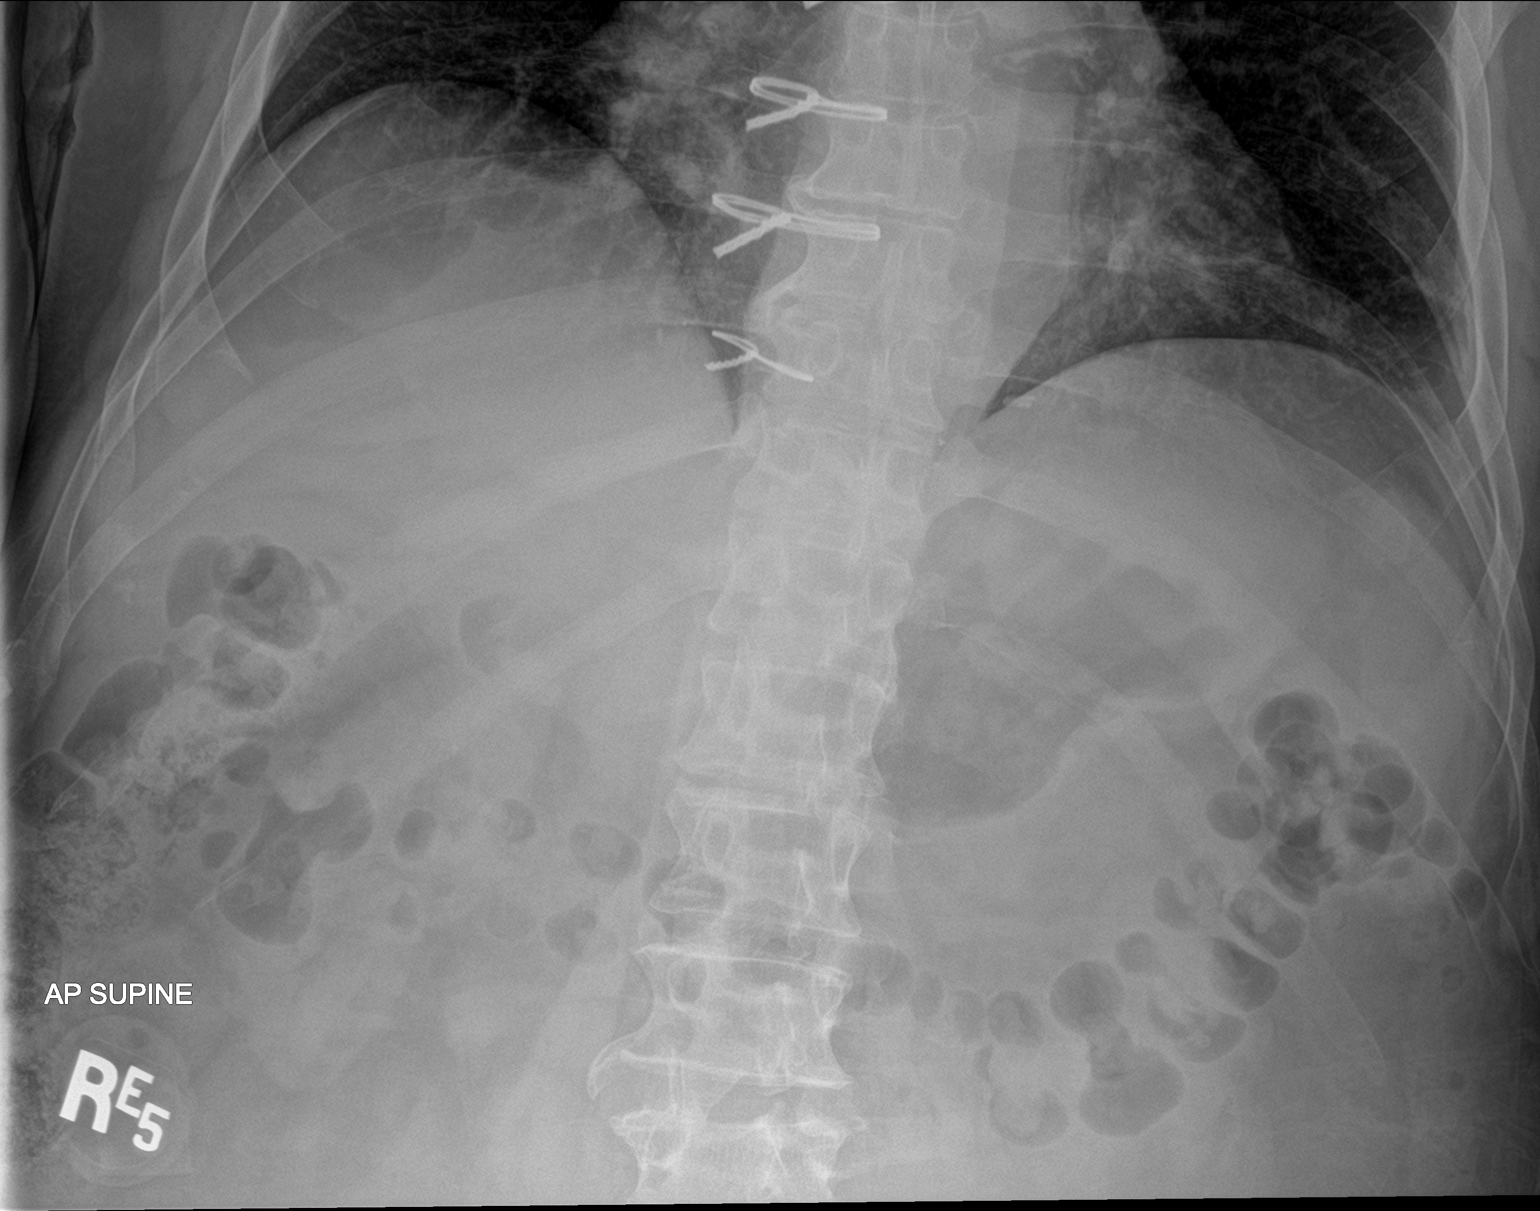

[abdomen kub (2 of 2)]
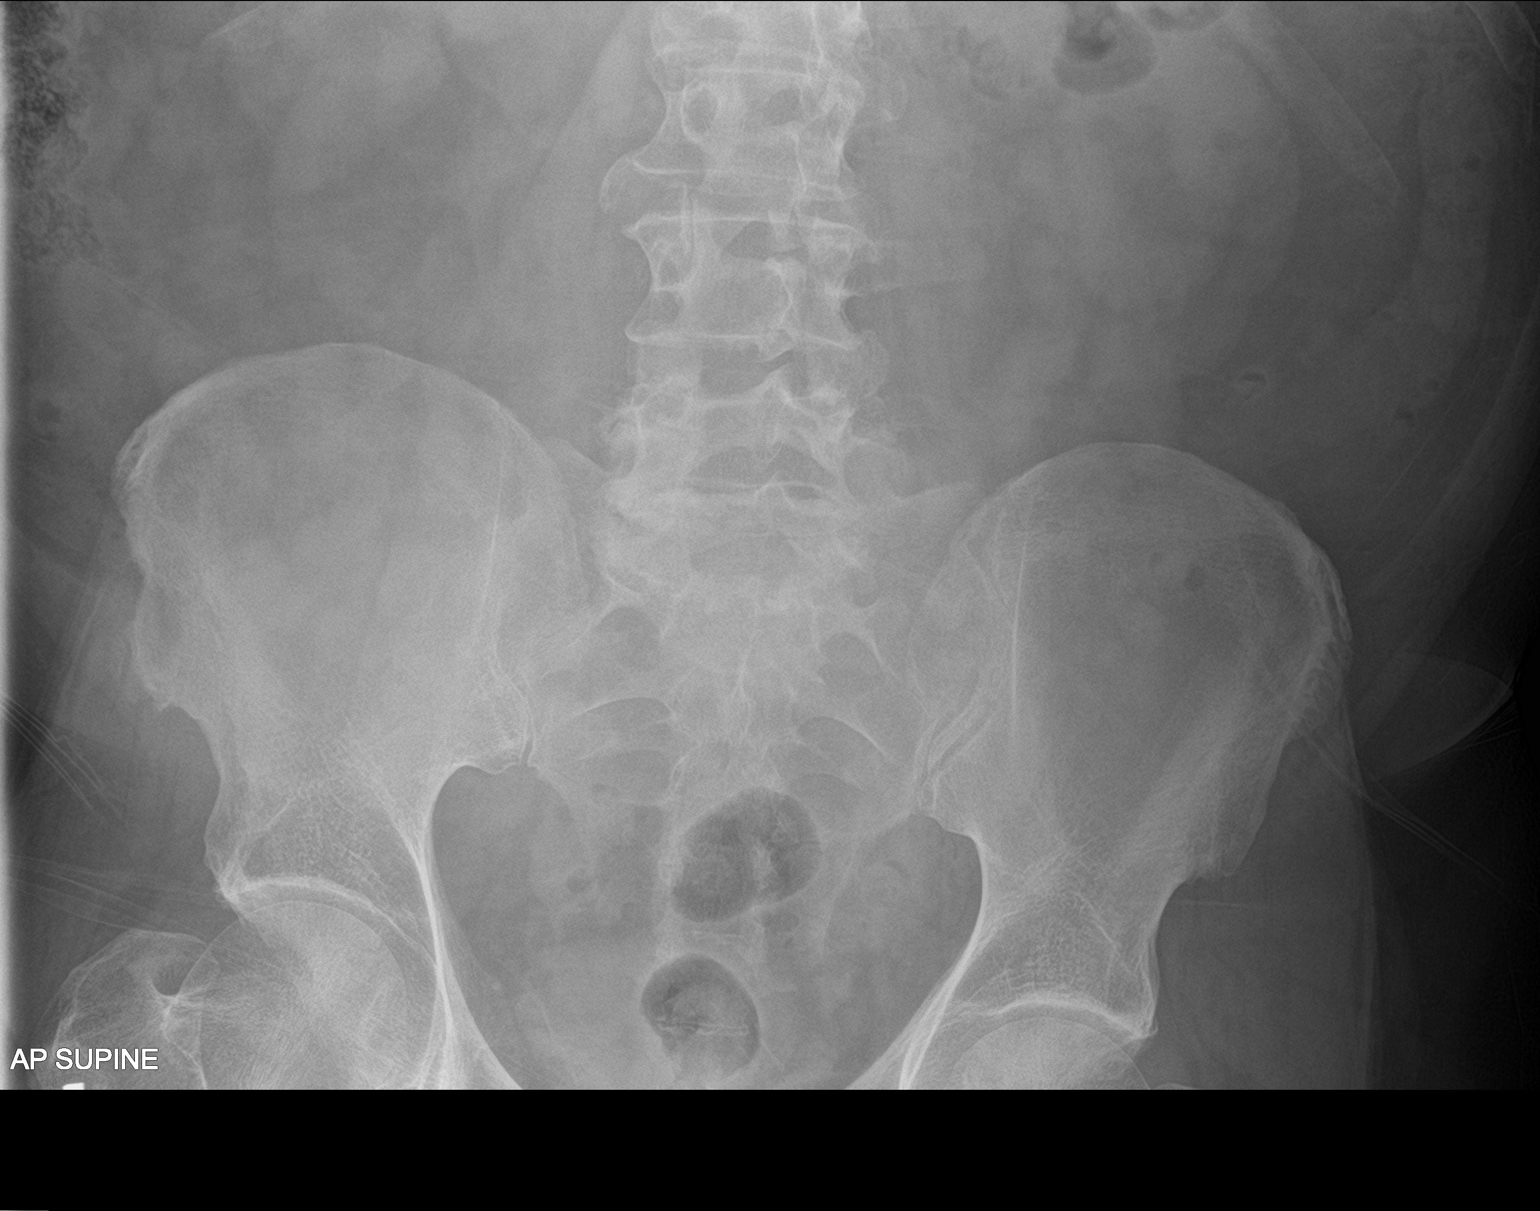

[2 of 2 positions shown; findings below may reference images not displayed]

FINDINGS: The bowel gas pattern is normal. No radio-opaque calculi or other
significant radiographic abnormality are seen.
IMPRESSION: Negative.

## 2021-07-04 MED ORDER — OXYCODONE-ACETAMINOPHEN 5-325 MG PO TABS: 1.0000 | ORAL_TABLET | ORAL | 0 refills | Status: DC | PRN

## 2021-07-04 MED ORDER — ONDANSETRON HCL 4 MG PO TABS: 4.0000 mg | ORAL_TABLET | Freq: Three times a day (TID) | ORAL | 0 refills | Status: DC | PRN

## 2021-07-04 NOTE — Patient Instructions (Signed)
Dietary Guidelines to Help Prevent Kidney Stones Kidney stones are deposits of minerals and salts that form inside your kidneys. Your risk of developing kidney stones may be greater depending on your diet, your lifestyle, the medicines you take, and whether you have certain medical conditions. Most people can lower their chances of developing kidney stones by following the instructions below. Your dietitian may give you more specific instructions depending on your overall health and the type of kidney stones you tend to develop. What are tips for following this plan? Reading food labels  Choose foods with "no salt added" or "low-salt" labels. Limit your salt (sodium) intake to less than 1,500 mg a day. Choose foods with calcium for each meal and snack. Try to eat about 300 mg of calcium at each meal. Foods that contain 200-500 mg of calcium a serving include: 8 oz (237 mL) of milk, calcium-fortifiednon-dairy milk, and calcium-fortifiedfruit juice. Calcium-fortified means that calcium has been added to these drinks. 8 oz (237 mL) of kefir, yogurt, and soy yogurt. 4 oz (114 g) of tofu. 1 oz (28 g) of cheese. 1 cup (150 g) of dried figs. 1 cup (91 g) of cooked broccoli. One 3 oz (85 g) can of sardines or mackerel. Most people need 1,000-1,500 mg of calcium a day. Talk to your dietitian about how much calcium is recommended for you. Shopping Buy plenty of fresh fruits and vegetables. Most people do not need to avoid fruits and vegetables, even if these foods contain nutrients that may contribute to kidney stones. When shopping for convenience foods, choose: Whole pieces of fruit. Pre-made salads with dressing on the side. Low-fat fruit and yogurt smoothies. Avoid buying frozen meals or prepared deli foods. These can be high in sodium. Look for foods with live cultures, such as yogurt and kefir. Choose high-fiber grains, such as whole-wheat breads, oat bran, and wheat cereals. Cooking Do not add  salt to food when cooking. Place a salt shaker on the table and allow each person to add his or her own salt to taste. Use vegetable protein, such as beans, textured vegetable protein (TVP), or tofu, instead of meat in pasta, casseroles, and soups. Meal planning Eat less salt, if told by your dietitian. To do this: Avoid eating processed or pre-made food. Avoid eating fast food. Eat less animal protein, including cheese, meat, poultry, or fish, if told by your dietitian. To do this: Limit the number of times you have meat, poultry, fish, or cheese each week. Eat a diet free of meat at least 2 days a week. Eat only one serving each day of meat, poultry, fish, or seafood. When you prepare animal protein, cut pieces into small portion sizes. For most meat and fish, one serving is about the size of the palm of your hand. Eat at least five servings of fresh fruits and vegetables each day. To do this: Keep fruits and vegetables on hand for snacks. Eat one piece of fruit or a handful of berries with breakfast. Have a salad and fruit at lunch. Have two kinds of vegetables at dinner. Limit foods that are high in a substance called oxalate. These include: Spinach (cooked), rhubarb, beets, sweet potatoes, and Swiss chard. Peanuts. Potato chips, french fries, and baked potatoes with skin on. Nuts and nut products. Chocolate. If you regularly take a diuretic medicine, make sure to eat at least 1 or 2 servings of fruits or vegetables that are high in potassium each day. These include: Avocado. Banana. Orange, prune,   carrot, or tomato juice. Baked potato. Cabbage. Beans and split peas. Lifestyle  Drink enough fluid to keep your urine pale yellow. This is the most important thing you can do. Spread your fluid intake throughout the day. If you drink alcohol: Limit how much you use to: 0-1 drink a day for women who are not pregnant. 0-2 drinks a day for men. Be aware of how much alcohol is in your  drink. In the [place] one drink equals one 12 oz bottle of beer (355 mL), one 5 oz glass of wine (148 mL), or one 1 oz glass of hard liquor (44 mL). Lose weight if told by your health care provider. Work with your dietitian to find an eating plan and weight loss strategies that work best for you. General information Talk to your health care provider and dietitian about taking daily supplements. You may be told the following depending on your health and the cause of your kidney stones: Not to take supplements with vitamin C. To take a calcium supplement. To take a daily probiotic supplement. To take other supplements such as magnesium, fish oil, or vitamin B6. Take over-the-counter and prescription medicines only as told by your health care provider. These include supplements. What foods should I limit? Limit your intake of the following foods, or eat them as told by your dietitian. Vegetables Spinach. Rhubarb. Beets. Canned vegetables. Pickles. Olives. Baked potatoes with skin. Grains Wheat bran. Baked goods. Salted crackers. Cereals high in sugar. Meats and other proteins Nuts. Nut butters. Large portions of meat, poultry, or fish. Salted, precooked, or cured meats, such as sausages, meat loaves, and hot dogs. Dairy Cheese. Beverages Regular soft drinks. Regular vegetable juice. Seasonings and condiments Seasoning blends with salt. Salad dressings. Soy sauce. Ketchup. Barbecue sauce. Other foods Canned soups. Canned pasta sauce. Casseroles. Pizza. Lasagna. Frozen meals. Potato chips. French fries. The items listed above may not be a complete list of foods and beverages you should limit. Contact a dietitian for more information. What foods should I avoid? Talk to your dietitian about specific foods you should avoid based on the type of kidney stones you have and your overall health. Fruits Grapefruit. The item listed above may not be a complete list of foods and beverages you should  avoid. Contact a dietitian for more information. Summary Kidney stones are deposits of minerals and salts that form inside your kidneys. You can lower your risk of kidney stones by making changes to your diet. The most important thing you can do is drink enough fluid. Drink enough fluid to keep your urine pale yellow. Talk to your dietitian about how much calcium you should have each day, and eat less salt and animal protein as told by your dietitian. This information is not intended to replace advice given to you by your health care provider. Make sure you discuss any questions you have with your health care provider. Document Revised: 10/14/2019 Document Reviewed: 10/14/2019 Elsevier Patient Education  2022 [employer]  

## 2021-07-04 NOTE — Progress Notes (Signed)
Urological Symptom Review  Patient is experiencing the following symptoms: Frequent urination Hard to postpone urination Get up at night to urinate Weak stream Erection problems (male only) Kidney stones   Review of Systems  Gastrointestinal (upper)  : Negative for upper GI symptoms  Gastrointestinal (lower) : Negative for lower GI symptoms  Constitutional : Negative for symptoms  Skin: Negative for skin symptoms  Eyes: Negative for eye symptoms  Ear/Nose/Throat : Negative for Ear/Nose/Throat symptoms  Hematologic/Lymphatic: Negative for Hematologic/Lymphatic symptoms  Cardiovascular : Negative for cardiovascular symptoms  Respiratory : Negative for respiratory symptoms  Endocrine: Negative for endocrine symptoms  Musculoskeletal: Back pain Joint pain  Neurological: Negative for neurological symptoms  Psychologic: Anxiety

## 2021-07-04 NOTE — Progress Notes (Signed)
07/04/2021 1:46 PM   Edward Fisher 01-16-1966 [id number]  Referring provider: Soyla Dryer, PA-C [address] [place],  [place] [address]  nephrolithiasis   HPI: [name] is a 55yo here for evaluation of nephrolithiasis. 10 days ago he developed left flank pain and presented to the ER. CT 06/24/2021 shows a 27m left proximal ureteral calculus. His pain was sharp, severe, intermittent and nonraditing. He has not passed his calculus. His last episode of flank pain was 2 days ago and is now radiating to his groin.    PMH: Past Medical History:  Diagnosis Date   Arteriosclerotic cardiovascular disease (ASCVD)    Onset in 04/2012; CABG in 06/2012 w/ LIMA-LAD, L Rad-CFX, SVG-PL-PDA   Hyperlipidemia    goal LDL < 70   Insomnia    Nephrolithiasis     Surgical History: Past Surgical History:  Procedure Laterality Date   CORONARY ARTERY BYPASS GRAFT  06/24/2012   Procedure: CORONARY ARTERY BYPASS GRAFTING (CABG);  Surgeon: EGrace Isaac MD;  Location[facility]  Service: Open Heart Surgery;  Laterality: N/A;  Coronary Artery  Bypass X 4 utilizing endoscopic saphenous vein graft and  Left radial Artery Harvest    CYSTOSCOPY W/ RETROGRADES Left 08/23/2015   Procedure: CYSTOSCOPY WITH RETROGRADE PYELOGRAM;  Surgeon: PCleon Gustin MD;  Location: [facility];  Service: Urology;  Laterality: Left;   CYSTOSCOPY WITH URETEROSCOPY, STONE BASKETRY AND STENT PLACEMENT Left 08/23/2015   Procedure: CYSTOSCOPY WITH URETEROSCOPY, STONE BASKET EXTRACTION;  Surgeon: PCleon Gustin MD;  Location: [facility];  Service: Urology;  Laterality: Left;   HOLMIUM LASER APPLICATION Left [id number]  Procedure: HOLMIUM LASER APPLICATION;  Surgeon: PCleon Gustin MD;  Location: [facility];  Service: Urology;  Laterality: Left;   LEFT HEART CATHETERIZATION WITH CORONARY ANGIOGRAM N/A 06/23/2012   Procedure: LEFT HEART CATHETERIZATION WITH CORONARY ANGIOGRAM;  Surgeon: MSherren Mocha MD;  [facility];  Service: Cardiovascular;  Laterality: N/A;   RHINOPLASTY     TONSILLECTOMY  ~1971    Home Medications:  Allergies as of 07/04/2021       Reactions   Prozac [fluoxetine] Other (See Comments)   "made me shake" intolerance.    Pollen Extract Cough        Medication List        Accurate as of July 04, 2021  1:46 PM. If you have any questions, ask your nurse or doctor.          aspirin 81 MG chewable tablet Chew 81 mg by mouth every evening. TO START DOSAGE IN 6 MONTHS APPROXIMATELY July 2014   atorvastatin 40 MG tablet Commonly known as: LIPITOR Take 1 tablet (40 mg total) by mouth daily.   metoprolol succinate 25 MG 24 hr tablet Commonly known as: TOPROL-XL Take 1 tablet by mouth once daily   nitroGLYCERIN 0.4 MG SL tablet Commonly known as: NITROSTAT Place 1 tablet (0.4 mg total) under the tongue every 5 (five) minutes as needed for chest pain.   omega-3 acid ethyl esters 1 g capsule Commonly known as: LOVAZA Take 1 g by mouth every evening.   ondansetron 4 MG tablet Commonly known as: ZOFRAN Take 1 tablet (4 mg total) by mouth every 8 (eight) hours as needed for nausea or vomiting.   tamsulosin 0.4 MG Caps capsule Commonly known as: FLOMAX Take 1 capsule (0.4 mg total) by mouth daily.   tamsulosin 0.4 MG Caps capsule Commonly known as: FLOMAX Take 1 capsule (0.4  mg total) by mouth daily for 10 days.        Allergies:  Allergies  Allergen Reactions   Prozac [Fluoxetine] Other (See Comments)    "made me shake" intolerance.    Pollen Extract Cough    Family History: Family History  Problem Relation Age of Onset   Hypertension Mother    Ulcers Father        Peptic ulcer disease   Coronary artery disease Father        Stents   Coronary artery disease Brother        CABG    Social History:  reports that he has never smoked. He quit smokeless tobacco use about 25 years ago.  His smokeless tobacco use included chew. He reports that he  does not drink alcohol and does not use drugs.  ROS: All other review of systems were reviewed and are negative except what is noted above in HPI  Physical Exam: BP 110/67   Pulse (!) 106   Constitutional:  Alert and oriented, No acute distress. HEENT: [place] AT, moist mucus membranes.  Trachea midline, no masses. Cardiovascular: No clubbing, cyanosis, or edema. Respiratory: Normal respiratory effort, no increased work of breathing. GI: Abdomen is soft, nontender, nondistended, no abdominal masses GU: No CVA tenderness.  Lymph: No cervical or inguinal lymphadenopathy. Skin: No rashes, bruises or suspicious lesions. Neurologic: Grossly intact, no focal deficits, moving all 4 extremities. Psychiatric: Normal mood and affect.  Laboratory Data: Lab Results  Component Value Date   WBC 11.1 (H) 06/24/2021   HGB 15.5 06/24/2021   HCT 46.6 06/24/2021   MCV 91.0 06/24/2021   PLT 161 06/24/2021    Lab Results  Component Value Date   CREATININE 1.15 06/24/2021    Lab Results  Component Value Date   PSA 1.54 05/27/2016    No results found for: TESTOSTERONE  Lab Results  Component Value Date   HGBA1C 6.1 (H) 11/28/2020    Urinalysis    Component Value Date/Time   COLORURINE YELLOW 06/24/2021 2000   APPEARANCEUR HAZY (A) 06/24/2021 2000   LABSPEC 1.016 06/24/2021 2000   PHURINE 5.0 06/24/2021 2000   GLUCOSEU NEGATIVE 06/24/2021 2000   HGBUR LARGE (A) 06/24/2021 2000   BILIRUBINUR NEGATIVE 06/24/2021 2000   BILIRUBINUR n 08/15/2015 1441   KETONESUR NEGATIVE 06/24/2021 2000   PROTEINUR NEGATIVE 06/24/2021 2000   UROBILINOGEN 0.2 08/15/2015 1441   UROBILINOGEN 0.2 07/20/2015 0857   NITRITE NEGATIVE 06/24/2021 2000   LEUKOCYTESUR NEGATIVE 06/24/2021 2000    Lab Results  Component Value Date   BACTERIA NONE SEEN 06/24/2021    Pertinent Imaging: CT 8/21 and KUB today: Images reviewed and discussed with the patient No results found for this or any previous visit.  No  results found for this or any previous visit.  No results found for this or any previous visit.  No results found for this or any previous visit.  Results for orders placed during the hospital encounter of 03/06/16  US Renal  Narrative CLINICAL DATA:  Ureterolithiasis.  Followup cystoscopy.  EXAM: RENAL / URINARY TRACT ULTRASOUND COMPLETE  COMPARISON:  CT scan 07/20/2015  FINDINGS: Right Kidney:  Length: 12.4 cm. Normal renal cortical thickness. Slight increased echogenicity. No renal lesions, renal calculi or hydronephrosis.  Left Kidney:  Length: 13.0 cm. Normal renal cortical thickness. Slight increased echogenicity. No hydronephrosis. Question renal calculus.  Bladder:  Normal  IMPRESSION: Normal renal cortical thickness bilaterally with slight increased echogenicity. No hydronephrosis.  Possible right  midpole renal calculus.  Normal bladder.   Electronically Signed By: Marijo Sanes M.D. On: 03/06/2016 16:54  No results found for this or any previous visit.  No results found for this or any previous visit.  Results for orders placed during the hospital encounter of 06/24/21  CT Renal Stone Study  Narrative CLINICAL DATA:  Left flank pain, microscopic hematuria, history of nephrolithiasis  EXAM: CT ABDOMEN AND PELVIS WITHOUT CONTRAST  TECHNIQUE: Multidetector CT imaging of the abdomen and pelvis was performed following the standard protocol without IV contrast.  COMPARISON:  07/20/2015  FINDINGS: Lower chest: 5 mm right middle lobe pulmonary nodule within the visualized right lung base is stable since prior examination and safely considered benign. The visualized lung bases are otherwise clear. Extensive right coronary artery calcification. Global cardiac size within normal limits. No pericardial effusion.  Hepatobiliary: No focal liver abnormality is seen. No gallstones, gallbladder wall thickening, or biliary dilatation.  Pancreas:  Unremarkable  Spleen: Unremarkable  Adrenals/Urinary Tract: The adrenal glands are unremarkable. The kidneys are normal in size and position. An obstructing 2 mm x 3 mm x 5 mm calculus is seen within the proximal left ureter at the level of the transverse process of L4 resulting in mild left hydronephrosis. No additional nephro or urolithiasis noted on the left. Two punctate 1-2 mm nonobstructing calculi are noted within the upper pole the right kidney. No hydronephrosis on the right. The bladder is unremarkable.  Stomach/Bowel: Moderate transverse, descending, and sigmoid colonic diverticulosis. The stomach, small bowel, and large bowel are otherwise unremarkable. Appendix normal. No evidence of obstruction or focal inflammation. No free intraperitoneal gas or fluid.  Vascular/Lymphatic: Mild aortoiliac atherosclerotic calcification. No aortic aneurysm. No pathologic adenopathy within the abdomen and pelvis.  Reproductive: Moderate prostatic enlargement. Seminal vesicles are unremarkable.  Other: Tiny fat containing umbilical hernia.  Rectum unremarkable.  Musculoskeletal: No acute bone abnormality. No lytic or blastic bone lesions are identified.  IMPRESSION: Obstructing 2 x 3 x 5 mm calculus within the proximal left ureter resulting in mild left hydronephrosis.  Minimal nonobstructing right nephrolithiasis.  Extensive right coronary artery calcification.  Moderate colonic diverticulosis.  Moderate prostatic enlargement.  Aortic Atherosclerosis (ICD10-I70.0).   Electronically Signed By: Fidela Salisbury M.D. On: 06/24/2021 21:45   Assessment & Plan:    1. Kidney stones -We discussed the management of kidney stones. These options include observation, ureteroscopy, shockwave lithotripsy (ESWL) and percutaneous nephrolithotomy (PCNL). We discussed which options are relevant to the patient's stone(s). We discussed the natural history of kidney stones as well as the  complications of untreated stones and the impact on quality of life without treatment as well as with each of the above listed treatments. We also discussed the efficacy of each treatment in its ability to clear the stone burden. With any of these management options I discussed the signs and symptoms of infection and the need for emergent treatment should these be experienced. For each option we discussed the ability of each procedure to clear the patient of their stone burden.   For observation I described the risks which include but are not limited to silent renal damage, life-threatening infection, need for emergent surgery, failure to pass stone and pain.   For ureteroscopy I described the risks which include bleeding, infection, damage to contiguous structures, positioning injury, ureteral stricture, ureteral avulsion, ureteral injury, need for prolonged ureteral stent, inability to perform ureteroscopy, need for an interval procedure, inability to clear stone burden, stent discomfort/pain, heart attack, stroke,  pulmonary embolus and the inherent risks with general anesthesia.   For shockwave lithotripsy I described the risks which include arrhythmia, kidney contusion, kidney hemorrhage, need for transfusion, pain, inability to adequately break up stone, inability to pass stone fragments, Steinstrasse, infection associated with obstructing stones, need for alternate surgical procedure, need for repeat shockwave lithotripsy, MI, CVA, PE and the inherent risks with anesthesia/conscious sedation.   For PCNL I described the risks including positioning injury, pneumothorax, hydrothorax, need for chest tube, inability to clear stone burden, renal laceration, arterial venous fistula or malformation, need for embolization of kidney, loss of kidney or renal function, need for repeat procedure, need for prolonged nephrostomy tube, ureteral avulsion, MI, CVA, PE and the inherent risks of general anesthesia.   -  The patient would like to proceed with medical expulsive therapy.  - Urinalysis, Routine w reflex microscopic   No follow-ups on file.  Nicolette Bang, MD  [facility] [place]

## 2021-07-11 ENCOUNTER — Other Ambulatory Visit: Payer: Self-pay | Admitting: Physician Assistant

## 2021-07-11 MED ORDER — METOPROLOL SUCCINATE ER 25 MG PO TB24: 25.0000 mg | ORAL_TABLET | Freq: Every day | ORAL | 6 refills | Status: DC

## 2021-07-20 ENCOUNTER — Encounter: Payer: Self-pay | Admitting: Urology

## 2021-07-20 ENCOUNTER — Ambulatory Visit (INDEPENDENT_AMBULATORY_CARE_PROVIDER_SITE_OTHER): Payer: No Typology Code available for payment source | Admitting: Urology

## 2021-07-20 ENCOUNTER — Ambulatory Visit (HOSPITAL_COMMUNITY)
Admission: RE | Admit: 2021-07-20 | Discharge: 2021-07-20 | Disposition: A | Discharge status: Home / Self Care | Payer: No Typology Code available for payment source | Source: Ambulatory Visit | Attending: Urology | Admitting: Urology

## 2021-07-20 ENCOUNTER — Other Ambulatory Visit: Payer: Self-pay

## 2021-07-20 VITALS — BP 107/70 | HR 67

## 2021-07-20 DIAGNOSIS — N200 Calculus of kidney: Secondary | ICD-10-CM

## 2021-07-20 LAB — URINALYSIS, ROUTINE W REFLEX MICROSCOPIC
Bilirubin, UA: NEGATIVE
Glucose, UA: NEGATIVE
Ketones, UA: NEGATIVE
Leukocytes,UA: NEGATIVE
Nitrite, UA: NEGATIVE
Protein,UA: NEGATIVE
RBC, UA: NEGATIVE
Specific Gravity, UA: >1.03 — ABNORMAL HIGH (ref 1.005–1.030)
Urobilinogen, Ur: 1 mg/dL (ref 0.2–1.0)
pH, UA: 6 (ref 5.0–7.5)

## 2021-07-20 IMAGING — DX DG ABDOMEN 1V
2 series · 2 of 2 positions shown · non-contrast
Comparison: None.

CLINICAL DATA: Nephrolithiasis.

EXAM:
ABDOMEN - 1 VIEW

[abdomen kub (1 of 2)]
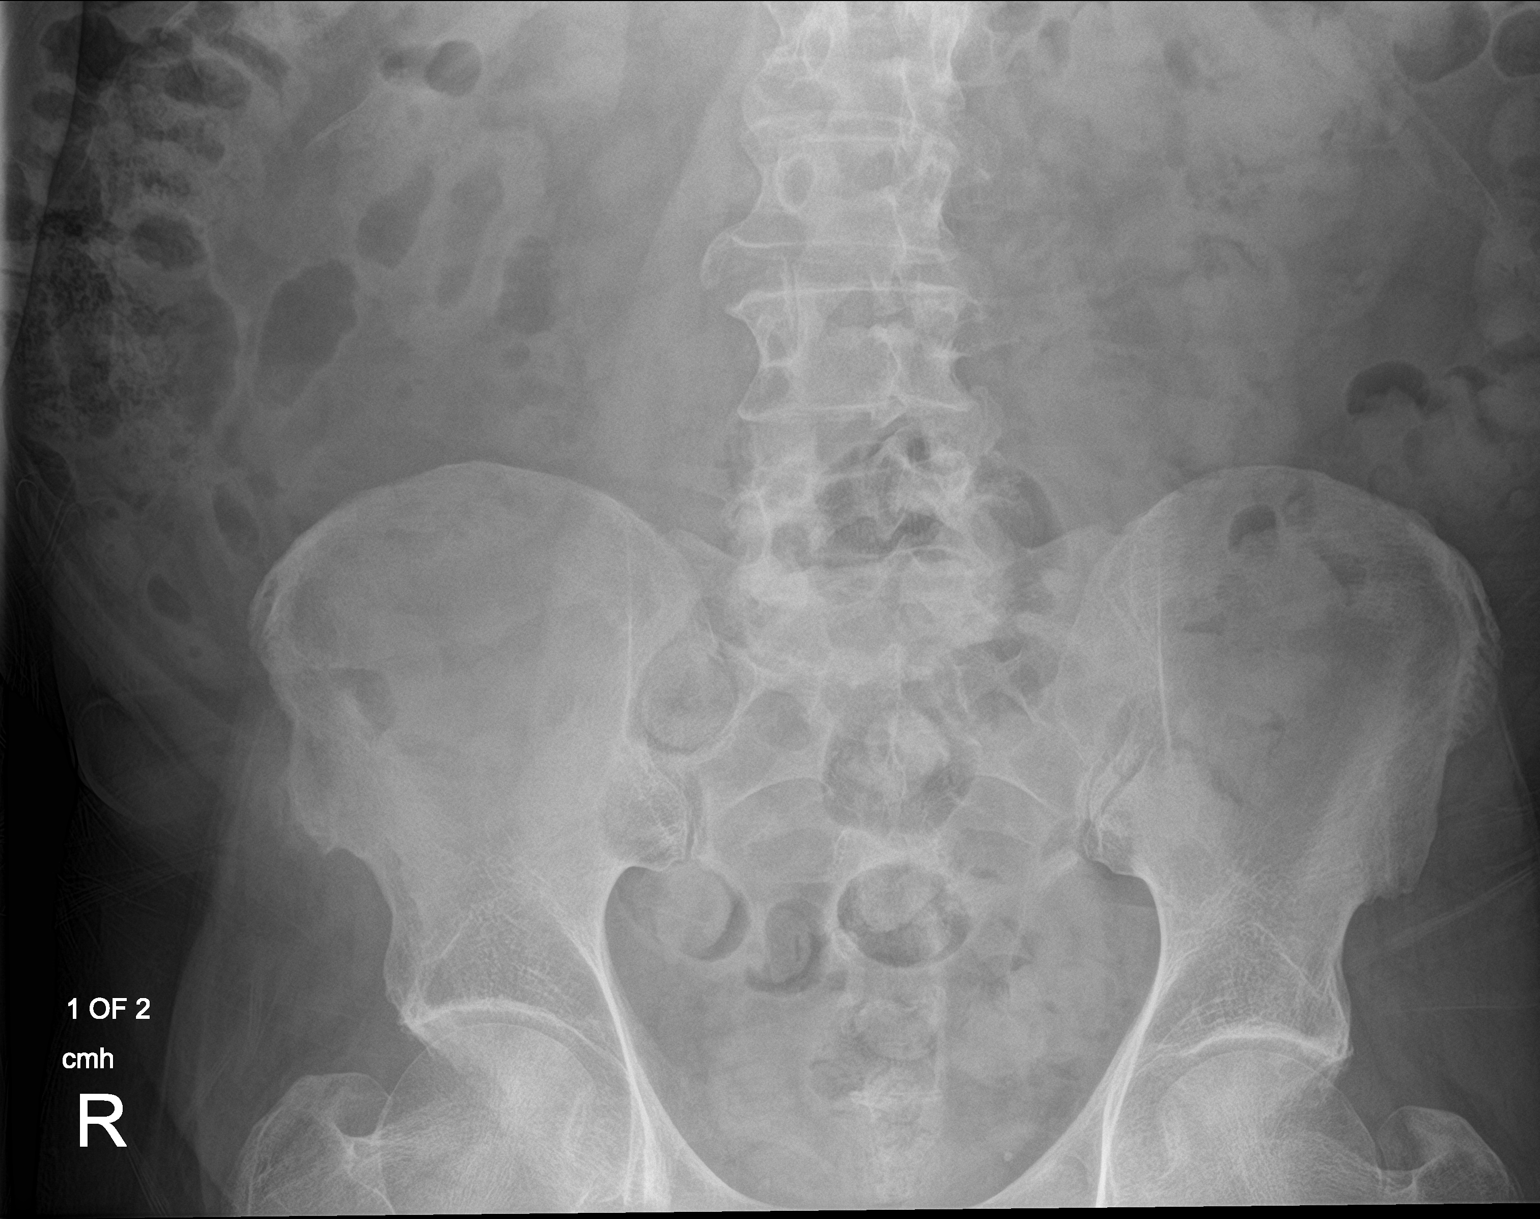

[abdomen kub (2 of 2)]
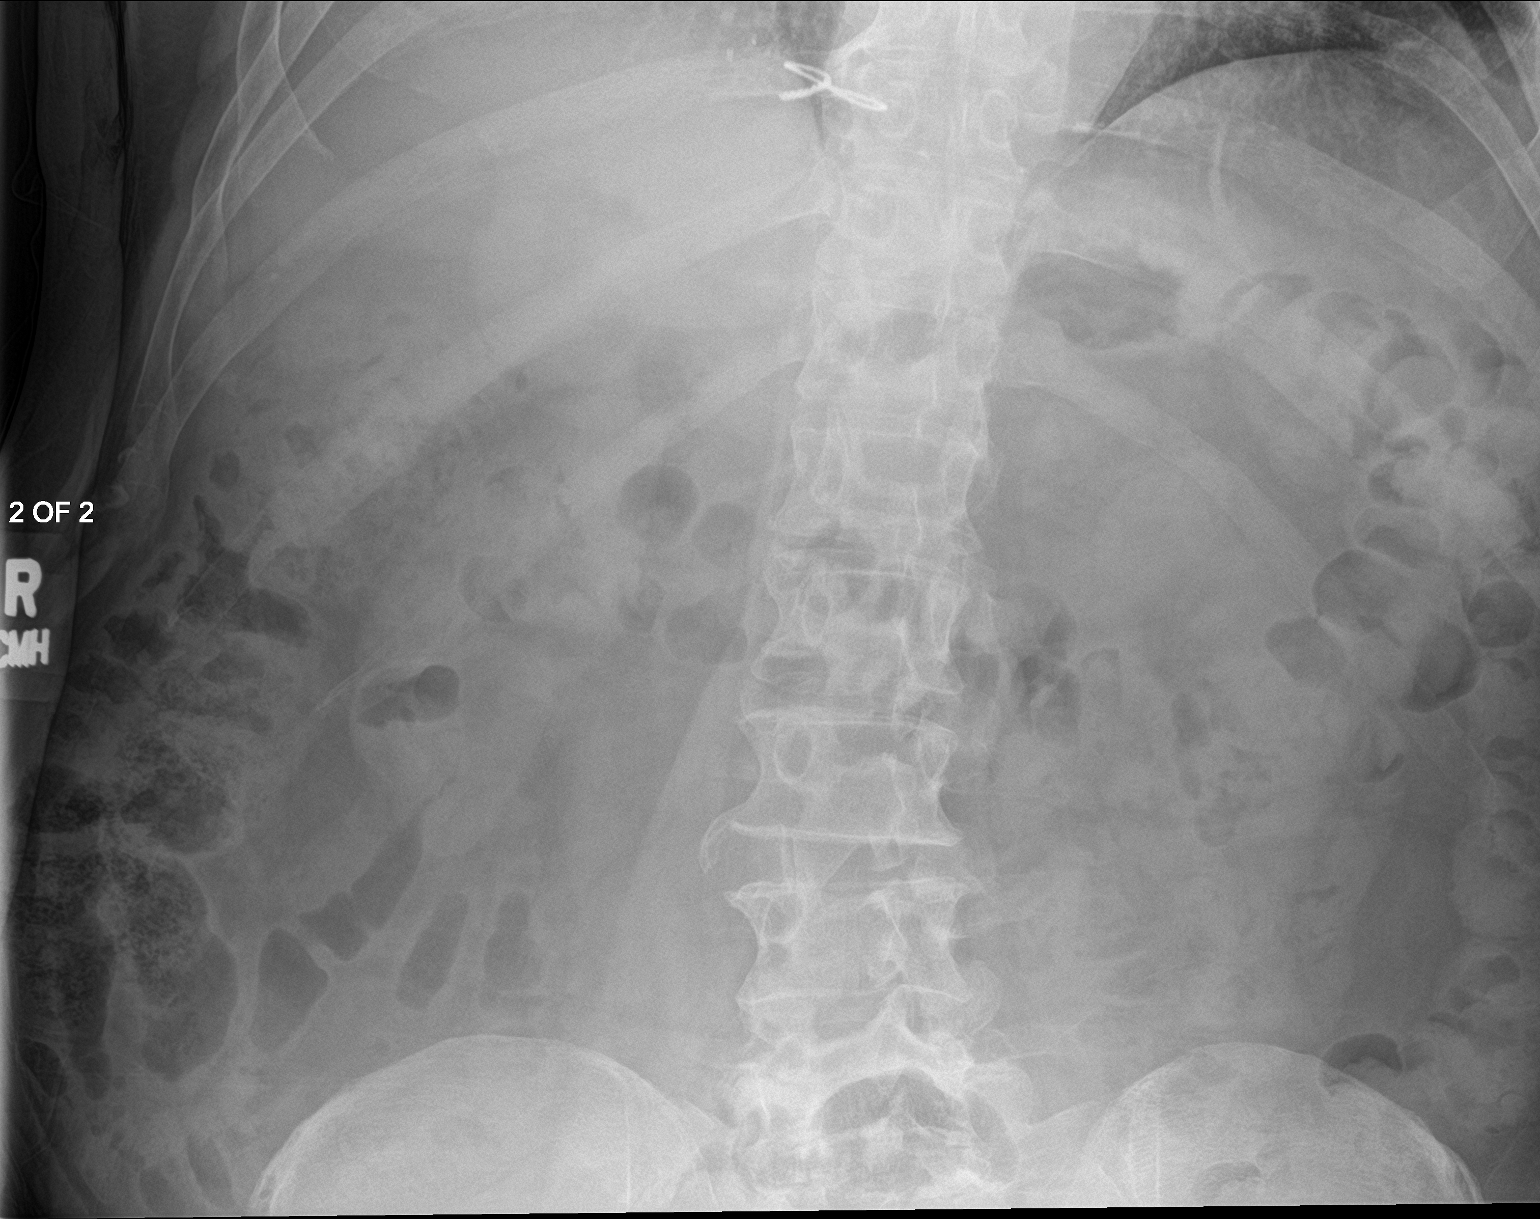

[2 of 2 positions shown; findings below may reference images not displayed]

FINDINGS: Limited evaluation due to overlapping osseous structures and
overlying soft tissues. The bowel gas pattern is normal. No
radio-opaque calculi or other significant radiographic abnormality
are seen. Degenerative changes of the lumbar spine.
IMPRESSION: Negative.

## 2021-07-20 NOTE — Patient Instructions (Signed)
Dietary Guidelines to Help Prevent Kidney Stones Kidney stones are deposits of minerals and salts that form inside your kidneys. Your risk of developing kidney stones may be greater depending on your diet, your lifestyle, the medicines you take, and whether you have certain medical conditions. Most people can lower their chances of developing kidney stones by following the instructions below. Your dietitian may give you more specific instructions depending on your overall health and the type of kidney stones you tend to develop. What are tips for following this plan? Reading food labels  Choose foods with "no salt added" or "low-salt" labels. Limit your salt (sodium) intake to less than 1,500 mg a day. Choose foods with calcium for each meal and snack. Try to eat about 300 mg of calcium at each meal. Foods that contain 200-500 mg of calcium a serving include: 8 oz (237 mL) of milk, calcium-fortifiednon-dairy milk, and calcium-fortifiedfruit juice. Calcium-fortified means that calcium has been added to these drinks. 8 oz (237 mL) of kefir, yogurt, and soy yogurt. 4 oz (114 g) of tofu. 1 oz (28 g) of cheese. 1 cup (150 g) of dried figs. 1 cup (91 g) of cooked broccoli. One 3 oz (85 g) can of sardines or mackerel. Most people need 1,000-1,500 mg of calcium a day. Talk to your dietitian about how much calcium is recommended for you. Shopping Buy plenty of fresh fruits and vegetables. Most people do not need to avoid fruits and vegetables, even if these foods contain nutrients that may contribute to kidney stones. When shopping for convenience foods, choose: Whole pieces of fruit. Pre-made salads with dressing on the side. Low-fat fruit and yogurt smoothies. Avoid buying frozen meals or prepared deli foods. These can be high in sodium. Look for foods with live cultures, such as yogurt and kefir. Choose high-fiber grains, such as whole-wheat breads, oat bran, and wheat cereals. Cooking Do not add  salt to food when cooking. Place a salt shaker on the table and allow each person to add his or her own salt to taste. Use vegetable protein, such as beans, textured vegetable protein (TVP), or tofu, instead of meat in pasta, casseroles, and soups. Meal planning Eat less salt, if told by your dietitian. To do this: Avoid eating processed or pre-made food. Avoid eating fast food. Eat less animal protein, including cheese, meat, poultry, or fish, if told by your dietitian. To do this: Limit the number of times you have meat, poultry, fish, or cheese each week. Eat a diet free of meat at least 2 days a week. Eat only one serving each day of meat, poultry, fish, or seafood. When you prepare animal protein, cut pieces into small portion sizes. For most meat and fish, one serving is about the size of the palm of your hand. Eat at least five servings of fresh fruits and vegetables each day. To do this: Keep fruits and vegetables on hand for snacks. Eat one piece of fruit or a handful of berries with breakfast. Have a salad and fruit at lunch. Have two kinds of vegetables at dinner. Limit foods that are high in a substance called oxalate. These include: Spinach (cooked), rhubarb, beets, sweet potatoes, and Swiss chard. Peanuts. Potato chips, french fries, and baked potatoes with skin on. Nuts and nut products. Chocolate. If you regularly take a diuretic medicine, make sure to eat at least 1 or 2 servings of fruits or vegetables that are high in potassium each day. These include: Avocado. Banana. Orange, prune,   carrot, or tomato juice. Baked potato. Cabbage. Beans and split peas. Lifestyle  Drink enough fluid to keep your urine pale yellow. This is the most important thing you can do. Spread your fluid intake throughout the day. If you drink alcohol: Limit how much you use to: 0-1 drink a day for women who are not pregnant. 0-2 drinks a day for men. Be aware of how much alcohol is in your  drink. In the [place] one drink equals one 12 oz bottle of beer (355 mL), one 5 oz glass of wine (148 mL), or one 1 oz glass of hard liquor (44 mL). Lose weight if told by your health care provider. Work with your dietitian to find an eating plan and weight loss strategies that work best for you. General information Talk to your health care provider and dietitian about taking daily supplements. You may be told the following depending on your health and the cause of your kidney stones: Not to take supplements with vitamin C. To take a calcium supplement. To take a daily probiotic supplement. To take other supplements such as magnesium, fish oil, or vitamin B6. Take over-the-counter and prescription medicines only as told by your health care provider. These include supplements. What foods should I limit? Limit your intake of the following foods, or eat them as told by your dietitian. Vegetables Spinach. Rhubarb. Beets. Canned vegetables. Pickles. Olives. Baked potatoes with skin. Grains Wheat bran. Baked goods. Salted crackers. Cereals high in sugar. Meats and other proteins Nuts. Nut butters. Large portions of meat, poultry, or fish. Salted, precooked, or cured meats, such as sausages, meat loaves, and hot dogs. Dairy Cheese. Beverages Regular soft drinks. Regular vegetable juice. Seasonings and condiments Seasoning blends with salt. Salad dressings. Soy sauce. Ketchup. Barbecue sauce. Other foods Canned soups. Canned pasta sauce. Casseroles. Pizza. Lasagna. Frozen meals. Potato chips. French fries. The items listed above may not be a complete list of foods and beverages you should limit. Contact a dietitian for more information. What foods should I avoid? Talk to your dietitian about specific foods you should avoid based on the type of kidney stones you have and your overall health. Fruits Grapefruit. The item listed above may not be a complete list of foods and beverages you should  avoid. Contact a dietitian for more information. Summary Kidney stones are deposits of minerals and salts that form inside your kidneys. You can lower your risk of kidney stones by making changes to your diet. The most important thing you can do is drink enough fluid. Drink enough fluid to keep your urine pale yellow. Talk to your dietitian about how much calcium you should have each day, and eat less salt and animal protein as told by your dietitian. This information is not intended to replace advice given to you by your health care provider. Make sure you discuss any questions you have with your health care provider. Document Revised: 10/14/2019 Document Reviewed: 10/14/2019 Elsevier Patient Education  2022 [employer]  

## 2021-07-20 NOTE — Progress Notes (Signed)
Urological Symptom Review  Patient is experiencing the following symptoms: Frequent urination Get up at night to urinate Kidney stones   Review of Systems  Gastrointestinal (upper)  : Negative for upper GI symptoms  Gastrointestinal (lower) : Constipation  Constitutional : Negative for symptoms  Skin: Negative for skin symptoms  Eyes: Negative for eye symptoms  Ear/Nose/Throat : Negative for Ear/Nose/Throat symptoms  Hematologic/Lymphatic: Negative for Hematologic/Lymphatic symptoms  Cardiovascular : Negative for cardiovascular symptoms  Respiratory : Negative for respiratory symptoms  Endocrine: Negative for endocrine symptoms  Musculoskeletal: Back pain Joint pain  Neurological: Negative for neurological symptoms  Psychologic: anxiety

## 2021-07-20 NOTE — Progress Notes (Signed)
07/20/2021 11:15 AM   Edward Fisher 1966-05-22 [id number]  Referring provider: Soyla Dryer, PA-C [place],  [place] [address]  Followup nephrolithiasis   HPI: Edward Fisher is a 55yo here for followup nephrolithiasis. He does not know is passed his left ureteral calculus. He he intermittent mild left flank pain. No worsening LUTS. No hematuria or dysuria. KUB from today shows no calculi. NO other complaints today.    PMH: Past Medical History:  Diagnosis Date   Arteriosclerotic cardiovascular disease (ASCVD)    Onset in 04/2012; CABG in 06/2012 w/ LIMA-LAD, L Rad-CFX, SVG-PL-PDA   Hyperlipidemia    goal LDL < 70   Insomnia    Nephrolithiasis     Surgical History: Past Surgical History:  Procedure Laterality Date   CORONARY ARTERY BYPASS GRAFT  06/24/2012   Procedure: CORONARY ARTERY BYPASS GRAFTING (CABG);  Surgeon: [name], MD;  Location: [facility];  Service: Open Heart Surgery;  Laterality: N/A;  Coronary Artery  Bypass X 4 utilizing endoscopic saphenous vein graft and  Left radial Artery Harvest    CYSTOSCOPY W/ RETROGRADES Left 08/23/2015   Procedure: CYSTOSCOPY WITH RETROGRADE PYELOGRAM;  Surgeon: [name], MD;  Location[facility];  Service: Urology;  Laterality: Left;   CYSTOSCOPY WITH URETEROSCOPY, STONE BASKETRY AND STENT PLACEMENT Left 08/23/2015   Procedure: CYSTOSCOPY WITH URETEROSCOPY, STONE BASKET EXTRACTION;  Surgeon: [name], MD;  Location: [facility];  Service: Urology;  Laterality: Left;   HOLMIUM LASER APPLICATION Left [id number]   Procedure: HOLMIUM LASER APPLICATION;  Surgeon: [name], MD;  Location: [facility];  Service: Urology;  Laterality: Left;   LEFT HEART CATHETERIZATION WITH CORONARY ANGIOGRAM N/A 06/23/2012   Procedure: LEFT HEART CATHETERIZATION WITH CORONARY ANGIOGRAM;  Surgeon: [name], MD;  [facility];  Service: Cardiovascular;  Laterality: N/A;   RHINOPLASTY     TONSILLECTOMY  ~1971     Home Medications:  Allergies as of 07/20/2021       Reactions   Prozac [fluoxetine] Other (See Comments)   "made me shake" intolerance.    Pollen Extract Cough        Medication List        Accurate as of July 20, 2021 11:15 AM. If you have any questions, ask your nurse or doctor.          aspirin 81 MG chewable tablet Chew 81 mg by mouth every evening. TO START DOSAGE IN 6 MONTHS APPROXIMATELY July 2014   atorvastatin 40 MG tablet Commonly known as: LIPITOR Take 1 tablet (40 mg total) by mouth daily.   metoprolol succinate 25 MG 24 hr tablet Commonly known as: TOPROL-XL Take 1 tablet (25 mg total) by mouth daily.   nitroGLYCERIN 0.4 MG SL tablet Commonly known as: NITROSTAT Place 1 tablet (0.4 mg total) under the tongue every 5 (five) minutes as needed for chest pain.   omega-3 acid ethyl esters 1 g capsule Commonly known as: LOVAZA Take 1 g by mouth every evening.   ondansetron 4 MG tablet Commonly known as: ZOFRAN Take 1 tablet (4 mg total) by mouth every 8 (eight) hours as needed for nausea or vomiting.   oxyCODONE-acetaminophen 5-325 MG tablet Commonly known as: Percocet Take 1 tablet by mouth every 4 (four) hours as needed.   tamsulosin 0.4 MG Caps capsule Commonly known as: FLOMAX Take 1 capsule (0.4 mg total) by mouth daily.        Allergies:  Allergies  Allergen  Reactions   Prozac [Fluoxetine] Other (See Comments)    "made me shake" intolerance.    Pollen Extract Cough    Family History: Family History  Problem Relation Age of Onset   Hypertension Mother    Ulcers Father        Peptic ulcer disease   Coronary artery disease Father        Stents   Coronary artery disease Brother        CABG    Social History:  reports that he has never smoked. He quit smokeless tobacco use about 25 years ago.  His smokeless tobacco use included chew. He reports that he does not drink alcohol and does not use drugs.  ROS: All other review  of systems were reviewed and are negative except what is noted above in HPI  Physical Exam: BP 107/70   Pulse 67   Constitutional:  Alert and oriented, No acute distress. HEENT: [place] AT, moist mucus membranes.  Trachea midline, no masses. Cardiovascular: No clubbing, cyanosis, or edema. Respiratory: Normal respiratory effort, no increased work of breathing. GI: Abdomen is soft, nontender, nondistended, no abdominal masses GU: No CVA tenderness.  Lymph: No cervical or inguinal lymphadenopathy. Skin: No rashes, bruises or suspicious lesions. Neurologic: Grossly intact, no focal deficits, moving all 4 extremities. Psychiatric: Normal mood and affect.  Laboratory Data: Lab Results  Component Value Date   WBC 11.1 (H) 06/24/2021   HGB 15.5 06/24/2021   HCT 46.6 06/24/2021   MCV 91.0 06/24/2021   PLT 161 06/24/2021    Lab Results  Component Value Date   CREATININE 1.15 06/24/2021    Lab Results  Component Value Date   PSA 1.54 05/27/2016    No results found for: TESTOSTERONE  Lab Results  Component Value Date   HGBA1C 6.1 (H) 11/28/2020    Urinalysis    Component Value Date/Time   COLORURINE YELLOW 06/24/2021 2000   APPEARANCEUR Clear 07/04/2021 1346   LABSPEC 1.016 06/24/2021 2000   PHURINE 5.0 06/24/2021 2000   GLUCOSEU Negative 07/04/2021 1346   HGBUR LARGE (A) 06/24/2021 2000   BILIRUBINUR Negative 07/04/2021 1346   KETONESUR NEGATIVE 06/24/2021 2000   PROTEINUR Negative 07/04/2021 1346   PROTEINUR NEGATIVE 06/24/2021 2000   UROBILINOGEN 0.2 08/15/2015 1441   UROBILINOGEN 0.2 07/20/2015 0857   NITRITE Negative 07/04/2021 1346   NITRITE NEGATIVE 06/24/2021 2000   LEUKOCYTESUR Negative 07/04/2021 1346   LEUKOCYTESUR NEGATIVE 06/24/2021 2000    Lab Results  Component Value Date   LABMICR Comment 07/04/2021   BACTERIA NONE SEEN 06/24/2021    Pertinent Imaging: KUb 07/20/2021: Images reviewed and discussed with the patient Results for orders placed in  visit on 07/04/21  Abdomen 1 view (KUB)  Narrative CLINICAL DATA:  Nephrolithiasis  EXAM: ABDOMEN - 1 VIEW  COMPARISON:  None.  FINDINGS: The bowel gas pattern is normal. No radio-opaque calculi or other significant radiographic abnormality are seen.  IMPRESSION: Negative.   Electronically Signed By: Rolm Baptise M.D. On: 07/06/2021 23:31  No results found for this or any previous visit.  No results found for this or any previous visit.  No results found for this or any previous visit.  Results for orders placed during the hospital encounter of 03/06/16  US Renal  Narrative CLINICAL DATA:  Ureterolithiasis.  Followup cystoscopy.  EXAM: RENAL / URINARY TRACT ULTRASOUND COMPLETE  COMPARISON:  CT scan 07/20/2015  FINDINGS: Right Kidney:  Length: 12.4 cm. Normal renal cortical thickness. Slight increased echogenicity. No  renal lesions, renal calculi or hydronephrosis.  Left Kidney:  Length: 13.0 cm. Normal renal cortical thickness. Slight increased echogenicity. No hydronephrosis. Question renal calculus.  Bladder:  Normal  IMPRESSION: Normal renal cortical thickness bilaterally with slight increased echogenicity. No hydronephrosis.  Possible right midpole renal calculus.  Normal bladder.   Electronically Signed By: Marijo Sanes M.D. On: 03/06/2016 16:54  No results found for this or any previous visit.  No results found for this or any previous visit.  Results for orders placed during the hospital encounter of 06/24/21  CT Renal Stone Study  Narrative CLINICAL DATA:  Left flank pain, microscopic hematuria, history of nephrolithiasis  EXAM: CT ABDOMEN AND PELVIS WITHOUT CONTRAST  TECHNIQUE: Multidetector CT imaging of the abdomen and pelvis was performed following the standard protocol without IV contrast.  COMPARISON:  07/20/2015  FINDINGS: Lower chest: 5 mm right middle lobe pulmonary nodule within the visualized right lung  base is stable since prior examination and safely considered benign. The visualized lung bases are otherwise clear. Extensive right coronary artery calcification. Global cardiac size within normal limits. No pericardial effusion.  Hepatobiliary: No focal liver abnormality is seen. No gallstones, gallbladder wall thickening, or biliary dilatation.  Pancreas: Unremarkable  Spleen: Unremarkable  Adrenals/Urinary Tract: The adrenal glands are unremarkable. The kidneys are normal in size and position. An obstructing 2 mm x 3 mm x 5 mm calculus is seen within the proximal left ureter at the level of the transverse process of L4 resulting in mild left hydronephrosis. No additional nephro or urolithiasis noted on the left. Two punctate 1-2 mm nonobstructing calculi are noted within the upper pole the right kidney. No hydronephrosis on the right. The bladder is unremarkable.  Stomach/Bowel: Moderate transverse, descending, and sigmoid colonic diverticulosis. The stomach, small bowel, and large bowel are otherwise unremarkable. Appendix normal. No evidence of obstruction or focal inflammation. No free intraperitoneal gas or fluid.  Vascular/Lymphatic: Mild aortoiliac atherosclerotic calcification. No aortic aneurysm. No pathologic adenopathy within the abdomen and pelvis.  Reproductive: Moderate prostatic enlargement. Seminal vesicles are unremarkable.  Other: Tiny fat containing umbilical hernia.  Rectum unremarkable.  Musculoskeletal: No acute bone abnormality. No lytic or blastic bone lesions are identified.  IMPRESSION: Obstructing 2 x 3 x 5 mm calculus within the proximal left ureter resulting in mild left hydronephrosis.  Minimal nonobstructing right nephrolithiasis.  Extensive right coronary artery calcification.  Moderate colonic diverticulosis.  Moderate prostatic enlargement.  Aortic Atherosclerosis (ICD10-I70.0).   Electronically Signed By: Fidela Salisbury  M.D. On: 06/24/2021 21:45   Assessment & Plan:    1. Kidney stones -RTC 4-6 weeks with renal US - Urinalysis, Routine w reflex microscopic   No follow-ups on file.  Nicolette Bang, MD  [facility] [place]

## 2021-08-17 ENCOUNTER — Ambulatory Visit (HOSPITAL_COMMUNITY)
Admission: RE | Admit: 2021-08-17 | Discharge: 2021-08-17 | Disposition: A | Discharge status: Home / Self Care | Payer: No Typology Code available for payment source | Source: Ambulatory Visit | Attending: Urology | Admitting: Urology

## 2021-08-17 ENCOUNTER — Other Ambulatory Visit: Payer: Self-pay

## 2021-08-17 ENCOUNTER — Ambulatory Visit (INDEPENDENT_AMBULATORY_CARE_PROVIDER_SITE_OTHER): Payer: No Typology Code available for payment source | Admitting: Urology

## 2021-08-17 ENCOUNTER — Encounter: Payer: Self-pay | Admitting: Urology

## 2021-08-17 VITALS — BP 138/79 | HR 61 | Temp 98.5°F | Wt 202.0 lb

## 2021-08-17 DIAGNOSIS — N200 Calculus of kidney: Secondary | ICD-10-CM

## 2021-08-17 LAB — URINALYSIS, ROUTINE W REFLEX MICROSCOPIC
Bilirubin, UA: NEGATIVE
Glucose, UA: NEGATIVE
Ketones, UA: NEGATIVE
Leukocytes,UA: NEGATIVE
Nitrite, UA: NEGATIVE
Protein,UA: NEGATIVE
RBC, UA: NEGATIVE
Specific Gravity, UA: 1.025 (ref 1.005–1.030)
Urobilinogen, Ur: 1 mg/dL (ref 0.2–1.0)
pH, UA: 5.5 (ref 5.0–7.5)

## 2021-08-17 IMAGING — US US RENAL
1 series · 13 of 25 positions shown · non-contrast
Comparison: CT scan [DATE]

CLINICAL DATA: Nephrolithiasis

EXAM:
RENAL / URINARY TRACT ULTRASOUND COMPLETE

[Series 1: us renal · 13 of 48 slices shown]
[im 1/48]
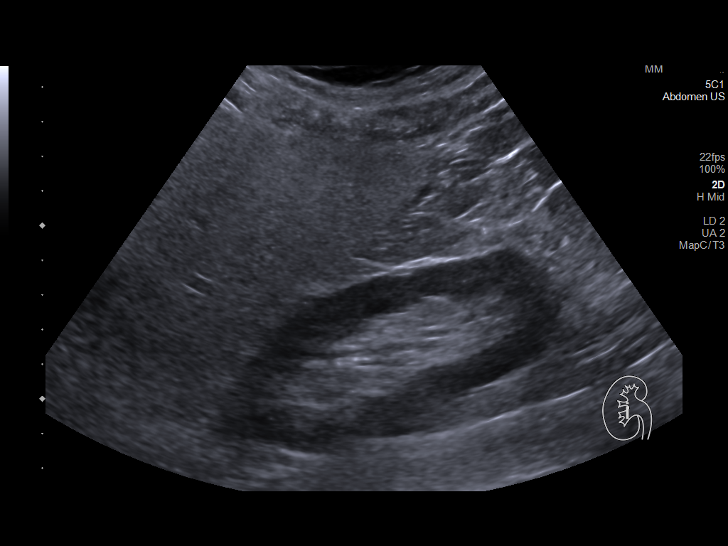
[im 4/48]
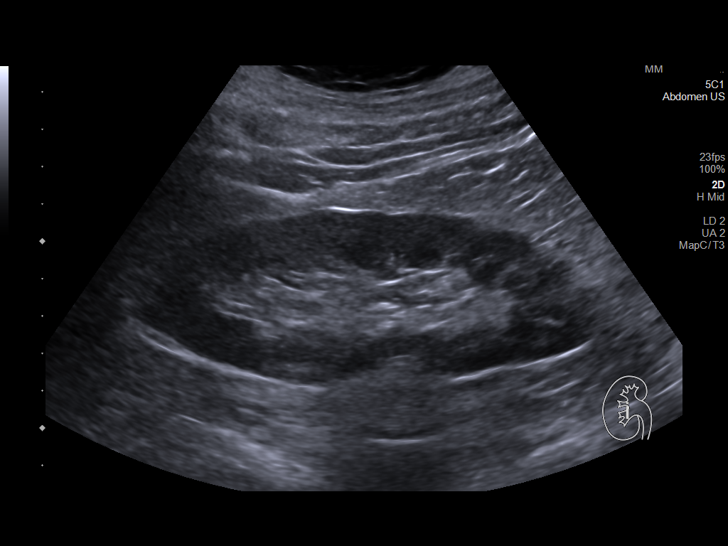
[im 8/48]
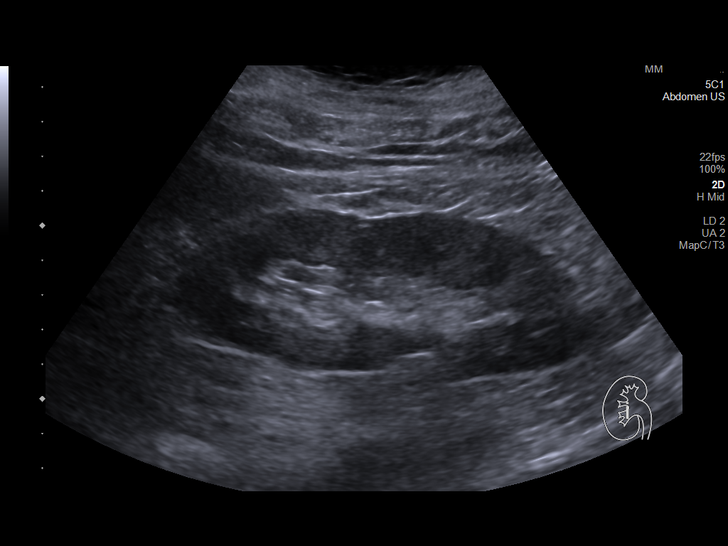
[im 12/48]
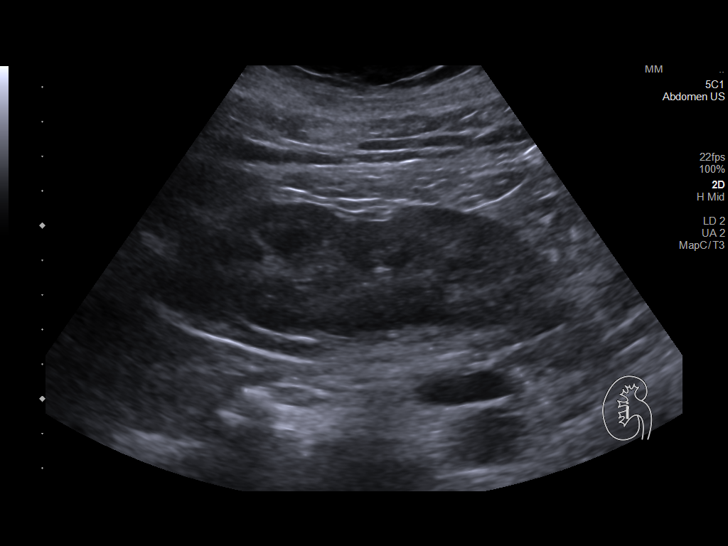
[im 16/48]
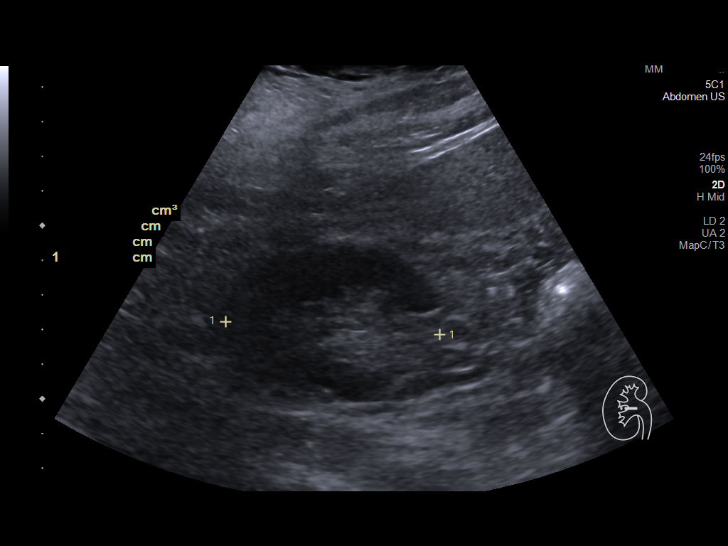
[im 20/48]
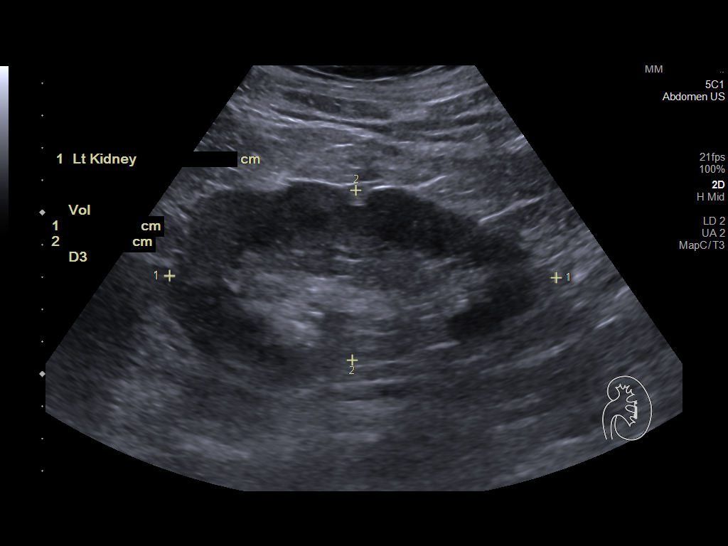
[im 24/48]
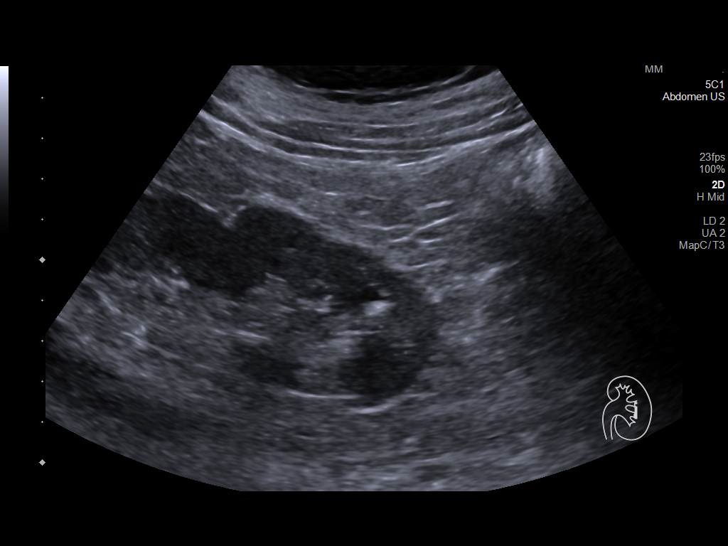
[im 28/48]
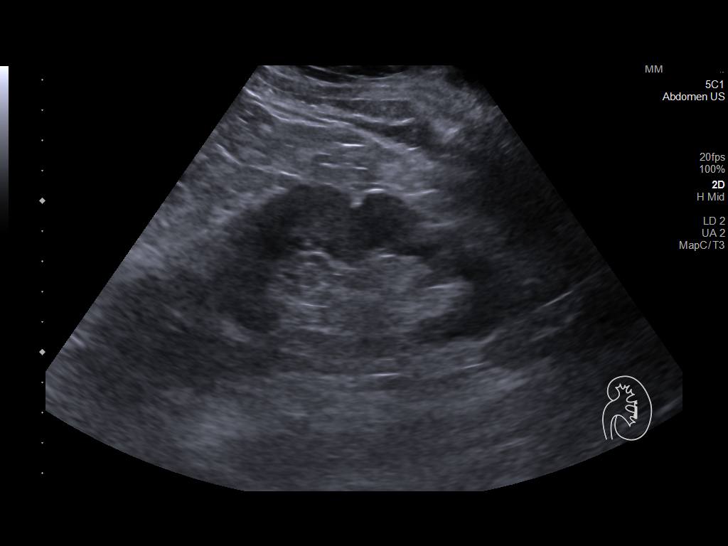
[im 32/48]
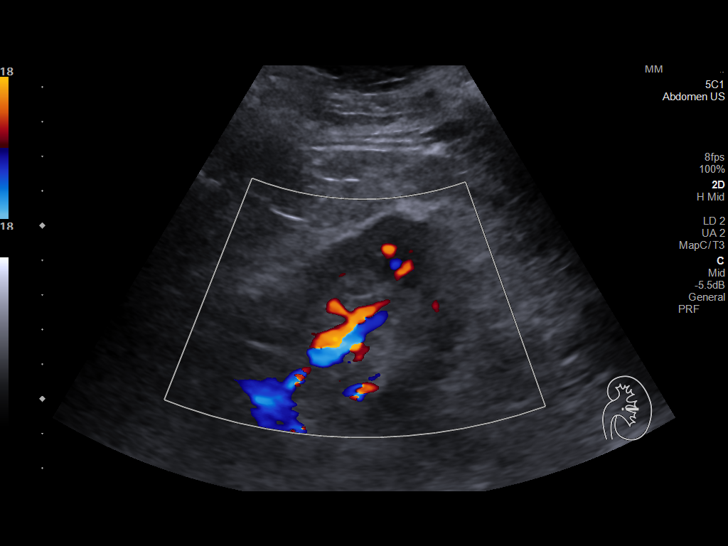
[im 36/48]
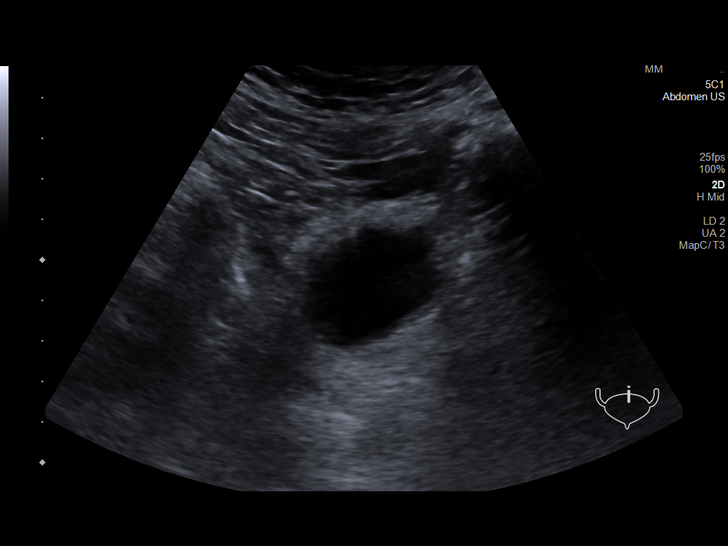
[im 40/48]
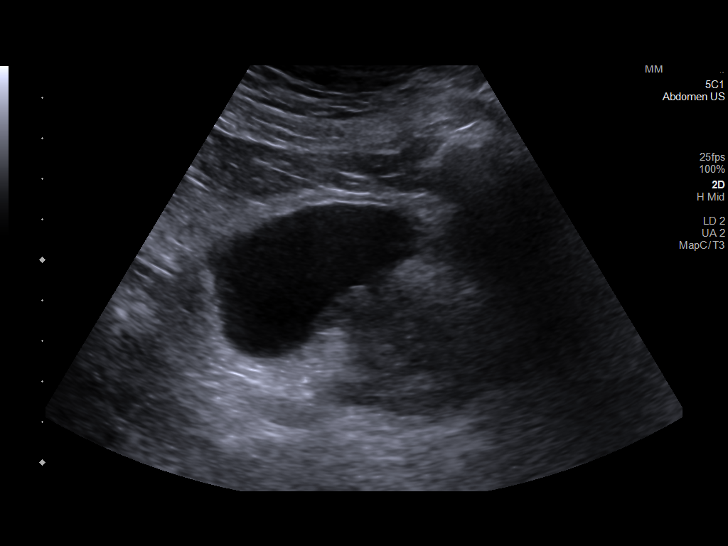
[im 44/48]
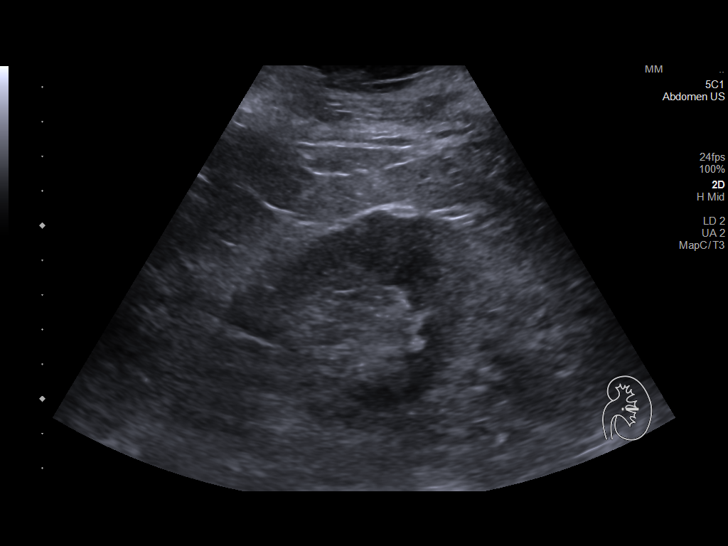
[im 48/48]
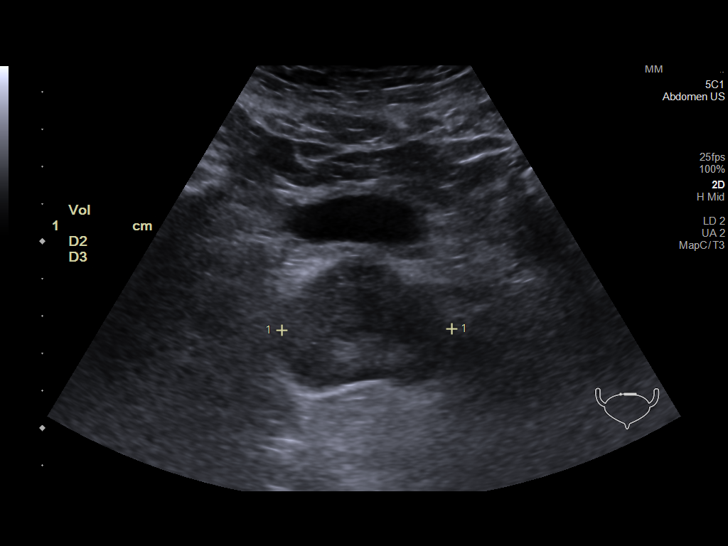

[13 of 25 positions shown; findings below may reference images not displayed]

FINDINGS: Right Kidney:

Renal measurements: 12.6 by 4.6 by 6.2 cm = volume: 187 mL.
Echogenicity within normal limits. No mass or hydronephrosis
visualized. The punctate stone seen in the right kidney upper pole
on the [DATE] CT examination was not readily seen
sonographically.

Left Kidney:

Renal measurements: 12.0 by 5.3 by 6.0 cm = volume: 200 mL.
Echogenicity within normal limits. No mass or hydronephrosis
visualized. Questionable hyperdensity along the left kidney lower
pole measuring about 0.5 cm in diameter, although no stone was seen
in this vicinity on the recent CT of [DATE]. Questionable
shadowing associated with this finding.

Bladder:

Appears normal for degree of bladder distention.

Other:

Prostate is incidentally noted to be 35 cc in volume, which is
normal.
IMPRESSION: 1. Previous left hydronephrosis has resolved.
2. The 2 mm right kidney upper pole calculus seen on prior CT was
not well seen today. There is a questionable 5 mm hyperdensity in
the left kidney lower pole without definite shadowing, probably
incidental and less likely to be a renal calculus given that no
stone was seen in this vicinity on [DATE].

## 2021-08-17 IMAGING — DX DG ABDOMEN 1V
2 series · 2 of 2 positions shown · non-contrast
Comparison: Radiographs of [DATE]

CLINICAL DATA: Nephrolithiasis. Left ureteral stone on the CT from
[DATE]

EXAM:
ABDOMEN - 1 VIEW

[abdomen kub (1 of 2)]
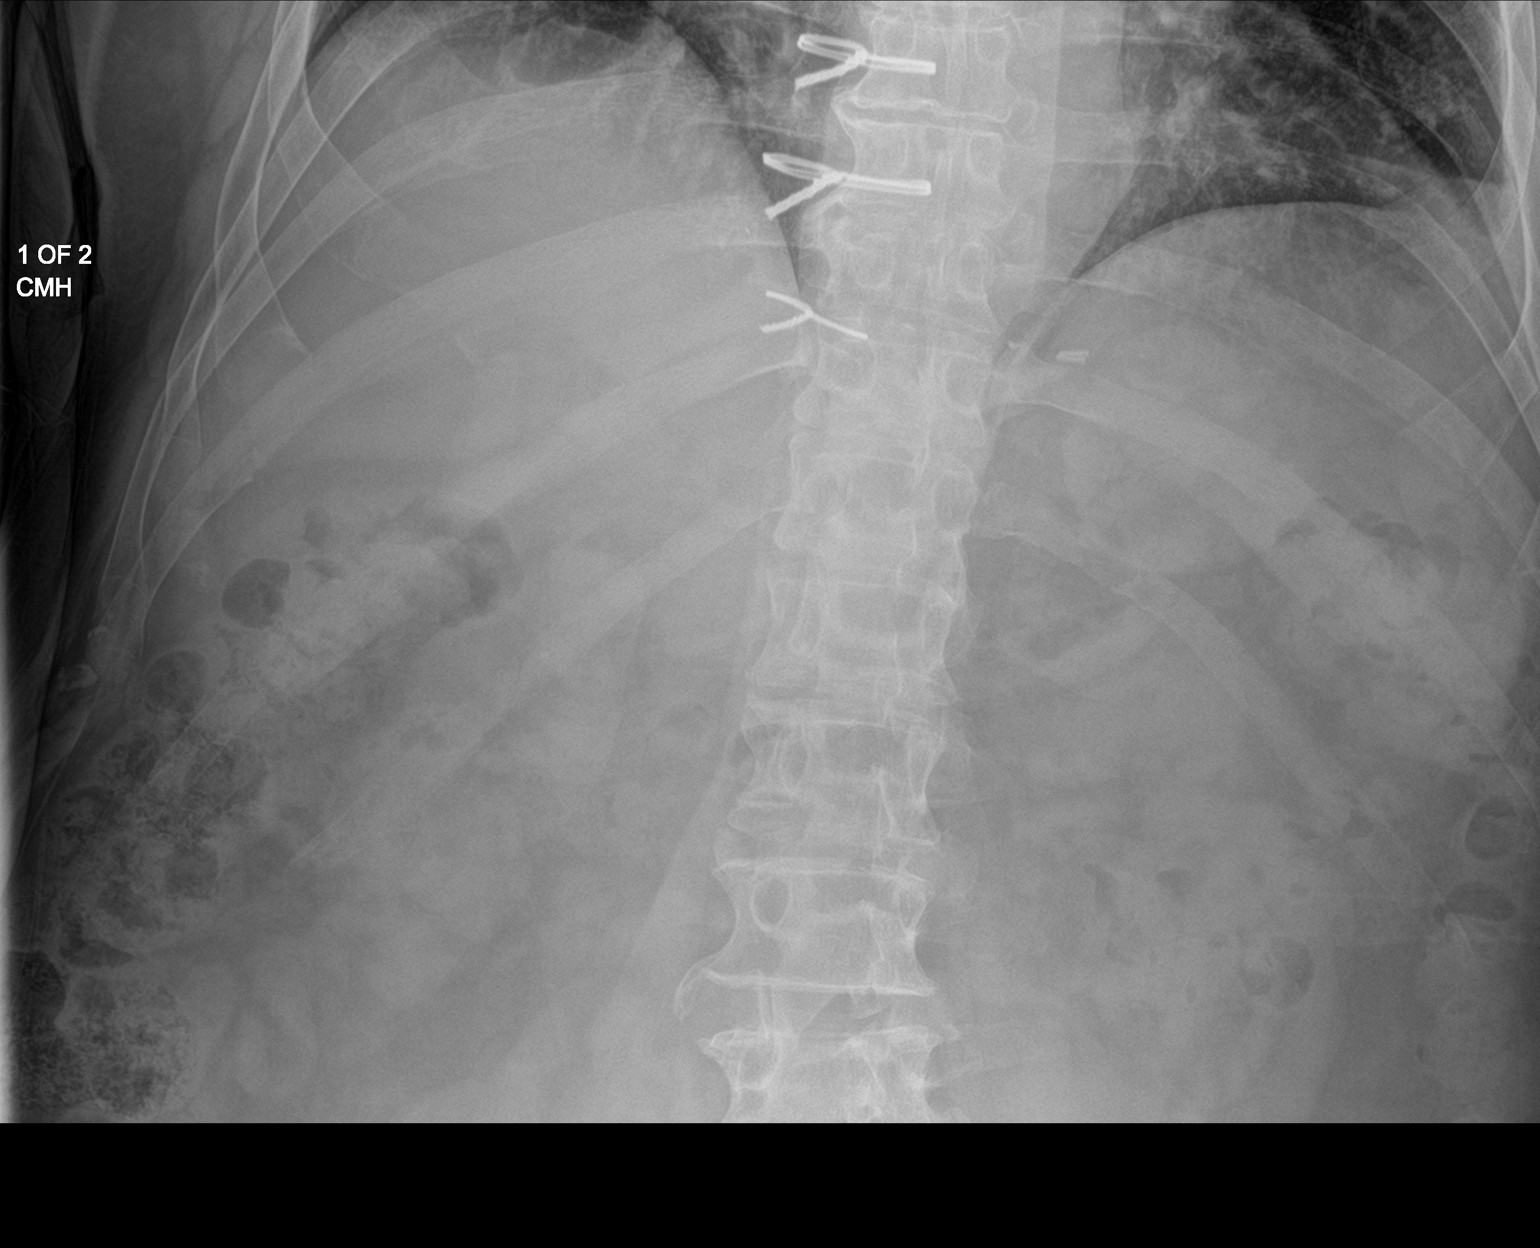

[abdomen kub (2 of 2)]
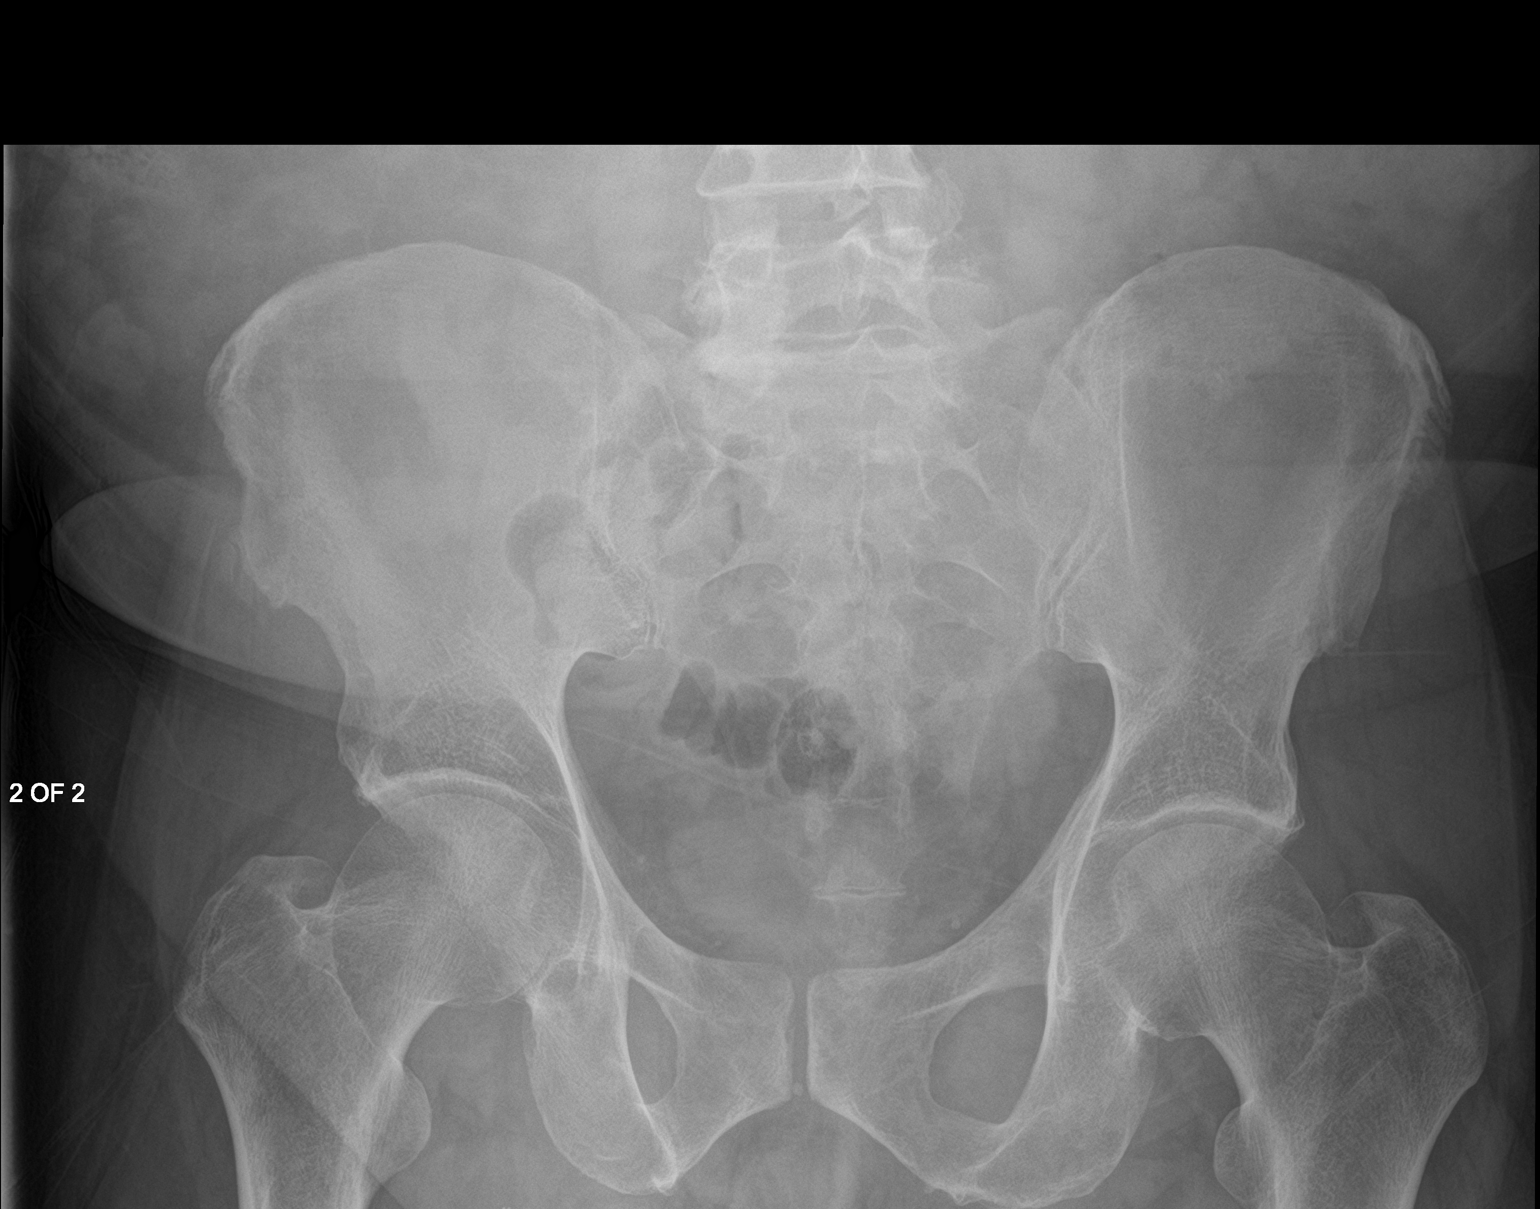

[2 of 2 positions shown; findings below may reference images not displayed]

FINDINGS: Sternotomy wires noted. Mild dextroconvex lumbar scoliosis with
rotary component.

Questionable 2 mm radiodensity projecting over the right kidney
upper pole and over the right twelfth rib could represent a small
nonobstructive right kidney upper pole calculus. No other calculi
project over the expected course of the urinary tract.

Small vascular calcifications in the lower anatomic pelvis. Dilated
bowel.
IMPRESSION: 1. 2 mm calcification projecting over the right kidney upper pole,
possibly a small nonobstructive renal calculus. No other calculi
identified.

## 2021-08-17 MED ORDER — CLOTRIMAZOLE-BETAMETHASONE 1-0.05 % EX CREA: 1.0000 "application " | TOPICAL_CREAM | Freq: Two times a day (BID) | CUTANEOUS | 0 refills | Status: DC

## 2021-08-17 NOTE — Progress Notes (Signed)
Urological Symptom Review  Patient is experiencing the following symptoms: Frequent urination Get up at night to urinate Weak stream   Review of Systems  Gastrointestinal (upper)  : Negative for upper GI symptoms  Gastrointestinal (lower) : Constipation  Constitutional : Negative for symptoms  Skin: Negative for skin symptoms  Eyes: Negative for eye symptoms  Ear/Nose/Throat : Negative for Ear/Nose/Throat symptoms  Hematologic/Lymphatic: Negative for Hematologic/Lymphatic symptoms  Cardiovascular : Negative for cardiovascular symptoms  Respiratory : Negative for respiratory symptoms  Endocrine: Negative for endocrine symptoms  Musculoskeletal: Negative for musculoskeletal symptoms  Neurological: Negative for neurological symptoms  Psychologic: Anxiety

## 2021-08-17 NOTE — Progress Notes (Signed)
08/17/2021 10:49 AM   [name] Jan 23, 1966 [id number]  Referring provider: Soyla Dryer, PA-C [place],   [id number]  Followup nephrolithiasis   HPI: Mr Southgate is a 55yo here for followup for nephrolithiais. He denies any flank pain. He has not passed any stones since last visit. He did not get his renal US. No worsening LUTS. He has complaints today of peniele irritation and swelling of his foreskin. It has been present for several months. No prior cream therapy.    PMH: Past Medical History:  Diagnosis Date   Arteriosclerotic cardiovascular disease (ASCVD)    Onset in 04/2012; CABG in 06/2012 w/ LIMA-LAD, L Rad-CFX, SVG-PL-PDA   Hyperlipidemia    goal LDL < 70   Insomnia    Nephrolithiasis     Surgical History: Past Surgical History:  Procedure Laterality Date   CORONARY ARTERY BYPASS GRAFT  06/24/2012   Procedure: CORONARY ARTERY BYPASS GRAFTING (CABG);  Surgeon: [name], MD;  Location: [facility];  Service: Open Heart Surgery;  Laterality: N/A;  Coronary Artery  Bypass X 4 utilizing endoscopic saphenous vein graft and  Left radial Artery Harvest    CYSTOSCOPY W/ RETROGRADES Left 08/23/2015   Procedure: CYSTOSCOPY WITH RETROGRADE PYELOGRAM;  Surgeon: [name], MD;  Location: [facility];  Service: Urology;  Laterality: Left;   CYSTOSCOPY WITH URETEROSCOPY, STONE BASKETRY AND STENT PLACEMENT Left 08/23/2015   Procedure: CYSTOSCOPY WITH URETEROSCOPY, STONE BASKET EXTRACTION;  Surgeon: [name], MD;  Location: [facility];  Service: Urology;  Laterality: Left;   HOLMIUM LASER APPLICATION Left 00/93/8182   Procedure: HOLMIUM LASER APPLICATION;  Surgeon: [name], MD;  Location: [facility];  Service: Urology;  Laterality: Left;   LEFT HEART CATHETERIZATION WITH CORONARY ANGIOGRAM N/A 06/23/2012   Procedure: LEFT HEART CATHETERIZATION WITH CORONARY ANGIOGRAM;  Surgeon: [name], MD;  Location: [facility] C[facility];  Service:  Cardiovascular;  Laterality: N/A;   RHINOPLASTY     TONSILLECTOMY  ~1971    Home Medications:  Allergies as of 08/17/2021       Reactions   Prozac [fluoxetine] Other (See Comments)   "made me shake" intolerance.    Pollen Extract Cough        Medication List        Accurate as of August 17, 2021 10:49 AM. If you have any questions, ask your nurse or doctor.          aspirin 81 MG chewable tablet Chew 81 mg by mouth every evening. TO START DOSAGE IN 6 MONTHS APPROXIMATELY July 2014   atorvastatin 40 MG tablet Commonly known as: LIPITOR Take 1 tablet (40 mg total) by mouth daily.   metoprolol succinate 25 MG 24 hr tablet Commonly known as: TOPROL-XL Take 1 tablet (25 mg total) by mouth daily.   nitroGLYCERIN 0.4 MG SL tablet Commonly known as: NITROSTAT Place 1 tablet (0.4 mg total) under the tongue every 5 (five) minutes as needed for chest pain.   omega-3 acid ethyl esters 1 g capsule Commonly known as: LOVAZA Take 1 g by mouth every evening.   ondansetron 4 MG tablet Commonly known as: ZOFRAN Take 1 tablet (4 mg total) by mouth every 8 (eight) hours as needed for nausea or vomiting.   oxyCODONE-acetaminophen 5-325 MG tablet Commonly known as: Percocet Take 1 tablet by mouth every 4 (four) hours as needed.   tamsulosin 0.4 MG Caps capsule Commonly known as: FLOMAX Take 1 capsule (0.4 mg total) by  mouth daily.        Allergies:  Allergies  Allergen Reactions   Prozac [Fluoxetine] Other (See Comments)    "made me shake" intolerance.    Pollen Extract Cough    Family History: Family History  Problem Relation Age of Onset   Hypertension Mother    Ulcers Father        Peptic ulcer disease   Coronary artery disease Father        Stents   Coronary artery disease Brother        CABG    Social History:  reports that he has never smoked. He quit smokeless tobacco use about 25 years ago.  His smokeless tobacco use included chew. He reports that  he does not drink alcohol and does not use drugs.  ROS: All other review of systems were reviewed and are negative except what is noted above in HPI  Physical Exam: BP 138/79   Pulse 61   Temp 98.5 F (36.9 C)   Wt 202 lb (91.6 kg)   BMI 28.98 kg/m   Constitutional:  Alert and oriented, No acute distress. HEENT: [place] AT, moist mucus membranes.  Trachea midline, no masses. Cardiovascular: No clubbing, cyanosis, or edema. Respiratory: Normal respiratory effort, no increased work of breathing. GI: Abdomen is soft, nontender, nondistended, no abdominal masses GU: No CVA tenderness.  Lymph: No cervical or inguinal lymphadenopathy. Skin: No rashes, bruises or suspicious lesions. Neurologic: Grossly intact, no focal deficits, moving all 4 extremities. Psychiatric: Normal mood and affect.  Laboratory Data: Lab Results  Component Value Date   WBC 11.1 (H) 06/24/2021   HGB 15.5 06/24/2021   HCT 46.6 06/24/2021   MCV 91.0 06/24/2021   PLT 161 06/24/2021    Lab Results  Component Value Date   CREATININE 1.15 06/24/2021    Lab Results  Component Value Date   PSA 1.54 05/27/2016    No results found for: TESTOSTERONE  Lab Results  Component Value Date   HGBA1C 6.1 (H) 11/28/2020    Urinalysis    Component Value Date/Time   COLORURINE YELLOW 06/24/2021 2000   APPEARANCEUR Clear 07/20/2021 1104   LABSPEC 1.016 06/24/2021 2000   PHURINE 5.0 06/24/2021 2000   GLUCOSEU Negative 07/20/2021 1104   HGBUR LARGE (A) 06/24/2021 2000   BILIRUBINUR Negative 07/20/2021 1104   KETONESUR NEGATIVE 06/24/2021 2000   PROTEINUR Negative 07/20/2021 1104   PROTEINUR NEGATIVE 06/24/2021 2000   UROBILINOGEN 0.2 08/15/2015 1441   UROBILINOGEN 0.2 07/20/2015 0857   NITRITE Negative 07/20/2021 1104   NITRITE NEGATIVE 06/24/2021 2000   LEUKOCYTESUR Negative 07/20/2021 1104   LEUKOCYTESUR NEGATIVE 06/24/2021 2000    Lab Results  Component Value Date   LABMICR Comment 07/20/2021    BACTERIA NONE SEEN 06/24/2021    Pertinent Imaging:  Results for orders placed during the hospital encounter of 07/20/21  Abdomen 1 view (KUB)  Narrative CLINICAL DATA:  Nephrolithiasis.  EXAM: ABDOMEN - 1 VIEW  COMPARISON:  None.  FINDINGS: Limited evaluation due to overlapping osseous structures and overlying soft tissues. The bowel gas pattern is normal. No radio-opaque calculi or other significant radiographic abnormality are seen. Degenerative changes of the lumbar spine.  IMPRESSION: Negative.   Electronically Signed By: Iven Finn M.D. On: 07/20/2021 23:56  No results found for this or any previous visit.  No results found for this or any previous visit.  No results found for this or any previous visit.  Results for orders placed during the hospital encounter  of 03/06/16  US Renal  Narrative CLINICAL DATA:  Ureterolithiasis.  Followup cystoscopy.  EXAM: RENAL / URINARY TRACT ULTRASOUND COMPLETE  COMPARISON:  CT scan 07/20/2015  FINDINGS: Right Kidney:  Length: 12.4 cm. Normal renal cortical thickness. Slight increased echogenicity. No renal lesions, renal calculi or hydronephrosis.  Left Kidney:  Length: 13.0 cm. Normal renal cortical thickness. Slight increased echogenicity. No hydronephrosis. Question renal calculus.  Bladder:  Normal  IMPRESSION: Normal renal cortical thickness bilaterally with slight increased echogenicity. No hydronephrosis.  Possible right midpole renal calculus.  Normal bladder.   Electronically Signed By: Marijo Sanes M.D. On: 03/06/2016 16:54  No results found for this or any previous visit.  No results found for this or any previous visit.  Results for orders placed during the hospital encounter of 06/24/21  CT Renal Stone Study  Narrative CLINICAL DATA:  Left flank pain, microscopic hematuria, history of nephrolithiasis  EXAM: CT ABDOMEN AND PELVIS WITHOUT  CONTRAST  TECHNIQUE: Multidetector CT imaging of the abdomen and pelvis was performed following the standard protocol without IV contrast.  COMPARISON:  07/20/2015  FINDINGS: Lower chest: 5 mm right middle lobe pulmonary nodule within the visualized right lung base is stable since prior examination and safely considered benign. The visualized lung bases are otherwise clear. Extensive right coronary artery calcification. Global cardiac size within normal limits. No pericardial effusion.  Hepatobiliary: No focal liver abnormality is seen. No gallstones, gallbladder wall thickening, or biliary dilatation.  Pancreas: Unremarkable  Spleen: Unremarkable  Adrenals/Urinary Tract: The adrenal glands are unremarkable. The kidneys are normal in size and position. An obstructing 2 mm x 3 mm x 5 mm calculus is seen within the proximal left ureter at the level of the transverse process of L4 resulting in mild left hydronephrosis. No additional nephro or urolithiasis noted on the left. Two punctate 1-2 mm nonobstructing calculi are noted within the upper pole the right kidney. No hydronephrosis on the right. The bladder is unremarkable.  Stomach/Bowel: Moderate transverse, descending, and sigmoid colonic diverticulosis. The stomach, small bowel, and large bowel are otherwise unremarkable. Appendix normal. No evidence of obstruction or focal inflammation. No free intraperitoneal gas or fluid.  Vascular/Lymphatic: Mild aortoiliac atherosclerotic calcification. No aortic aneurysm. No pathologic adenopathy within the abdomen and pelvis.  Reproductive: Moderate prostatic enlargement. Seminal vesicles are unremarkable.  Other: Tiny fat containing umbilical hernia.  Rectum unremarkable.  Musculoskeletal: No acute bone abnormality. No lytic or blastic bone lesions are identified.  IMPRESSION: Obstructing 2 x 3 x 5 mm calculus within the proximal left ureter resulting in mild left  hydronephrosis.  Minimal nonobstructing right nephrolithiasis.  Extensive right coronary artery calcification.  Moderate colonic diverticulosis.  Moderate prostatic enlargement.  Aortic Atherosclerosis (ICD10-I70.0).   Electronically Signed By: Fidela Salisbury M.D. On: 06/24/2021 21:45   Assessment & Plan:    1. Kidney stones -renal US today, will call with results. If his [place] in normal I will see him back in 6 months with renal US and KUB - Urinalysis, Routine w reflex microscopic   No follow-ups on file.  Nicolette Bang, MD  [facility] [place]

## 2021-08-17 NOTE — Patient Instructions (Signed)
Dietary Guidelines to Help Prevent Kidney Stones Kidney stones are deposits of minerals and salts that form inside your kidneys. Your risk of developing kidney stones may be greater depending on your diet, your lifestyle, the medicines you take, and whether you have certain medical conditions. Most people can lower their chances of developing kidney stones by following the instructions below. Your dietitian may give you more specific instructions depending on your overall health and the type of kidney stones you tend to develop. What are tips for following this plan? Reading food labels  Choose foods with "no salt added" or "low-salt" labels. Limit your salt (sodium) intake to less than 1,500 mg a day. Choose foods with calcium for each meal and snack. Try to eat about 300 mg of calcium at each meal. Foods that contain 200-500 mg of calcium a serving include: 8 oz (237 mL) of milk, calcium-fortifiednon-dairy milk, and calcium-fortifiedfruit juice. Calcium-fortified means that calcium has been added to these drinks. 8 oz (237 mL) of kefir, yogurt, and soy yogurt. 4 oz (114 g) of tofu. 1 oz (28 g) of cheese. 1 cup (150 g) of dried figs. 1 cup (91 g) of cooked broccoli. One 3 oz (85 g) can of sardines or mackerel. Most people need 1,000-1,500 mg of calcium a day. Talk to your dietitian about how much calcium is recommended for you. Shopping Buy plenty of fresh fruits and vegetables. Most people do not need to avoid fruits and vegetables, even if these foods contain nutrients that may contribute to kidney stones. When shopping for convenience foods, choose: Whole pieces of fruit. Pre-made salads with dressing on the side. Low-fat fruit and yogurt smoothies. Avoid buying frozen meals or prepared deli foods. These can be high in sodium. Look for foods with live cultures, such as yogurt and kefir. Choose high-fiber grains, such as whole-wheat breads, oat bran, and wheat cereals. Cooking Do not add  salt to food when cooking. Place a salt shaker on the table and allow each person to add his or her own salt to taste. Use vegetable protein, such as beans, textured vegetable protein (TVP), or tofu, instead of meat in pasta, casseroles, and soups. Meal planning Eat less salt, if told by your dietitian. To do this: Avoid eating processed or pre-made food. Avoid eating fast food. Eat less animal protein, including cheese, meat, poultry, or fish, if told by your dietitian. To do this: Limit the number of times you have meat, poultry, fish, or cheese each week. Eat a diet free of meat at least 2 days a week. Eat only one serving each day of meat, poultry, fish, or seafood. When you prepare animal protein, cut pieces into small portion sizes. For most meat and fish, one serving is about the size of the palm of your hand. Eat at least five servings of fresh fruits and vegetables each day. To do this: Keep fruits and vegetables on hand for snacks. Eat one piece of fruit or a handful of berries with breakfast. Have a salad and fruit at lunch. Have two kinds of vegetables at dinner. Limit foods that are high in a substance called oxalate. These include: Spinach (cooked), rhubarb, beets, sweet potatoes, and Swiss chard. Peanuts. Potato chips, french fries, and baked potatoes with skin on. Nuts and nut products. Chocolate. If you regularly take a diuretic medicine, make sure to eat at least 1 or 2 servings of fruits or vegetables that are high in potassium each day. These include: Avocado. Banana. Orange, prune,   carrot, or tomato juice. Baked potato. Cabbage. Beans and split peas. Lifestyle  Drink enough fluid to keep your urine pale yellow. This is the most important thing you can do. Spread your fluid intake throughout the day. If you drink alcohol: Limit how much you use to: 0-1 drink a day for women who are not pregnant. 0-2 drinks a day for men. Be aware of how much alcohol is in your  drink. In the [place] one drink equals one 12 oz bottle of beer (355 mL), one 5 oz glass of wine (148 mL), or one 1 oz glass of hard liquor (44 mL). Lose weight if told by your health care provider. Work with your dietitian to find an eating plan and weight loss strategies that work best for you. General information Talk to your health care provider and dietitian about taking daily supplements. You may be told the following depending on your health and the cause of your kidney stones: Not to take supplements with vitamin C. To take a calcium supplement. To take a daily probiotic supplement. To take other supplements such as magnesium, fish oil, or vitamin B6. Take over-the-counter and prescription medicines only as told by your health care provider. These include supplements. What foods should I limit? Limit your intake of the following foods, or eat them as told by your dietitian. Vegetables Spinach. Rhubarb. Beets. Canned vegetables. Pickles. Olives. Baked potatoes with skin. Grains Wheat bran. Baked goods. Salted crackers. Cereals high in sugar. Meats and other proteins Nuts. Nut butters. Large portions of meat, poultry, or fish. Salted, precooked, or cured meats, such as sausages, meat loaves, and hot dogs. Dairy Cheese. Beverages Regular soft drinks. Regular vegetable juice. Seasonings and condiments Seasoning blends with salt. Salad dressings. Soy sauce. Ketchup. Barbecue sauce. Other foods Canned soups. Canned pasta sauce. Casseroles. Pizza. Lasagna. Frozen meals. Potato chips. French fries. The items listed above may not be a complete list of foods and beverages you should limit. Contact a dietitian for more information. What foods should I avoid? Talk to your dietitian about specific foods you should avoid based on the type of kidney stones you have and your overall health. Fruits Grapefruit. The item listed above may not be a complete list of foods and beverages you should  avoid. Contact a dietitian for more information. Summary Kidney stones are deposits of minerals and salts that form inside your kidneys. You can lower your risk of kidney stones by making changes to your diet. The most important thing you can do is drink enough fluid. Drink enough fluid to keep your urine pale yellow. Talk to your dietitian about how much calcium you should have each day, and eat less salt and animal protein as told by your dietitian. This information is not intended to replace advice given to you by your health care provider. Make sure you discuss any questions you have with your health care provider. Document Revised: 10/14/2019 Document Reviewed: 10/14/2019 Elsevier Patient Education  2022 [employer]  

## 2021-08-21 ENCOUNTER — Encounter: Payer: Self-pay | Admitting: Urology

## 2021-08-21 NOTE — Progress Notes (Signed)
Sent via mychart and mail. 

## 2021-08-28 ENCOUNTER — Ambulatory Visit: Payer: Self-pay | Admitting: Physician Assistant

## 2021-08-28 DIAGNOSIS — I2510 Atherosclerotic heart disease of native coronary artery without angina pectoris: Secondary | ICD-10-CM

## 2021-08-28 NOTE — Progress Notes (Signed)
There were no vitals taken for this visit.   Subjective:    Patient ID: Edward Fisher, male    DOB: Jan 25, 1966, 55 y.o.   MRN: [id number]  HPI: Edward Fisher is a 55 y.o. male presenting on 08/28/2021 for No chief complaint on file.   HPI   This is a telemedicine appointment through [employer]  I connected with  [name] on 08/28/21 by a video enabled telemedicine application and verified that I am speaking with the correct person using two identifiers.   I discussed the limitations of evaluation and management by telemedicine. The patient expressed understanding and agreed to proceed.  Pt is at work.  Provider is at office.   Pt is 48yoM with CAD s/p CABG in 2013.  He follows annually with cardiology.  He says he is not having any CP or SOB.    He has been seeing urolgist for kidney stones recently.  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  He has seen    This office referred him to neurology in May 2021 and he was seen at [facility].   He had some testing done.   He was seen by orthopedist in [place] for his shoulder and PT was ordered.  At some point, pt went to see orthopedist in [place] (whose notes are not available) and this orthopedist in [place] referred pt to a different neurologist who works in [place] and is part of [facility] [facility].  This Neurologist referred him to pain mgt.  Pt says he doesn't have financial assistance with [facility] and it's expensive.   Discussed with pt previously how to apply for [employer] assistance but he didn't.  Discussed with pt that if he isn't going to apply for assistance, he will be getting bills.    He has not gotten covid vaccination  He says he misses his wife who died of covid and was also unvaccinated.  He says he is doing okay with it but sometimes it is difficult.      Relevant past medical, surgical, family and social history reviewed and updated as indicated. Interim medical history since our  last visit reviewed. Allergies and medications reviewed and updated.    Current Outpatient Medications:    aspirin 81 MG chewable tablet, Chew 81 mg by mouth every evening. TO START DOSAGE IN 6 MONTHS APPROXIMATELY July 2014, Disp: , Rfl:    atorvastatin (LIPITOR) 40 MG tablet, Take 1 tablet (40 mg total) by mouth daily., Disp: 30 tablet, Rfl: 4   metoprolol succinate (TOPROL-XL) 25 MG 24 hr tablet, Take 1 tablet (25 mg total) by mouth daily., Disp: 30 tablet, Rfl: 6   omega-3 acid ethyl esters (LOVAZA) 1 G capsule, Take 1 g by mouth every evening., Disp: , Rfl:    tamsulosin (FLOMAX) 0.4 MG CAPS capsule, Take 1 capsule (0.4 mg total) by mouth daily., Disp: 30 capsule, Rfl: 4   clotrimazole-betamethasone (LOTRISONE) cream, Apply 1 application topically 2 (two) times daily., Disp: 30 g, Rfl: 0   nitroGLYCERIN (NITROSTAT) 0.4 MG SL tablet, Place 1 tablet (0.4 mg total) under the tongue every 5 (five) minutes as needed for chest pain. (Patient not taking: Reported on 08/28/2021), Disp: 25 tablet, Rfl: PRN   ondansetron (ZOFRAN) 4 MG tablet, Take 1 tablet (4 mg total) by mouth every 8 (eight) hours as needed for nausea or vomiting. (Patient not taking: Reported on 08/28/2021), Disp: 4 tablet, Rfl: 0   oxyCODONE-acetaminophen (PERCOCET) 5-325 MG  tablet, Take 1 tablet by mouth every 4 (four) hours as needed. (Patient not taking: Reported on 08/28/2021), Disp: 30 tablet, Rfl: 0   Review of Systems  Per HPI unless specifically indicated above     Objective:    There were no vitals taken for this visit.  Wt Readings from Last 3 Encounters:  08/17/21 202 lb (91.6 kg)  06/24/21 200 lb (90.7 kg)  05/28/21 206 lb (93.4 kg)    Physical Exam Constitutional:      General: He is not in acute distress.    Appearance: He is not toxic-appearing.  HENT:     Head: Normocephalic and atraumatic.  Pulmonary:     Effort: No respiratory distress.     Comments: Pt is talking in complete sentences without  dyspnea Neurological:     Mental Status: He is alert and oriented to person, place, and time.  Psychiatric:        Attention and Perception: Attention normal.        Speech: Speech normal.         Assessment & Plan:   Encounter Diagnosis  Name Primary?   Arteriosclerotic cardiovascular disease (ASCVD) Yes     -no changes to medication today -pt to continue with his specialists per their recommendation -reminded pt that completing financial assistance applications could help reduce his bills but he can't get approved if he doesn't apply.  Reminded pt that the [employer] application for financial assistance can be found at the [employer] website -counseled pt about the benefits of covid vaccination but he isn't interested -pt to follow up 3 months.  He will need to update labs at that time.  He is to contact office sooner prn

## 2021-08-29 ENCOUNTER — Encounter: Payer: Self-pay | Admitting: Physician Assistant

## 2021-09-11 ENCOUNTER — Telehealth: Payer: Self-pay | Admitting: Licensed Clinical Social Worker

## 2021-09-11 NOTE — Telephone Encounter (Signed)
[facility] reached patient via phone call regarding scheduling for counseling services, first appointment was scheduled for 11/22 at 3 pm.

## 2021-09-25 ENCOUNTER — Ambulatory Visit: Payer: Self-pay | Admitting: Licensed Clinical Social Worker

## 2021-09-25 DIAGNOSIS — F4321 Adjustment disorder with depressed mood: Secondary | ICD-10-CM

## 2021-09-25 DIAGNOSIS — F419 Anxiety disorder, unspecified: Secondary | ICD-10-CM

## 2021-09-25 NOTE — Progress Notes (Signed)
[facility] engaged patient in initial session. Patient presented with anxiety related to work and depression symptoms and grief related to loss of his wife 1.5 years prior. Patient also struggles with some health  and sleep issues. Patient's symptoms will be addressed through ongoing biweekly counseling.

## 2021-10-09 ENCOUNTER — Ambulatory Visit: Payer: Self-pay | Admitting: Licensed Clinical Social Worker

## 2021-10-09 DIAGNOSIS — F419 Anxiety disorder, unspecified: Secondary | ICD-10-CM

## 2021-10-09 DIAGNOSIS — F4321 Adjustment disorder with depressed mood: Secondary | ICD-10-CM

## 2021-10-10 NOTE — Progress Notes (Signed)
[facility] engaged patient in follow-up [facility] session. [facility] provided reflective listening and empathetic presence as patient shared about grief related to wife's death. [facility] provided psycho-education related to grief process. Patient shared about anxiety related to work, therapist used CBT to help patient identify and challenge some of the related thoughts.

## 2021-11-13 ENCOUNTER — Ambulatory Visit: Payer: Self-pay | Admitting: Licensed Clinical Social Worker

## 2021-11-19 ENCOUNTER — Ambulatory Visit: Payer: No Typology Code available for payment source | Admitting: Urology

## 2021-11-21 ENCOUNTER — Ambulatory Visit: Payer: Self-pay | Admitting: Licensed Clinical Social Worker

## 2021-11-29 ENCOUNTER — Other Ambulatory Visit: Payer: Self-pay | Admitting: Physician Assistant

## 2021-11-29 DIAGNOSIS — E785 Hyperlipidemia, unspecified: Secondary | ICD-10-CM

## 2021-11-29 DIAGNOSIS — I2510 Atherosclerotic heart disease of native coronary artery without angina pectoris: Secondary | ICD-10-CM

## 2021-11-29 DIAGNOSIS — I10 Essential (primary) hypertension: Secondary | ICD-10-CM

## 2021-11-29 DIAGNOSIS — R7303 Prediabetes: Secondary | ICD-10-CM

## 2021-11-29 DIAGNOSIS — Z125 Encounter for screening for malignant neoplasm of prostate: Secondary | ICD-10-CM

## 2021-12-06 ENCOUNTER — Other Ambulatory Visit (HOSPITAL_COMMUNITY)
Admission: RE | Admit: 2021-12-06 | Discharge: 2021-12-06 | Disposition: A | Discharge status: Home / Self Care | Payer: No Typology Code available for payment source | Source: Ambulatory Visit | Attending: Physician Assistant | Admitting: Physician Assistant

## 2021-12-06 ENCOUNTER — Other Ambulatory Visit: Payer: Self-pay | Admitting: Physician Assistant

## 2021-12-06 DIAGNOSIS — R7303 Prediabetes: Secondary | ICD-10-CM | POA: Insufficient documentation

## 2021-12-06 DIAGNOSIS — I2510 Atherosclerotic heart disease of native coronary artery without angina pectoris: Secondary | ICD-10-CM | POA: Insufficient documentation

## 2021-12-06 DIAGNOSIS — Z125 Encounter for screening for malignant neoplasm of prostate: Secondary | ICD-10-CM | POA: Insufficient documentation

## 2021-12-06 DIAGNOSIS — E785 Hyperlipidemia, unspecified: Secondary | ICD-10-CM | POA: Insufficient documentation

## 2021-12-06 DIAGNOSIS — I10 Essential (primary) hypertension: Secondary | ICD-10-CM | POA: Insufficient documentation

## 2021-12-06 LAB — COMPREHENSIVE METABOLIC PANEL
ALT: 24 U/L (ref 0–44)
AST: 19 U/L (ref 15–41)
Albumin: 4.4 g/dL (ref 3.5–5.0)
Alkaline Phosphatase: 63 U/L (ref 38–126)
Anion gap: 8 (ref 5–15)
BUN: 17 mg/dL (ref 6–20)
CO2: 25 mmol/L (ref 22–32)
Calcium: 9.4 mg/dL (ref 8.9–10.3)
Chloride: 106 mmol/L (ref 98–111)
Creatinine, Ser: 0.92 mg/dL (ref 0.61–1.24)
GFR, Estimated: >60 mL/min (ref >60)
Glucose, Bld: 115 mg/dL — ABNORMAL HIGH (ref 70–99)
Potassium: 4.2 mmol/L (ref 3.5–5.1)
Sodium: 139 mmol/L (ref 135–145)
Total Bilirubin: 0.6 mg/dL (ref 0.3–1.2)
Total Protein: 7.3 g/dL (ref 6.5–8.1)

## 2021-12-06 LAB — LIPID PANEL
Cholesterol: 114 mg/dL (ref 0–200)
HDL: 33 mg/dL — ABNORMAL LOW (ref >40)
LDL Cholesterol: 67 mg/dL (ref 0–99)
Total CHOL/HDL Ratio: 3.5 RATIO
Triglycerides: 72 mg/dL (ref <150)
VLDL: 14 mg/dL (ref 0–40)

## 2021-12-06 LAB — PSA: Prostatic Specific Antigen: 1.12 ng/mL (ref 0.00–4.00)

## 2021-12-06 LAB — HEMOGLOBIN A1C
Hgb A1c MFr Bld: 5.8 % — ABNORMAL HIGH (ref 4.8–5.6)
Mean Plasma Glucose: 120 mg/dL

## 2021-12-10 ENCOUNTER — Ambulatory Visit: Payer: Self-pay | Admitting: Physician Assistant

## 2021-12-10 ENCOUNTER — Encounter: Payer: Self-pay | Admitting: Physician Assistant

## 2021-12-10 ENCOUNTER — Other Ambulatory Visit: Payer: Self-pay | Admitting: Physician Assistant

## 2021-12-10 VITALS — BP 123/81 | HR 56 | Temp 97.6°F | Wt 202.0 lb

## 2021-12-10 DIAGNOSIS — E785 Hyperlipidemia, unspecified: Secondary | ICD-10-CM

## 2021-12-10 DIAGNOSIS — Z1211 Encounter for screening for malignant neoplasm of colon: Secondary | ICD-10-CM

## 2021-12-10 DIAGNOSIS — I2510 Atherosclerotic heart disease of native coronary artery without angina pectoris: Secondary | ICD-10-CM

## 2021-12-10 DIAGNOSIS — F419 Anxiety disorder, unspecified: Secondary | ICD-10-CM

## 2021-12-10 DIAGNOSIS — R7303 Prediabetes: Secondary | ICD-10-CM

## 2021-12-10 DIAGNOSIS — I10 Essential (primary) hypertension: Secondary | ICD-10-CM

## 2021-12-10 NOTE — Progress Notes (Signed)
BP 123/81    Pulse (!) 56    Temp 97.6 F (36.4 C)    Wt 202 lb (91.6 kg)    SpO2 98%    BMI 28.98 kg/m    Subjective:    Patient ID: Edward Fisher, male    DOB: 06/30/1966, 56 y.o.   MRN: [id number]  HPI: Edward Fisher is a 56 y.o. male presenting on 12/10/2021 for Follow-up   HPI  Pt is 45yoM with routine follow up.  Pt says he got Shots in his back whic didn't help.   He is still working Administrator  He c/o not being as strong as he used to be   he also has the shaking  Pt says neurologist called shaking essential tremors.  He missed his counselor appt.   He hasn't been since December  Pt says that Every once in a while he has a CP.  He hasn't taken anything for it.  He has NTG but hasn't felt the need to use it.   He sees cardiology once/year.    He has been to neurologist and neurosurgeon.    He is currently working on financial assistance through [employer].      Relevant past medical, surgical, family and social history reviewed and updated as indicated. Interim medical history since our last visit reviewed. Allergies and medications reviewed and updated.    Current Outpatient Medications:    aspirin 81 MG chewable tablet, Chew 81 mg by mouth every evening. TO START DOSAGE IN 6 MONTHS APPROXIMATELY July 2014, Disp: , Rfl:    atorvastatin (LIPITOR) 40 MG tablet, Take 1 tablet (40 mg total) by mouth daily., Disp: 30 tablet, Rfl: 4   metoprolol succinate (TOPROL-XL) 25 MG 24 hr tablet, Take 1 tablet (25 mg total) by mouth daily., Disp: 30 tablet, Rfl: 6   omega-3 acid ethyl esters (LOVAZA) 1 G capsule, Take 1 g by mouth every evening., Disp: , Rfl:    tamsulosin (FLOMAX) 0.4 MG CAPS capsule, Take 1 capsule by mouth once daily, Disp: 30 capsule, Rfl: 0   clotrimazole-betamethasone (LOTRISONE) cream, Apply 1 application topically 2 (two) times daily. (Patient not taking: Reported on 12/10/2021), Disp: 30 g, Rfl: 0   nitroGLYCERIN (NITROSTAT) 0.4 MG SL tablet,  Place 1 tablet (0.4 mg total) under the tongue every 5 (five) minutes as needed for chest pain. (Patient not taking: Reported on 08/28/2021), Disp: 25 tablet, Rfl: PRN   ondansetron (ZOFRAN) 4 MG tablet, Take 1 tablet (4 mg total) by mouth every 8 (eight) hours as needed for nausea or vomiting. (Patient not taking: Reported on 08/28/2021), Disp: 4 tablet, Rfl: 0   oxyCODONE-acetaminophen (PERCOCET) 5-325 MG tablet, Take 1 tablet by mouth every 4 (four) hours as needed. (Patient not taking: Reported on 08/28/2021), Disp: 30 tablet, Rfl: 0    Review of Systems  Per HPI unless specifically indicated above     Objective:    BP 123/81    Pulse (!) 56    Temp 97.6 F (36.4 C)    Wt 202 lb (91.6 kg)    SpO2 98%    BMI 28.98 kg/m   Wt Readings from Last 3 Encounters:  12/10/21 202 lb (91.6 kg)  08/17/21 202 lb (91.6 kg)  06/24/21 200 lb (90.7 kg)    Physical Exam Vitals reviewed.  Constitutional:      General: He is not in acute distress.    Appearance: He is well-developed. He is not ill-appearing.  HENT:     Head: Normocephalic and atraumatic.  Cardiovascular:     Rate and Rhythm: Normal rate and regular rhythm.  Pulmonary:     Effort: Pulmonary effort is normal.     Breath sounds: Normal breath sounds. No wheezing.  Abdominal:     General: Bowel sounds are normal.     Palpations: Abdomen is soft.     Tenderness: There is no abdominal tenderness.  Musculoskeletal:     Cervical back: Neck supple.     Right lower leg: No edema.     Left lower leg: No edema.  Lymphadenopathy:     Cervical: No cervical adenopathy.  Skin:    General: Skin is warm and dry.  Neurological:     Mental Status: He is alert and oriented to person, place, and time.     Motor: Weakness and tremor present.     Gait: Gait abnormal.  Psychiatric:        Behavior: Behavior normal.    Results for orders placed or performed during the hospital encounter of 12/06/21  Hemoglobin A1c  Result Value Ref Range    Hgb A1c MFr Bld 5.8 (H) 4.8 - 5.6 %   Mean Plasma Glucose 120 mg/dL  Lipid panel  Result Value Ref Range   Cholesterol 114 0 - 200 mg/dL   Triglycerides 72 <150 mg/dL   HDL 33 (L) >40 mg/dL   Total CHOL/HDL Ratio 3.5 RATIO   VLDL 14 0 - 40 mg/dL   LDL Cholesterol 67 0 - 99 mg/dL  Comprehensive metabolic panel  Result Value Ref Range   Sodium 139 135 - 145 mmol/L   Potassium 4.2 3.5 - 5.1 mmol/L   Chloride 106 98 - 111 mmol/L   CO2 25 22 - 32 mmol/L   Glucose, Bld 115 (H) 70 - 99 mg/dL   BUN 17 6 - 20 mg/dL   Creatinine, Ser 0.92 0.61 - 1.24 mg/dL   Calcium 9.4 8.9 - 10.3 mg/dL   Total Protein 7.3 6.5 - 8.1 g/dL   Albumin 4.4 3.5 - 5.0 g/dL   AST 19 15 - 41 U/L   ALT 24 0 - 44 U/L   Alkaline Phosphatase 63 38 - 126 U/L   Total Bilirubin 0.6 0.3 - 1.2 mg/dL   GFR, Estimated >60 >60 mL/min   Anion gap 8 5 - 15  PSA  Result Value Ref Range   Prostatic Specific Antigen 1.12 0.00 - 4.00 ng/mL      Assessment & Plan:    Encounter Diagnoses  Name Primary?   Arteriosclerotic cardiovascular disease (ASCVD) Yes   Essential hypertension    Hyperlipidemia, unspecified hyperlipidemia type    Prediabetes    Screening for colon cancer    Anxiety        -Reviewed labs with pt -again encouraged pt to look into a Change of jobs, something that is less physically demanding -pt is rescheduled to see [facility] -Pt is given FIT test for colon cancer screening -pt encouraged to update enrollment with [facility] -pt to continue with neurologist and neurosurgery per their recommendations -pt to follow up here 4 months.  He is to contact office sooner prn

## 2021-12-25 ENCOUNTER — Ambulatory Visit: Payer: Self-pay | Admitting: Licensed Clinical Social Worker

## 2022-01-28 ENCOUNTER — Ambulatory Visit: Payer: No Typology Code available for payment source

## 2022-02-05 ENCOUNTER — Ambulatory Visit: Payer: No Typology Code available for payment source | Attending: Physician Assistant

## 2022-02-05 DIAGNOSIS — M5416 Radiculopathy, lumbar region: Secondary | ICD-10-CM | POA: Insufficient documentation

## 2022-02-05 DIAGNOSIS — M5412 Radiculopathy, cervical region: Secondary | ICD-10-CM | POA: Insufficient documentation

## 2022-02-05 DIAGNOSIS — M6281 Muscle weakness (generalized): Secondary | ICD-10-CM | POA: Insufficient documentation

## 2022-02-05 NOTE — Therapy (Signed)
[place] ?[facility] ?[place] ?[place], [place], [address] ?Phone: [phone]   Fax:  [phone] ? ?Physical Therapy Evaluation ? ?Patient Details  ?Name: Edward Fisher ?MRN: [id number] ?Date of Birth: [birth year] ?Referring Provider (PT): Dubicki, PA-C ? ? ?Encounter Date: 02/05/2022 ? ? PT End of Session - 02/05/22 1505   ? ? Visit Number 1   ? Number of Visits 8   ? Date for PT Re-Evaluation 04/19/22   ? PT Start Time 1510   ? PT Stop Time 2409   ? PT Time Calculation (min) 38 min   ? Activity Tolerance Patient tolerated treatment well   ? Behavior During Therapy [facility] for tasks assessed/performed   ? ?  ?  ? ?  ? ? ?Past Medical History:  ?Diagnosis Date  ? Arteriosclerotic cardiovascular disease (ASCVD)   ? Onset in 04/2012; CABG in 06/2012 w/ LIMA-LAD, L Rad-CFX, SVG-PL-PDA  ? Hyperlipidemia   ? goal LDL < 70  ? Insomnia   ? Nephrolithiasis   ? ? ?Past Surgical History:  ?Procedure Laterality Date  ? CORONARY ARTERY BYPASS GRAFT  06/24/2012  ? Procedure: CORONARY ARTERY BYPASS GRAFTING (CABG);  Surgeon: [name], MD;  Location: [place];  Service: Open Heart Surgery;  Laterality: N/A;  Coronary Artery  Bypass X 4 utilizing endoscopic saphenous vein graft and  Left radial Artery Harvest   ? CYSTOSCOPY W/ RETROGRADES Left 08/23/2015  ? Procedure: CYSTOSCOPY WITH RETROGRADE PYELOGRAM;  Surgeon: [name], MD;  [facility];  Service: Urology;  Laterality: Left;  ? CYSTOSCOPY WITH URETEROSCOPY, STONE BASKETRY AND STENT PLACEMENT Left 08/23/2015  ? Procedure: CYSTOSCOPY WITH URETEROSCOPY, STONE BASKET EXTRACTION;  Surgeon: [name], MD;  Location: [facility];  Service: Urology;  Laterality: Left;  ? HOLMIUM LASER APPLICATION Left 73/53/2992  ? Procedure: HOLMIUM LASER APPLICATION;  Surgeon: [name], MD;  Location[facility];  Service: Urology;  Laterality: Left;  ? LEFT HEART CATHETERIZATION WITH CORONARY ANGIOGRAM N/A 06/23/2012  ? Procedure: LEFT  HEART CATHETERIZATION WITH CORONARY ANGIOGRAM;  Surgeon: [name], MD;  Location: [facility] CATH LAB;  Service: Cardiovascular;  Laterality: N/A;  ? RHINOPLASTY    ? TONSILLECTOMY  ~1971  ? ? ?There were no vitals filed for this visit. ? ? ? Subjective Assessment - 02/05/22 1506   ? ? Subjective Patient reports that the middle of his back has been hurting for a while (1.5-2 years). However, his primary concern is the right side of his body being weak. He feels this has been going on for 2 years as well, but he was told these two issues are unrelated. He was told that he did not have Parkinson's Disease, but he believes that they do not know what is going on. He has tried physical therapy before, but he thinks that it may have helped while he was completing therapy, but he did not continue to exercise after stopping therapy.He feels that symptoms have been fairly steady in the last few months. He has just began to do some exercises on Youtube for his tremor. He has tried injections in his back previously, but these did not help any.   ? Pertinent History history of CABG x3, OA   ? Limitations Standing;Walking;House hold activities   ? How long can you walk comfortably? unsure   ? Patient Stated Goals play musical instruments, improved ease with work activiites   ? Currently in Pain? Yes   ? Pain Score 7    ?  Pain Location Back   ? Pain Orientation Mid   ? Pain Descriptors / Indicators Numbness;Aching   ? Pain Type Chronic pain   ? Pain Radiating Towards patient reports that this pain radiates locally   ? Pain Onset More than a month ago   ? Pain Frequency Intermittent   ? Aggravating Factors  working, standing, washing dishes   ? Pain Relieving Factors sitting upright   ? Effect of Pain on Daily Activities limits his ability to stand   ? ?  ?  ? ?  ? ? ? ? ? [facility] PT Assessment - 02/05/22 0001   ? ?  ? Assessment  ? Medical Diagnosis Lumbar spondylosis; Chronic bilateral throacic back pain; Cervical spondylosis   ?  Referring Provider (PT) Dubicki, PA-C   ? Hand Dominance Right   ? Next MD Visit None   ? Prior Therapy yes   ?  ? Precautions  ? Precautions Fall   ?  ? Restrictions  ? Weight Bearing Restrictions No   ?  ? Balance Screen  ? Has the patient fallen in the past 6 months Yes   ? How many times? unknown   will lose his balance often because he will not pick his foot up enough to clear the obsitcles as work  ? Has the patient had a decrease in activity level because of a fear of falling?  Yes   ? Is the patient reluctant to leave their home because of a fear of falling?  No   ?  ? Home Environment  ? Living Environment Private residence   ? Living Arrangements Alone   ? Home Access Stairs to enter   ? Entrance Stairs-Number of Steps 5   ? Entrance Stairs-Rails Can reach both   ? Home Layout One level   ? Home Equipment None   ?  ? Prior Function  ? Level of Independence Independent   ? Vocation Full time employment   ? Vocation Requirements lift up to 50 pounds   ? Leisure play music, spend time whith his grandchildren   ?  ? Cognition  ? Overall Cognitive Status Within Functional Limits for tasks assessed   ? Attention Focused   ? Focused Attention Appears intact   ? Memory Appears intact   ? Awareness Appears intact   ? Problem Solving Appears intact   ?  ? Sensation  ? Additional Comments Patient reports no numbness or tingling   ?  ? Posture/Postural Control  ? Posture/Postural Control Postural limitations   ? Postural Limitations Rounded Shoulders;Forward head;Increased thoracic kyphosis   ?  ? Deep Tendon Reflexes  ? DTR Assessment Site Patella;Achilles   ? Patella DTR 0;1+   R: 0; L: 1+  ? Achilles DTR 1+   L:1+ R: 1+  ?  ? ROM / Strength  ? AROM / PROM / Strength Strength;AROM   ?  ? AROM  ? Overall AROM Comments Shoulder AROM equal bilaterally and WFL   ? Lumbar Flexion 30   "unsteady"  ? Lumbar Extension 10   ? Lumbar - Right Rotation 75% limited   "tight"  ? Lumbar - Left Rotation 75% limited   ?  ? Strength   ? Strength Assessment Site Shoulder;Hand;Hip;Knee;Ankle   ? Right/Left Shoulder Right;Left   ? Right Shoulder Flexion 3+/5   ? Right Shoulder ABduction 4-/5   ? Left Shoulder Flexion 4-/5   ? Left Shoulder ABduction 3+/5   ? Right Hand  Grip (lbs) 70   ? Left Hand Grip (lbs) 100   ? Right/Left Hip Left;Right   ? Right Hip Flexion 3/5   ? Right Hip ABduction 3+/5   ? Right/Left Knee Right;Left   ? Right Knee Flexion 3+/5   ? Right Knee Extension 3+/5   ? Left Knee Flexion 4/5   ? Left Knee Extension 4/5   ? Right/Left Ankle Right;Left   ? Right Ankle Dorsiflexion 3/5   ? Left Ankle Dorsiflexion 4/5   ?  ? Palpation  ? Palpation comment No tenderness to palpation   ?  ? Special Tests  ?  Special Tests Cervical   ? Cervical Tests other;other2   ?  ? other   ? Findings Negative   ? Comment Alar ligament   ?  ? other   ? Findings Negative   ? Comment Sharp Purser   ?  ? Transfers  ? Five time sit to stand comments  20.3 seconds; no UE support   ?  ? Ambulation/Gait  ? Ambulation/Gait Yes   ? Ambulation/Gait Assistance 6: Modified independent (Device/Increase time)   ? Assistive device None   ? Gait Pattern Step-through pattern;Decreased stride length;Decreased step length - right;Decreased weight shift to right;Decreased dorsiflexion - right;Right foot flat;Poor foot clearance - right   ? Ambulation Surface Level;Indoor   ? Gait velocity decresed   ?  ? Balance  ? Balance Assessed Yes   ?  ? Static Standing Balance  ? Static Standing Balance -  Activities  Romberg - Eyes Opened;Tandam Stance - Left Leg;Tandam Stance - Right Leg   ? Static Standing - Comment/# of Minutes Rhomberg: 30 seconds with increased sway; Tandem: 2 seconds bilaterally   ? ?  ?  ? ?  ? ? ? ? ? ? ? ? ? ? ? ? ? ?Objective measurements completed on examination: See above findings.  ? ? ? ? ? ? ? ? ? ? ? ? ? ? ? ? PT Short Term Goals - 01/29/21 1318   ? ?  ? PT SHORT TERM GOAL #1  ? Title STG=LTG   ? ?  ?  ? ?  ? ? ? ? PT Long Term Goals - 02/05/22 1626    ? ?  ? PT LONG TERM GOAL #1  ? Title Patient will be independent with his HEP.   ? Time 4   ? Period Weeks   ? Status New   ? Target Date 03/05/22   ?  ? PT LONG TERM GOAL #2  ? Title Patient will impro

## 2022-02-07 ENCOUNTER — Ambulatory Visit: Payer: No Typology Code available for payment source

## 2022-02-07 DIAGNOSIS — M6281 Muscle weakness (generalized): Secondary | ICD-10-CM

## 2022-02-07 NOTE — Therapy (Signed)
[place] ?[facility] ?[address] ?[place], [place], [address] ?Phone: [phone]   Fax:  [phone] ? ?Physical Therapy Treatment ? ?Patient Details  ?Name: Edward Fisher ?MRN: [id number] ?Date of Birth: December 03, 1965 ?Referring Provider (PT): Dubicki, PA-C ? ? ?Encounter Date: 02/07/2022 ? ? PT End of Session - 02/07/22 1639   ? ? Visit Number 2   ? Number of Visits 8   ? Date for PT Re-Evaluation 04/19/22   ? PT Start Time 8127   ? PT Stop Time 5170   ? PT Time Calculation (min) 45 min   ? Activity Tolerance Patient tolerated treatment well   ? Behavior During Therapy [facility] for tasks assessed/performed   ? ?  ?  ? ?  ? ? ?Past Medical History:  ?Diagnosis Date  ? Arteriosclerotic cardiovascular disease (ASCVD)   ? Onset in 04/2012; CABG in 06/2012 w/ LIMA-LAD, L Rad-CFX, SVG-PL-PDA  ? Hyperlipidemia   ? goal LDL < 70  ? Insomnia   ? Nephrolithiasis   ? ? ?Past Surgical History:  ?Procedure Laterality Date  ? CORONARY ARTERY BYPASS GRAFT  06/24/2012  ? Procedure: CORONARY ARTERY BYPASS GRAFTING (CABG);  Surgeon: [name], MD;  Location: [facility];  Service: Open Heart Surgery;  Laterality: N/A;  Coronary Artery  Bypass X 4 utilizing endoscopic saphenous vein graft and  Left radial Artery Harvest   ? CYSTOSCOPY W/ RETROGRADES Left 08/23/2015  ? Procedure: CYSTOSCOPY WITH RETROGRADE PYELOGRAM;  Surgeon: [name], MD;  [facility];  Service: Urology;  Laterality: Left;  ? CYSTOSCOPY WITH URETEROSCOPY, STONE BASKETRY AND STENT PLACEMENT Left 08/23/2015  ? Procedure: CYSTOSCOPY WITH URETEROSCOPY, STONE BASKET EXTRACTION;  Surgeon: [name], MD;  [facility];  Service: Urology;  Laterality: Left;  ? HOLMIUM LASER APPLICATION Left 01/74/9449  ? Procedure: HOLMIUM LASER APPLICATION;  Surgeon: [name], MD;  [facility];  Service: Urology;  Laterality: Left;  ? LEFT HEART CATHETERIZATION WITH CORONARY ANGIOGRAM N/A 06/23/2012  ? Procedure: LEFT  HEART CATHETERIZATION WITH CORONARY ANGIOGRAM;  Surgeon: [name], MD;  Location: [facility] CATH LAB;  Service: Cardiovascular;  Laterality: N/A;  ? RHINOPLASTY    ? TONSILLECTOMY  ~1971  ? ? ?There were no vitals filed for this visit. ? ? Subjective Assessment - 02/07/22 1639   ? ? Subjective Patient reports that his back is "killing me today". However, he notes that it is normally worst while he is at work. He feels that it has gotten better since he left work.   ? Pertinent History history of CABG x3, OA   ? Limitations Standing;Walking;House hold activities   ? How long can you walk comfortably? unsure   ? Patient Stated Goals play musical instruments, improved ease with work activiites   ? Currently in Pain? Yes   ? Pain Score 8    ? Pain Location Back   ? Pain Onset More than a month ago   ? ?  ?  ? ?  ? ? ? ? ? ? ? ? ? ? ? ? ? ? ? ? ? ? ? ? [place] [facility] PT Treatment/Exercise - 02/07/22 0001   ? ?  ? Exercises  ? Exercises Lumbar;Knee/Hip   ?  ? Lumbar Exercises: Aerobic  ? Nustep L3 x 15 minutes   ?  ? Lumbar Exercises: Standing  ? Other Standing Lumbar Exercises Wall push up   25 reps  ?  ? Lumbar Exercises: Seated  ?  Long [employer] on Chair Both;Weights   2 minutes  ? LAQ on Chair Weights (lbs) 2   ? Other Seated Lumbar Exercises Marching   2 minutes; 2 lb ankle weights  ? Other Seated Lumbar Exercises [place] bending   up and down; 3 minutes  ?  ? Knee/Hip Exercises: Standing  ? Lateral Step Up Both;Hand Hold: 2;Step Height: 6"   3 minutes  ? Rocker Board 5 minutes   ?  ? Knee/Hip Exercises: Seated  ? Other Seated Knee/Hip Exercises Horizontal ABD   20 reps; red t-band  ? Other Seated Knee/Hip Exercises B shoulder ER   red t-band; 20 reps  ? ?  ?  ? ?  ? ? ? ? ? ? ? ? ? ? ? ? ? ? ? PT Long Term Goals - 02/05/22 1626   ? ?  ? PT LONG TERM GOAL #1  ? Title Patient will be independent with his HEP.   ? Time 4   ? Period Weeks   ? Status New   ? Target Date 03/05/22   ?  ? PT LONG TERM GOAL #2  ? Title Patient  will improve his five time sit to stand time to 12 seconds or less for improved safety.   ? Time 4   ? Period Weeks   ? Status New   ? Target Date 03/05/22   ?  ? PT LONG TERM GOAL #3  ? Title Patient will be able to demonstrate improved right foot clearance for improved safety with ambulation.   ? Time 4   ? Period Weeks   ? Status New   ? Target Date 03/05/22   ? ?  ?  ? ?  ? ? ? ? ? ? ? ? Plan - 02/07/22 1702   ? ? Clinical Impression Statement Patient was introduced to multiple new interventions with moderate difficulty and fatigue. Muscular fatigue was his primary limitation with today's interventions, but this was able to be reduced with seated rest breaks and alternating upper and lower extremity interventions. He required minimal cuing with today's interventions for proper biomechanics. He reported that his back was feeling better upon the conclusion of treatment as it was only at 3/10. He continues to require skilled physical therapy to address his remaining impairments to maximize his functional mobility.   ? Personal Factors and Comorbidities Comorbidity 1;Other;Time since onset of injury/illness/exacerbation   ? Comorbidities history of CABG x3, HTN   ? Examination-Activity Limitations Locomotion Level;Transfers;Bend;Carry;Squat;Stand;Lift   ? Examination-Participation Restrictions Meal Prep;Cleaning;Occupation;Community Activity;[employer] Work   ? Stability/Clinical Decision Making Unstable/Unpredictable   ? Rehab Potential Fair   ? PT Frequency 2x / week   ? PT Duration 4 weeks   ? PT Treatment/Interventions Gait training;Moist Heat;Functional mobility training;Therapeutic activities;Therapeutic exercise;Balance training;Neuromuscular re-education;Manual techniques;Dry needling;Taping;Electrical Stimulation;Cryotherapy;Traction   ? PT Next Visit Plan nustep, global strengthening (R>L)   ? Consulted and Agree with Plan of Care Patient   ? ?  ?  ? ?  ? ? ?Patient will benefit from skilled therapeutic  intervention in order to improve the following deficits and impairments:  Abnormal gait, Difficulty walking, Decreased activity tolerance, Pain, Decreased balance, Hypomobility, Decreased strength, Decreased mobility, Postural dysfunction ? ?Visit Diagnosis: ?Muscle weakness (generalized) ? ? ? ? ?Problem List ?Patient Active Problem List  ? Diagnosis Date Noted  ? Tremor of right hand 05/02/2020  ? 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 Hemiparesis of right dominant side ([place]) 05/02/2020  ?  Chest pain 06/22/2014  ? Essential hypertension 06/22/2014  ? Belching 06/22/2014  ? Local skin infection 03/16/2013  ? Arteriosclerotic cardiovascular disease (ASCVD) 06/26/2012  ? Hyperlipidemia   ? ? ?Darlin Coco, PT ?02/07/2022, 5:27 PM ? ?[place] ?[facility] ?[address] ?[place], [place], [address] ?Phone: [phone]   Fax:  [phone] ? ?Name: Edward Fisher ?MRN: [id number] ?Date of Birth: [birth year] ? ? ? ?

## 2022-02-12 ENCOUNTER — Ambulatory Visit: Payer: No Typology Code available for payment source

## 2022-02-12 DIAGNOSIS — M6281 Muscle weakness (generalized): Secondary | ICD-10-CM

## 2022-02-12 NOTE — Therapy (Signed)
[place] [facility] ?[place] ?[place], [place], [address] ?Phone: [phone]   Fax:  [phone] ? ?Physical Therapy Treatment ? ?Patient Details  ?Name: Edward Fisher ?MRN: [id number] ?Date of Birth: 1966-10-25 ?Referring Provider (PT): Dubicki, PA-C ? ? ?Encounter Date: 02/12/2022 ? ? PT End of Session - 02/12/22 1637   ? ? Visit Number 3   ? Number of Visits 8   ? Date for PT Re-Evaluation 04/19/22   ? PT Start Time 0017   ? PT Stop Time 1718   ? PT Time Calculation (min) 43 min   ? Activity Tolerance Patient tolerated treatment well   ? Behavior During Therapy [facility] for tasks assessed/performed   ? ?  ?  ? ?  ? ? ?Past Medical History:  ?Diagnosis Date  ? Arteriosclerotic cardiovascular disease (ASCVD)   ? Onset in 04/2012; CABG in 06/2012 w/ LIMA-LAD, L Rad-CFX, SVG-PL-PDA  ? Hyperlipidemia   ? goal LDL < 70  ? Insomnia   ? Nephrolithiasis   ? ? ?Past Surgical History:  ?Procedure Laterality Date  ? CORONARY ARTERY BYPASS GRAFT  06/24/2012  ? Procedure: CORONARY ARTERY BYPASS GRAFTING (CABG);  Surgeon: [name], MD;  Location: [place];  Service: Open Heart Surgery;  Laterality: N/A;  Coronary Artery  Bypass X 4 utilizing endoscopic saphenous vein graft and  Left radial Artery Harvest   ? CYSTOSCOPY W/ RETROGRADES Left 08/23/2015  ? Procedure: CYSTOSCOPY WITH RETROGRADE PYELOGRAM;  Surgeon: [name], MD;  [facility];  Service: Urology;  Laterality: Left;  ? CYSTOSCOPY WITH URETEROSCOPY, STONE BASKETRY AND STENT PLACEMENT Left 08/23/2015  ? Procedure: CYSTOSCOPY WITH URETEROSCOPY, STONE BASKET EXTRACTION;  Surgeon: [name], MD;  Location: [facility];  Service: Urology;  Laterality: Left;  ? HOLMIUM LASER APPLICATION Left 49/44/9675  ? Procedure: HOLMIUM LASER APPLICATION;  Surgeon: [name], MD;  Location[facility];  Service: Urology;  Laterality: Left;  ? LEFT HEART CATHETERIZATION WITH CORONARY ANGIOGRAM N/A 06/23/2012  ? Procedure: LEFT  HEART CATHETERIZATION WITH CORONARY ANGIOGRAM;  Surgeon: [name], MD;  Location: [facility] CATH LAB;  Service: Cardiovascular;  Laterality: N/A;  ? RHINOPLASTY    ? TONSILLECTOMY  ~1971  ? ? ?There were no vitals filed for this visit. ? ? Subjective Assessment - 02/12/22 1636   ? ? Subjective Patient reports that his back is tired today. He notes that his back was getting up to a 7-8/10 while he was at work today.   ? Pertinent History history of CABG x3, OA   ? Limitations Standing;Walking;House hold activities   ? How long can you walk comfortably? unsure   ? Patient Stated Goals play musical instruments, improved ease with work activiites   ? Currently in Pain? Yes   ? Pain Score 3    ? Pain Location Back   ? Pain Onset More than a month ago   ? ?  ?  ? ?  ? ? ? ? ? ? ? ? ? ? ? ? ? ? ? ? ? ? ? ? [place] Adult PT Treatment/Exercise - 02/12/22 0001   ? ?  ? Lumbar Exercises: Aerobic  ? Nustep L4 x 15 minutes   ?  ? Lumbar Exercises: Machines for Strengthening  ? Cybex Knee Flexion 40# x 2 minutes   ? Other Lumbar Machine Exercise Row   30#; 2 minutes; BUE  ?  ? Lumbar Exercises: Standing  ? Shoulder Extension Both  2 minutes  ? Shoulder Extension Limitations Blue XTS   ?  ? Lumbar Exercises: Seated  ? Long [employer] on Chair Both;Weights   ? LAQ on Chair Weights (lbs) 3   ? Sit to Stand 20 reps   without UE support  ?  ? Knee/Hip Exercises: Standing  ? Lateral Step Up Both;Hand Hold: 2;Step Height: 6"   3 minutes  ? Rocker Board 5 minutes   ? Other Standing Knee Exercises Side stepping   foam pad; 2 minutes; BUE support  ? ?  ?  ? ?  ? ? ? ? ? ? ? ? ? ? ? ? ? ? ? PT Long Term Goals - 02/05/22 1626   ? ?  ? PT LONG TERM GOAL #1  ? Title Patient will be independent with his HEP.   ? Time 4   ? Period Weeks   ? Status New   ? Target Date 03/05/22   ?  ? PT LONG TERM GOAL #2  ? Title Patient will improve his five time sit to stand time to 12 seconds or less for improved safety.   ? Time 4   ? Period Weeks   ? Status New    ? Target Date 03/05/22   ?  ? PT LONG TERM GOAL #3  ? Title Patient will be able to demonstrate improved right foot clearance for improved safety with ambulation.   ? Time 4   ? Period Weeks   ? Status New   ? Target Date 03/05/22   ? ?  ?  ? ?  ? ? ? ? ? ? ? ? Plan - 02/12/22 1637   ? ? Clinical Impression Statement Patient was progressed with multiple new interventions for improved thoracic and lumbar strength and stability with moderate difficulty. He required minimal cuing with today's new interventions for proper exercise performance. Fatigue was his primary limitation with today's interventions as he required brief rest breaks between interventions. He reported feeling a little tired upon the conclusion of treatment. He continues to require skilled physical therapy to address his remaining impairments to return to his prior level of function.   ? Personal Factors and Comorbidities Comorbidity 1;Other;Time since onset of injury/illness/exacerbation   ? Comorbidities history of CABG x3, HTN   ? Examination-Activity Limitations Locomotion Level;Transfers;Bend;Carry;Squat;Stand;Lift   ? Examination-Participation Restrictions Meal Prep;Cleaning;Occupation;Community Activity;[employer] Work   ? Stability/Clinical Decision Making Unstable/Unpredictable   ? Rehab Potential Fair   ? PT Frequency 2x / week   ? PT Duration 4 weeks   ? PT Treatment/Interventions Gait training;Moist Heat;Functional mobility training;Therapeutic activities;Therapeutic exercise;Balance training;Neuromuscular re-education;Manual techniques;Dry needling;Taping;Electrical Stimulation;Cryotherapy;Traction   ? PT Next Visit Plan nustep, global strengthening (R>L)   ? Consulted and Agree with Plan of Care Patient   ? ?  ?  ? ?  ? ? ?Patient will benefit from skilled therapeutic intervention in order to improve the following deficits and impairments:  Abnormal gait, Difficulty walking, Decreased activity tolerance, Pain, Decreased balance, Hypomobility,  Decreased strength, Decreased mobility, Postural dysfunction ? ?Visit Diagnosis: ?Muscle weakness (generalized) ? ? ? ? ?Problem List ?Patient Active Problem List  ? Diagnosis Date Noted  ? Tremor of right hand 05/02/2020  ? 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 Hemiparesis of right dominant side ([place]) 05/02/2020  ? Chest pain 06/22/2014  ? Essential hypertension 06/22/2014  ? Belching 06/22/2014  ? Local skin infection 03/16/2013  ? Arteriosclerotic cardiovascular disease (ASCVD) 06/26/2012  ? Hyperlipidemia   ? ? ?  Darlin Coco, PT ?02/12/2022, 5:20 PM ? ?[place] ?[facility] ?[place] ?[place], [place], [address] ?Phone: [phone]   Fax:  [phone] ? ?Name: Edward Fisher ?MRN: [id number] ?Date of Birth: 05/21/1966 ? ? ? ?

## 2022-02-14 ENCOUNTER — Ambulatory Visit: Payer: No Typology Code available for payment source

## 2022-02-14 DIAGNOSIS — M6281 Muscle weakness (generalized): Secondary | ICD-10-CM

## 2022-02-14 NOTE — Therapy (Signed)
[place] ?[facility] ?[place] ?[place], [place], [address] ?Phone: [phone]   Fax:  [phone] ? ?Physical Therapy Treatment ? ?Patient Details  ?Name: Edward Fisher ?MRN: [id number] ?Date of Birth: 02/11/1966 ?Referring Provider (PT): Dubicki, PA-C ? ? ?Encounter Date: 02/14/2022 ? ? PT End of Session - 02/14/22 1620   ? ? Visit Number 4   ? Number of Visits 8   ? Date for PT Re-Evaluation 04/19/22   ? PT Start Time [phone]   ? PT Stop Time 3235   ? PT Time Calculation (min) 46 min   ? Activity Tolerance Patient tolerated treatment well   ? Behavior During Therapy [facility] for tasks assessed/performed   ? ?  ?  ? ?  ? ? ?Past Medical History:  ?Diagnosis Date  ? Arteriosclerotic cardiovascular disease (ASCVD)   ? Onset in 04/2012; CABG in 06/2012 w/ LIMA-LAD, L Rad-CFX, SVG-PL-PDA  ? Hyperlipidemia   ? goal LDL < 70  ? Insomnia   ? Nephrolithiasis   ? ? ?Past Surgical History:  ?Procedure Laterality Date  ? CORONARY ARTERY BYPASS GRAFT  06/24/2012  ? Procedure: CORONARY ARTERY BYPASS GRAFTING (CABG);  Surgeon: [name], MD;  Location: [place];  Service: Open Heart Surgery;  Laterality: N/A;  Coronary Artery  Bypass X 4 utilizing endoscopic saphenous vein graft and  Left radial Artery Harvest   ? CYSTOSCOPY W/ RETROGRADES Left 08/23/2015  ? Procedure: CYSTOSCOPY WITH RETROGRADE PYELOGRAM;  Surgeon: [name], MD;  [facility];  Service: Urology;  Laterality: Left;  ? CYSTOSCOPY WITH URETEROSCOPY, STONE BASKETRY AND STENT PLACEMENT Left 08/23/2015  ? Procedure: CYSTOSCOPY WITH URETEROSCOPY, STONE BASKET EXTRACTION;  Surgeon: [name], MD;  [facility];  Service: Urology;  Laterality: Left;  ? HOLMIUM LASER APPLICATION Left 57/32/2025  ? Procedure: HOLMIUM LASER APPLICATION;  Surgeon: [name], MD;  [facility];  Service: Urology;  Laterality: Left;  ? LEFT HEART CATHETERIZATION WITH CORONARY ANGIOGRAM N/A 06/23/2012  ? Procedure: LEFT  HEART CATHETERIZATION WITH CORONARY ANGIOGRAM;  Surgeon: [name], MD;  Location[facility] C[facility] LAB;  Service: Cardiovascular;  Laterality: N/A;  ? RHINOPLASTY    ? TONSILLECTOMY  ~1971  ? ? ?There were no vitals filed for this visit. ? ? Subjective Assessment - 02/14/22 1615   ? ? Subjective Patient reports that he feels alright today.   ? Pertinent History history of CABG x3, OA   ? Limitations Standing;Walking;House hold activities   ? How long can you walk comfortably? unsure   ? Patient Stated Goals play musical instruments, improved ease with work activiites   ? Currently in Pain? No/denies   ? Pain Onset More than a month ago   ? ?  ?  ? ?  ? ? ? ? ? ? ? ? ? ? ? ? ? ? ? ? ? ? ? ? [place] Adult PT Treatment/Exercise - 02/14/22 0001   ? ?  ? Lumbar Exercises: Aerobic  ? Nustep L4 x 15 minutes   ?  ? Lumbar Exercises: Machines for Strengthening  ? Cybex Knee Extension 10# x 3 minutes   ? Cybex Knee Flexion 40# x 3 minutes   ?  ? Lumbar Exercises: Standing  ? Row Both;20 reps   ? Row Limitations Blue XTS   ? Shoulder Extension Both;20 reps   ? Shoulder Extension Limitations Blue XTS   ? Other Standing Lumbar Exercises Horizontal ABD (shoulder)   red t-band;  20 reps  ?  ? Knee/Hip Exercises: Standing  ? Hip Flexion Both;20 reps;Knee straight   ? Hip Flexion Limitations 4lb ankle weight   ? Hip Abduction Both;20 reps;Knee straight   ? Abduction Limitations 4lb ankle weight   ? Hip Extension Both;Knee straight;20 reps   ? Extension Limitations 4lb ankle weight   ? Rocker Board 5 minutes   ? Other Standing Knee Exercises Bilateral ER   red t-band; 20 reps  ? ?  ?  ? ?  ? ? ? ? ? ? ? ? ? ? ? ? ? ? ? PT Long Term Goals - 02/05/22 1626   ? ?  ? PT LONG TERM GOAL #1  ? Title Patient will be independent with his HEP.   ? Time 4   ? Period Weeks   ? Status New   ? Target Date 03/05/22   ?  ? PT LONG TERM GOAL #2  ? Title Patient will improve his five time sit to stand time to 12 seconds or less for improved safety.   ? Time 4    ? Period Weeks   ? Status New   ? Target Date 03/05/22   ?  ? PT LONG TERM GOAL #3  ? Title Patient will be able to demonstrate improved right foot clearance for improved safety with ambulation.   ? Time 4   ? Period Weeks   ? Status New   ? Target Date 03/05/22   ? ?  ?  ? ?  ? ? ? ? ? ? ? ? Plan - 02/14/22 1657   ? ? Clinical Impression Statement Patient was introduced to multiple new upper and lower extremity interventions for improved strength with moderate difficulty and fatigue. He required minimal cuing throughout treatment for proper exercise performance to to avoid compensation patterns as he became fatigued. He reported feeling tired upon the conclusion of treatment. He continues to require skilled physical therapy to address his remaining impairments to maximize his functional mobility.   ? Personal Factors and Comorbidities Comorbidity 1;Other;Time since onset of injury/illness/exacerbation   ? Comorbidities history of CABG x3, HTN   ? Examination-Activity Limitations Locomotion Level;Transfers;Bend;Carry;Squat;Stand;Lift   ? Examination-Participation Restrictions Meal Prep;Cleaning;Occupation;Community Activity;[employer] Work   ? Stability/Clinical Decision Making Unstable/Unpredictable   ? Rehab Potential Fair   ? PT Frequency 2x / week   ? PT Duration 4 weeks   ? PT Treatment/Interventions Gait training;Moist Heat;Functional mobility training;Therapeutic activities;Therapeutic exercise;Balance training;Neuromuscular re-education;Manual techniques;Dry needling;Taping;Electrical Stimulation;Cryotherapy;Traction   ? PT Next Visit Plan nustep, global strengthening (R>L)   ? Consulted and Agree with Plan of Care Patient   ? ?  ?  ? ?  ? ? ?Patient will benefit from skilled therapeutic intervention in order to improve the following deficits and impairments:  Abnormal gait, Difficulty walking, Decreased activity tolerance, Pain, Decreased balance, Hypomobility, Decreased strength, Decreased mobility, Postural  dysfunction ? ?Visit Diagnosis: ?Muscle weakness (generalized) ? ? ? ? ?Problem List ?Patient Active Problem List  ? Diagnosis Date Noted  ? Tremor of right hand 05/02/2020  ? 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 Hemiparesis of right dominant side ([place]) 05/02/2020  ? Chest pain 06/22/2014  ? Essential hypertension 06/22/2014  ? Belching 06/22/2014  ? Local skin infection 03/16/2013  ? Arteriosclerotic cardiovascular disease (ASCVD) 06/26/2012  ? Hyperlipidemia   ? ? ?Darlin Coco, PT ?02/14/2022, 5:18 PM ? ?[place] ?[facility] ?[place] ?[place], [place], [address] ?Phone: [phone]  Fax:  [phone] ? ?Name: ZAI CHMIEL ?MRN: [id number] ?Date of Birth: 1966/05/21 ? ? ? ?

## 2022-02-15 ENCOUNTER — Ambulatory Visit: Payer: No Typology Code available for payment source | Admitting: Urology

## 2022-02-19 ENCOUNTER — Encounter: Payer: No Typology Code available for payment source | Admitting: *Deleted

## 2022-02-21 ENCOUNTER — Ambulatory Visit: Payer: No Typology Code available for payment source | Admitting: Physical Therapy

## 2022-02-21 DIAGNOSIS — M5416 Radiculopathy, lumbar region: Secondary | ICD-10-CM

## 2022-02-21 DIAGNOSIS — M6281 Muscle weakness (generalized): Secondary | ICD-10-CM

## 2022-02-21 DIAGNOSIS — M5412 Radiculopathy, cervical region: Secondary | ICD-10-CM

## 2022-02-21 NOTE — Therapy (Signed)
[place] ?[facility] ?[place] ?[place], [place], [address] ?Phone: [phone]   Fax:  [phone] ? ?Physical Therapy Treatment ? ?Patient Details  ?Name: Edward Fisher ?MRN: [id number] ?Date of Birth: 09-23-1966 ?Referring Provider (PT): Dubicki, PA-C ? ? ?Encounter Date: 02/21/2022 ? ? PT End of Session - 02/21/22 1742   ? ? Visit Number 5   ? Number of Visits 8   ? Date for PT Re-Evaluation 04/19/22   ? PT Start Time [phone]   ? PT Stop Time 0532   ? PT Time Calculation (min) 47 min   ? Activity Tolerance Patient tolerated treatment well   ? Behavior During Therapy [facility] for tasks assessed/performed   ? ?  ?  ? ?  ? ? ?Past Medical History:  ?Diagnosis Date  ? Arteriosclerotic cardiovascular disease (ASCVD)   ? Onset in 04/2012; CABG in 06/2012 w/ LIMA-LAD, L Rad-CFX, SVG-PL-PDA  ? Hyperlipidemia   ? goal LDL < 70  ? Insomnia   ? Nephrolithiasis   ? ? ?Past Surgical History:  ?Procedure Laterality Date  ? CORONARY ARTERY BYPASS GRAFT  06/24/2012  ? Procedure: CORONARY ARTERY BYPASS GRAFTING (CABG);  Surgeon: [name], MD;  Location: [place];  Service: Open Heart Surgery;  Laterality: N/A;  Coronary Artery  Bypass X 4 utilizing endoscopic saphenous vein graft and  Left radial Artery Harvest   ? CYSTOSCOPY W/ RETROGRADES Left 08/23/2015  ? Procedure: CYSTOSCOPY WITH RETROGRADE PYELOGRAM;  Surgeon: [name], MD;  [facility];  Service: Urology;  Laterality: Left;  ? CYSTOSCOPY WITH URETEROSCOPY, STONE BASKETRY AND STENT PLACEMENT Left 08/23/2015  ? Procedure: CYSTOSCOPY WITH URETEROSCOPY, STONE BASKET EXTRACTION;  Surgeon: [name], MD;  [facility];  Service: Urology;  Laterality: Left;  ? HOLMIUM LASER APPLICATION Left 11/06/7251  ? Procedure: HOLMIUM LASER APPLICATION;  Surgeon: [name], MD;  [facility];  Service: Urology;  Laterality: Left;  ? LEFT HEART CATHETERIZATION WITH CORONARY ANGIOGRAM N/A 06/23/2012  ? Procedure: LEFT  HEART CATHETERIZATION WITH CORONARY ANGIOGRAM;  Surgeon: [name], MD;  Location: [facility] CATH LAB;  Service: Cardiovascular;  Laterality: N/A;  ? RHINOPLASTY    ? TONSILLECTOMY  ~1971  ? ? ?There were no vitals filed for this visit. ? ? Subjective Assessment - 02/21/22 1654   ? ? Subjective Doing okay today.  Patient asked about traction today as his doctor had suggested it.  I told him we will try some manual traction today and based on his response can proceed with mechanical.  His CC is pain down his right UE.   ? Pertinent History history of CABG x3, OA   ? Limitations Standing;Walking;House hold activities   ? How long can you walk comfortably? unsure   ? Patient Stated Goals play musical instruments, improved ease with work activiites   ? Currently in Pain? Yes   ? Pain Score 2    ? Pain Location Back   ? Pain Orientation Mid   ? Pain Descriptors / Indicators Numbness;Aching   ? Pain Type Chronic pain   ? Pain Onset More than a month ago   ? ?  ?  ? ?  ? ? ? ? ? ? ? ? ? ? ? ? ? ? ? ? ? ? ? ? [place] Adult PT Treatment/Exercise - 02/21/22 0001   ? ?  ? Exercises  ? Exercises Knee/Hip   ?  ? Lumbar Exercises: Aerobic  ? Nustep Level  3 x 15 minutes.   ?  ? Manual Therapy  ? Manual therapy comments In supine:  STW/M to patient's right cervical spine and manual traction (23 minutes total).   ? ?  ?  ? ?  ? ? ? ? ? ? ? ? ? ? ? ? ? ? ? PT Long Term Goals - 02/05/22 1626   ? ?  ? PT LONG TERM GOAL #1  ? Title Patient will be independent with his HEP.   ? Time 4   ? Period Weeks   ? Status New   ? Target Date 03/05/22   ?  ? PT LONG TERM GOAL #2  ? Title Patient will improve his five time sit to stand time to 12 seconds or less for improved safety.   ? Time 4   ? Period Weeks   ? Status New   ? Target Date 03/05/22   ?  ? PT LONG TERM GOAL #3  ? Title Patient will be able to demonstrate improved right foot clearance for improved safety with ambulation.   ? Time 4   ? Period Weeks   ? Status New   ? Target Date 03/05/22    ? ?  ?  ? ?  ? ? ? ? ? ? ? ? Plan - 02/21/22 1739   ? ? Clinical Impression Statement The patient did very well today with STW/M and manual traction to his cervical spine.  He stating feeling good during and after treatment.   ? Personal Factors and Comorbidities Comorbidity 1;Other;Time since onset of injury/illness/exacerbation   ? Comorbidities history of CABG x3, HTN   ? Examination-Activity Limitations Locomotion Level;Transfers;Bend;Carry;Squat;Stand;Lift   ? Examination-Participation Restrictions Meal Prep;Cleaning;Occupation;Community Activity;[employer] Work   ? Stability/Clinical Decision Making Unstable/Unpredictable   ? Rehab Potential Fair   ? PT Frequency 2x / week   ? PT Duration 4 weeks   ? PT Treatment/Interventions Gait training;Moist Heat;Functional mobility training;Therapeutic activities;Therapeutic exercise;Balance training;Neuromuscular re-education;Manual techniques;Dry needling;Taping;Electrical Stimulation;Cryotherapy;Traction   ? PT Next Visit Plan May consider trying mechanical cervical traction beginning at 13#.   ? Consulted and Agree with Plan of Care Patient   ? ?  ?  ? ?  ? ? ?Patient will benefit from skilled therapeutic intervention in order to improve the following deficits and impairments:  Abnormal gait, Difficulty walking, Decreased activity tolerance, Pain, Decreased balance, Hypomobility, Decreased strength, Decreased mobility, Postural dysfunction ? ?Visit Diagnosis: ?Radiculopathy, cervical region ? ?Radiculopathy, lumbar region ? ?Muscle weakness (generalized) ? ? ? ? ?Problem List ?Patient Active Problem List  ? Diagnosis Date Noted  ? Tremor of right hand 05/02/2020  ? 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 Hemiparesis of right dominant side ([place]) 05/02/2020  ? Chest pain 06/22/2014  ? Essential hypertension 06/22/2014  ? Belching 06/22/2014  ? Local skin infection 03/16/2013  ? Arteriosclerotic cardiovascular disease (ASCVD) 06/26/2012  ? Hyperlipidemia    ? ? ?Stanislav Gervase, [place], PT ?02/21/2022, 5:44 PM ? ? ?[facility] ?[place] ?[place], [place], [address] ?Phone: [phone]   Fax:  [phone] ? ?Name: RUTLEDGE SELSOR ?MRN: [id number] ?Date of Birth: 1966/06/02 ? ? ? ?

## 2022-02-26 ENCOUNTER — Ambulatory Visit: Payer: No Typology Code available for payment source | Admitting: Urology

## 2022-02-26 ENCOUNTER — Ambulatory Visit: Payer: No Typology Code available for payment source | Admitting: Physical Therapy

## 2022-02-26 DIAGNOSIS — M5412 Radiculopathy, cervical region: Secondary | ICD-10-CM

## 2022-02-26 NOTE — Therapy (Signed)
[place] ?[facility] ?[place] ?[place], [place], [address] ?Phone: [phone]   Fax:  [phone] ? ?Physical Therapy Treatment ? ?Patient Details  ?Name: Edward Fisher ?MRN: [id number] ?Date of Birth: Jan 15, 1966 ?Referring Provider (PT): Dubicki, PA-C ? ? ?Encounter Date: 02/26/2022 ? ? PT End of Session - 02/26/22 1733   ? ? Visit Number 6   ? Number of Visits 8   ? Date for PT Re-Evaluation 04/19/22   ? PT Start Time [phone]   ? PT Stop Time 0522   ? PT Time Calculation (min) 37 min   ? Activity Tolerance Patient tolerated treatment well   ? Behavior During Therapy [facility] for tasks assessed/performed   ? ?  ?  ? ?  ? ? ?Past Medical History:  ?Diagnosis Date  ? Arteriosclerotic cardiovascular disease (ASCVD)   ? Onset in 04/2012; CABG in 06/2012 w/ LIMA-LAD, L Rad-CFX, SVG-PL-PDA  ? Hyperlipidemia   ? goal LDL < 70  ? Insomnia   ? Nephrolithiasis   ? ? ?Past Surgical History:  ?Procedure Laterality Date  ? CORONARY ARTERY BYPASS GRAFT  06/24/2012  ? Procedure: CORONARY ARTERY BYPASS GRAFTING (CABG);  Surgeon: [name], MD;  Location: [place];  Service: Open Heart Surgery;  Laterality: N/A;  Coronary Artery  Bypass X 4 utilizing endoscopic saphenous vein graft and  Left radial Artery Harvest   ? CYSTOSCOPY W/ RETROGRADES Left 08/23/2015  ? Procedure: CYSTOSCOPY WITH RETROGRADE PYELOGRAM;  Surgeon: [name], MD;  [facility];  Service: Urology;  Laterality: Left;  ? CYSTOSCOPY WITH URETEROSCOPY, STONE BASKETRY AND STENT PLACEMENT Left 08/23/2015  ? Procedure: CYSTOSCOPY WITH URETEROSCOPY, STONE BASKET EXTRACTION;  Surgeon: [name], MD;  [facility]: [facility];  Service: Urology;  Laterality: Left;  ? HOLMIUM LASER APPLICATION Left 82/42/3536  ? Procedure: HOLMIUM LASER APPLICATION;  Surgeon: [name], MD;  [facility];  Service: Urology;  Laterality: Left;  ? LEFT HEART CATHETERIZATION WITH CORONARY ANGIOGRAM N/A 06/23/2012  ? Procedure: LEFT  HEART CATHETERIZATION WITH CORONARY ANGIOGRAM;  Surgeon: [name], MD;  Location: [facility] CATH LAB;  Service: Cardiovascular;  Laterality: N/A;  ? RHINOPLASTY    ? TONSILLECTOMY  ~1971  ? ? ?There were no vitals filed for this visit. ? ? Subjective Assessment - 02/26/22 1733   ? ? Subjective Patient needing to leave earlier today.  He did well with manual traction last visit and wanting to try mechanical traction today.   ? Pertinent History history of CABG x3, OA   ? Limitations Standing;Walking;House hold activities   ? How long can you walk comfortably? unsure   ? Patient Stated Goals play musical instruments, improved ease with work activiites   ? Currently in Pain? Yes   ? Pain Score 3    ? Pain Onset More than a month ago   ? ?  ?  ? ?  ? ? ? ? ? ? ? ? ? ? ? ? ? ? ? ? ? ? ? ? [place] Adult PT Treatment/Exercise - 02/26/22 0001   ? ?  ? Exercises  ? Exercises Knee/Hip   ?  ? Lumbar Exercises: Aerobic  ? Nustep Level 3 x 13 minutes.   ?  ? Modalities  ? Modalities Traction   ?  ? Traction  ? Type of Traction Cervical   ? Min (lbs) 5   ? Max (lbs) 13   ? Hold Time 99   ? Rest Time  5   ? Time 15   ? ?  ?  ? ?  ? ? ? ? ? ? ? ? ? ? ? ? ? ? ? PT Long Term Goals - 02/05/22 1626   ? ?  ? PT LONG TERM GOAL #1  ? Title Patient will be independent with his HEP.   ? Time 4   ? Period Weeks   ? Status New   ? Target Date 03/05/22   ?  ? PT LONG TERM GOAL #2  ? Title Patient will improve his five time sit to stand time to 12 seconds or less for improved safety.   ? Time 4   ? Period Weeks   ? Status New   ? Target Date 03/05/22   ?  ? PT LONG TERM GOAL #3  ? Title Patient will be able to demonstrate improved right foot clearance for improved safety with ambulation.   ? Time 4   ? Period Weeks   ? Status New   ? Target Date 03/05/22   ? ?  ?  ? ?  ? ? ? ? ? ? ? ? Plan - 02/26/22 1735   ? ? Clinical Impression Statement Patient tolerated traction without complaint and felt soem better following.   ? Personal Factors and  Comorbidities Comorbidity 1;Other;Time since onset of injury/illness/exacerbation   ? Comorbidities history of CABG x3, HTN   ? Examination-Activity Limitations Locomotion Level;Transfers;Bend;Carry;Squat;Stand;Lift   ? Examination-Participation Restrictions Meal Prep;Cleaning;Occupation;Community Activity;[employer] Work   ? Stability/Clinical Decision Making Unstable/Unpredictable   ? Rehab Potential Fair   ? PT Frequency 2x / week   ? PT Duration 4 weeks   ? PT Treatment/Interventions Gait training;Moist Heat;Functional mobility training;Therapeutic activities;Therapeutic exercise;Balance training;Neuromuscular re-education;Manual techniques;Dry needling;Taping;Electrical Stimulation;Cryotherapy;Traction   ? PT Next Visit Plan Intermittment cervical traction at 15#.   ? Consulted and Agree with Plan of Care Patient   ? ?  ?  ? ?  ? ? ?Patient will benefit from skilled therapeutic intervention in order to improve the following deficits and impairments:  Abnormal gait, Difficulty walking, Decreased activity tolerance, Pain, Decreased balance, Hypomobility, Decreased strength, Decreased mobility, Postural dysfunction ? ?Visit Diagnosis: ?Radiculopathy, cervical region ? ? ? ? ?Problem List ?Patient Active Problem List  ? Diagnosis Date Noted  ? Tremor of right hand 05/02/2020  ? 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 Hemiparesis of right dominant side ([facility]) 05/02/2020  ? Chest pain 06/22/2014  ? Essential hypertension 06/22/2014  ? Belching 06/22/2014  ? Local skin infection 03/16/2013  ? Arteriosclerotic cardiovascular disease (ASCVD) 06/26/2012  ? Hyperlipidemia   ? ? ?Tangi Shroff, [place], PT ?02/26/2022, 5:37 PM ? ?[place] ?[facility] ?[place] ?[place], [place], [address] ?Phone: [phone]   Fax:  [phone] ? ?Name: Edward Fisher ?MRN: [id number] ?Date of Birth: Jan 09, 1966 ? ? ? ?

## 2022-02-28 ENCOUNTER — Ambulatory Visit: Payer: No Typology Code available for payment source | Admitting: *Deleted

## 2022-02-28 DIAGNOSIS — M5412 Radiculopathy, cervical region: Secondary | ICD-10-CM

## 2022-02-28 NOTE — Therapy (Signed)
[place] ?[facility] ?[place] ?[place], [place], [address] ?Phone: [phone]   Fax:  [phone] ? ?Physical Therapy Treatment ? ?Patient Details  ?Name: Edward Fisher ?MRN: [id number] ?Date of Birth: Jul 08, 1966 ?Referring Provider (PT): Dubicki, PA-C ? ? ?Encounter Date: 02/28/2022 ? ? PT End of Session - 02/28/22 1644   ? ? Visit Number 7   ? Number of Visits 8   ? Date for PT Re-Evaluation 04/19/22   ? PT Start Time 1937   ? PT Stop Time 9024   ? PT Time Calculation (min) 52 min   ? ?  ?  ? ?  ? ? ?Past Medical History:  ?Diagnosis Date  ? Arteriosclerotic cardiovascular disease (ASCVD)   ? Onset in 04/2012; CABG in 06/2012 w/ LIMA-LAD, L Rad-CFX, SVG-PL-PDA  ? Hyperlipidemia   ? goal LDL < 70  ? Insomnia   ? Nephrolithiasis   ? ? ?Past Surgical History:  ?Procedure Laterality Date  ? CORONARY ARTERY BYPASS GRAFT  06/24/2012  ? Procedure: CORONARY ARTERY BYPASS GRAFTING (CABG);  Surgeon: [name], MD;  Location: [place];  Service: Open Heart Surgery;  Laterality: N/A;  Coronary Artery  Bypass X 4 utilizing endoscopic saphenous vein graft and  Left radial Artery Harvest   ? CYSTOSCOPY W/ RETROGRADES Left 08/23/2015  ? Procedure: CYSTOSCOPY WITH RETROGRADE PYELOGRAM;  Surgeon: [name], MD;  [facility];  Service: Urology;  Laterality: Left;  ? CYSTOSCOPY WITH URETEROSCOPY, STONE BASKETRY AND STENT PLACEMENT Left 08/23/2015  ? Procedure: CYSTOSCOPY WITH URETEROSCOPY, STONE BASKET EXTRACTION;  Surgeon: [name], MD;  [facility];  Service: Urology;  Laterality: Left;  ? HOLMIUM LASER APPLICATION Left 09/73/5329  ? Procedure: HOLMIUM LASER APPLICATION;  Surgeon: [name], MD;  [facility];  Service: Urology;  Laterality: Left;  ? LEFT HEART CATHETERIZATION WITH CORONARY ANGIOGRAM N/A 06/23/2012  ? Procedure: LEFT HEART CATHETERIZATION WITH CORONARY ANGIOGRAM;  Surgeon: [name], MD;  Location: [facility] CATH LAB;  Service:  Cardiovascular;  Laterality: N/A;  ? RHINOPLASTY    ? TONSILLECTOMY  ~1971  ? ? ?There were no vitals filed for this visit. ? ? ? ? ? ? ? ? ? ? ? ? ? ? ? ? ? ? ? ? ? [place] Adult PT Treatment/Exercise - 02/28/22 0001   ? ?  ? Posture/Postural Control  ? Posture Comments Discussed postues in sitting and standing to decrease spinal tension/stress   ?  ? Exercises  ? Exercises Knee/Hip;Shoulder   ?  ? Lumbar Exercises: Aerobic  ? Nustep Level 3 x 15 minutes.   ?  ? Shoulder Exercises: Standing  ? Horizontal ABduction Strengthening;Both;10 reps;20 reps;Theraband   3x10  ? Theraband Level (Shoulder Horizontal ABduction) Level 3 (Green)   ? Row Strengthening;Both;10 reps;20 reps;Theraband   3x10  ? Theraband Level (Shoulder Row) Level 3 (Green)   ? Other Standing Exercises Green band and handout given for HEP   ?  ? Modalities  ? Modalities Traction   ?  ? Traction  ? Type of Traction Cervical   ? Min (lbs) 5   ? Max (lbs) 15   ? Hold Time 99   ? Rest Time 5   ? Time 15   ? ?  ?  ? ?  ? ? ? ? ? ? ? ? ? ? ? ? ? ? ? PT Long Term Goals - 02/05/22 1626   ? ?  ? PT LONG  TERM GOAL #1  ? Title Patient will be independent with his HEP.   ? Time 4   ? Period Weeks   ? Status New   ? Target Date 03/05/22   ?  ? PT LONG TERM GOAL #2  ? Title Patient will improve his five time sit to stand time to 12 seconds or less for improved safety.   ? Time 4   ? Period Weeks   ? Status New   ? Target Date 03/05/22   ?  ? PT LONG TERM GOAL #3  ? Title Patient will be able to demonstrate improved right foot clearance for improved safety with ambulation.   ? Time 4   ? Period Weeks   ? Status New   ? Target Date 03/05/22   ? ?  ?  ? ?  ? ? ? ? ? ? ? ? Plan - 02/28/22 1651   ? ? Clinical Impression Statement Pt arrived today reporting doing well after last Rx and traction at 13#s. He was able to perform some posural Exs today with green tubing without increased pain.. Handout and Green band issued for HEP. Cervical traction performed at 15#s today  and tolerated well. Green tband given. Give handout next Rx.   ? Personal Factors and Comorbidities Comorbidity 1;Other;Time since onset of injury/illness/exacerbation   ? Comorbidities history of CABG x3, HTN   ? Examination-Activity Limitations Locomotion Level;Transfers;Bend;Carry;Squat;Stand;Lift   ? Examination-Participation Restrictions Meal Prep;Cleaning;Occupation;Community Activity;[employer] Work   ? Stability/Clinical Decision Making Unstable/Unpredictable   ? Rehab Potential Fair   ? PT Frequency 2x / week   ? PT Duration 4 weeks   ? PT Next Visit Plan Intermittment cervical traction at 15#.   ? Consulted and Agree with Plan of Care Patient   ? ?  ?  ? ?  ? ? ?Patient will benefit from skilled therapeutic intervention in order to improve the following deficits and impairments:  Abnormal gait, Difficulty walking, Decreased activity tolerance, Pain, Decreased balance, Hypomobility, Decreased strength, Decreased mobility, Postural dysfunction ? ?Visit Diagnosis: ?Radiculopathy, cervical region ? ? ? ? ?Problem List ?Patient Active Problem List  ? Diagnosis Date Noted  ? Tremor of right hand 05/02/2020  ? 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 Hemiparesis of right dominant side ([place]) 05/02/2020  ? Chest pain 06/22/2014  ? Essential hypertension 06/22/2014  ? Belching 06/22/2014  ? Local skin infection 03/16/2013  ? Arteriosclerotic cardiovascular disease (ASCVD) 06/26/2012  ? Hyperlipidemia   ? ? ?Chelsea Pedretti,CHRIS, PTA ?02/28/2022, 5:57 PM ? ?[place] ?[facility] ?[address] ?[place], [place], [address] ?Phone: [phone]   Fax:  [phone] ? ?Name: REECE FEHNEL ?MRN: [id number] ?Date of Birth: 1966-06-04 ? ? ? ?

## 2022-03-05 ENCOUNTER — Ambulatory Visit: Payer: No Typology Code available for payment source | Attending: Physician Assistant | Admitting: Physical Therapy

## 2022-03-05 DIAGNOSIS — M6281 Muscle weakness (generalized): Secondary | ICD-10-CM | POA: Insufficient documentation

## 2022-03-05 DIAGNOSIS — R293 Abnormal posture: Secondary | ICD-10-CM | POA: Insufficient documentation

## 2022-03-05 DIAGNOSIS — M5416 Radiculopathy, lumbar region: Secondary | ICD-10-CM | POA: Insufficient documentation

## 2022-03-05 DIAGNOSIS — M5412 Radiculopathy, cervical region: Secondary | ICD-10-CM | POA: Insufficient documentation

## 2022-03-05 NOTE — Therapy (Signed)
[place] ?[facility] ?[place] ?[place], [place], [address] ?Phone: [phone]   Fax:  [phone] ? ?Physical Therapy Treatment ? ?Patient Details  ?Name: Edward Fisher ?MRN: [id number] ?Date of Birth: Oct 10, 1966 ?Referring Provider (PT): Dubicki, PA-C ? ? ?Encounter Date: 03/05/2022 ? ? PT End of Session - 03/05/22 1658   ? ? Visit Number 8   ? Number of Visits 12   ? Date for PT Re-Evaluation 04/19/22   ? PT Start Time [phone]   ? PT Stop Time 0534   ? PT Time Calculation (min) 49 min   ? Activity Tolerance Patient tolerated treatment well   ? Behavior During Therapy [facility] for tasks assessed/performed   ? ?  ?  ? ?  ? ? ?Past Medical History:  ?Diagnosis Date  ? Arteriosclerotic cardiovascular disease (ASCVD)   ? Onset in 04/2012; CABG in 06/2012 w/ LIMA-LAD, L Rad-CFX, SVG-PL-PDA  ? Hyperlipidemia   ? goal LDL < 70  ? Insomnia   ? Nephrolithiasis   ? ? ?Past Surgical History:  ?Procedure Laterality Date  ? CORONARY ARTERY BYPASS GRAFT  06/24/2012  ? Procedure: CORONARY ARTERY BYPASS GRAFTING (CABG);  Surgeon: [name], MD;  Location: [place];  Service: Open Heart Surgery;  Laterality: N/A;  Coronary Artery  Bypass X 4 utilizing endoscopic saphenous vein graft and  Left radial Artery Harvest   ? CYSTOSCOPY W/ RETROGRADES Left 08/23/2015  ? Procedure: CYSTOSCOPY WITH RETROGRADE PYELOGRAM;  Surgeon: [name], MD;  [facility];  Service: Urology;  Laterality: Left;  ? CYSTOSCOPY WITH URETEROSCOPY, STONE BASKETRY AND STENT PLACEMENT Left 08/23/2015  ? Procedure: CYSTOSCOPY WITH URETEROSCOPY, STONE BASKET EXTRACTION;  Surgeon: [name], MD;  [facility];  Service: Urology;  Laterality: Left;  ? HOLMIUM LASER APPLICATION Left 41/96/2229  ? Procedure: HOLMIUM LASER APPLICATION;  Surgeon: [name], MD;  [facility];  Service: Urology;  Laterality: Left;  ? LEFT HEART CATHETERIZATION WITH CORONARY ANGIOGRAM N/A 06/23/2012  ? Procedure: LEFT  HEART CATHETERIZATION WITH CORONARY ANGIOGRAM;  Surgeon: [name], MD;  Location: [facility] CATH LAB;  Service: Cardiovascular;  Laterality: N/A;  ? RHINOPLASTY    ? TONSILLECTOMY  ~1971  ? ? ?There were no vitals filed for this visit. ? ? Subjective Assessment - 03/05/22 1657   ? ? Subjective Patient enjoying cervical traction.   ? Pertinent History history of CABG x3, OA   ? Limitations Standing;Walking;House hold activities   ? How long can you walk comfortably? unsure   ? Patient Stated Goals play musical instruments, improved ease with work activiites   ? Currently in Pain? Yes   ? Pain Score 3    ? Pain Location Neck   ? ?  ?  ? ?  ? ? ? ? ? ? ? ? ? ? ? ? ? ? ? ? ? ? ? ? [facility] Adult PT Treatment/Exercise - 03/05/22 0001   ? ?  ? Exercises  ? Exercises Knee/Hip   ?  ? Lumbar Exercises: Aerobic  ? Nustep level 3 x 15 minutes.   ?  ? Traction  ? Type of Traction Cervical   ? Min (lbs) 5   ? Max (lbs) 17   ? Hold Time 99   ? Rest Time 5   ? Time 15   ?  ? Manual Therapy  ? Manual Therapy Soft tissue mobilization   ? Soft tissue mobilization STW/M x 8 minutes to patient's  right UT x 8 minutes including ischemic release technique   ? ?  ?  ? ?  ? ? ? ? ? ? ? ? ? ? ? ? ? ? ? PT Long Term Goals - 02/05/22 1626   ? ?  ? PT LONG TERM GOAL #1  ? Title Patient will be independent with his HEP.   ? Time 4   ? Period Weeks   ? Status New   ? Target Date 03/05/22   ?  ? PT LONG TERM GOAL #2  ? Title Patient will improve his five time sit to stand time to 12 seconds or less for improved safety.   ? Time 4   ? Period Weeks   ? Status New   ? Target Date 03/05/22   ?  ? PT LONG TERM GOAL #3  ? Title Patient will be able to demonstrate improved right foot clearance for improved safety with ambulation.   ? Time 4   ? Period Weeks   ? Status New   ? Target Date 03/05/22   ? ?  ?  ? ?  ? ? ? ? ? ? ? ? Plan - 03/05/22 1736   ? ? Clinical Impression Statement Increased tratction to 17# today with patient reporting a good response.   ?  Personal Factors and Comorbidities Comorbidity 1;Other;Time since onset of injury/illness/exacerbation   ? Comorbidities history of CABG x3, HTN   ? Examination-Activity Limitations Locomotion Level;Transfers;Bend;Carry;Squat;Stand;Lift   ? Examination-Participation Restrictions Meal Prep;Cleaning;Occupation;Community Activity;[employer] Work   ? Stability/Clinical Decision Making Unstable/Unpredictable   ? Rehab Potential Fair   ? PT Frequency 2x / week   ? PT Duration 4 weeks   ? PT Next Visit Plan Intermittment cervical traction at 20#.   ? Consulted and Agree with Plan of Care Patient   ? ?  ?  ? ?  ? ? ?Patient will benefit from skilled therapeutic intervention in order to improve the following deficits and impairments:    ? ?Visit Diagnosis: ?Radiculopathy, cervical region ? ? ? ? ?Problem List ?Patient Active Problem List  ? Diagnosis Date Noted  ? Tremor of right hand 05/02/2020  ? 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 Hemiparesis of right dominant side ([place]) 05/02/2020  ? Chest pain 06/22/2014  ? Essential hypertension 06/22/2014  ? Belching 06/22/2014  ? Local skin infection 03/16/2013  ? Arteriosclerotic cardiovascular disease (ASCVD) 06/26/2012  ? Hyperlipidemia   ? ? ?Tyrea Froberg, [place], PT ?03/05/2022, 5:38 PM ? ? ?[facility] ?[place] ?[place], [place], [address] ?Phone: [phone]   Fax:  [phone] ? ?Name: Edward Fisher ?MRN: [id number] ?Date of Birth: Feb 19, 1966 ? ? ? ?

## 2022-03-07 ENCOUNTER — Ambulatory Visit: Payer: No Typology Code available for payment source | Admitting: Physical Therapy

## 2022-03-12 ENCOUNTER — Ambulatory Visit: Payer: No Typology Code available for payment source | Admitting: *Deleted

## 2022-03-12 DIAGNOSIS — M5412 Radiculopathy, cervical region: Secondary | ICD-10-CM

## 2022-03-12 NOTE — Therapy (Signed)
[place] ?[facility] ?[address] ?[place], [place], [address] ?Phone: [phone]   Fax:  [phone] ? ?Physical Therapy Treatment ? ?Patient Details  ?Name: Edward Fisher ?MRN: [id number] ?Date of Birth: 27-Jan-1966 ?Referring Provider (PT): Dubicki, PA-C ? ? ?Encounter Date: 03/12/2022 ? ? PT End of Session - 03/12/22 1633   ? ? Visit Number 9   ? Number of Visits 12   ? Date for PT Re-Evaluation 04/19/22   ? PT Start Time 3220   ? PT Stop Time 2542   ? PT Time Calculation (min) 49 min   ? ?  ?  ? ?  ? ? ?Past Medical History:  ?Diagnosis Date  ? Arteriosclerotic cardiovascular disease (ASCVD)   ? Onset in 04/2012; CABG in 06/2012 w/ LIMA-LAD, L Rad-CFX, SVG-PL-PDA  ? Hyperlipidemia   ? goal LDL < 70  ? Insomnia   ? Nephrolithiasis   ? ? ?Past Surgical History:  ?Procedure Laterality Date  ? CORONARY ARTERY BYPASS GRAFT  06/24/2012  ? Procedure: CORONARY ARTERY BYPASS GRAFTING (CABG);  Surgeon: [name], MD;  Location: [place];  Service: Open Heart Surgery;  Laterality: N/A;  Coronary Artery  Bypass X 4 utilizing endoscopic saphenous vein graft and  Left radial Artery Harvest   ? CYSTOSCOPY W/ RETROGRADES Left 08/23/2015  ? Procedure: CYSTOSCOPY WITH RETROGRADE PYELOGRAM;  Surgeon: [name], MD;  [facility];  Service: Urology;  Laterality: Left;  ? CYSTOSCOPY WITH URETEROSCOPY, STONE BASKETRY AND STENT PLACEMENT Left 08/23/2015  ? Procedure: CYSTOSCOPY WITH URETEROSCOPY, STONE BASKET EXTRACTION;  Surgeon: [name], MD;  [facility];  Service: Urology;  Laterality: Left;  ? HOLMIUM LASER APPLICATION Left 70/62/3762  ? Procedure: HOLMIUM LASER APPLICATION;  Surgeon: [name], MD;  [facility];  Service: Urology;  Laterality: Left;  ? LEFT HEART CATHETERIZATION WITH CORONARY ANGIOGRAM N/A 06/23/2012  ? Procedure: LEFT HEART CATHETERIZATION WITH CORONARY ANGIOGRAM;  Surgeon: [name], MD;  Location: [facility] CATH LAB;  Service:  Cardiovascular;  Laterality: N/A;  ? RHINOPLASTY    ? TONSILLECTOMY  ~1971  ? ? ?There were no vitals filed for this visit. ? ? Subjective Assessment - 03/12/22 1559   ? ? Subjective Did ok after last visit. 2/10 today   ? Pertinent History history of CABG x3, OA   ? Limitations Standing;Walking;House hold activities   ? How long can you walk comfortably? unsure   ? Patient Stated Goals play musical instruments, improved ease with work activiites   ? Currently in Pain? Yes   ? Pain Score 2    ? Pain Location Neck   ? Pain Orientation Mid   ? Pain Descriptors / Indicators Aching;Numbness   ? Pain Type Chronic pain   ? Pain Onset More than a month ago   ? ?  ?  ? ?  ? ? ? ? ? ? ? ? ? ? ? ? ? ? ? ? ? ? ? ? [place] Adult PT Treatment/Exercise - 03/12/22 0001   ? ?  ? Exercises  ? Exercises Knee/Hip   ?  ? Lumbar Exercises: Aerobic  ? Nustep level 3 x 15 minutes.   ?  ? Shoulder Exercises: Stretch  ? Wall Stretch - Flexion 5 reps;10 seconds   both arms for thoracic extension facing wall and with back and towel roll against wall  ?  ? Modalities  ? Modalities Traction   ?  ? Traction  ? Type  of Traction Cervical   ? Min (lbs) 5   ? Max (lbs) 17   ? Hold Time 99   ? Rest Time 5   ? Time 15   ?  ? Manual Therapy  ? Manual Therapy Soft tissue mobilization   ? Soft tissue mobilization STW/ TPR x 8 minutes to patient's Bil.  UT's   ? ?  ?  ? ?  ? ? ? ? ? ? ? ? ? ? ? ? ? ? ? PT Long Term Goals - 02/05/22 1626   ? ?  ? PT LONG TERM GOAL #1  ? Title Patient will be independent with his HEP.   ? Time 4   ? Period Weeks   ? Status New   ? Target Date 03/05/22   ?  ? PT LONG TERM GOAL #2  ? Title Patient will improve his five time sit to stand time to 12 seconds or less for improved safety.   ? Time 4   ? Period Weeks   ? Status New   ? Target Date 03/05/22   ?  ? PT LONG TERM GOAL #3  ? Title Patient will be able to demonstrate improved right foot clearance for improved safety with ambulation.   ? Time 4   ? Period Weeks   ? Status  New   ? Target Date 03/05/22   ? ?  ?  ? ?  ? ? ? ? ? ? ? ? Plan - 03/12/22 1638   ? ? Clinical Impression Statement Pt arrived today doing better with 2/10 pain. He reports having pain in mid-thoracic area and was instructed in stretches against the wall to help with thoracic extension. He did well with Cervical traction at 20#s today   ? Personal Factors and Comorbidities Comorbidity 1;Other;Time since onset of injury/illness/exacerbation   ? Comorbidities history of CABG x3, HTN   ? Examination-Activity Limitations Locomotion Level;Transfers;Bend;Carry;Squat;Stand;Lift   ? Examination-Participation Restrictions Meal Prep;Cleaning;Occupation;Community Activity;[employer] Work   ? Stability/Clinical Decision Making Unstable/Unpredictable   ? Rehab Potential Fair   ? PT Duration 4 weeks   ? PT Treatment/Interventions Gait training;Moist Heat;Functional mobility training;Therapeutic activities;Therapeutic exercise;Balance training;Neuromuscular re-education;Manual techniques;Dry needling;Taping;Electrical Stimulation;Cryotherapy;Traction   ? PT Next Visit Plan Intermittment cervical traction at 20#.   ? Consulted and Agree with Plan of Care Patient   ? ?  ?  ? ?  ? ? ?Patient will benefit from skilled therapeutic intervention in order to improve the following deficits and impairments:  Abnormal gait, Difficulty walking, Decreased activity tolerance, Pain, Decreased balance, Hypomobility, Decreased strength, Decreased mobility, Postural dysfunction ? ?Visit Diagnosis: ?Radiculopathy, cervical region ? ? ? ? ?Problem List ?Patient Active Problem List  ? Diagnosis Date Noted  ? Tremor of right hand 05/02/2020  ? 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 Hemiparesis of right dominant side ([place]) 05/02/2020  ? Chest pain 06/22/2014  ? Essential hypertension 06/22/2014  ? Belching 06/22/2014  ? Local skin infection 03/16/2013  ? Arteriosclerotic cardiovascular disease (ASCVD) 06/26/2012  ? Hyperlipidemia    ? ? ?Glendon Dunwoody,CHRIS, PTA ?03/12/2022, 4:51 PM ? ?[place] ?[facility] ?[place] [place] ?[place], [place], [address] ?Phone: [phone]   Fax:  [phone] ? ?Name: ALBY SCHWABE ?MRN: [id number] ?Date of Birth: Jun 01, 1966 ? ? ? ?

## 2022-03-13 ENCOUNTER — Telehealth: Payer: Self-pay | Admitting: Physician Assistant

## 2022-03-13 NOTE — Telephone Encounter (Signed)
Pt was called and reminded to update enrollment prior to June appointment.  He was given phone number for [facility].  ?

## 2022-03-14 ENCOUNTER — Ambulatory Visit: Payer: No Typology Code available for payment source | Admitting: Physical Therapy

## 2022-03-14 DIAGNOSIS — M5412 Radiculopathy, cervical region: Secondary | ICD-10-CM

## 2022-03-14 NOTE — Therapy (Signed)
[place] ?[facility] ?[address] ?[place], [place], [address] ?Phone: [phone]   Fax:  [phone] ? ?Physical Therapy Treatment ? ?Patient Details  ?Name: Edward Fisher ?MRN: [id number] ?Date of Birth: 10-05-1966 ?Referring Provider (PT): Dubicki, PA-C ? ? ?Encounter Date: 03/14/2022 ? ? PT End of Session - 03/14/22 1719   ? ? Visit Number 10   ? Number of Visits 12   ? Date for PT Re-Evaluation 04/19/22   ? PT Start Time [id number]   ? PT Stop Time 0534   ? PT Time Calculation (min) 49 min   ? Activity Tolerance Patient tolerated treatment well   ? Behavior During Therapy [facility] for tasks assessed/performed   ? ?  ?  ? ?  ? ? ?Past Medical History:  ?Diagnosis Date  ? Arteriosclerotic cardiovascular disease (ASCVD)   ? Onset in 04/2012; CABG in 06/2012 w/ LIMA-LAD, L Rad-CFX, SVG-PL-PDA  ? Hyperlipidemia   ? goal LDL < 70  ? Insomnia   ? Nephrolithiasis   ? ? ?Past Surgical History:  ?Procedure Laterality Date  ? CORONARY ARTERY BYPASS GRAFT  06/24/2012  ? Procedure: CORONARY ARTERY BYPASS GRAFTING (CABG);  Surgeon: [name], MD;  Location: [place];  Service: Open Heart Surgery;  Laterality: N/A;  Coronary Artery  Bypass X 4 utilizing endoscopic saphenous vein graft and  Left radial Artery Harvest   ? CYSTOSCOPY W/ RETROGRADES Left 08/23/2015  ? Procedure: CYSTOSCOPY WITH RETROGRADE PYELOGRAM;  Surgeon: [name], MD;  [facility]: [facility];  Service: Urology;  Laterality: Left;  ? CYSTOSCOPY WITH URETEROSCOPY, STONE BASKETRY AND STENT PLACEMENT Left 08/23/2015  ? Procedure: CYSTOSCOPY WITH URETEROSCOPY, STONE BASKET EXTRACTION;  Surgeon: [name], MD;  Location: [facility];  Service: Urology;  Laterality: Left;  ? HOLMIUM LASER APPLICATION Left 57/32/2025  ? Procedure: HOLMIUM LASER APPLICATION;  Surgeon: [name], MD;  [facility];  Service: Urology;  Laterality: Left;  ? LEFT HEART CATHETERIZATION WITH CORONARY ANGIOGRAM N/A 06/23/2012  ? Procedure: LEFT  HEART CATHETERIZATION WITH CORONARY ANGIOGRAM;  Surgeon: [name], MD;  Location: [facility] CATH LAB;  Service: Cardiovascular;  Laterality: N/A;  ? RHINOPLASTY    ? TONSILLECTOMY  ~1971  ? ? ?There were no vitals filed for this visit. ? ? Subjective Assessment - 03/14/22 1752   ? ? Subjective Felt good after last treatment.  Pain about a 2 right now but got to about a 7 today at work.  He continues to experience right UE tremors.   ? Pertinent History history of CABG x3, OA   ? Limitations Standing;Walking;House hold activities   ? How long can you walk comfortably? unsure   ? Patient Stated Goals play musical instruments, improved ease with work activiites   ? Currently in Pain? Yes   ? Pain Score 2    ? Pain Location Neck   ? ?  ?  ? ?  ? ? ? ? ? ? ? ? ? ? ? ? ? ? ? ? ? ? ? ? [facility] Adult PT Treatment/Exercise - 03/14/22 0001   ? ?  ? Exercises  ? Exercises Knee/Hip   ?  ? Lumbar Exercises: Aerobic  ? Nustep Level 3 x 15 minutes.   ?  ? Traction  ? Type of Traction Cervical   ? Min (lbs) 5   ? Max (lbs) 19   ? Hold Time 99   ? Rest Time 5   ? Time 15   ?  ?  Manual Therapy  ? Manual Therapy Soft tissue mobilization   ? Soft tissue mobilization STW/M x 11 minutes to patient's right UT including TPR technique.   ? ?  ?  ? ?  ? ? ? ? ? ? ? ? ? ? ? ? ? ? ? PT Long Term Goals - 02/05/22 1626   ? ?  ? PT LONG TERM GOAL #1  ? Title Patient will be independent with his HEP.   ? Time 4   ? Period Weeks   ? Status New   ? Target Date 03/05/22   ?  ? PT LONG TERM GOAL #2  ? Title Patient will improve his five time sit to stand time to 12 seconds or less for improved safety.   ? Time 4   ? Period Weeks   ? Status New   ? Target Date 03/05/22   ?  ? PT LONG TERM GOAL #3  ? Title Patient will be able to demonstrate improved right foot clearance for improved safety with ambulation.   ? Time 4   ? Period Weeks   ? Status New   ? Target Date 03/05/22   ? ?  ?  ? ?  ? ? ? ? ? ? ? ? Plan - 03/14/22 1757   ? ? Clinical Impression  Statement The patient did well with therapy today and tolerated without complaint.  Following traction his right shoulder abduction and flexion was graded at a 4/5.  He has been doing the theraband exercises at home for right UE strethening.  He is motivated to improve as he plays musical instruments and wants to continue to do so.   ? Personal Factors and Comorbidities Comorbidity 1;Other;Time since onset of injury/illness/exacerbation   ? Comorbidities history of CABG x3, HTN   ? Examination-Activity Limitations Locomotion Level;Transfers;Bend;Carry;Squat;Stand;Lift   ? Examination-Participation Restrictions Meal Prep;Cleaning;Occupation;Community Activity;[employer] Work   ? Stability/Clinical Decision Making Unstable/Unpredictable   ? Rehab Potential Fair   ? PT Frequency 2x / week   ? PT Duration 4 weeks   ? PT Treatment/Interventions Gait training;Moist Heat;Functional mobility training;Therapeutic activities;Therapeutic exercise;Balance training;Neuromuscular re-education;Manual techniques;Dry needling;Taping;Electrical Stimulation;Cryotherapy;Traction   ? PT Next Visit Plan Intermittment cervical traction at 20#.   ? Consulted and Agree with Plan of Care Patient   ? ?  ?  ? ?  ? ? ?Patient will benefit from skilled therapeutic intervention in order to improve the following deficits and impairments:  Abnormal gait, Difficulty walking, Decreased activity tolerance, Pain, Decreased balance, Hypomobility, Decreased strength, Decreased mobility, Postural dysfunction ? ?Visit Diagnosis: ?Radiculopathy, cervical region ? ? ? ? ?Problem List ?Patient Active Problem List  ? Diagnosis Date Noted  ? Tremor of right hand 05/02/2020  ? 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 Hemiparesis of right dominant side ([place]) 05/02/2020  ? Chest pain 06/22/2014  ? Essential hypertension 06/22/2014  ? Belching 06/22/2014  ? Local skin infection 03/16/2013  ? Arteriosclerotic cardiovascular disease (ASCVD) 06/26/2012  ?  Hyperlipidemia   ? ? ?Jakaila Norment, [place], PT ?03/14/2022, 6:02 PM ? ? ?[facility] ?[place] ?[place], [place], [address] ?Phone: [phone]   Fax:  [phone] ? ?Name: SEYDINA HOLLIMAN ?MRN: [id number] ?Date of Birth: 06-08-1966 ? ? ? ?

## 2022-03-14 NOTE — Addendum Note (Signed)
Addended by: Ebenezer Mccaskey, [place] W on: 03/14/2022 02:22 PM ? ? Modules accepted: Orders ? ?

## 2022-03-19 ENCOUNTER — Ambulatory Visit: Payer: No Typology Code available for payment source | Admitting: Physical Therapy

## 2022-03-19 ENCOUNTER — Encounter: Payer: Self-pay | Admitting: Physical Therapy

## 2022-03-19 DIAGNOSIS — M5412 Radiculopathy, cervical region: Secondary | ICD-10-CM

## 2022-03-19 NOTE — Therapy (Signed)
[place] ?[facility] ?[address] ?[place], [place], [address] ?Phone: [phone]   Fax:  [phone] ? ?Physical Therapy Treatment ? ?Patient Details  ?Name: Edward Fisher ?MRN: [id number] ?Date of Birth: Mar 07, 1966 ?Referring Provider (PT): Dubicki, PA-C ? ? ?Encounter Date: 03/19/2022 ? ? PT End of Session - 03/19/22 1707   ? ? Visit Number 11   ? Number of Visits 12   ? Date for PT Re-Evaluation 04/19/22   ? PT Start Time [phone]   ? PT Stop Time 0539   ? PT Time Calculation (min) 54 min   ? Activity Tolerance Patient tolerated treatment well   ? Behavior During Therapy [facility] for tasks assessed/performed   ? ?  ?  ? ?  ? ? ?Past Medical History:  ?Diagnosis Date  ? Arteriosclerotic cardiovascular disease (ASCVD)   ? Onset in 04/2012; CABG in 06/2012 w/ LIMA-LAD, L Rad-CFX, SVG-PL-PDA  ? Hyperlipidemia   ? goal LDL < 70  ? Insomnia   ? Nephrolithiasis   ? ? ?Past Surgical History:  ?Procedure Laterality Date  ? CORONARY ARTERY BYPASS GRAFT  06/24/2012  ? Procedure: CORONARY ARTERY BYPASS GRAFTING (CABG);  Surgeon: [name], MD;  Location: [place];  Service: Open Heart Surgery;  Laterality: N/A;  Coronary Artery  Bypass X 4 utilizing endoscopic saphenous vein graft and  Left radial Artery Harvest   ? CYSTOSCOPY W/ RETROGRADES Left 08/23/2015  ? Procedure: CYSTOSCOPY WITH RETROGRADE PYELOGRAM;  Surgeon: [name], MD;  [facility];  Service: Urology;  Laterality: Left;  ? CYSTOSCOPY WITH URETEROSCOPY, STONE BASKETRY AND STENT PLACEMENT Left 08/23/2015  ? Procedure: CYSTOSCOPY WITH URETEROSCOPY, STONE BASKET EXTRACTION;  Surgeon: [name], MD;  [facility];  Service: Urology;  Laterality: Left;  ? HOLMIUM LASER APPLICATION Left 95/28/4132  ? Procedure: HOLMIUM LASER APPLICATION;  Surgeon: [name], MD;  Location[facility];  Service: Urology;  Laterality: Left;  ? LEFT HEART CATHETERIZATION WITH CORONARY ANGIOGRAM N/A 06/23/2012  ? Procedure: LEFT  HEART CATHETERIZATION WITH CORONARY ANGIOGRAM;  Surgeon: [name], MD;  Location: [facility] CATH LAB;  Service: Cardiovascular;  Laterality: N/A;  ? RHINOPLASTY    ? TONSILLECTOMY  ~1971  ? ? ?There were no vitals filed for this visit. ? ? Subjective Assessment - 03/19/22 1708   ? ? Subjective Pain about a 4.   ? Pertinent History history of CABG x3, OA   ? Limitations Standing;Walking;House hold activities   ? How long can you walk comfortably? unsure   ? Patient Stated Goals play musical instruments, improved ease with work activiites   ? Currently in Pain? Yes   ? Pain Score 4    ? Pain Location Neck   ? Pain Orientation Mid   ? Pain Descriptors / Indicators Aching;Numbness   ? Pain Type Chronic pain   ? Pain Onset More than a month ago   ? ?  ?  ? ?  ? ? ? ? ? ? ? ? ? ? ? ? ? ? ? ? ? ? ? ? [place] Adult PT Treatment/Exercise - 03/19/22 0001   ? ?  ? Exercises  ? Exercises Shoulder   ?  ? Lumbar Exercises: Aerobic  ? UBE (Upper Arm Bike) 8 minutes at 90 RPM's.   ?  ? Modalities  ? Modalities Traction;Ultrasound   ?  ? Ultrasound  ? Ultrasound Location Right cervical.   ? Ultrasound Parameters Small soundhead:  Combo e'stim/US at  1.50 W/CM2 x 8 minutes to patient's right mid to lower cervical region.   ?  ? Traction  ? Type of Traction Cervical   ? Min (lbs) 5   ? Max (lbs) 21   ? Hold Time 99   ? Rest Time 5   ? Time 15   ?  ? Manual Therapy  ? Manual Therapy Soft tissue mobilization   ? Soft tissue mobilization STW/M x 7 minutes to patient's right cervical musculature with ischemic release technique to his right UT.   ? ?  ?  ? ?  ? ? ? ? ? ? ? ? ? ? ? ? ? ? ? PT Long Term Goals - 02/05/22 1626   ? ?  ? PT LONG TERM GOAL #1  ? Title Patient will be independent with his HEP.   ? Time 4   ? Period Weeks   ? Status New   ? Target Date 03/05/22   ?  ? PT LONG TERM GOAL #2  ? Title Patient will improve his five time sit to stand time to 12 seconds or less for improved safety.   ? Time 4   ? Period Weeks   ? Status New    ? Target Date 03/05/22   ?  ? PT LONG TERM GOAL #3  ? Title Patient will be able to demonstrate improved right foot clearance for improved safety with ambulation.   ? Time 4   ? Period Weeks   ? Status New   ? Target Date 03/05/22   ? ?  ?  ? ?  ? ? ? ? ? ? ? ? ? ?Patient will benefit from skilled therapeutic intervention in order to improve the following deficits and impairments:    ? ?Visit Diagnosis: ?Radiculopathy, cervical region ? ? ? ? ?Problem List ?Patient Active Problem List  ? Diagnosis Date Noted  ? Tremor of right hand 05/02/2020  ? 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 Hemiparesis of right dominant side ([place]) 05/02/2020  ? Chest pain 06/22/2014  ? Essential hypertension 06/22/2014  ? Belching 06/22/2014  ? Local skin infection 03/16/2013  ? Arteriosclerotic cardiovascular disease (ASCVD) 06/26/2012  ? Hyperlipidemia   ? ? ?Elisha Mcgruder, [place], PT ?03/19/2022, 5:46 PM ? ?[place] [facility] ?[place] ?[place], [place], [address] ?Phone: [phone]   Fax:  [phone] ? ?Name: ASHKAN CHAMBERLAND ?MRN: [id number] ?Date of Birth: 12/23/1965 ? ? ? ?

## 2022-03-21 ENCOUNTER — Ambulatory Visit: Payer: No Typology Code available for payment source | Admitting: *Deleted

## 2022-03-21 DIAGNOSIS — M6281 Muscle weakness (generalized): Secondary | ICD-10-CM

## 2022-03-21 DIAGNOSIS — M5412 Radiculopathy, cervical region: Secondary | ICD-10-CM

## 2022-03-21 DIAGNOSIS — M5416 Radiculopathy, lumbar region: Secondary | ICD-10-CM

## 2022-03-21 DIAGNOSIS — R293 Abnormal posture: Secondary | ICD-10-CM

## 2022-03-21 NOTE — Therapy (Signed)
[facility] [place], [place], [address] Phone: [phone]   Fax:  [phone]  Physical Therapy Treatment  Patient Details  Name: Edward Fisher MRN: [id number] Date of Birth: 20-May-1966 Referring Provider (PT): [place], [place]   Encounter Date: 03/21/2022   PT End of Session - 03/21/22 1653     Visit Number 12    Number of Visits 12    Date for PT Re-Evaluation 04/19/22    PT Start Time 38    PT Stop Time 3299    PT Time Calculation (min) 45 min             Past Medical History:  Diagnosis Date   Arteriosclerotic cardiovascular disease (ASCVD)    Onset in 04/2012; CABG in 06/2012 w/ LIMA-LAD, L Rad-CFX, SVG-PL-PDA   Hyperlipidemia    goal LDL < 70   Insomnia    Nephrolithiasis     Past Surgical History:  Procedure Laterality Date   CORONARY ARTERY BYPASS GRAFT  06/24/2012   Procedure: CORONARY ARTERY BYPASS GRAFTING (CABG);  Surgeon: [name], MD;  Location: [place];  Service: Open Heart Surgery;  Laterality: N/A;  Coronary Artery  Bypass X 4 utilizing endoscopic saphenous vein graft and  Left radial Artery Harvest    CYSTOSCOPY W/ RETROGRADES Left 08/23/2015   Procedure: CYSTOSCOPY WITH RETROGRADE PYELOGRAM;  Surgeon: [name], MD;  [facility];  Service: Urology;  Laterality: Left;   CYSTOSCOPY WITH URETEROSCOPY, STONE BASKETRY AND STENT PLACEMENT Left 08/23/2015   Procedure: CYSTOSCOPY WITH URETEROSCOPY, STONE BASKET EXTRACTION;  Surgeon: [name], MD;  Location: [facility];  Service: Urology;  Laterality: Left;   HOLMIUM LASER APPLICATION Left 24/26/8341   Procedure: HOLMIUM LASER APPLICATION;  Surgeon: [name], MD;  Location[facility];  Service: Urology;  Laterality: Left;   LEFT HEART CATHETERIZATION WITH CORONARY ANGIOGRAM N/A 06/23/2012   Procedure: LEFT HEART CATHETERIZATION WITH CORONARY ANGIOGRAM;  Surgeon: [name], MD;  Location: [facility] CATH LAB;  Service:  Cardiovascular;  Laterality: N/A;   RHINOPLASTY     TONSILLECTOMY  ~1971    There were no vitals filed for this visit.   Subjective Assessment - 03/21/22 1724     Subjective Need to leave early today. Do [place] and traction    Pertinent History history of CABG x3, OA    Limitations Standing;Walking;House hold activities    How long can you walk comfortably? unsure    Patient Stated Goals play musical instruments, improved ease with work activiites    Currently in Pain? Yes    Pain Score 4     Pain Location Neck    Pain Orientation Mid    Pain Descriptors / Indicators Aching;Numbness    Pain Type Chronic pain    Pain Onset More than a month ago                               [facility] Adult PT Treatment/Exercise - 03/21/22 0001       Self-Care   Self-Care Other Self-Care Comments   Discussed and reviewed HEP     Exercises   Exercises Shoulder      Modalities   Modalities Traction;Ultrasound      Ultrasound   Ultrasound Location RT UT and cervical paras    Ultrasound Parameters combo 1.5 w/cm2 x 10 mins    Ultrasound Goals Pain      Traction  Type of Traction Cervical    Min (lbs) 5    Max (lbs) 21    Hold Time 99    Rest Time 5    Time 15                          PT Long Term Goals - 03/21/22 1654       PT LONG TERM GOAL #1   Title Patient will be independent with his HEP.    Time 4    Period Weeks    Status Achieved      PT LONG TERM GOAL #2   Title Patient will improve his five time sit to stand time to 12 seconds or less for improved safety.    Time 4    Period Weeks    Status Partially Met   improved time ,but unable to meet 12 secs     PT LONG TERM GOAL #3   Title Patient will be able to demonstrate improved right foot clearance for improved safety with ambulation.    Status Partially Met   intermittent toe drag   Target Date 03/05/22                   Plan - 03/21/22 1725     Clinical Impression  Statement Pt arrived today doing fairly well, but reports having a hard day at work and mid-back is hurting.Pt requested [place] and traction today due to needing to leave early. He did well with Rx. Traction performed at 21#s again. Pt will be DC to HEP today. He was able to meet LTG #1 and Partially meet others. DC to HEP    Personal Factors and Comorbidities Comorbidity 1;Other;Time since onset of injury/illness/exacerbation    Comorbidities history of CABG x3, HTN    Examination-Activity Limitations Locomotion Level;Transfers;Bend;Carry;Squat;Stand;Lift    Examination-Participation Restrictions Meal Prep;Cleaning;Occupation;Community Activity;[employer] Work    Stability/Clinical Decision Making Unstable/Unpredictable    Rehab Potential Fair    PT Frequency 2x / week    PT Duration 4 weeks    PT Treatment/Interventions Gait training;Moist Heat;Functional mobility training;Therapeutic activities;Therapeutic exercise;Balance training;Neuromuscular re-education;Manual techniques;Dry needling;Taping;Electrical Stimulation;Cryotherapy;Traction    PT Next Visit Plan DC to HEP    Consulted and Agree with Plan of Care Patient             Patient will benefit from skilled therapeutic intervention in order to improve the following deficits and impairments:  Abnormal gait, Difficulty walking, Decreased activity tolerance, Pain, Decreased balance, Hypomobility, Decreased strength, Decreased mobility, Postural dysfunction  Visit Diagnosis: Radiculopathy, cervical region  Radiculopathy, lumbar region  Muscle weakness (generalized)  Abnormal posture     Problem List Patient Active Problem List   Diagnosis Date Noted   Tremor of right hand 05/02/2020   Uncontrolled REM sleep behavior disorder 05/02/2020   Hemiparesis of right dominant side ([place]) 05/02/2020   Chest pain 06/22/2014   Essential hypertension 06/22/2014   Belching 06/22/2014   Local skin infection 03/16/2013   Arteriosclerotic  cardiovascular disease (ASCVD) 06/26/2012   Hyperlipidemia     [name],CHRIS, PTA 03/21/2022, 5:44 PM  [facility] [address] [place], [place], [address] Phone: [phone]   Fax:  [phone]  Name: Edward Fisher MRN: [id number] Date of Birth: 07/29/1966  PHYSICAL THERAPY DISCHARGE SUMMARY  Visits from Start of Care: 12  Current functional level related to goals / functional outcomes: Patient was able to partially meet his goals for physical  therapy and felt comfortable being discharged at this time.    Remaining deficits: Pain, weakness, power, and gait deviations   Education / Equipment: HEP    Patient agrees to discharge. Patient goals were partially met. Patient is being discharged due to maximized rehab potential.   Jacqulynn Cadet, PT, DPT

## 2022-03-26 DIAGNOSIS — R531 Weakness: Secondary | ICD-10-CM | POA: Insufficient documentation

## 2022-03-26 NOTE — Progress Notes (Unsigned)
Cardiology Office Note   Date:  03/27/2022   ID:  Edward Fisher, Edward Fisher Sep 20, 1966, MRN [id number]  PCP:  Soyla Dryer, PA-C  Cardiologist:   Minus Breeding, MD   Chief Complaint  Patient presents with   Coronary Artery Disease      History of Present Illness: Edward Fisher is a 56 y.o. male who presents for follow up of CAD (CABG 2013 LIMA-LAD, left radial-circumflex, SVG-PL-PDA).  Since he was last seen he has done okay although he still has right-sided weakness and tremor that has not been diagnosed.  He seen neurology.  He has moved into her new place.  He was staying with his son a lot since his wife died of COVID.  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  He had some fleeting chest discomfort as described previously.  This comes and goes.  He is not sure whether it is similar to anything he had before his bypass.  He might get occasional shortness of breath with exertion but he is not describing resting shortness of breath, PND or orthopnea.  He had no palpitations, presyncope or syncope.    Past Medical History:  Diagnosis Date   Arteriosclerotic cardiovascular disease (ASCVD)    Onset in 04/2012; CABG in 06/2012 w/ LIMA-LAD, L Rad-CFX, SVG-PL-PDA   Hyperlipidemia    goal LDL < 70   Insomnia    Nephrolithiasis     Past Surgical History:  Procedure Laterality Date   CORONARY ARTERY BYPASS GRAFT  06/24/2012   Procedure: CORONARY ARTERY BYPASS GRAFTING (CABG);  Surgeon: [name], MD;  Location: [place];  Service: Open Heart Surgery;  Laterality: N/A;  Coronary Artery  Bypass X 4 utilizing endoscopic saphenous vein graft and  Left radial Artery Harvest    CYSTOSCOPY W/ RETROGRADES Left 08/23/2015   Procedure: CYSTOSCOPY WITH RETROGRADE PYELOGRAM;  Surgeon: [name], MD;  Location: [facility];  Service: Urology;  Laterality: Left;   CYSTOSCOPY WITH URETEROSCOPY, STONE BASKETRY AND STENT PLACEMENT Left 08/23/2015   Procedure: CYSTOSCOPY WITH  URETEROSCOPY, STONE BASKET EXTRACTION;  Surgeon: [name], MD;  Location: [facility];  Service: Urology;  Laterality: Left;   HOLMIUM LASER APPLICATION Left 93/26/7124   Procedure: HOLMIUM LASER APPLICATION;  Surgeon: [name], MD;  Location: [facility];  Service: Urology;  Laterality: Left;   LEFT HEART CATHETERIZATION WITH CORONARY ANGIOGRAM N/A 06/23/2012   Procedure: LEFT HEART CATHETERIZATION WITH CORONARY ANGIOGRAM;  Surgeon: [name], MD;  Location: [facility] C[facility];  Service: Cardiovascular;  Laterality: N/A;   RHINOPLASTY     TONSILLECTOMY  ~1971     Current Outpatient Medications  Medication Sig Dispense Refill   aspirin 81 MG chewable tablet Chew 81 mg by mouth every evening. TO START DOSAGE IN 6 MONTHS APPROXIMATELY July 2014     atorvastatin (LIPITOR) 40 MG tablet Take 1 tablet (40 mg total) by mouth daily. 30 tablet 4   metoprolol succinate (TOPROL-XL) 25 MG 24 hr tablet Take 1 tablet (25 mg total) by mouth daily. 30 tablet 6   omega-3 acid ethyl esters (LOVAZA) 1 G capsule Take 1 g by mouth every evening.     nitroGLYCERIN (NITROSTAT) 0.4 MG SL tablet Place 1 tablet (0.4 mg total) under the tongue every 5 (five) minutes as needed for chest pain. (Patient not taking: Reported on 08/28/2021) 25 tablet PRN   tamsulosin (FLOMAX) 0.4 MG CAPS capsule Take 1 capsule by mouth once daily (Patient not taking:  Reported on 03/27/2022) 30 capsule 0   No current facility-administered medications for this visit.    Allergies:   Prozac [fluoxetine] and Pollen extract   ROS:  Please see the history of present illness.   Otherwise, review of systems are positive for none.   All other systems are reviewed and negative.    PHYSICAL EXAM: VS:  BP 136/82   Pulse 72   Ht '5\' 10"'$  (1.778 m)   Wt 200 lb 12.8 oz (91.1 kg)   SpO2 97%   BMI 28.81 kg/m  , BMI Body mass index is 28.81 kg/m. GENERAL:  Well appearing NECK:  No jugular venous distention, waveform within normal limits,  carotid upstroke brisk and symmetric, no bruits, no thyromegaly LUNGS:  Clear to auscultation bilaterally CHEST:  Well healed sternotomy scar. HEART:  PMI not displaced or sustained,S1 and S2 within normal limits, no S3, no S4, no clicks, no rubs, no murmurs ABD:  Flat, positive bowel sounds normal in frequency in pitch, no bruits, no rebound, no guarding, no midline pulsatile mass, no hepatomegaly, no splenomegaly EXT:  2 plus pulses throughout, no edema, no cyanosis no clubbing   EKG:  EKG is  ordered today. The ekg ordered 05/25/2020 demonstrates sinus rhythm, rate 72, axis within normal limits, nonspecific T wave changes, baseline artifact   Recent Labs: 06/24/2021: Hemoglobin 15.5; Platelets 161 12/06/2021: ALT 24; BUN 17; Creatinine, Ser 0.92; Potassium 4.2; Sodium 139    Lipid Panel    Component Value Date/Time   CHOL 114 12/06/2021 1021   TRIG 72 12/06/2021 1021   HDL 33 (L) 12/06/2021 1021   CHOLHDL 3.5 12/06/2021 1021   VLDL 14 12/06/2021 1021   LDLCALC 67 12/06/2021 1021      Wt Readings from Last 3 Encounters:  03/27/22 200 lb 12.8 oz (91.1 kg)  12/10/21 202 lb (91.6 kg)  08/17/21 202 lb (91.6 kg)      Other studies Reviewed: Additional studies/ records that were reviewed today include: Labs. Review of the above records demonstrates:  Please see elsewhere in the note.     ASSESSMENT AND PLAN:  CAD in native artery:   He does have some shortness of breath and some vague chest discomfort.  I will screen him with a POET (Plain Old Exercise Treadmill)  Dyslipidemia:    67 with an HDL of 33.  We are going to hold his statin as below.  If it turns out he is not statin intolerant he will need PCSK9.   Weakness:     I do not think his weakness is related to his statin but we have no other etiology.  He will hold it for couple of months and if he has no improvement in his symptoms he will resume.  Current medicines are reviewed at length with the patient today.  The  patient does not have concerns regarding medicines.  The following changes have been made:   None  Labs/ tests ordered today include:    Orders Placed This Encounter  Procedures   EXERCISE TOLERANCE TEST (ETT)   EKG 12-Lead     Disposition:   FU with me in one year.    Signed, Minus Breeding, MD  03/27/2022 1:58 PM    [facility]

## 2022-03-27 ENCOUNTER — Ambulatory Visit (INDEPENDENT_AMBULATORY_CARE_PROVIDER_SITE_OTHER): Payer: Self-pay | Admitting: Cardiology

## 2022-03-27 ENCOUNTER — Encounter: Payer: Self-pay | Admitting: Cardiology

## 2022-03-27 VITALS — BP 136/82 | HR 72 | Ht 70.0 in | Wt 200.8 lb

## 2022-03-27 DIAGNOSIS — I2510 Atherosclerotic heart disease of native coronary artery without angina pectoris: Secondary | ICD-10-CM

## 2022-03-27 DIAGNOSIS — R072 Precordial pain: Secondary | ICD-10-CM

## 2022-03-27 DIAGNOSIS — E785 Hyperlipidemia, unspecified: Secondary | ICD-10-CM

## 2022-03-27 DIAGNOSIS — R531 Weakness: Secondary | ICD-10-CM

## 2022-03-27 NOTE — Patient Instructions (Signed)
Medication Instructions:  Please do not take your statin medication for 2 months to see if your symptoms resolve. Continue all other medications as listed.  *If you need a refill on your cardiac medications before your next appointment, please call your pharmacy*  Testing/Procedures: Your physician has requested that you have an exercise tolerance test. For further information please visit [email or web address]. Please also follow instruction sheet, as given.  Follow-Up: At [facility], you and your health needs are our priority.  As part of our continuing mission to provide you with exceptional heart care, we have created designated Provider Care Teams.  These Care Teams include your primary Cardiologist (physician) and Advanced Practice Providers (APPs -  Physician Assistants and Nurse Practitioners) who all work together to provide you with the care you need, when you need it.  We recommend signing up for the patient portal called "MyChart".  Sign up information is provided on this After Visit Summary.  MyChart is used to connect with patients for Virtual Visits (Telemedicine).  Patients are able to view lab/test results, encounter notes, upcoming appointments, etc.  Non-urgent messages can be sent to your provider as well.   To learn more about what you can do with MyChart, go to [email or web address].    Your next appointment:   Follow up with Dr Percival Spanish will be determined after your treadmill test.  Important Information About Sugar

## 2022-04-08 ENCOUNTER — Ambulatory Visit (HOSPITAL_COMMUNITY)
Admission: RE | Admit: 2022-04-08 | Discharge: 2022-04-08 | Disposition: A | Discharge status: Home / Self Care | Payer: Self-pay | Source: Ambulatory Visit | Attending: Cardiology | Admitting: Cardiology

## 2022-04-08 DIAGNOSIS — I2510 Atherosclerotic heart disease of native coronary artery without angina pectoris: Secondary | ICD-10-CM

## 2022-04-08 LAB — EXERCISE TOLERANCE TEST
Angina Index: 0
Duke Treadmill Score: 4 — AB
Estimated workload: 6.5
Exercise duration (min): 3 min
Exercise duration (sec): 50 s
MPHR: 162 {beats}/min
Peak HR: 127 {beats}/min
Percent HR: 78 %
RPE: 15
Rest HR: 65 {beats}/min
ST Depression (mm): 0 mm

## 2022-04-09 ENCOUNTER — Encounter: Payer: Self-pay | Admitting: Physician Assistant

## 2022-04-09 ENCOUNTER — Ambulatory Visit: Payer: Self-pay | Admitting: Physician Assistant

## 2022-04-09 VITALS — BP 130/80 | HR 72 | Temp 97.0°F

## 2022-04-09 DIAGNOSIS — R531 Weakness: Secondary | ICD-10-CM

## 2022-04-09 DIAGNOSIS — R251 Tremor, unspecified: Secondary | ICD-10-CM

## 2022-04-09 DIAGNOSIS — Z1211 Encounter for screening for malignant neoplasm of colon: Secondary | ICD-10-CM

## 2022-04-09 DIAGNOSIS — N400 Enlarged prostate without lower urinary tract symptoms: Secondary | ICD-10-CM

## 2022-04-09 DIAGNOSIS — I2510 Atherosclerotic heart disease of native coronary artery without angina pectoris: Secondary | ICD-10-CM

## 2022-04-09 LAB — POC FIT TEST STOOL: Fecal Occult Blood: NEGATIVE

## 2022-04-09 MED ORDER — TAMSULOSIN HCL 0.4 MG PO CAPS: 0.4000 mg | ORAL_CAPSULE | Freq: Every day | ORAL | 5 refills | Status: DC

## 2022-04-09 NOTE — Progress Notes (Signed)
BP 130/80   Pulse 72   Temp (!) 97 F (36.1 C)   SpO2 97%    Subjective:    Patient ID: Edward Fisher, male    DOB: 11-01-1966, 56 y.o.   MRN: [id number]  HPI: Edward Fisher is a 56 y.o. male presenting on 04/09/2022 for No chief complaint on file.   HPI  Pt is 65yoM who presents for routine follow up.  Pt was seen by cardiology 03/27/22.   He had EST yesterday but they couldn't finish it due to his leg weakness and worried about falls  He hasn't seen neurology since 07/24/21 (for weakness, shaking).  He was sent to PT and referral to NS for injections. Pt says PT didn't help.    Note says to f/u 6 months.  He missed his appt 01/30/22.    Statin was stopped 03/27/22 by cardiologist due to concerns that it ws causing his weakness.  he was told to stop it for 2 month  Pt is still working Firefighter.    Relevant past medical, surgical, family and social history reviewed and updated as indicated. Interim medical history since our last visit reviewed. Allergies and medications reviewed and updated.   Current Outpatient Medications:    metoprolol succinate (TOPROL-XL) 25 MG 24 hr tablet, Take 1 tablet (25 mg total) by mouth daily., Disp: 30 tablet, Rfl: 6   omega-3 acid ethyl esters (LOVAZA) 1 G capsule, Take 1 g by mouth every evening., Disp: , Rfl:    aspirin 81 MG chewable tablet, Chew 81 mg by mouth every evening. TO START DOSAGE IN 6 MONTHS APPROXIMATELY July 2014 (Patient not taking: Reported on 04/09/2022), Disp: , Rfl:    atorvastatin (LIPITOR) 40 MG tablet, Take 1 tablet (40 mg total) by mouth daily. (Patient not taking: Reported on 04/09/2022), Disp: 30 tablet, Rfl: 4   nitroGLYCERIN (NITROSTAT) 0.4 MG SL tablet, Place 1 tablet (0.4 mg total) under the tongue every 5 (five) minutes as needed for chest pain. (Patient not taking: Reported on 08/28/2021), Disp: 25 tablet, Rfl: PRN   tamsulosin (FLOMAX) 0.4 MG CAPS capsule, Take 1 capsule by mouth once daily (Patient  not taking: Reported on 03/27/2022), Disp: 30 capsule, Rfl: 0    Review of Systems  Per HPI unless specifically indicated above     Objective:    BP 130/80   Pulse 72   Temp (!) 97 F (36.1 C)   SpO2 97%   Wt Readings from Last 3 Encounters:  03/27/22 200 lb 12.8 oz (91.1 kg)  12/10/21 202 lb (91.6 kg)  08/17/21 202 lb (91.6 kg)    Physical Exam Vitals reviewed.  Constitutional:      General: He is not in acute distress.    Appearance: He is well-developed. He is not ill-appearing.  HENT:     Head: Normocephalic and atraumatic.  Cardiovascular:     Rate and Rhythm: Normal rate and regular rhythm.  Pulmonary:     Effort: Pulmonary effort is normal.     Breath sounds: Normal breath sounds. No wheezing.  Abdominal:     General: Bowel sounds are normal.     Palpations: Abdomen is soft.     Tenderness: There is no abdominal tenderness.  Musculoskeletal:     Cervical back: Neck supple.     Right lower leg: No edema.     Left lower leg: No edema.  Lymphadenopathy:     Cervical: No cervical adenopathy.  Skin:  General: Skin is warm and dry.  Neurological:     Mental Status: He is alert and oriented to person, place, and time.     Motor: Weakness and tremor present.     Gait: Gait abnormal.     Deep Tendon Reflexes:     Reflex Scores:      Patellar reflexes are 1+ on the right side and 2+ on the left side.    Comments: Weakness and tremor RUE & RLE  Psychiatric:        Behavior: Behavior normal.         Assessment & Plan:   Encounter Diagnoses  Name Primary?   Arteriosclerotic cardiovascular disease (ASCVD) Yes   Benign prostatic hyperplasia, unspecified whether lower urinary tract symptoms present    Weakness    Tremor    Screening for colon cancer        -he Needs to f/u with neurology.  He is encouraged to call to schedule -No changes today -pt is educated and encouraged to get covid vaccination -Plan to recheck end July/early august to see if  there is any improvement off statin.  Pt to contact office sooner prn

## 2022-04-17 ENCOUNTER — Encounter: Payer: Self-pay | Admitting: *Deleted

## 2022-05-07 ENCOUNTER — Other Ambulatory Visit: Payer: Self-pay | Admitting: Physician Assistant

## 2022-06-06 ENCOUNTER — Other Ambulatory Visit: Payer: Self-pay | Admitting: Physician Assistant

## 2022-06-06 DIAGNOSIS — I10 Essential (primary) hypertension: Secondary | ICD-10-CM

## 2022-06-06 DIAGNOSIS — I2510 Atherosclerotic heart disease of native coronary artery without angina pectoris: Secondary | ICD-10-CM

## 2022-06-06 DIAGNOSIS — E785 Hyperlipidemia, unspecified: Secondary | ICD-10-CM

## 2022-06-14 ENCOUNTER — Other Ambulatory Visit (HOSPITAL_COMMUNITY)
Admission: RE | Admit: 2022-06-14 | Discharge: 2022-06-14 | Disposition: A | Discharge status: Home / Self Care | Payer: Self-pay | Source: Ambulatory Visit | Attending: Physician Assistant | Admitting: Physician Assistant

## 2022-06-14 DIAGNOSIS — I10 Essential (primary) hypertension: Secondary | ICD-10-CM | POA: Insufficient documentation

## 2022-06-14 DIAGNOSIS — E785 Hyperlipidemia, unspecified: Secondary | ICD-10-CM | POA: Insufficient documentation

## 2022-06-14 DIAGNOSIS — I2510 Atherosclerotic heart disease of native coronary artery without angina pectoris: Secondary | ICD-10-CM | POA: Insufficient documentation

## 2022-06-14 LAB — LIPID PANEL
Cholesterol: 182 mg/dL (ref 0–200)
HDL: 27 mg/dL — ABNORMAL LOW (ref >40)
LDL Cholesterol: 130 mg/dL — ABNORMAL HIGH (ref 0–99)
Total CHOL/HDL Ratio: 6.7 RATIO
Triglycerides: 123 mg/dL (ref <150)
VLDL: 25 mg/dL (ref 0–40)

## 2022-06-14 LAB — COMPREHENSIVE METABOLIC PANEL
ALT: 18 U/L (ref 0–44)
AST: 16 U/L (ref 15–41)
Albumin: 4.3 g/dL (ref 3.5–5.0)
Alkaline Phosphatase: 68 U/L (ref 38–126)
Anion gap: 5 (ref 5–15)
BUN: 19 mg/dL (ref 6–20)
CO2: 26 mmol/L (ref 22–32)
Calcium: 9.5 mg/dL (ref 8.9–10.3)
Chloride: 109 mmol/L (ref 98–111)
Creatinine, Ser: 1 mg/dL (ref 0.61–1.24)
GFR, Estimated: >60 mL/min (ref >60)
Glucose, Bld: 110 mg/dL — ABNORMAL HIGH (ref 70–99)
Potassium: 4 mmol/L (ref 3.5–5.1)
Sodium: 140 mmol/L (ref 135–145)
Total Bilirubin: 1 mg/dL (ref 0.3–1.2)
Total Protein: 7.7 g/dL (ref 6.5–8.1)

## 2022-06-18 ENCOUNTER — Encounter: Payer: Self-pay | Admitting: Physician Assistant

## 2022-06-18 ENCOUNTER — Ambulatory Visit: Payer: Self-pay | Admitting: Physician Assistant

## 2022-06-18 VITALS — BP 110/76 | HR 67 | Temp 96.5°F | Ht 70.0 in | Wt 197.0 lb

## 2022-06-18 DIAGNOSIS — E785 Hyperlipidemia, unspecified: Secondary | ICD-10-CM

## 2022-06-18 DIAGNOSIS — I10 Essential (primary) hypertension: Secondary | ICD-10-CM

## 2022-06-18 DIAGNOSIS — I2510 Atherosclerotic heart disease of native coronary artery without angina pectoris: Secondary | ICD-10-CM

## 2022-06-18 DIAGNOSIS — R251 Tremor, unspecified: Secondary | ICD-10-CM

## 2022-06-18 DIAGNOSIS — H6123 Impacted cerumen, bilateral: Secondary | ICD-10-CM

## 2022-06-18 DIAGNOSIS — R531 Weakness: Secondary | ICD-10-CM

## 2022-06-18 DIAGNOSIS — G8929 Other chronic pain: Secondary | ICD-10-CM

## 2022-06-18 NOTE — Progress Notes (Unsigned)
BP 110/76   Pulse 67   Temp (!) 96.5 F (35.8 C)   Ht '5\' 10"'$  (1.778 m)   Wt 197 lb (89.4 kg)   SpO2 98%   BMI 28.27 kg/m    Subjective:    Patient ID: Edward Fisher, male    DOB: Dec 18, 1965, 56 y.o.   MRN: [id number]  HPI: Edward Fisher is a 56 y.o. male presenting on 06/18/2022 for Hyperlipidemia, Extremity Weakness (R arm), and Ear Problem (L ear pt states at times he feels he can't hear when he is lying on that ear or when he presses on it.)   HPI   Chief Complaint  Patient presents with   Hyperlipidemia   Extremity Weakness    R arm   Ear Problem    L ear pt states at times he feels he can't hear when he is lying on that ear or when he presses on it.    pt continues with Ear problem.  He has had lavage in past and is thinking he needs it again  He returns to neurologist in October; he has appointment   Pt asks about a chiropractor due to his back hurts.   Middle back. He has had injections by pain dr- (Dr Lyla Son he thinks).   He most recently saw neurosurgery on   01/08/22.  Pt was sent for PT and he says it didn't help any.   Pt is still working Firefighter.   Pt continues to see his cardiologist annually.     Relevant past medical, surgical, family and social history reviewed and updated as indicated. Interim medical history since our last visit reviewed. Allergies and medications reviewed and updated.   Current Outpatient Medications:    aspirin 81 MG chewable tablet, Chew 81 mg by mouth every evening. TO START DOSAGE IN 6 MONTHS APPROXIMATELY July 2014, Disp: , Rfl:    metoprolol succinate (TOPROL-XL) 25 MG 24 hr tablet, Take 1 tablet by mouth once daily, Disp: 30 tablet, Rfl: 1   nitroGLYCERIN (NITROSTAT) 0.4 MG SL tablet, Place 1 tablet (0.4 mg total) under the tongue every 5 (five) minutes as needed for chest pain., Disp: 25 tablet, Rfl: PRN   omega-3 acid ethyl esters (LOVAZA) 1 G capsule, Take 1 g by mouth every evening., Disp: , Rfl:     tamsulosin (FLOMAX) 0.4 MG CAPS capsule, Take 1 capsule (0.4 mg total) by mouth daily., Disp: 30 capsule, Rfl: 5   atorvastatin (LIPITOR) 40 MG tablet, Take 1 tablet (40 mg total) by mouth daily. (Patient not taking: Reported on 06/18/2022), Disp: 30 tablet, Rfl: 4    Review of Systems  Per HPI unless specifically indicated above     Objective:    BP 110/76   Pulse 67   Temp (!) 96.5 F (35.8 C)   Ht '5\' 10"'$  (1.778 m)   Wt 197 lb (89.4 kg)   SpO2 98%   BMI 28.27 kg/m   Wt Readings from Last 3 Encounters:  06/18/22 197 lb (89.4 kg)  03/27/22 200 lb 12.8 oz (91.1 kg)  12/10/21 202 lb (91.6 kg)    Physical Exam Vitals reviewed.  Constitutional:      General: He is not in acute distress.    Appearance: He is well-developed. He is not ill-appearing.  HENT:     Head: Normocephalic and atraumatic.     Right Ear: Tympanic membrane and external ear normal. There is impacted cerumen.     Left  Ear: External ear normal. There is impacted cerumen.     Ears:     Comments: Cerumen right ear is no obscuring the TM but there is a large blob of cerumen in canal.   L TM obscured by impacted cerumen Cardiovascular:     Rate and Rhythm: Normal rate and regular rhythm.  Pulmonary:     Effort: Pulmonary effort is normal.     Breath sounds: Normal breath sounds. No wheezing.  Abdominal:     General: Bowel sounds are normal.     Palpations: Abdomen is soft.     Tenderness: There is no abdominal tenderness.  Musculoskeletal:     Cervical back: Neck supple.     Right lower leg: No edema.     Left lower leg: No edema.  Lymphadenopathy:     Cervical: No cervical adenopathy.  Skin:    General: Skin is warm and dry.  Neurological:     Mental Status: He is alert and oriented to person, place, and time.     Motor: Tremor present.  Psychiatric:        Behavior: Behavior normal.     Results for orders placed or performed during the hospital encounter of 06/14/22  Comprehensive metabolic  panel  Result Value Ref Range   Sodium 140 135 - 145 mmol/L   Potassium 4.0 3.5 - 5.1 mmol/L   Chloride 109 98 - 111 mmol/L   CO2 26 22 - 32 mmol/L   Glucose, Bld 110 (H) 70 - 99 mg/dL   BUN 19 6 - 20 mg/dL   Creatinine, Ser 1.00 0.61 - 1.24 mg/dL   Calcium 9.5 8.9 - 10.3 mg/dL   Total Protein 7.7 6.5 - 8.1 g/dL   Albumin 4.3 3.5 - 5.0 g/dL   AST 16 15 - 41 U/L   ALT 18 0 - 44 U/L   Alkaline Phosphatase 68 38 - 126 U/L   Total Bilirubin 1.0 0.3 - 1.2 mg/dL   GFR, Estimated >60 >60 mL/min   Anion gap 5 5 - 15  Lipid panel  Result Value Ref Range   Cholesterol 182 0 - 200 mg/dL   Triglycerides 123 <150 mg/dL   HDL 27 (L) >40 mg/dL   Total CHOL/HDL Ratio 6.7 RATIO   VLDL 25 0 - 40 mg/dL   LDL Cholesterol 130 (H) 0 - 99 mg/dL      Assessment & Plan:    Encounter Diagnoses  Name Primary?   Arteriosclerotic cardiovascular disease (ASCVD) Yes   Essential hypertension    Hyperlipidemia, unspecified hyperlipidemia type    Weakness    Tremor    Chronic bilateral thoracic back pain    Bilateral impacted cerumen     CAD -Reviewed labs with pt -Pt will restart statin -pt to continue other medications  Back pain -Pt encouraged to f/u with Neurosurgery -again encouraged pt to get a non-physical job  Weakness/tremor -pt to continue with neurology as scheduled  Cerumen -Pt had ear lavaged  -pt to follow up 3 months.  He is to contact office sooner prn

## 2022-06-18 NOTE — Patient Instructions (Addendum)
Neurosurgery  (for back pain) Norton Pastel, PA-C   [facility]   [place], [place] [address]   Phone: [phone]   Fax: [phone]

## 2022-07-17 ENCOUNTER — Other Ambulatory Visit: Payer: Self-pay | Admitting: Physician Assistant

## 2022-08-29 ENCOUNTER — Other Ambulatory Visit: Payer: Self-pay | Admitting: Physician Assistant

## 2022-08-29 DIAGNOSIS — I2510 Atherosclerotic heart disease of native coronary artery without angina pectoris: Secondary | ICD-10-CM

## 2022-08-29 DIAGNOSIS — E785 Hyperlipidemia, unspecified: Secondary | ICD-10-CM

## 2022-08-29 DIAGNOSIS — I10 Essential (primary) hypertension: Secondary | ICD-10-CM

## 2022-09-17 ENCOUNTER — Other Ambulatory Visit (HOSPITAL_COMMUNITY)
Admission: RE | Admit: 2022-09-17 | Discharge: 2022-09-17 | Disposition: A | Discharge status: Home / Self Care | Payer: Self-pay | Source: Ambulatory Visit | Attending: Physician Assistant | Admitting: Physician Assistant

## 2022-09-17 DIAGNOSIS — I2510 Atherosclerotic heart disease of native coronary artery without angina pectoris: Secondary | ICD-10-CM | POA: Insufficient documentation

## 2022-09-17 DIAGNOSIS — I10 Essential (primary) hypertension: Secondary | ICD-10-CM | POA: Insufficient documentation

## 2022-09-17 DIAGNOSIS — E785 Hyperlipidemia, unspecified: Secondary | ICD-10-CM | POA: Insufficient documentation

## 2022-09-17 LAB — COMPREHENSIVE METABOLIC PANEL
ALT: 7 U/L (ref 0–44)
AST: 18 U/L (ref 15–41)
Albumin: 4.2 g/dL (ref 3.5–5.0)
Alkaline Phosphatase: 63 U/L (ref 38–126)
Anion gap: 6 (ref 5–15)
BUN: 19 mg/dL (ref 6–20)
CO2: 26 mmol/L (ref 22–32)
Calcium: 9.2 mg/dL (ref 8.9–10.3)
Chloride: 109 mmol/L (ref 98–111)
Creatinine, Ser: 1.04 mg/dL (ref 0.61–1.24)
GFR, Estimated: >60 mL/min (ref >60)
Glucose, Bld: 121 mg/dL — ABNORMAL HIGH (ref 70–99)
Potassium: 4.5 mmol/L (ref 3.5–5.1)
Sodium: 141 mmol/L (ref 135–145)
Total Bilirubin: 0.6 mg/dL (ref 0.3–1.2)
Total Protein: 7.3 g/dL (ref 6.5–8.1)

## 2022-09-17 LAB — LIPID PANEL
Cholesterol: 110 mg/dL (ref 0–200)
HDL: 32 mg/dL — ABNORMAL LOW (ref >40)
LDL Cholesterol: 63 mg/dL (ref 0–99)
Total CHOL/HDL Ratio: 3.4 RATIO
Triglycerides: 77 mg/dL (ref <150)
VLDL: 15 mg/dL (ref 0–40)

## 2022-09-18 ENCOUNTER — Ambulatory Visit: Payer: Self-pay | Admitting: Physician Assistant

## 2022-09-18 ENCOUNTER — Encounter: Payer: Self-pay | Admitting: Physician Assistant

## 2022-09-18 VITALS — BP 108/62 | HR 76 | Temp 98.4°F | Ht 70.0 in | Wt 200.5 lb

## 2022-09-18 DIAGNOSIS — I10 Essential (primary) hypertension: Secondary | ICD-10-CM

## 2022-09-18 DIAGNOSIS — R258 Other abnormal involuntary movements: Secondary | ICD-10-CM

## 2022-09-18 DIAGNOSIS — E785 Hyperlipidemia, unspecified: Secondary | ICD-10-CM

## 2022-09-18 DIAGNOSIS — I2510 Atherosclerotic heart disease of native coronary artery without angina pectoris: Secondary | ICD-10-CM

## 2022-09-18 DIAGNOSIS — D492 Neoplasm of unspecified behavior of bone, soft tissue, and skin: Secondary | ICD-10-CM

## 2022-09-18 NOTE — Progress Notes (Signed)
BP 108/62   Pulse 76   Temp 98.4 F (36.9 C)   Ht '5\' 10"'$  (1.778 m)   Wt 200 lb 8 oz (90.9 kg)   SpO2 99%   BMI 28.77 kg/m    Subjective:    Patient ID: Edward Fisher, male    DOB: [birth year], 56 y.o.   MRN: [id number]  HPI: Edward Fisher is a 56 y.o. male presenting on 09/18/2022 for Coronary Artery Disease   HPI  Chief Complaint  Patient presents with   Coronary Artery Disease    Pt says he is doing well.  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    notes reviewed in Epic;  dx listed as bradykinesia  Pt says his mood is good, he is sleeping, no CP or sob.  He says his employer has been understanding of his medical issues.     Relevant past medical, surgical, family and social history reviewed and updated as indicated. Interim medical history since our last visit reviewed. Allergies and medications reviewed and updated.    Current Outpatient Medications:    aspirin 81 MG chewable tablet, Chew 81 mg by mouth every evening. TO START DOSAGE IN 6 MONTHS APPROXIMATELY July 2014, Disp: , Rfl:    atorvastatin (LIPITOR) 40 MG tablet, Take 1 tablet by mouth once daily, Disp: 30 tablet, Rfl: 2   carbidopa-levodopa (SINEMET IR) 25-100 MG tablet, Take 1 tablet by mouth 4 (four) times daily., Disp: , Rfl:    gabapentin (NEURONTIN) 300 MG capsule, Take 300 mg by mouth 4 (four) times daily., Disp: , Rfl:    metoprolol succinate (TOPROL-XL) 25 MG 24 hr tablet, Take 1 tablet by mouth once daily, Disp: 30 tablet, Rfl: 2   nitroGLYCERIN (NITROSTAT) 0.4 MG SL tablet, Place 1 tablet (0.4 mg total) under the tongue every 5 (five) minutes as needed for chest pain., Disp: 25 tablet, Rfl: PRN   omega-3 acid ethyl esters (LOVAZA) 1 G capsule, Take 1 g by mouth every evening., Disp: , Rfl:    tamsulosin (FLOMAX) 0.4 MG CAPS capsule, Take 1 capsule (0.4 mg total) by mouth daily., Disp: 30 capsule, Rfl: 5     Review of Systems  Per HPI unless  specifically indicated above     Objective:    BP 108/62   Pulse 76   Temp 98.4 F (36.9 C)   Ht '5\' 10"'$  (1.778 m)   Wt 200 lb 8 oz (90.9 kg)   SpO2 99%   BMI 28.77 kg/m   Wt Readings from Last 3 Encounters:  09/18/22 200 lb 8 oz (90.9 kg)  06/18/22 197 lb (89.4 kg)  03/27/22 200 lb 12.8 oz (91.1 kg)    Physical Exam Vitals reviewed.  Constitutional:      General: He is not in acute distress.    Appearance: He is well-developed. He is not toxic-appearing.  HENT:     Head: Normocephalic and atraumatic.  Cardiovascular:     Rate and Rhythm: Normal rate and regular rhythm.  Pulmonary:     Effort: Pulmonary effort is normal.     Breath sounds: Normal breath sounds. No wheezing.  Abdominal:     General: Bowel sounds are normal.     Palpations: Abdomen is soft.     Tenderness: There is no abdominal tenderness.  Musculoskeletal:     Cervical back: Neck supple.     Right lower leg: No edema.  Left lower leg: No edema.  Lymphadenopathy:     Cervical: No cervical adenopathy.  Skin:    General: Skin is warm and dry.     Comments: Abnormal skin lesion inferior right eye near base of nasal bridge.   Neurological:     Mental Status: He is alert and oriented to person, place, and time.     Gait: Gait abnormal.  Psychiatric:        Attention and Perception: Attention normal.        Behavior: Behavior normal. Behavior is cooperative.     Results for orders placed or performed during the hospital encounter of 09/17/22  Lipid panel  Result Value Ref Range   Cholesterol 110 0 - 200 mg/dL   Triglycerides 77 <150 mg/dL   HDL 32 (L) >40 mg/dL   Total CHOL/HDL Ratio 3.4 RATIO   VLDL 15 0 - 40 mg/dL   LDL Cholesterol 63 0 - 99 mg/dL  Comprehensive metabolic panel  Result Value Ref Range   Sodium 141 135 - 145 mmol/L   Potassium 4.5 3.5 - 5.1 mmol/L   Chloride 109 98 - 111 mmol/L   CO2 26 22 - 32 mmol/L   Glucose, Bld 121 (H) 70 - 99 mg/dL   BUN 19 6 - 20 mg/dL    Creatinine, Ser 1.04 0.61 - 1.24 mg/dL   Calcium 9.2 8.9 - 10.3 mg/dL   Total Protein 7.3 6.5 - 8.1 g/dL   Albumin 4.2 3.5 - 5.0 g/dL   AST 18 15 - 41 U/L   ALT 7 0 - 44 U/L   Alkaline Phosphatase 63 38 - 126 U/L   Total Bilirubin 0.6 0.3 - 1.2 mg/dL   GFR, Estimated >60 >60 mL/min   Anion gap 6 5 - 15      Assessment & Plan:    Encounter Diagnoses  Name Primary?   Arteriosclerotic cardiovascular disease (ASCVD) Yes   Essential hypertension    Hyperlipidemia, unspecified hyperlipidemia type    Bradykinesia    Abnormal skin growth       -reviewed labs with pt -pt to continue current meds -pt to continue with neurology per his recommendations -refer To dermatology for abnormal skin lesion under right eye -pt to follow up 3 months.  He is to contact office sooner prn

## 2022-10-23 ENCOUNTER — Other Ambulatory Visit: Payer: Self-pay | Admitting: Physician Assistant

## 2022-11-23 ENCOUNTER — Other Ambulatory Visit: Payer: Self-pay | Admitting: Physician Assistant

## 2022-12-04 ENCOUNTER — Other Ambulatory Visit: Payer: Self-pay | Admitting: Physician Assistant

## 2022-12-04 DIAGNOSIS — R7303 Prediabetes: Secondary | ICD-10-CM

## 2022-12-04 DIAGNOSIS — I2510 Atherosclerotic heart disease of native coronary artery without angina pectoris: Secondary | ICD-10-CM

## 2022-12-04 DIAGNOSIS — Z125 Encounter for screening for malignant neoplasm of prostate: Secondary | ICD-10-CM

## 2022-12-04 DIAGNOSIS — E785 Hyperlipidemia, unspecified: Secondary | ICD-10-CM

## 2022-12-04 DIAGNOSIS — I10 Essential (primary) hypertension: Secondary | ICD-10-CM

## 2022-12-16 ENCOUNTER — Other Ambulatory Visit (HOSPITAL_COMMUNITY)
Admission: RE | Admit: 2022-12-16 | Discharge: 2022-12-16 | Disposition: A | Discharge status: Home / Self Care | Payer: Self-pay | Source: Ambulatory Visit | Attending: Physician Assistant | Admitting: Physician Assistant

## 2022-12-16 DIAGNOSIS — R7303 Prediabetes: Secondary | ICD-10-CM | POA: Insufficient documentation

## 2022-12-16 DIAGNOSIS — E785 Hyperlipidemia, unspecified: Secondary | ICD-10-CM | POA: Insufficient documentation

## 2022-12-16 DIAGNOSIS — I2510 Atherosclerotic heart disease of native coronary artery without angina pectoris: Secondary | ICD-10-CM | POA: Insufficient documentation

## 2022-12-16 DIAGNOSIS — I10 Essential (primary) hypertension: Secondary | ICD-10-CM | POA: Insufficient documentation

## 2022-12-16 DIAGNOSIS — Z125 Encounter for screening for malignant neoplasm of prostate: Secondary | ICD-10-CM | POA: Insufficient documentation

## 2022-12-16 LAB — COMPREHENSIVE METABOLIC PANEL
ALT: 5 U/L (ref 0–44)
AST: 18 U/L (ref 15–41)
Albumin: 4.2 g/dL (ref 3.5–5.0)
Alkaline Phosphatase: 63 U/L (ref 38–126)
Anion gap: 8 (ref 5–15)
BUN: 17 mg/dL (ref 6–20)
CO2: 23 mmol/L (ref 22–32)
Calcium: 8.9 mg/dL (ref 8.9–10.3)
Chloride: 109 mmol/L (ref 98–111)
Creatinine, Ser: 0.97 mg/dL (ref 0.61–1.24)
GFR, Estimated: >60 mL/min (ref >60)
Glucose, Bld: 115 mg/dL — ABNORMAL HIGH (ref 70–99)
Potassium: 3.8 mmol/L (ref 3.5–5.1)
Sodium: 140 mmol/L (ref 135–145)
Total Bilirubin: 0.5 mg/dL (ref 0.3–1.2)
Total Protein: 7 g/dL (ref 6.5–8.1)

## 2022-12-16 LAB — LIPID PANEL
Cholesterol: 94 mg/dL (ref 0–200)
HDL: 29 mg/dL — ABNORMAL LOW (ref >40)
LDL Cholesterol: 55 mg/dL (ref 0–99)
Total CHOL/HDL Ratio: 3.2 RATIO
Triglycerides: 49 mg/dL (ref <150)
VLDL: 10 mg/dL (ref 0–40)

## 2022-12-16 LAB — PSA: Prostatic Specific Antigen: 1.22 ng/mL (ref 0.00–4.00)

## 2022-12-16 LAB — HEMOGLOBIN A1C
Hgb A1c MFr Bld: 5.5 % (ref 4.8–5.6)
Mean Plasma Glucose: 111 mg/dL

## 2022-12-18 ENCOUNTER — Ambulatory Visit: Payer: Self-pay | Admitting: Physician Assistant

## 2022-12-18 ENCOUNTER — Encounter: Payer: Self-pay | Admitting: Physician Assistant

## 2022-12-18 VITALS — BP 102/64 | HR 80 | Temp 97.9°F | Ht 70.0 in | Wt 202.0 lb

## 2022-12-18 DIAGNOSIS — R258 Other abnormal involuntary movements: Secondary | ICD-10-CM

## 2022-12-18 DIAGNOSIS — K5900 Constipation, unspecified: Secondary | ICD-10-CM

## 2022-12-18 DIAGNOSIS — I2510 Atherosclerotic heart disease of native coronary artery without angina pectoris: Secondary | ICD-10-CM

## 2022-12-18 MED ORDER — TAMSULOSIN HCL 0.4 MG PO CAPS: 0.4000 mg | ORAL_CAPSULE | Freq: Every day | ORAL | 3 refills | Status: DC

## 2022-12-18 NOTE — Patient Instructions (Signed)

## 2022-12-18 NOTE — Progress Notes (Signed)
BP 102/64   Pulse 80   Temp 97.9 F (36.6 C)   Ht 5\' 10"  (1.778 m)   Wt 202 lb (91.6 kg)   SpO2 98%   BMI 28.98 kg/m    Subjective:    Patient ID: Edward Fisher, male    DOB: [birth year], 57 y.o.   MRN: [id number]  HPI: Edward Fisher is a 57 y.o. male presenting on 12/18/2022 for Coronary Artery Disease   HPI   Chief Complaint  Patient presents with   Coronary Artery Disease     He is still working at the [employer] place but he says his boss is "taking it easy on him".     He says he is having No cp or sob.  His CABG ws in 2013.   He continues to see his cardiologist annually.    He says he feels tired.  He wonders if it's his meds.  He doesn't exercise.  He has appt with neurology tomorrow with Edward Fisher  Discussed Physical Therapy . Pt went in past but doesn't feel like it helped.   He did not continue any of the exercises he learned.   Pt says he is having some constipation.  He says he only drinks about 1 bottle of water each day.     Relevant past medical, surgical, family and social history reviewed and updated as indicated. Interim medical history since our last visit reviewed. Allergies and medications reviewed and updated.    Current Outpatient Medications:    aspirin 81 MG chewable tablet, Chew 81 mg by mouth every evening. TO START DOSAGE IN 6 MONTHS APPROXIMATELY July 2014, Disp: , Rfl:    atorvastatin (LIPITOR) 40 MG tablet, Take 1 tablet by mouth once daily, Disp: 30 tablet, Rfl: 2   carbidopa-levodopa (SINEMET IR) 25-100 MG tablet, Take 1 tablet by mouth 4 (four) times daily., Disp: , Rfl:    gabapentin (NEURONTIN) 300 MG capsule, Take 300 mg by mouth 4 (four) times daily., Disp: , Rfl:    metoprolol succinate (TOPROL-XL) 25 MG 24 hr tablet, Take 1 tablet by mouth once daily, Disp: 30 tablet, Rfl: 0   nitroGLYCERIN (NITROSTAT) 0.4 MG SL tablet, Place 1 tablet (0.4 mg total) under the tongue every 5 (five) minutes as needed for  chest pain., Disp: 25 tablet, Rfl: PRN   omega-3 acid ethyl esters (LOVAZA) 1 G capsule, Take 1 g by mouth every evening., Disp: , Rfl:    tamsulosin (FLOMAX) 0.4 MG CAPS capsule, Take 1 capsule by mouth once daily, Disp: 30 capsule, Rfl: 0   Review of Systems  Per HPI unless specifically indicated above     Objective:    BP 102/64   Pulse 80   Temp 97.9 F (36.6 C)   Ht 5\' 10"  (1.778 m)   Wt 202 lb (91.6 kg)   SpO2 98%   BMI 28.98 kg/m   Wt Readings from Last 3 Encounters:  12/18/22 202 lb (91.6 kg)  09/18/22 200 lb 8 oz (90.9 kg)  06/18/22 197 lb (89.4 kg)    Physical Exam Vitals reviewed.  Constitutional:      General: He is not in acute distress.    Appearance: He is well-developed. He is not ill-appearing.  HENT:     Head: Normocephalic and atraumatic.  Cardiovascular:     Rate and Rhythm: Normal rate and regular rhythm.  Pulmonary:     Effort: Pulmonary effort is normal.  Breath sounds: Normal breath sounds. No wheezing.  Abdominal:     General: Bowel sounds are normal.     Palpations: Abdomen is soft. There is no hepatomegaly, splenomegaly, mass or pulsatile mass.     Tenderness: There is no abdominal tenderness.  Musculoskeletal:     Cervical back: Neck supple.     Right lower leg: No edema.     Left lower leg: No edema.  Lymphadenopathy:     Cervical: No cervical adenopathy.  Skin:    General: Skin is warm and dry.  Neurological:     Mental Status: He is alert and oriented to person, place, and time.     Motor: Weakness and tremor present.     Comments: Mild weakness RUE, RLE.  Slight tremor right hand at rest  Psychiatric:        Attention and Perception: Attention normal.        Mood and Affect: Affect is not inappropriate.        Speech: Speech normal.        Behavior: Behavior normal. Behavior is cooperative.     Results for orders placed or performed during the hospital encounter of 12/16/22  Hemoglobin A1c  Result Value Ref Range    Hgb A1c MFr Bld 5.5 4.8 - 5.6 %   Mean Plasma Glucose 111 mg/dL  PSA  Result Value Ref Range   Prostatic Specific Antigen 1.22 0.00 - 4.00 ng/mL  Lipid panel  Result Value Ref Range   Cholesterol 94 0 - 200 mg/dL   Triglycerides 49 <161 mg/dL   HDL 29 (L) >09 mg/dL   Total CHOL/HDL Ratio 3.2 RATIO   VLDL 10 0 - 40 mg/dL   LDL Cholesterol 55 0 - 99 mg/dL  Comprehensive metabolic panel  Result Value Ref Range   Sodium 140 135 - 145 mmol/L   Potassium 3.8 3.5 - 5.1 mmol/L   Chloride 109 98 - 111 mmol/L   CO2 23 22 - 32 mmol/L   Glucose, Bld 115 (H) 70 - 99 mg/dL   BUN 17 6 - 20 mg/dL   Creatinine, Ser 6.04 0.61 - 1.24 mg/dL   Calcium 8.9 8.9 - 54.0 mg/dL   Total Protein 7.0 6.5 - 8.1 g/dL   Albumin 4.2 3.5 - 5.0 g/dL   AST 18 15 - 41 U/L   ALT 5 0 - 44 U/L   Alkaline Phosphatase 63 38 - 126 U/L   Total Bilirubin 0.5 0.3 - 1.2 mg/dL   GFR, Estimated >98 >11 mL/min   Anion gap 8 5 - 15      Assessment & Plan:    Encounter Diagnoses  Name Primary?   Arteriosclerotic cardiovascular disease (ASCVD) Yes   Bradykinesia    Constipation, unspecified constipation type       -reviewed labs with pt.  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  Discussed he should be drinking 5-6 bottles/water daily.  Recommended increase in the fiber in his diet.  He can use miralax if needed.  He is given reading information on this as well  -Pt to follow up 3 months.  He is to contact office sooner prn

## 2022-12-30 ENCOUNTER — Other Ambulatory Visit: Payer: Self-pay | Admitting: Physician Assistant

## 2023-01-02 ENCOUNTER — Encounter: Payer: Self-pay | Admitting: Radiology

## 2023-01-25 ENCOUNTER — Other Ambulatory Visit: Payer: Self-pay | Admitting: Physician Assistant

## 2023-02-19 ENCOUNTER — Other Ambulatory Visit: Payer: Self-pay | Admitting: Physician Assistant

## 2023-02-19 DIAGNOSIS — I2510 Atherosclerotic heart disease of native coronary artery without angina pectoris: Secondary | ICD-10-CM

## 2023-02-19 DIAGNOSIS — I10 Essential (primary) hypertension: Secondary | ICD-10-CM

## 2023-02-19 DIAGNOSIS — E785 Hyperlipidemia, unspecified: Secondary | ICD-10-CM

## 2023-03-14 ENCOUNTER — Other Ambulatory Visit (HOSPITAL_COMMUNITY)
Admission: RE | Admit: 2023-03-14 | Discharge: 2023-03-14 | Disposition: A | Discharge status: Home / Self Care | Payer: Self-pay | Source: Ambulatory Visit | Attending: Physician Assistant | Admitting: Physician Assistant

## 2023-03-14 DIAGNOSIS — I10 Essential (primary) hypertension: Secondary | ICD-10-CM | POA: Insufficient documentation

## 2023-03-14 DIAGNOSIS — I2510 Atherosclerotic heart disease of native coronary artery without angina pectoris: Secondary | ICD-10-CM | POA: Insufficient documentation

## 2023-03-14 DIAGNOSIS — E785 Hyperlipidemia, unspecified: Secondary | ICD-10-CM | POA: Insufficient documentation

## 2023-03-14 LAB — LIPID PANEL
Cholesterol: 93 mg/dL (ref 0–200)
HDL: 26 mg/dL — ABNORMAL LOW (ref >40)
LDL Cholesterol: 48 mg/dL (ref 0–99)
Total CHOL/HDL Ratio: 3.6 RATIO
Triglycerides: 97 mg/dL (ref <150)
VLDL: 19 mg/dL (ref 0–40)

## 2023-03-14 LAB — COMPREHENSIVE METABOLIC PANEL
ALT: 6 U/L (ref 0–44)
AST: 19 U/L (ref 15–41)
Albumin: 4 g/dL (ref 3.5–5.0)
Alkaline Phosphatase: 65 U/L (ref 38–126)
Anion gap: 9 (ref 5–15)
BUN: 21 mg/dL — ABNORMAL HIGH (ref 6–20)
CO2: 23 mmol/L (ref 22–32)
Calcium: 9.2 mg/dL (ref 8.9–10.3)
Chloride: 106 mmol/L (ref 98–111)
Creatinine, Ser: 1.01 mg/dL (ref 0.61–1.24)
GFR, Estimated: >60 mL/min (ref >60)
Glucose, Bld: 122 mg/dL — ABNORMAL HIGH (ref 70–99)
Potassium: 4.2 mmol/L (ref 3.5–5.1)
Sodium: 138 mmol/L (ref 135–145)
Total Bilirubin: 0.9 mg/dL (ref 0.3–1.2)
Total Protein: 7.2 g/dL (ref 6.5–8.1)

## 2023-03-18 ENCOUNTER — Encounter: Payer: Self-pay | Admitting: Physician Assistant

## 2023-03-18 ENCOUNTER — Ambulatory Visit: Payer: Self-pay | Admitting: Physician Assistant

## 2023-03-18 VITALS — BP 116/84 | HR 70 | Temp 98.1°F | Ht 70.0 in | Wt 205.0 lb

## 2023-03-18 DIAGNOSIS — Z951 Presence of aortocoronary bypass graft: Secondary | ICD-10-CM

## 2023-03-18 DIAGNOSIS — R258 Other abnormal involuntary movements: Secondary | ICD-10-CM

## 2023-03-18 DIAGNOSIS — E785 Hyperlipidemia, unspecified: Secondary | ICD-10-CM

## 2023-03-18 DIAGNOSIS — I2510 Atherosclerotic heart disease of native coronary artery without angina pectoris: Secondary | ICD-10-CM

## 2023-03-18 DIAGNOSIS — I10 Essential (primary) hypertension: Secondary | ICD-10-CM

## 2023-03-18 MED ORDER — NITROGLYCERIN 0.4 MG SL SUBL: 0.4000 mg | SUBLINGUAL_TABLET | SUBLINGUAL | 99 refills | Status: AC | PRN

## 2023-03-18 NOTE — Patient Instructions (Addendum)
Cardiology - [phone] - Dr Antoine Poche  Update enrollment and [employer] financial assistance

## 2023-03-18 NOTE — Progress Notes (Unsigned)
BP 116/84   Pulse 70   Temp 98.1 F (36.7 C)   Ht 5\' 10"  (1.778 m)   Wt 205 lb (93 kg)   SpO2 98%   BMI 29.41 kg/m    Subjective:    Patient ID: Edward Fisher, male    DOB: 11-Jul-1966, 57 y.o.   MRN: [id number]  HPI: Edward Fisher is a 57 y.o. male presenting on 03/18/2023 for Coronary Artery Disease   HPI  Chief Complaint  Patient presents with   Coronary Artery Disease    Pt is 56yoM who is in today for routine follow up.  He says he has had a few chest pains.  No associated sob or nausea.  He says the pains Come both with exertion and at rest..  he says the pains only last about a minute.   He says he has never taken his nitrostat but always carries it with him.   it has been a year since he had his last follow up with his cardiologist.   Pt had cabg in 2013.   Pt is employed as Chief Technology Officer.   Pt is followed by neurology for parkinson-like illness.    Relevant past medical, surgical, family and social history reviewed and updated as indicated. Interim medical history since our last visit reviewed. Allergies and medications reviewed and updated.   Current Outpatient Medications:    aspirin 81 MG chewable tablet, Chew 81 mg by mouth every evening. TO START DOSAGE IN 6 MONTHS APPROXIMATELY July 2014, Disp: , Rfl:    atorvastatin (LIPITOR) 40 MG tablet, Take 1 tablet by mouth once daily, Disp: 30 tablet, Rfl: 3   carbidopa-levodopa (SINEMET IR) 25-100 MG tablet, Take 1 tablet by mouth 5 (five) times daily., Disp: , Rfl:    gabapentin (NEURONTIN) 300 MG capsule, Take 300 mg by mouth 5 (five) times daily., Disp: , Rfl:    metoprolol succinate (TOPROL-XL) 25 MG 24 hr tablet, Take 1 tablet by mouth once daily, Disp: 30 tablet, Rfl: 4   nitroGLYCERIN (NITROSTAT) 0.4 MG SL tablet, Place 1 tablet (0.4 mg total) under the tongue every 5 (five) minutes as needed for chest pain., Disp: 25 tablet, Rfl: PRN   omega-3 acid ethyl esters (LOVAZA) 1 G capsule, Take 1 g by  mouth every evening., Disp: , Rfl:    tamsulosin (FLOMAX) 0.4 MG CAPS capsule, Take 1 capsule (0.4 mg total) by mouth daily., Disp: 30 capsule, Rfl: 3    Review of Systems  Per HPI unless specifically indicated above     Objective:    BP 116/84   Pulse 70   Temp 98.1 F (36.7 C)   Ht 5\' 10"  (1.778 m)   Wt 205 lb (93 kg)   SpO2 98%   BMI 29.41 kg/m   Wt Readings from Last 3 Encounters:  03/18/23 205 lb (93 kg)  12/18/22 202 lb (91.6 kg)  09/18/22 200 lb 8 oz (90.9 kg)    Physical Exam Vitals reviewed.  Constitutional:      General: He is not in acute distress.    Appearance: He is well-developed. He is not toxic-appearing.  HENT:     Head: Normocephalic and atraumatic.  Cardiovascular:     Rate and Rhythm: Normal rate and regular rhythm.  Pulmonary:     Effort: Pulmonary effort is normal. No respiratory distress.     Breath sounds: Normal breath sounds. No stridor. No wheezing or rhonchi.  Chest:  Chest wall: No tenderness.  Abdominal:     General: Bowel sounds are normal.     Palpations: Abdomen is soft.     Tenderness: There is no abdominal tenderness.  Musculoskeletal:     Cervical back: Neck supple.     Right lower leg: No edema.     Left lower leg: No edema.  Lymphadenopathy:     Cervical: No cervical adenopathy.  Skin:    General: Skin is warm and dry.  Neurological:     Mental Status: He is alert and oriented to person, place, and time.  Psychiatric:        Behavior: Behavior normal.     Results for orders placed or performed during the hospital encounter of 03/14/23  Lipid panel  Result Value Ref Range   Cholesterol 93 0 - 200 mg/dL   Triglycerides 97 <409 mg/dL   HDL 26 (L) >81 mg/dL   Total CHOL/HDL Ratio 3.6 RATIO   VLDL 19 0 - 40 mg/dL   LDL Cholesterol 48 0 - 99 mg/dL  Comprehensive metabolic panel  Result Value Ref Range   Sodium 138 135 - 145 mmol/L   Potassium 4.2 3.5 - 5.1 mmol/L   Chloride 106 98 - 111 mmol/L   CO2 23 22 -  32 mmol/L   Glucose, Bld 122 (H) 70 - 99 mg/dL   BUN 21 (H) 6 - 20 mg/dL   Creatinine, Ser 1.91 0.61 - 1.24 mg/dL   Calcium 9.2 8.9 - 47.8 mg/dL   Total Protein 7.2 6.5 - 8.1 g/dL   Albumin 4.0 3.5 - 5.0 g/dL   AST 19 15 - 41 U/L   ALT 6 0 - 44 U/L   Alkaline Phosphatase 65 38 - 126 U/L   Total Bilirubin 0.9 0.3 - 1.2 mg/dL   GFR, Estimated >29 >56 mL/min   Anion gap 9 5 - 15    EKG- SR at 59bpm.   Some abnormal T wave seen in EKG done 03/27/22     Assessment & Plan:    Encounter Diagnoses  Name Primary?   Arteriosclerotic cardiovascular disease (ASCVD) Yes   Essential hypertension    Hyperlipidemia, unspecified hyperlipidemia type    S/P CABG (coronary artery bypass graft)    Bradykinesia   \  -reviewed labs with pt -pt is encouraged to contact cardiology to schedule appointment as he is due for his annual appointment and he is having chest pains which may or may not be cardiac in nature.  Pt is given new rx nitrostat and is encouraged to use it when he gets the CP.  Pt is recommended to call this afternoon to work on getting appointment scheduled.  He can contact our office if he has any difficulties with this -pt is encouraged to Update enrollemnt with [facility] and update his [employer] financial assistance -pt to continue with neurology per their recommendation -no changes to pt medications today.  Pt was sent a fresh rx for nitrostat as per pharmacy it had been 2 years since last filled -pt to follow up here 3 months.  He is to contact office sooner for any worsening or new symptoms

## 2023-03-19 DIAGNOSIS — Z951 Presence of aortocoronary bypass graft: Secondary | ICD-10-CM | POA: Insufficient documentation

## 2023-03-26 ENCOUNTER — Encounter: Payer: Self-pay | Admitting: *Deleted

## 2023-03-26 ENCOUNTER — Ambulatory Visit (INDEPENDENT_AMBULATORY_CARE_PROVIDER_SITE_OTHER): Payer: Self-pay | Admitting: Cardiology

## 2023-03-26 ENCOUNTER — Encounter: Payer: Self-pay | Admitting: Cardiology

## 2023-03-26 VITALS — BP 100/70 | HR 68 | Ht 70.0 in | Wt 203.0 lb

## 2023-03-26 DIAGNOSIS — R072 Precordial pain: Secondary | ICD-10-CM

## 2023-03-26 DIAGNOSIS — I2510 Atherosclerotic heart disease of native coronary artery without angina pectoris: Secondary | ICD-10-CM

## 2023-03-26 DIAGNOSIS — E782 Mixed hyperlipidemia: Secondary | ICD-10-CM

## 2023-03-26 NOTE — Progress Notes (Signed)
  Cardiology Office Note:   Date:  03/26/2023  ID:  Edward Fisher, DOB October 18, 1966, MRN [id number]  History of Present Illness:   Edward Fisher is a 57 y.o. male who presents for follow up of CAD (CABG 2013 LIMA-LAD, left radial-circumflex, SVG-PL-PDA).  Since he was last seen he has had some chest discomfort.  This seems to be happening for 2 to 3 months.  It is sharp.  It seems to come on sporadically.  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  He does not bring on the symptoms when he is doing that work.  It comes on at rest.  His mid chest discomfort.  It is 4-10 in intensity.  He thinks that since his previous angina.  He does not describe associated nausea vomiting or diaphoresis.  He does not have radiation to his neck or to his arms.  He has no shortness of breath, PND or orthopnea.  He had no weight gain or edema.  He has not felt like he needed to take a nitroglycerin.  ROS: Right-sided weakness with some mild right-sided swelling  Studies Reviewed:    EKG: 03/18/2023 sinus rhythm, rate 59, axis within normal limits, intervals within normal limits, anterolateral T wave inversions slightly more pronounced than previous.    Risk Assessment/Calculations:              Physical Exam:   VS:  BP 100/70   Pulse 68   Ht 5\' 10"  (1.778 m)   Wt 203 lb (92.1 kg)   BMI 29.13 kg/m    Wt Readings from Last 3 Encounters:  03/26/23 203 lb (92.1 kg)  03/18/23 205 lb (93 kg)  12/18/22 202 lb (91.6 kg)     GEN: Well nourished, well developed in no acute distress NECK: No JVD; No carotid bruits CARDIAC: RRR, no murmurs, rubs, gallops RESPIRATORY:  Clear to auscultation without rales, wheezing or rhonchi  ABDOMEN: Soft, non-tender, non-distended EXTREMITIES:  No edema; No deformity   ASSESSMENT AND PLAN:    CAD in native artery:   He has some chest discomfort and known coronary disease.  Will try to do a treadmill on him last year but he had that  inadequate result though no overt abnormalities.  Because of his Parkinson's he would not be able to try treadmill again.  Therefore, he will have a [employer].   Dyslipidemia:    LDL was 48 with an HDL of 26.  No change in therapy.         Signed, Rollene Rotunda, MD

## 2023-03-26 NOTE — Patient Instructions (Signed)
Medication Instructions:  The current medical regimen is effective;  continue present plan and medications.  *If you need a refill on your cardiac medications before your next appointment, please call your pharmacy*  Lab Work: None today If you have labs (blood work) drawn today and your tests are completely normal, you will receive your results only by: MyChart Message (if you have MyChart) OR A paper copy in the mail If you have any lab test that is abnormal or we need to change your treatment, we will call you to review the results.  Testing/Procedures: Your physician has requested that you have a lexiscan myoview. For further information please visit [email or web address] Please follow instruction sheet, as given. You will be contacted to be scheduled.  Follow-Up: At [facility], you and your health needs are our priority.  As part of our continuing mission to provide you with exceptional heart care, we have created designated Provider Care Teams.  These Care Teams include your primary Cardiologist (physician) and Advanced Practice Providers (APPs -  Physician Assistants and Nurse Practitioners) who all work together to provide you with the care you need, when you need it.  We recommend signing up for the patient portal called "MyChart".  Sign up information is provided on this After Visit Summary.  MyChart is used to connect with patients for Virtual Visits (Telemedicine).  Patients are able to view lab/test results, encounter notes, upcoming appointments, etc.  Non-urgent messages can be sent to your provider as well.   To learn more about what you can do with MyChart, go to [email or web address].    Your next appointment:   6 month(s)  Provider:   Rollene Rotunda, MD

## 2023-04-01 ENCOUNTER — Encounter: Payer: Self-pay | Admitting: Physician Assistant

## 2023-04-01 ENCOUNTER — Ambulatory Visit: Payer: Self-pay | Admitting: Physician Assistant

## 2023-04-01 VITALS — BP 115/64 | HR 66 | Temp 98.0°F | Ht 70.0 in | Wt 204.0 lb

## 2023-04-01 DIAGNOSIS — L089 Local infection of the skin and subcutaneous tissue, unspecified: Secondary | ICD-10-CM

## 2023-04-01 MED ORDER — SULFAMETHOXAZOLE-TRIMETHOPRIM 800-160 MG PO TABS: 1.0000 | ORAL_TABLET | Freq: Two times a day (BID) | ORAL | 0 refills | Status: DC

## 2023-04-01 NOTE — Patient Instructions (Addendum)
Take out packing Wednesday evening.    ----------------------------------------   Incision and Drainage, Care After After incision and drainage, it is common to have: Pain or discomfort around the incision site. Blood, fluid, or pus (drainage) from the incision. Redness and firm skin around the incision site. Follow these instructions at home: Medicines Take over-the-counter and prescription medicines only as told by your health care provider. If you were prescribed antibiotics, take them as told by your provider. Do not stop using the antibiotic even if you start to feel better. Do not apply creams, ointments, or liquids unless you have been told to by your provider. Wound care Follow instructions from your provider about how to take care of your wound. Make sure you: Wash your hands with soap and water for at least 20 seconds before and after you change your bandage (dressing). If soap and water are not available, use hand sanitizer. Change your dressing and any packing as told by your provider. If the dressing is dry or stuck when you try to remove it, moisten or wet it with saline or water. This will help you remove it without harming your skin or tissues. If your wound is packed, leave it in place until your provider tells you to remove it. To remove it, moisten or wet the packing with saline or water. Leave stitches (sutures), skin glue, or tape strips in place. These skin closures may need to stay in place for 2 weeks or longer. If tape strip edges start to loosen and curl up, you may trim the loose edges. Do not remove tape strips completely unless your provider tells you to do that. Check your wound every day for signs of infection. Check for: More redness, swelling, or pain. More fluid or blood. Warmth. Pus or a bad smell. If you were sent home with a drain tube in place, follow instructions from your provider about: How to empty it. How to care for it at home. Be careful when  you get rid of used dressings, wound packing, or drainage. Activity Rest the affected area. Return to your normal activities as told by your provider. Ask your provider what activities are safe for you. General instructions Do not use any products that contain nicotine or tobacco. These products include cigarettes, chewing tobacco, and vaping devices, such as e-cigarettes. These can delay incision healing after surgery. If you need help quitting, ask your provider. Do not take baths, swim, or use a hot tub until your provider approves. Ask your provider if you may take showers. You may only be allowed to take sponge baths. The incision will keep draining. It is normal to have some clear or slightly bloody drainage. The amount of drainage should go down each day. Keep all follow-up visits. Your provider will need to make sure that your incision is healing well and that there are no problems. Your health care provider may give you more instructions. Make sure you know what you can and cannot do Contact a health care provider if: Your cyst or abscess comes back. You have any signs of infection. You notice red streaks that spread away from the incision site. You have a fever or chills. Get help right away if: You have severe pain or bleeding. You become short of breath. You have chest pain. You have signs of a severe infection. You may notice changes in your incision area, such as: Swelling that makes the skin feel hard. Numbness or tingling. Sudden increase in redness. Your skin  color may change from red to purple, and then to dark spots. Blisters, ulcers, or splitting of the skin. These symptoms may be an emergency. Get help right away. Call 911. Do not wait to see if the symptoms will go away. Do not drive yourself to the hospital. This information is not intended to replace advice given to you by your health care provider. Make sure you discuss any questions you have with your health care  provider. Document Revised: 06/10/2022 Document Reviewed: 06/10/2022 Elsevier Patient Education  2024 [employer].

## 2023-04-01 NOTE — Progress Notes (Signed)
   BP 115/64   Pulse 66   Temp 98 F (36.7 C)   Ht 5\' 10"  (1.778 m)   Wt 204 lb (92.5 kg)   SpO2 98%   BMI 29.27 kg/m    Subjective:    Patient ID: Edward Fisher, male    DOB: Jul 07, 1966, 57 y.o.   MRN: [id number]  HPI: Edward Fisher is a 57 y.o. male presenting on 04/01/2023 for Cyst (On back - had been bothering him for the past 2-3 weeks but got worse yesterday)   HPI  Relevant past medical, surgical, family and social history reviewed and updated as indicated. Interim medical history since our last visit reviewed. Allergies and medications reviewed and updated.  Review of Systems  Per HPI unless specifically indicated above     Objective:    BP 115/64   Pulse 66   Temp 98 F (36.7 C)   Ht 5\' 10"  (1.778 m)   Wt 204 lb (92.5 kg)   SpO2 98%   BMI 29.27 kg/m   Wt Readings from Last 3 Encounters:  04/01/23 204 lb (92.5 kg)  03/26/23 203 lb (92.1 kg)  03/18/23 205 lb (93 kg)    Physical Exam Constitutional:      General: He is not in acute distress.    Appearance: He is not toxic-appearing.  HENT:     Head: Normocephalic and atraumatic.  Pulmonary:     Effort: Pulmonary effort is normal. No respiratory distress.  Skin:    Comments:  Large sebaceous cyst on back is red and inflamed with no active draining    Neurological:     Mental Status: He is alert and oriented to person, place, and time.           Assessment & Plan:    Encounter Diagnosis  Name Primary?   Infected sebaceous cyst Yes     Discussed I&D which pt has had in the past  pt signed informed consent   Diagnosis: abscess - Location: back Procedure: Incision & drainage Informed consent:  Discussed risks (  infection, pain, bleeding, bruising, scarring, numbness, and recurrence of the condition) and benefits of the procedure, as well as the alternatives.  Informed consent was obtained. Anesthesia: 2cc of 1 % lidocaine Type:  The area was prepared and draped in a standard  fashion. Copious pus and sebum expressed.   Plain gauze packing into abscess after drained Dry sterile dressing applied The patient tolerated the procedure well. The patient was instructed on post-op care.    Pt given reading info on aftercare.  He is to remove packing after work tomorrow (because this is convenient for him since he can get his son to do it; pt lives alone.  Rx septra ds  Pt educated on care.  He is to RTO if needed

## 2023-04-08 ENCOUNTER — Ambulatory Visit (HOSPITAL_COMMUNITY)
Admission: RE | Admit: 2023-04-08 | Discharge: 2023-04-08 | Disposition: A | Discharge status: Home / Self Care | Payer: Self-pay | Source: Ambulatory Visit | Attending: Cardiology | Admitting: Cardiology

## 2023-04-08 DIAGNOSIS — I2510 Atherosclerotic heart disease of native coronary artery without angina pectoris: Secondary | ICD-10-CM | POA: Insufficient documentation

## 2023-04-08 DIAGNOSIS — R072 Precordial pain: Secondary | ICD-10-CM | POA: Insufficient documentation

## 2023-04-08 LAB — NM MYOCAR MULTI W/SPECT W/WALL MOTION / EF
Base ST Depression (mm): 0 mm
LV dias vol: 88 mL (ref 62–150)
LV sys vol: 28 mL
Nuc Stress EF: 68 %
Peak HR: 93 {beats}/min
RATE: 0.4
Rest HR: 53 {beats}/min
Rest Nuclear Isotope Dose: 10.7 mCi
SDS: 0
SRS: 0
SSS: 0
ST Depression (mm): 0 mm
Stress Nuclear Isotope Dose: 30 mCi
TID: 1.06

## 2023-04-08 MED ORDER — SODIUM CHLORIDE FLUSH 0.9 % IV SOLN
INTRAVENOUS | Status: AC
  Administered 2023-04-08: 10 mL via INTRAVENOUS
  Filled 2023-04-08: qty 10

## 2023-04-08 MED ORDER — TECHNETIUM TC 99M TETROFOSMIN IV KIT
10.0000 | PACK | Freq: Once | INTRAVENOUS | Status: AC | PRN
  Administered 2023-04-08: 10.7 via INTRAVENOUS

## 2023-04-08 MED ORDER — TECHNETIUM TC 99M TETROFOSMIN IV KIT
30.0000 | PACK | Freq: Once | INTRAVENOUS | Status: AC | PRN
  Administered 2023-04-08: 30 via INTRAVENOUS

## 2023-04-08 MED ORDER — REGADENOSON 0.4 MG/5ML IV SOLN
INTRAVENOUS | Status: AC
  Administered 2023-04-08: 0.4 mg via INTRAVENOUS
  Filled 2023-04-08: qty 5

## 2023-04-14 ENCOUNTER — Encounter: Payer: Self-pay | Admitting: *Deleted

## 2023-05-06 ENCOUNTER — Other Ambulatory Visit: Payer: Self-pay | Admitting: Physician Assistant

## 2023-06-04 ENCOUNTER — Telehealth: Payer: Self-pay

## 2023-06-04 NOTE — Telephone Encounter (Signed)
Returned call to [facility] client who wants to renew his enrollment. Discussed needed documents and scheduled enrollment for 06/13/23 at 0900 at [facility] office.  Client's PCP has been [facility]. Next appointment 06/18/23 and cardiology appointment 09/24/23  At client's request information address, date and time of appointment and documents needed to renew were sent via text message.   Francee Nodal RN [employer]

## 2023-06-09 ENCOUNTER — Other Ambulatory Visit: Payer: Self-pay | Admitting: Physician Assistant

## 2023-06-18 ENCOUNTER — Ambulatory Visit: Payer: Self-pay | Admitting: Physician Assistant

## 2023-06-18 ENCOUNTER — Encounter: Payer: Self-pay | Admitting: Physician Assistant

## 2023-06-18 ENCOUNTER — Ambulatory Visit: Payer: Self-pay | Admitting: Cardiology

## 2023-06-18 VITALS — BP 95/58 | HR 81 | Temp 97.9°F | Ht 70.0 in | Wt 200.8 lb

## 2023-06-18 DIAGNOSIS — Z951 Presence of aortocoronary bypass graft: Secondary | ICD-10-CM

## 2023-06-18 DIAGNOSIS — G8929 Other chronic pain: Secondary | ICD-10-CM

## 2023-06-18 DIAGNOSIS — I2510 Atherosclerotic heart disease of native coronary artery without angina pectoris: Secondary | ICD-10-CM

## 2023-06-18 DIAGNOSIS — R258 Other abnormal involuntary movements: Secondary | ICD-10-CM

## 2023-06-18 MED ORDER — METOPROLOL SUCCINATE ER 25 MG PO TB24: 12.5000 mg | ORAL_TABLET | Freq: Every day | ORAL | Status: DC

## 2023-06-18 NOTE — Patient Instructions (Addendum)
Oct. 4 at 10:15AM with Enrigue Catena. Edward Gess, MD [facility] Neurology [facility])  [address]  [address]  [place], [place] [address]  [phone]

## 2023-06-18 NOTE — Progress Notes (Signed)
BP (!) 89/50   Pulse 81   Temp 97.9 F (36.6 C)   Ht 5\' 10"  (1.778 m)   Wt 200 lb 12 oz (91.1 kg)   SpO2 97%   BMI 28.80 kg/m    Subjective:    Patient ID: Edward Fisher, male    DOB: 1966/03/17, 57 y.o.   MRN: [id number]  HPI: Edward Fisher is a 57 y.o. male presenting on 06/18/2023 for Coronary Artery Disease   HPI  Chief Complaint  Patient presents with   Coronary Artery Disease    Pt is in today for routine follow up.   He is followed by cardiology for CAD and as appointment there in November.  He is followed by neurology for parkinsons disease and he does not have a follow up scheduled.  He says he doesn't know when he is supposed to return there.  Pt says he Feels tired > a year.  He says he drinks about 4 glasses of tea or water daily.  He remembers talking about increasing that in the past but says he really hasn't.    He doesn't report sob or cp, just feeling tired.  He says he goes to work, sees his family, participates with singing, but admits that he just lost something when his wife died that he doesn't think he will ever get back.    Pt says he has back pain.  No recent changes, about the same. His neurologist rx gabapentin that he takes sometimes 5/day.  He says it makes "some" improvement.   pain doesn't radiate to extremities but says his legs give out when he walks.   He had injection by NS in 2023 that pt says it didn't help.  MRI  lumbar- 2022- DDD, disc protrusion, foraminal narrowing,  Cervial- 2022- osteophytes, stnosis, formainal narrowing,  thoracic- 2023- mild degen changest          Relevant past medical, surgical, family and social history reviewed and updated as indicated. Interim medical history since our last visit reviewed. Allergies and medications reviewed and updated.    Current Outpatient Medications:    aspirin 81 MG chewable tablet, Chew 81 mg by mouth every evening. TO START DOSAGE IN 6 MONTHS APPROXIMATELY July 2014, Disp: ,  Rfl:    atorvastatin (LIPITOR) 40 MG tablet, Take 1 tablet by mouth once daily, Disp: 30 tablet, Rfl: 3   carbidopa-levodopa (SINEMET IR) 25-100 MG tablet, Take 1 tablet by mouth 5 (five) times daily., Disp: , Rfl:    gabapentin (NEURONTIN) 300 MG capsule, Take 300 mg by mouth 5 (five) times daily., Disp: , Rfl:    metoprolol succinate (TOPROL-XL) 25 MG 24 hr tablet, Take 1 tablet by mouth once daily, Disp: 30 tablet, Rfl: 0   nitroGLYCERIN (NITROSTAT) 0.4 MG SL tablet, Place 1 tablet (0.4 mg total) under the tongue every 5 (five) minutes as needed for chest pain., Disp: 25 tablet, Rfl: PRN   tamsulosin (FLOMAX) 0.4 MG CAPS capsule, Take 1 capsule by mouth once daily, Disp: 30 capsule, Rfl: 0   omega-3 acid ethyl esters (LOVAZA) 1 G capsule, Take 1 g by mouth every evening. (Patient not taking: Reported on 06/18/2023), Disp: , Rfl:     Review of Systems  Per HPI unless specifically indicated above     Objective:    BP (!) 89/50   Pulse 81   Temp 97.9 F (36.6 C)   Ht 5\' 10"  (1.778 m)   Wt 200 lb 12  oz (91.1 kg)   SpO2 97%   BMI 28.80 kg/m   Wt Readings from Last 3 Encounters:  06/18/23 200 lb 12 oz (91.1 kg)  04/01/23 204 lb (92.5 kg)  03/26/23 203 lb (92.1 kg)     Vitals:   06/18/23 1349 06/18/23 1422  BP: (!) 89/50 (!) 95/58  Pulse: 81   Temp: 97.9 F (36.6 C)   SpO2: 97%       Physical Exam Vitals reviewed.  Constitutional:      General: He is not in acute distress.    Appearance: He is well-developed. He is not ill-appearing.  HENT:     Head: Normocephalic and atraumatic.  Cardiovascular:     Rate and Rhythm: Normal rate and regular rhythm.  Pulmonary:     Effort: Pulmonary effort is normal.     Breath sounds: Normal breath sounds. No wheezing.  Abdominal:     General: Bowel sounds are normal.     Palpations: Abdomen is soft.     Tenderness: There is no abdominal tenderness.  Musculoskeletal:     Cervical back: Neck supple.     Right lower leg: No  edema.     Left lower leg: No edema.  Lymphadenopathy:     Cervical: No cervical adenopathy.  Skin:    General: Skin is warm and dry.  Neurological:     Mental Status: He is alert and oriented to person, place, and time.     Gait: Gait abnormal.  Psychiatric:        Behavior: Behavior normal.            Assessment & Plan:   Encounter Diagnoses  Name Primary?   Arteriosclerotic cardiovascular disease (ASCVD) Yes   S/P CABG (coronary artery bypass graft)    Bradykinesia    Chronic back pain, unspecified back location, unspecified back pain laterality      BP -Reduce toprol to 1/2 tab daily -Increase drinks/water to 6 daily -pt to follow up 1 month for recheck bp  HCM -Refer for routine dental per pt request  Back pain -he has gabapentin to take -discussed with pt that legs giving out may be more related to parkinson's than DDD -will Refer to ortho for back pain to get input and determine other treatment options -Pt says he approved for cafa  Parkinsons -nurse called pt's neurologist and got follow up appointment scheduled for October -reminded pt that he needs to apply for [employer] financial assistance.  He was showed where on the website -discussed with pt that he should consider planning for future with who he would want making healthcare choices for him should that ever become needed.  He was encouraged to discuss with his family  CAD -cardiology f/u in November -on beta-blocker, statin, asa

## 2023-07-10 ENCOUNTER — Ambulatory Visit: Payer: Self-pay | Admitting: Orthopaedic Surgery

## 2023-07-15 ENCOUNTER — Other Ambulatory Visit: Payer: Self-pay | Admitting: Physician Assistant

## 2023-07-16 ENCOUNTER — Encounter: Payer: Self-pay | Admitting: Orthopaedic Surgery

## 2023-07-16 ENCOUNTER — Ambulatory Visit (INDEPENDENT_AMBULATORY_CARE_PROVIDER_SITE_OTHER): Payer: Self-pay | Admitting: Orthopaedic Surgery

## 2023-07-16 VITALS — Ht 70.0 in | Wt 203.0 lb

## 2023-07-16 DIAGNOSIS — M5136 Other intervertebral disc degeneration, lumbar region: Secondary | ICD-10-CM

## 2023-07-16 DIAGNOSIS — M51369 Other intervertebral disc degeneration, lumbar region without mention of lumbar back pain or lower extremity pain: Secondary | ICD-10-CM

## 2023-07-16 NOTE — Progress Notes (Unsigned)
Office Visit Note   Patient: Edward Fisher           Date of Birth: Mar 25, 1966           MRN: [id number] Visit Date: 07/16/2023              Requested by: Jacquelin Hawking, PA-C [address] [place],  [place] [address] PCP: Jacquelin Hawking, PA-C   Assessment & Plan: Visit Diagnoses: No diagnosis found.  Plan: ***  Follow-Up Instructions: No follow-ups on file.   Orders:  No orders of the defined types were placed in this encounter.  No orders of the defined types were placed in this encounter.     Procedures: No procedures performed   Clinical Data: No additional findings.   Subjective: Chief Complaint  Patient presents with   Middle Back - Pain, Numbness    Have some numbing pain that travels from center of back down the right side. I have had back problems for about 2 yrs . I have been diagnosed with Parkinsons    HPI  Review of Systems   Objective: Vital Signs: Ht 5\' 10"  (1.778 m)   Wt 203 lb (92.1 kg)   BMI 29.13 kg/m   Physical Exam  Ortho Exam  Specialty Comments:  No specialty comments available.  Imaging: No results found.   PMFS History: Patient Active Problem List   Diagnosis Date Noted   S/P CABG (coronary artery bypass graft) 03/19/2023   Weakness 03/26/2022   Tremor of right hand 05/02/2020   Uncontrolled REM sleep behavior disorder 05/02/2020   Hemiparesis of right dominant side (HCC) 05/02/2020   Chest pain 06/22/2014   Essential hypertension 06/22/2014   Belching 06/22/2014   Local skin infection 03/16/2013   Arteriosclerotic cardiovascular disease (ASCVD) 06/26/2012   Hyperlipidemia    Past Medical History:  Diagnosis Date   Arteriosclerotic cardiovascular disease (ASCVD)    Onset in 04/2012; CABG in 06/2012 w/ LIMA-LAD, L Rad-CFX, SVG-PL-PDA   Hyperlipidemia    goal LDL < 70   Insomnia    Nephrolithiasis     Family History  Problem Relation Age of Onset   Hypertension Mother    Ulcers Father        Peptic  ulcer disease   Coronary artery disease Father        Stents   Coronary artery disease Brother        CABG    Past Surgical History:  Procedure Laterality Date   CORONARY ARTERY BYPASS GRAFT  06/24/2012   Procedure: CORONARY ARTERY BYPASS GRAFTING (CABG);  Surgeon: Delight Ovens, MD;  Location: [facility] OR;  Service: Open Heart Surgery;  Laterality: N/A;  Coronary Artery  Bypass X 4 utilizing endoscopic saphenous vein graft and  Left radial Artery Harvest    CYSTOSCOPY W/ RETROGRADES Left 08/23/2015   Procedure: CYSTOSCOPY WITH RETROGRADE PYELOGRAM;  Surgeon: Malen Gauze, MD;  Location: [facility] ORS;  Service: Urology;  Laterality: Left;   CYSTOSCOPY WITH URETEROSCOPY, STONE BASKETRY AND STENT PLACEMENT Left 08/23/2015   Procedure: CYSTOSCOPY WITH URETEROSCOPY, STONE BASKET EXTRACTION;  Surgeon: Malen Gauze, MD;  Location: [facility] ORS;  Service: Urology;  Laterality: Left;   HOLMIUM LASER APPLICATION Left 08/23/2015   Procedure: HOLMIUM LASER APPLICATION;  Surgeon: Malen Gauze, MD;  Location: [facility] ORS;  Service: Urology;  Laterality: Left;   LEFT HEART CATHETERIZATION WITH CORONARY ANGIOGRAM N/A 06/23/2012   Procedure: LEFT HEART CATHETERIZATION WITH CORONARY ANGIOGRAM;  Surgeon: [name], MD;  Location: [facility] CATH LAB;  Service: Cardiovascular;  Laterality: N/A;   RHINOPLASTY     TONSILLECTOMY  ~1971   Social History   Occupational History   Occupation: Therapist, 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

## 2023-07-17 DIAGNOSIS — M51369 Other intervertebral disc degeneration, lumbar region without mention of lumbar back pain or lower extremity pain: Secondary | ICD-10-CM | POA: Insufficient documentation

## 2023-07-17 DIAGNOSIS — M5136 Other intervertebral disc degeneration, lumbar region: Secondary | ICD-10-CM | POA: Insufficient documentation

## 2023-07-22 ENCOUNTER — Ambulatory Visit: Payer: Self-pay | Admitting: Physician Assistant

## 2023-07-22 ENCOUNTER — Encounter: Payer: Self-pay | Admitting: Physician Assistant

## 2023-07-22 VITALS — BP 105/69 | HR 68 | Temp 97.9°F | Ht 70.0 in | Wt 203.0 lb

## 2023-07-22 DIAGNOSIS — Z1211 Encounter for screening for malignant neoplasm of colon: Secondary | ICD-10-CM

## 2023-07-22 DIAGNOSIS — I2510 Atherosclerotic heart disease of native coronary artery without angina pectoris: Secondary | ICD-10-CM

## 2023-07-22 DIAGNOSIS — K5900 Constipation, unspecified: Secondary | ICD-10-CM

## 2023-07-22 NOTE — Patient Instructions (Signed)

## 2023-07-22 NOTE — Progress Notes (Signed)
BP 105/69   Pulse 68   Temp 97.9 F (36.6 C)   Ht 5\' 10"  (1.778 m)   Wt 203 lb (92.1 kg)   SpO2 99%   BMI 29.13 kg/m    Subjective:    Patient ID: Edward Fisher, male    DOB: 04/25/1966, 57 y.o.   MRN: [id number]  HPI: Edward Fisher is a 57 y.o. male presenting on 07/22/2023 for Blood Pressure Check   HPI   He says he still gets tired and can't really tell difference in 1/2 toprol vs 25mg .   He says he is constipated.  He admits to not eating healthy and not drinking enough water.   Relevant past medical, surgical, family and social history reviewed and updated as indicated. Interim medical history since our last visit reviewed. Allergies and medications reviewed and updated.   Current Outpatient Medications:    aspirin 81 MG chewable tablet, Chew 81 mg by mouth every evening. TO START DOSAGE IN 6 MONTHS APPROXIMATELY July 2014, Disp: , Rfl:    atorvastatin (LIPITOR) 40 MG tablet, Take 1 tablet by mouth once daily, Disp: 30 tablet, Rfl: 3   carbidopa-levodopa (SINEMET IR) 25-100 MG tablet, Take 1 tablet by mouth 5 (five) times daily., Disp: , Rfl:    gabapentin (NEURONTIN) 300 MG capsule, Take 300 mg by mouth 5 (five) times daily., Disp: , Rfl:    metoprolol succinate (TOPROL-XL) 25 MG 24 hr tablet, Take 0.5 tablets (12.5 mg total) by mouth daily., Disp: , Rfl:    nitroGLYCERIN (NITROSTAT) 0.4 MG SL tablet, Place 1 tablet (0.4 mg total) under the tongue every 5 (five) minutes as needed for chest pain., Disp: 25 tablet, Rfl: PRN   omega-3 acid ethyl esters (LOVAZA) 1 G capsule, Take 1 g by mouth every evening., Disp: , Rfl:    tamsulosin (FLOMAX) 0.4 MG CAPS capsule, Take 1 capsule by mouth once daily, Disp: 30 capsule, Rfl: 0      Review of Systems  Per HPI unless specifically indicated above     Objective:    BP 105/69   Pulse 68   Temp 97.9 F (36.6 C)   Ht 5\' 10"  (1.778 m)   Wt 203 lb (92.1 kg)   SpO2 99%   BMI 29.13 kg/m   Wt Readings from Last 3  Encounters:  07/22/23 203 lb (92.1 kg)  07/16/23 203 lb (92.1 kg)  06/18/23 200 lb 12 oz (91.1 kg)    Physical Exam Vitals reviewed.  Constitutional:      General: He is not in acute distress.    Appearance: He is well-developed. He is not toxic-appearing.  HENT:     Head: Normocephalic and atraumatic.  Cardiovascular:     Rate and Rhythm: Normal rate and regular rhythm.  Pulmonary:     Effort: Pulmonary effort is normal.     Breath sounds: Normal breath sounds. No wheezing.  Abdominal:     General: Bowel sounds are normal.     Palpations: Abdomen is soft.     Tenderness: There is no abdominal tenderness.  Musculoskeletal:     Cervical back: Neck supple.     Right lower leg: No edema.     Left lower leg: No edema.  Lymphadenopathy:     Cervical: No cervical adenopathy.  Skin:    General: Skin is warm and dry.  Neurological:     Mental Status: He is alert and oriented to person, place, and time.  Psychiatric:  Behavior: Behavior normal.           Assessment & Plan:    Encounter Diagnoses  Name Primary?   Arteriosclerotic cardiovascular disease (ASCVD) Yes   Screening for colon cancer    Constipation, unspecified constipation type      Constipation -pt counseled on constipation and was given reading information -encouraged to increase fiber in his diet and drink more water  Bp -improved with 12.5mg  toprol.  Continue this  Cad -f/u November with cardiology as scheduled  Parkinsons  -f/u October with neurology as scheduled  HCM -pt was given FIT test for colon cancer screening   -pt to follow up here 2 months. He is to contact office sooner prn

## 2023-08-02 ENCOUNTER — Other Ambulatory Visit: Payer: Self-pay | Admitting: Physician Assistant

## 2023-08-14 ENCOUNTER — Other Ambulatory Visit: Payer: Self-pay | Admitting: Physician Assistant

## 2023-08-26 ENCOUNTER — Other Ambulatory Visit: Payer: Self-pay | Admitting: Physician Assistant

## 2023-08-26 DIAGNOSIS — I10 Essential (primary) hypertension: Secondary | ICD-10-CM

## 2023-08-26 DIAGNOSIS — I2510 Atherosclerotic heart disease of native coronary artery without angina pectoris: Secondary | ICD-10-CM

## 2023-08-26 DIAGNOSIS — E785 Hyperlipidemia, unspecified: Secondary | ICD-10-CM

## 2023-09-05 ENCOUNTER — Other Ambulatory Visit (HOSPITAL_COMMUNITY)
Admission: RE | Admit: 2023-09-05 | Discharge: 2023-09-05 | Disposition: A | Discharge status: Home / Self Care | Payer: Self-pay | Source: Ambulatory Visit | Attending: Physician Assistant | Admitting: Physician Assistant

## 2023-09-05 DIAGNOSIS — E785 Hyperlipidemia, unspecified: Secondary | ICD-10-CM | POA: Insufficient documentation

## 2023-09-05 DIAGNOSIS — I10 Essential (primary) hypertension: Secondary | ICD-10-CM | POA: Insufficient documentation

## 2023-09-05 DIAGNOSIS — I2510 Atherosclerotic heart disease of native coronary artery without angina pectoris: Secondary | ICD-10-CM | POA: Insufficient documentation

## 2023-09-05 LAB — LIPID PANEL
Cholesterol: 101 mg/dL (ref 0–200)
HDL: 29 mg/dL — ABNORMAL LOW (ref >40)
LDL Cholesterol: 54 mg/dL (ref 0–99)
Total CHOL/HDL Ratio: 3.5 {ratio}
Triglycerides: 90 mg/dL (ref <150)
VLDL: 18 mg/dL (ref 0–40)

## 2023-09-05 LAB — COMPREHENSIVE METABOLIC PANEL
ALT: 14 U/L (ref 0–44)
AST: 16 U/L (ref 15–41)
Albumin: 4.2 g/dL (ref 3.5–5.0)
Alkaline Phosphatase: 62 U/L (ref 38–126)
Anion gap: 8 (ref 5–15)
BUN: 16 mg/dL (ref 6–20)
CO2: 25 mmol/L (ref 22–32)
Calcium: 9.4 mg/dL (ref 8.9–10.3)
Chloride: 105 mmol/L (ref 98–111)
Creatinine, Ser: 1 mg/dL (ref 0.61–1.24)
GFR, Estimated: >60 mL/min (ref >60)
Glucose, Bld: 117 mg/dL — ABNORMAL HIGH (ref 70–99)
Potassium: 4.4 mmol/L (ref 3.5–5.1)
Sodium: 138 mmol/L (ref 135–145)
Total Bilirubin: 0.7 mg/dL (ref 0.3–1.2)
Total Protein: 7.2 g/dL (ref 6.5–8.1)

## 2023-09-09 ENCOUNTER — Encounter: Payer: Self-pay | Admitting: Physician Assistant

## 2023-09-09 ENCOUNTER — Ambulatory Visit: Payer: Self-pay | Admitting: Physician Assistant

## 2023-09-09 ENCOUNTER — Other Ambulatory Visit: Payer: Self-pay | Admitting: Physician Assistant

## 2023-09-09 VITALS — BP 106/60 | HR 87 | Temp 98.4°F | Ht 70.0 in | Wt 204.5 lb

## 2023-09-09 DIAGNOSIS — L84 Corns and callosities: Secondary | ICD-10-CM

## 2023-09-09 DIAGNOSIS — I2510 Atherosclerotic heart disease of native coronary artery without angina pectoris: Secondary | ICD-10-CM

## 2023-09-09 DIAGNOSIS — Z2821 Immunization not carried out because of patient refusal: Secondary | ICD-10-CM

## 2023-09-09 DIAGNOSIS — N529 Male erectile dysfunction, unspecified: Secondary | ICD-10-CM

## 2023-09-09 DIAGNOSIS — R258 Other abnormal involuntary movements: Secondary | ICD-10-CM

## 2023-09-09 DIAGNOSIS — Z1211 Encounter for screening for malignant neoplasm of colon: Secondary | ICD-10-CM

## 2023-09-09 LAB — POC FIT TEST STOOL: Fecal Occult Blood: NEGATIVE

## 2023-09-09 MED ORDER — SILDENAFIL CITRATE 100 MG PO TABS: 50.0000 mg | ORAL_TABLET | Freq: Every day | ORAL | 0 refills | Status: DC | PRN

## 2023-09-09 NOTE — Progress Notes (Signed)
BP 106/60   Pulse 87   Temp 98.4 F (36.9 C)   Ht 5\' 10"  (1.778 m)   Wt 204 lb 8 oz (92.8 kg)   SpO2 99%   BMI 29.34 kg/m    Subjective:    Patient ID: Edward Fisher, male    DOB: Sep 05, 1966, 57 y.o.   MRN: [id number]  HPI: Edward Fisher is a 57 y.o. male presenting on 09/09/2023 for Hypertension and Coronary Artery Disease   HPI   Chief Complaint  Patient presents with   Hypertension   Coronary Artery Disease    Pt says he had 1 episode of CP.  He did not take NTG.  He says he has never taken the ntg.   The cp only lasted 2 or 3 minutes and went away.  It came on while he was at rest with no associated symptoms.   Pt reports difficulty with achieving and maintaining erection and ejaculation.     Pt was seen by his neurologist in October.    Pt c/o having a "heel spur" on right foot.  He has been picking at it and even took the fingernail clippers to it.    Relevant past medical, surgical, family and social history reviewed and updated as indicated. Interim medical history since our last visit reviewed. Allergies and medications reviewed and updated.   Current Outpatient Medications:    aspirin 81 MG chewable tablet, Chew 81 mg by mouth every evening. TO START DOSAGE IN 6 MONTHS APPROXIMATELY July 2014, Disp: , Rfl:    atorvastatin (LIPITOR) 40 MG tablet, Take 1 tablet by mouth once daily, Disp: 30 tablet, Rfl: 3   carbidopa-levodopa (SINEMET IR) 25-100 MG tablet, Take 1 tablet by mouth 6 (six) times daily., Disp: , Rfl:    gabapentin (NEURONTIN) 300 MG capsule, Take 300 mg by mouth 6 (six) times daily., Disp: , Rfl:    metoprolol succinate (TOPROL-XL) 25 MG 24 hr tablet, Take 1 tablet by mouth once daily, Disp: 30 tablet, Rfl: 3   nitroGLYCERIN (NITROSTAT) 0.4 MG SL tablet, Place 1 tablet (0.4 mg total) under the tongue every 5 (five) minutes as needed for chest pain., Disp: 25 tablet, Rfl: PRN   omega-3 acid ethyl esters (LOVAZA) 1 G capsule, Take 1 g by mouth every  evening., Disp: , Rfl:    tamsulosin (FLOMAX) 0.4 MG CAPS capsule, Take 1 capsule by mouth once daily, Disp: 30 capsule, Rfl: 6    Review of Systems  Per HPI unless specifically indicated above     Objective:    BP 106/60   Pulse 87   Temp 98.4 F (36.9 C)   Ht 5\' 10"  (1.778 m)   Wt 204 lb 8 oz (92.8 kg)   SpO2 99%   BMI 29.34 kg/m   Wt Readings from Last 3 Encounters:  09/09/23 204 lb 8 oz (92.8 kg)  07/22/23 203 lb (92.1 kg)  07/16/23 203 lb (92.1 kg)    Physical Exam Vitals reviewed.  Constitutional:      General: He is not in acute distress.    Appearance: He is well-developed. He is not toxic-appearing.  HENT:     Head: Normocephalic and atraumatic.  Cardiovascular:     Rate and Rhythm: Normal rate and regular rhythm.  Pulmonary:     Effort: Pulmonary effort is normal.     Breath sounds: Normal breath sounds. No wheezing.  Abdominal:     General: Bowel sounds are normal.  Palpations: Abdomen is soft.     Tenderness: There is no abdominal tenderness.  Musculoskeletal:     Cervical back: Neck supple.     Right lower leg: No edema.     Left lower leg: No edema.  Lymphadenopathy:     Cervical: No cervical adenopathy.  Skin:    General: Skin is warm and dry.  Neurological:     Mental Status: He is alert and oriented to person, place, and time.     Motor: Weakness and tremor present.     Comments: Slight weakness and resting tremor RUE  Psychiatric:        Attention and Perception: Attention normal.        Behavior: Behavior normal. Behavior is cooperative.     Results for orders placed or performed during the hospital encounter of 09/05/23  Lipid panel  Result Value Ref Range   Cholesterol 101 0 - 200 mg/dL   Triglycerides 90 <540 mg/dL   HDL 29 (L) >98 mg/dL   Total CHOL/HDL Ratio 3.5 RATIO   VLDL 18 0 - 40 mg/dL   LDL Cholesterol 54 0 - 99 mg/dL  Comprehensive metabolic panel  Result Value Ref Range   Sodium 138 135 - 145 mmol/L   Potassium  4.4 3.5 - 5.1 mmol/L   Chloride 105 98 - 111 mmol/L   CO2 25 22 - 32 mmol/L   Glucose, Bld 117 (H) 70 - 99 mg/dL   BUN 16 6 - 20 mg/dL   Creatinine, Ser 1.19 0.61 - 1.24 mg/dL   Calcium 9.4 8.9 - 14.7 mg/dL   Total Protein 7.2 6.5 - 8.1 g/dL   Albumin 4.2 3.5 - 5.0 g/dL   AST 16 15 - 41 U/L   ALT 14 0 - 44 U/L   Alkaline Phosphatase 62 38 - 126 U/L   Total Bilirubin 0.7 0.3 - 1.2 mg/dL   GFR, Estimated >82 >95 mL/min   Anion gap 8 5 - 15      Assessment & Plan:   Encounter Diagnoses  Name Primary?   Arteriosclerotic cardiovascular disease (ASCVD) Yes   Bradykinesia    Corn of foot    Erectile dysfunction, unspecified erectile dysfunction type    COVID-19 vaccination declined      CAD -reviewed labs with pt -pt to continue with current medications -pt had normal lexiscan in June -pt to follow up with cardiology later this month as scheduled  bradykinesia -pt to continue with neurologist per his recommendations   Corn -discussed with pt that he had a corn, not a heel spur.  Recommended using otc medicated corn pads to gradually dissolve away the corn  ED -discussed with pt that viagra may work but that it is contra-indicated with nitrates.  Since he has never take the ntg and he has f/u with cardiology later this month, he is given printed rx and told to discuss with cardiologist.   If pt is taking the ntg or other nitrate, he will not be able to take the viagra.  He has never taken ntg and if he continues to not take the ntg, he can use viagra.  He states he knows he will need to discuss this with his cardiologist.    HCM -He got flu vax already -He declined covid vax  F/u 3 months.  RTO sooner prn

## 2023-09-09 NOTE — Patient Instructions (Signed)
Sildenafil Tablets (Erectile Dysfunction) What is this medication? SILDENAFIL (sil DEN a fil) treats erectile dysfunction (ED). It works by increasing blood flow to the penis, which helps to maintain an erection. This medicine may be used for other purposes; ask your health care provider or pharmacist if you have questions. COMMON BRAND NAME(S): Viagra What should I tell my care team before I take this medication? They need to know if you have any of these conditions: Abnormal penis shape or Peyronie disease Bleeding disorders Eye or vision loss problems Heart disease Low blood pressure History of blood diseases, such as sickle cell anemia or leukemia History of painful and prolonged erection Pulmonary veno-occlusive disease (PVOD) Stomach ulcers, other stomach or intestine problems An unusual or allergic reaction to sildenafil, other medications, foods, dyes, or preservatives Pregnant or trying to get pregnant Breastfeeding How should I use this medication? Take this medication by mouth with a glass of water. Follow the directions on the prescription label. The dose is usually taken 1 hour before sexual activity. You should not take the dose more than once per day. Do not take your medication more often than directed. Talk to your care team about the use of this medication in children. This medication is not used in children for this condition. Overdosage: If you think you have taken too much of this medicine contact a poison control center or emergency room at once. NOTE: This medicine is only for you. Do not share this medicine with others. What if I miss a dose? This does not apply. Do not take double or extra doses. What may interact with this medication? Do not take this medication with any of the following: Nitrates, such as amyl nitrite, isosorbide dinitrate, isosorbide mononitrate, nitroglycerin Riociguat Vericiguat This medication may also interact with the  following: Bosentan Certain medications for blood pressure Other medications may affect the way this medication works. Talk with your care team about all of the medications you take. They may suggest changes to your treatment plan to lower the risk of side effects and to make sure your medications work as intended. This list may not describe all possible interactions. Give your health care provider a list of all the medicines, herbs, non-prescription drugs, or dietary supplements you use. Also tell them if you smoke, drink alcohol, or use illegal drugs. Some items may interact with your medicine. What should I watch for while using this medication? Visit your care team for regular checks on your progress. Tell your care team if your symptoms do not start to get better or if they get worse. Tell your care team right away if you have any change in your eyesight or hearing. This medication may affect your coordination, reaction time, or judgment. Do not drive or operate machinery until you know how this medication affects you. Sit up or stand slowly to reduce the risk of dizzy or fainting spells. Drinking alcohol with this medication can increase the risk of these side effects. Contact your care team right away if you have an erection that lasts longer than 4 hours or if it becomes painful. This may be a sign of a serious problem and must be treated right away to prevent permanent damage. If you experience symptoms of nausea, dizziness, chest pain or arm pain upon initiation of sexual activity after taking this medication, you should refrain from further activity and call your care team as soon as possible. Using this medication does not protect you or your partner against HIV  or other sexually transmitted infections (STIs). What side effects may I notice from receiving this medication? Side effects that you should report to your care team as soon as possible: Allergic reactions--skin rash, itching,  hives, swelling of the face, lips, tongue, or throat Hearing loss or ringing in ears Heart attack--pain or tightness in the chest, shoulders, arms, or jaw, nausea, shortness of breath, cold or clammy skin, feeling faint or lightheaded Heart rhythm changes--fast or irregular heartbeat, dizziness, feeling faint or lightheaded, chest pain, trouble breathing Low blood pressure--dizziness, feeling faint or lightheaded, blurry vision New or worsening shortness of breath Prolonged or painful erection Stroke--sudden numbness or weakness of the face, arm, or leg, trouble speaking, confusion, trouble walking, loss of balance or coordination, dizziness, severe headache, change in vision Sudden vision loss in one or both eyes Side effects that usually do not require medical attention (report to your care team if they continue or are bothersome): Facial flushing or redness Headache Nosebleed Runny or stuffy nose Trouble sleeping Upset stomach This list may not describe all possible side effects. Call your doctor for medical advice about side effects. You may report side effects to FDA at 1-800-FDA-1088. Where should I keep my medication? Keep out of reach of children and pets. Store at room temperature between 15 and 30 degrees C (59 and 86 degrees F). Throw away any unused medication after the expiration date. NOTE: This sheet is a summary. It may not cover all possible information. If you have questions about this medicine, talk to your doctor, pharmacist, or health care provider.  2024 Elsevier/Gold Standard (2023-01-17 00:00:00)

## 2023-09-22 NOTE — Progress Notes (Unsigned)
  Cardiology Office Note:   Date:  09/24/2023  ID:  Edward Fisher, DOB [birth year], MRN [id number] PCP: Jacquelin Hawking, PA-C  [facility] Providers Cardiologist:  Rollene Rotunda, MD {  History of Present Illness:   Edward Fisher is a 57 y.o. male who presents for follow up of CAD (CABG 2013 LIMA-LAD, left radial-circumflex, SVG-PL-PDA).  At the last visit he was having some chest discomfort.  I sent him for stress perfusion study and this was low risk with an EF of 68%.  There was no evidence of ischemia or infarct.  Since that time he has done well.  He works as a Restaurant manager, fast food. The patient denies any new symptoms such as chest discomfort, neck or arm discomfort. There has been no new shortness of breath, PND or orthopnea. There have been no reported palpitations, presyncope or syncope.    ROS: As stated in the HPI and negative for all other systems.  Studies Reviewed:    EKG:   NA  Risk Assessment/Calculations:      Physical Exam:   VS:  BP 122/80   Pulse 60   Ht 5\' 10"  (1.778 m)   Wt 201 lb (91.2 kg)   BMI 28.84 kg/m    Wt Readings from Last 3 Encounters:  09/24/23 201 lb (91.2 kg)  09/09/23 204 lb 8 oz (92.8 kg)  07/22/23 203 lb (92.1 kg)     GEN: Well nourished, well developed in no acute distress NECK: No JVD; No carotid bruits CARDIAC: RRR, no murmurs, rubs, gallops RESPIRATORY:  Clear to auscultation without rales, wheezing or rhonchi  ABDOMEN: Soft, non-tender, non-distended EXTREMITIES:  No edema; No deformity   ASSESSMENT AND PLAN:   CAD in native artery:   The patient has no new sypmtoms.  No further cardiovascular testing is indicated.  We will continue with aggressive risk reduction and meds as listed.[name]   Dyslipidemia:    LDL was 54.  No change in therapy.     Follow up with me in 1 year  Signed, Rollene Rotunda, MD

## 2023-09-24 ENCOUNTER — Ambulatory Visit (INDEPENDENT_AMBULATORY_CARE_PROVIDER_SITE_OTHER): Payer: Self-pay | Admitting: Cardiology

## 2023-09-24 ENCOUNTER — Encounter: Payer: Self-pay | Admitting: Cardiology

## 2023-09-24 VITALS — BP 122/80 | HR 60 | Ht 70.0 in | Wt 201.0 lb

## 2023-09-24 DIAGNOSIS — I2510 Atherosclerotic heart disease of native coronary artery without angina pectoris: Secondary | ICD-10-CM

## 2023-09-24 DIAGNOSIS — E785 Hyperlipidemia, unspecified: Secondary | ICD-10-CM

## 2023-09-24 NOTE — Patient Instructions (Signed)

## 2023-10-04 ENCOUNTER — Other Ambulatory Visit: Payer: Self-pay | Admitting: Physician Assistant

## 2023-10-08 ENCOUNTER — Telehealth: Payer: Self-pay

## 2023-10-08 NOTE — Telephone Encounter (Addendum)
Called [facility] client to follow up. [facility] is [facility] and has all his medications. NO needs at this time. He states he was denied Medicaid due to income, however he reports that the company he works for has been bought out and not sure of his employment in the near future. Encouraged client to keep in touch and to let us know that outcome. Reminded him that we are here for him and reminded him also of our food market that is available to him as needed.  Will follow as needed.  Francee Nodal RN [employer]

## 2023-11-12 ENCOUNTER — Telehealth: Payer: Self-pay

## 2023-11-12 NOTE — Telephone Encounter (Signed)
 Patient identification verified by 2 forms. Edward Cooks, RN    Called and spoke to patient  Patient states:   -requested OV due to symptoms of chest pain   -chest pain is with activity, pain resolves after rest  -chest pain on going for 1 month    -when chest pain occurs has some SOB/difficulty breathing   -due to cardiac history would like appointment to review symptoms   -Takes metoprolol  25mg  daily   -has not needed to take NTG for pain  Patient denies:   -heart palpitations Advised patient to keep 1/9 OV with Dr. Lavona to follow up  Reviewed ED warning signs/precautions  Patient verbalized understanding

## 2023-11-12 NOTE — Progress Notes (Signed)
 Cardiology Office Note:   Date:  11/13/2023  ID:  Edward Fisher, DOB [birth year], MRN [id number] PCP: Comer Kirsch, PA-C  [facility] Providers Cardiologist:  Lynwood Schilling, MD {  History of Present Illness:   Edward Fisher is a 58 y.o. male  for follow up of CAD (CABG 2013 LIMA-LAD, left radial-circumflex, SVG-PL-PDA).  At the last visit he was having some chest discomfort.  I sent him for stress perfusion study and this was low risk with an EF of 68%.  There was no evidence of ischemia or infarct.   He was doing well when I saw him late in the fall.  However, now he has been having new onset exertional angina.  He says I would not be here something was not really bothering me.  He says at work he has been developing chest discomfort that is 5 out of 10 in intensity.  It is a dull mid aching.  It happens when he does some light work.  Takes several minutes to go away.  He is not describing jaw or arm discomfort.  He is not describing new shortness of breath and is not having any nausea or vomiting associated.  He is not having any excessive diaphoresis.  He denies palpitations, presyncope or syncope.  He does think it has been progressive over the month.  He is not sure whether it is reminiscent of his previous angina.  He thinks it is probably more intense but with less shortness of breath.   ROS: As stated in the HPI and negative for all other systems.  Studies Reviewed:    EKG:   EKG Interpretation Date/Time:  Thursday November 13 2023 07:55:14 EST Ventricular Rate:  69 PR Interval:  130 QRS Duration:  90 QT Interval:  400 QTC Calculation: 428 R Axis:   -7  Text Interpretation: Normal sinus rhythm ST & T wave abnormality, consider lateral ischemia When compared with ECG of 25-May-2020 08:35, No significant change since last tracing Confirmed by Schilling Lynwood ([id number]) on 11/13/2023 8:14:14 AM     Risk Assessment/Calculations:              Physical Exam:   VS:  BP  110/80 (BP Location: Right Arm, Patient Position: Sitting, Cuff Size: Normal)   Pulse 69   Ht 5' 10 (1.778 m)   Wt 203 lb (92.1 kg)   BMI 29.13 kg/m    Wt Readings from Last 3 Encounters:  11/13/23 203 lb (92.1 kg)  09/24/23 201 lb (91.2 kg)  09/09/23 204 lb 8 oz (92.8 kg)     GEN: Well nourished, well developed in no acute distress NECK: No JVD; No carotid bruits CARDIAC: RRR, no murmurs, rubs, gallops RESPIRATORY:  Clear to auscultation without rales, wheezing or rhonchi  ABDOMEN: Soft, non-tender, non-distended EXTREMITIES:  No edema; No deformity   ASSESSMENT AND PLAN:   CAD in native artery:   The patient is having new onset exertional angina (unstable.) given this cardiac catheterization is indicated.    The patient understands that risks included but are not limited to stroke (1 in 1000), death (1 in 1000), kidney failure [usually temporary] (1 in 500), bleeding (1 in 200), allergic reaction [possibly serious] (1 in 200).  The patient understands and agrees to proceed.  I will increase his metoprolol  to 25 mg daily.  Dyslipidemia:    LDL was 54.  Continue meds as listed.   Follow up with me after the cath.   Signed, Lynwood  Lavona, MD

## 2023-11-12 NOTE — H&P (View-Only) (Signed)
 Cardiology Office Note:   Date:  11/13/2023  ID:  Edward Fisher, DOB [birth year], MRN [id number] PCP: Comer Kirsch, PA-C  [facility] Providers Cardiologist:  Lynwood Schilling, MD {  History of Present Illness:   Edward Fisher is a 58 y.o. male  for follow up of CAD (CABG 2013 LIMA-LAD, left radial-circumflex, SVG-PL-PDA).  At the last visit he was having some chest discomfort.  I sent him for stress perfusion study and this was low risk with an EF of 68%.  There was no evidence of ischemia or infarct.   He was doing well when I saw him late in the fall.  However, now he has been having new onset exertional angina.  He says I would not be here something was not really bothering me.  He says at work he has been developing chest discomfort that is 5 out of 10 in intensity.  It is a dull mid aching.  It happens when he does some light work.  Takes several minutes to go away.  He is not describing jaw or arm discomfort.  He is not describing new shortness of breath and is not having any nausea or vomiting associated.  He is not having any excessive diaphoresis.  He denies palpitations, presyncope or syncope.  He does think it has been progressive over the month.  He is not sure whether it is reminiscent of his previous angina.  He thinks it is probably more intense but with less shortness of breath.   ROS: As stated in the HPI and negative for all other systems.  Studies Reviewed:    EKG:   EKG Interpretation Date/Time:  Thursday November 13 2023 07:55:14 EST Ventricular Rate:  69 PR Interval:  130 QRS Duration:  90 QT Interval:  400 QTC Calculation: 428 R Axis:   -7  Text Interpretation: Normal sinus rhythm ST & T wave abnormality, consider lateral ischemia When compared with ECG of 25-May-2020 08:35, No significant change since last tracing Confirmed by Schilling Lynwood ([id number]) on 11/13/2023 8:14:14 AM     Risk Assessment/Calculations:              Physical Exam:   VS:  BP  110/80 (BP Location: Right Arm, Patient Position: Sitting, Cuff Size: Normal)   Pulse 69   Ht 5' 10 (1.778 m)   Wt 203 lb (92.1 kg)   BMI 29.13 kg/m    Wt Readings from Last 3 Encounters:  11/13/23 203 lb (92.1 kg)  09/24/23 201 lb (91.2 kg)  09/09/23 204 lb 8 oz (92.8 kg)     GEN: Well nourished, well developed in no acute distress NECK: No JVD; No carotid bruits CARDIAC: RRR, no murmurs, rubs, gallops RESPIRATORY:  Clear to auscultation without rales, wheezing or rhonchi  ABDOMEN: Soft, non-tender, non-distended EXTREMITIES:  No edema; No deformity   ASSESSMENT AND PLAN:   CAD in native artery:   The patient is having new onset exertional angina (unstable.) given this cardiac catheterization is indicated.    The patient understands that risks included but are not limited to stroke (1 in 1000), death (1 in 1000), kidney failure [usually temporary] (1 in 500), bleeding (1 in 200), allergic reaction [possibly serious] (1 in 200).  The patient understands and agrees to proceed.  I will increase his metoprolol  to 25 mg daily.  Dyslipidemia:    LDL was 54.  Continue meds as listed.   Follow up with me after the cath.   Signed, Lynwood  Lavona, MD

## 2023-11-13 ENCOUNTER — Encounter: Payer: Self-pay | Admitting: Cardiology

## 2023-11-13 ENCOUNTER — Ambulatory Visit: Payer: Self-pay | Attending: Cardiology | Admitting: Cardiology

## 2023-11-13 VITALS — BP 110/80 | HR 69 | Ht 70.0 in | Wt 203.0 lb

## 2023-11-13 DIAGNOSIS — R079 Chest pain, unspecified: Secondary | ICD-10-CM

## 2023-11-13 DIAGNOSIS — I10 Essential (primary) hypertension: Secondary | ICD-10-CM

## 2023-11-13 DIAGNOSIS — I200 Unstable angina: Secondary | ICD-10-CM

## 2023-11-13 MED ORDER — METOPROLOL SUCCINATE ER 25 MG PO TB24: 25.0000 mg | ORAL_TABLET | Freq: Every day | ORAL | 3 refills | Status: DC

## 2023-11-13 NOTE — Patient Instructions (Signed)
 Medication Instructions:  Start taking one whole tablet of your metoprolol . *If you need a refill on your cardiac medications before your next appointment, please call your pharmacy*   Lab Work: BMET CBC with Diff today. If you have labs (blood work) drawn today and your tests are completely normal, you will receive your results only by: MyChart Message (if you have MyChart) OR A paper copy in the mail If you have any lab test that is abnormal or we need to change your treatment, we will call you to review the results.   Testing/Procedures:       Cardiac/Peripheral Catheterization   You are scheduled for a Cardiac Catheterization on Friday, January 10 with Dr. Lurena Red.  1. Please arrive at the [facility] (Main Entrance A) at [facility]: [address] [place], [place] [address] at 5:30 AM (This time is 2 hour(s) before your procedure to ensure your preparation).   Free valet parking service is available. You will check in at ADMITTING. The support person will be asked to wait in the waiting room.  It is OK to have someone drop you off and come back when you are ready to be discharged.        Special note: Every effort is made to have your procedure done on time. Please understand that emergencies sometimes delay scheduled procedures.  2. Diet: Do not eat solid foods after midnight.  You may have clear liquids until 5 AM the day of the procedure.  4. Medication instructions in preparation for your procedure:   Contrast Allergy: No      On the morning of your procedure, take Aspirin  81 mg and any morning medicines NOT listed above.  You may use sips of water.  5. Plan to go home the same day, you will only stay overnight if medically necessary. 6. You MUST have a responsible adult to drive you home. 7. An adult MUST be with you the first 24 hours after you arrive home. 8. Bring a current list of your medications, and the last time and date medication  taken. 9. Bring ID and current insurance cards. 10.Please wear clothes that are easy to get on and off and wear slip-on shoes.  Thank you for allowing us  to care for you!   -- [facility]    Follow-Up: At [facility], you and your health needs are our priority.  As part of our continuing mission to provide you with exceptional heart care, we have created designated Provider Care Teams.  These Care Teams include your primary Cardiologist (physician) and Advanced Practice Providers (APPs -  Physician Assistants and Nurse Practitioners) who all work together to provide you with the care you need, when you need it.  We recommend signing up for the patient portal called MyChart.  Sign up information is provided on this After Visit Summary.  MyChart is used to connect with patients for Virtual Visits (Telemedicine).  Patients are able to view lab/test results, encounter notes, upcoming appointments, etc.  Non-urgent messages can be sent to your provider as well.   To learn more about what you can do with MyChart, go to [email or web address].    Your next appointment:   1 month(s)  Provider:   First available. (Post cath follow up)

## 2023-11-14 ENCOUNTER — Other Ambulatory Visit: Payer: Self-pay

## 2023-11-14 ENCOUNTER — Ambulatory Visit (HOSPITAL_COMMUNITY)
Admission: RE | Disposition: A | Discharge status: Home / Self Care | Payer: Self-pay | Source: Home / Self Care | Attending: Internal Medicine

## 2023-11-14 ENCOUNTER — Ambulatory Visit (HOSPITAL_COMMUNITY)
Admission: RE | Admit: 2023-11-14 | Discharge: 2023-11-14 | Disposition: A | Discharge status: Home / Self Care | Payer: Self-pay | Attending: Internal Medicine | Admitting: Internal Medicine

## 2023-11-14 DIAGNOSIS — Z955 Presence of coronary angioplasty implant and graft: Secondary | ICD-10-CM | POA: Insufficient documentation

## 2023-11-14 DIAGNOSIS — E785 Hyperlipidemia, unspecified: Secondary | ICD-10-CM | POA: Insufficient documentation

## 2023-11-14 DIAGNOSIS — Y832 Surgical operation with anastomosis, bypass or graft as the cause of abnormal reaction of the patient, or of later complication, without mention of misadventure at the time of the procedure: Secondary | ICD-10-CM | POA: Insufficient documentation

## 2023-11-14 DIAGNOSIS — Z951 Presence of aortocoronary bypass graft: Secondary | ICD-10-CM | POA: Insufficient documentation

## 2023-11-14 DIAGNOSIS — T82898A Other specified complication of vascular prosthetic devices, implants and grafts, initial encounter: Secondary | ICD-10-CM | POA: Insufficient documentation

## 2023-11-14 DIAGNOSIS — I25119 Atherosclerotic heart disease of native coronary artery with unspecified angina pectoris: Secondary | ICD-10-CM

## 2023-11-14 DIAGNOSIS — I25700 Atherosclerosis of coronary artery bypass graft(s), unspecified, with unstable angina pectoris: Secondary | ICD-10-CM | POA: Insufficient documentation

## 2023-11-14 DIAGNOSIS — Z79899 Other long term (current) drug therapy: Secondary | ICD-10-CM | POA: Insufficient documentation

## 2023-11-14 DIAGNOSIS — I200 Unstable angina: Secondary | ICD-10-CM

## 2023-11-14 DIAGNOSIS — I2582 Chronic total occlusion of coronary artery: Secondary | ICD-10-CM | POA: Insufficient documentation

## 2023-11-14 HISTORY — PX: CORONARY PRESSURE/FFR STUDY: CATH118243

## 2023-11-14 HISTORY — PX: LEFT HEART CATH AND CORS/GRAFTS ANGIOGRAPHY: CATH118250

## 2023-11-14 LAB — CBC WITH DIFFERENTIAL/PLATELET
Basophils Absolute: 0.1 10*3/uL (ref 0.0–0.2)
Basos: 1 %
EOS (ABSOLUTE): 0.2 10*3/uL (ref 0.0–0.4)
Eos: 2 %
Hematocrit: 49.8 % (ref 37.5–51.0)
Hemoglobin: 15.8 g/dL (ref 13.0–17.7)
Immature Grans (Abs): 0 10*3/uL (ref 0.0–0.1)
Immature Granulocytes: 0 %
Lymphocytes Absolute: 2 10*3/uL (ref 0.7–3.1)
Lymphs: 24 %
MCH: 29.2 pg (ref 26.6–33.0)
MCHC: 31.7 g/dL (ref 31.5–35.7)
MCV: 92 fL (ref 79–97)
Monocytes Absolute: 0.6 10*3/uL (ref 0.1–0.9)
Monocytes: 7 %
Neutrophils Absolute: 5.4 10*3/uL (ref 1.4–7.0)
Neutrophils: 66 %
Platelets: 181 10*3/uL (ref 150–450)
RBC: 5.42 x10E6/uL (ref 4.14–5.80)
RDW: 12 % (ref 11.6–15.4)
WBC: 8.2 10*3/uL (ref 3.4–10.8)

## 2023-11-14 LAB — BASIC METABOLIC PANEL
BUN/Creatinine Ratio: 13 (ref 9–20)
BUN: 15 mg/dL (ref 6–24)
CO2: 23 mmol/L (ref 20–29)
Calcium: 10.3 mg/dL — ABNORMAL HIGH (ref 8.7–10.2)
Chloride: 105 mmol/L (ref 96–106)
Creatinine, Ser: 1.12 mg/dL (ref 0.76–1.27)
Glucose: 107 mg/dL — ABNORMAL HIGH (ref 70–99)
Potassium: 4.9 mmol/L (ref 3.5–5.2)
Sodium: 144 mmol/L (ref 134–144)
eGFR: 76 mL/min/{1.73_m2} (ref >59)

## 2023-11-14 SURGERY — LEFT HEART CATH AND CORS/GRAFTS ANGIOGRAPHY
Anesthesia: LOCAL

## 2023-11-14 MED ORDER — HEPARIN (PORCINE) IN NACL 1000-0.9 UT/500ML-% IV SOLN
INTRAVENOUS | Status: DC | PRN
  Administered 2023-11-14 (×2): 500 mL via INTRA_ARTERIAL

## 2023-11-14 MED ORDER — NITROGLYCERIN 1 MG/10 ML FOR IR/CATH LAB
INTRA_ARTERIAL | Status: DC | PRN
  Administered 2023-11-14: 150 ug via INTRACORONARY

## 2023-11-14 MED ORDER — HYDRALAZINE HCL 20 MG/ML IJ SOLN
10.0000 mg | INTRAMUSCULAR | Status: DC | PRN
Start: 2023-11-14 — End: 2023-11-14

## 2023-11-14 MED ORDER — CLOPIDOGREL BISULFATE 300 MG PO TABS
ORAL_TABLET | ORAL | Status: AC
Start: 2023-11-14 — End: ?
  Filled 2023-11-14: qty 2

## 2023-11-14 MED ORDER — MIDAZOLAM HCL 2 MG/2ML IJ SOLN
INTRAMUSCULAR | Status: DC | PRN
  Administered 2023-11-14: 1 mg via INTRAVENOUS

## 2023-11-14 MED ORDER — LIDOCAINE HCL (PF) 1 % IJ SOLN
INTRAMUSCULAR | Status: AC
  Filled 2023-11-14: qty 30

## 2023-11-14 MED ORDER — SODIUM CHLORIDE 0.9% FLUSH: 3.0000 mL | INTRAVENOUS | Status: DC | PRN

## 2023-11-14 MED ORDER — ASPIRIN 81 MG PO CHEW: 81.0000 mg | CHEWABLE_TABLET | ORAL | Status: DC

## 2023-11-14 MED ORDER — SODIUM CHLORIDE 0.9 % WEIGHT BASED INFUSION
1.0000 mL/kg/h | INTRAVENOUS | Status: DC
Start: 2023-11-14 — End: 2023-11-14

## 2023-11-14 MED ORDER — NITROGLYCERIN IN D5W 200-5 MCG/ML-% IV SOLN
INTRAVENOUS | Status: AC
  Filled 2023-11-14: qty 250

## 2023-11-14 MED ORDER — ISOSORBIDE MONONITRATE ER 30 MG PO TB24: 30.0000 mg | ORAL_TABLET | Freq: Every day | ORAL | 2 refills | Status: DC

## 2023-11-14 MED ORDER — SODIUM CHLORIDE 0.9 % IV SOLN: INTRAVENOUS | Status: DC

## 2023-11-14 MED ORDER — SODIUM CHLORIDE 0.9% FLUSH: 3.0000 mL | Freq: Two times a day (BID) | INTRAVENOUS | Status: DC

## 2023-11-14 MED ORDER — SODIUM CHLORIDE 0.9 % WEIGHT BASED INFUSION: 3.0000 mL/kg/h | INTRAVENOUS | Status: DC

## 2023-11-14 MED ORDER — LIDOCAINE HCL (PF) 1 % IJ SOLN
INTRAMUSCULAR | Status: DC | PRN
  Administered 2023-11-14: 10 mL

## 2023-11-14 MED ORDER — ACETAMINOPHEN 325 MG PO TABS: 650.0000 mg | ORAL_TABLET | ORAL | Status: DC | PRN

## 2023-11-14 MED ORDER — ONDANSETRON HCL 4 MG/2ML IJ SOLN: 4.0000 mg | Freq: Four times a day (QID) | INTRAMUSCULAR | Status: DC | PRN

## 2023-11-14 MED ORDER — LABETALOL HCL 5 MG/ML IV SOLN
10.0000 mg | INTRAVENOUS | Status: DC | PRN
Start: 2023-11-14 — End: 2023-11-14

## 2023-11-14 MED ORDER — FENTANYL CITRATE (PF) 100 MCG/2ML IJ SOLN
INTRAMUSCULAR | Status: DC | PRN
  Administered 2023-11-14: 25 ug via INTRAVENOUS

## 2023-11-14 MED ORDER — FENTANYL CITRATE (PF) 100 MCG/2ML IJ SOLN
INTRAMUSCULAR | Status: AC
  Filled 2023-11-14: qty 2

## 2023-11-14 MED ORDER — MIDAZOLAM HCL 2 MG/2ML IJ SOLN
INTRAMUSCULAR | Status: AC
  Filled 2023-11-14: qty 2

## 2023-11-14 MED ORDER — SODIUM CHLORIDE 0.9 % IV SOLN: 250.0000 mL | INTRAVENOUS | Status: DC | PRN

## 2023-11-14 SURGICAL SUPPLY — 21 items
CARD KEY FFR CATHWORX (MISCELLANEOUS) IMPLANT
CATH INFINITI 5 FR IM (CATHETERS) IMPLANT
CATH INFINITI 5FR MULTPACK ANG (CATHETERS) IMPLANT
CATH INFINITI JR4 5F (CATHETERS) IMPLANT
CATH LAUNCHER 6FR EBU3.5 (CATHETERS) IMPLANT
CLOSURE MYNX CONTROL 5F (Vascular Products) IMPLANT
FFR CATHWORX KEY CARD (MISCELLANEOUS) ×1
KIT ENCORE 26 ADVANTAGE (KITS) IMPLANT
KIT HEMO VALVE WATCHDOG (MISCELLANEOUS) IMPLANT
KIT SYRINGE INJ CVI SPIKEX1 (MISCELLANEOUS) IMPLANT
PACK CARDIAC CATHETERIZATION (CUSTOM PROCEDURE TRAY) ×1 IMPLANT
PROTECTION STATION PRESSURIZED (MISCELLANEOUS) ×1
SET ATX-X65L (MISCELLANEOUS) IMPLANT
SHEATH PINNACLE 5F 10CM (SHEATH) IMPLANT
SHEATH PINNACLE 6F 10CM (SHEATH) IMPLANT
SHEATH PROBE COVER 6X72 (BAG) IMPLANT
STATION PROTECTION PRESSURIZED (MISCELLANEOUS) IMPLANT
TUBING CIL FLEX 10 FLL-RA (TUBING) IMPLANT
WIRE EMERALD 3MM-J .035X150CM (WIRE) IMPLANT
WIRE EMERALD 3MM-J .035X260CM (WIRE) IMPLANT
WIRE MICRO SET SILHO 5FR 7 (SHEATH) IMPLANT

## 2023-11-14 NOTE — Interval H&P Note (Signed)
 History and Physical Interval Note:  11/14/2023 6:45 AM  Edward Fisher  has presented today for surgery, with the diagnosis of unstable angina.  The various methods of treatment have been discussed with the patient and family. After consideration of risks, benefits and other options for treatment, the patient has consented to  Procedure(s): LEFT HEART CATH AND CORS/GRAFTS ANGIOGRAPHY (N/A) as a surgical intervention.  The patient's history has been reviewed, patient examined, no change in status, stable for surgery.  I have reviewed the patient's chart and labs.  Questions were answered to the patient's satisfaction.     Shamariah Shewmake K Samina Weekes

## 2023-11-14 NOTE — Discharge Instructions (Signed)
 Femoral Site Care This sheet gives you information about how to care for yourself after your procedure. Your health care provider may also give you more specific instructions. If you have problems or questions, contact your health care provider. What can I expect after the procedure?  After the procedure, it is common to have: Bruising that usually fades within 1-2 weeks. Tenderness at the site. Follow these instructions at home: Wound care Follow instructions from your health care provider about how to take care of your insertion site. Make sure you: Wash your hands with soap and water  before you change your bandage (dressing). If soap and water  are not available, use hand sanitizer. Remove your dressing as told by your health care provider. 24 hours Do not take baths, swim, or use a hot tub until your health care provider approves. You may shower 24-48 hours after the procedure or as told by your health care provider. Gently wash the site with plain soap and water . Pat the area dry with a clean towel. Do not rub the site. This may cause bleeding. Do not apply powder or lotion to the site. Keep the site clean and dry. Check your femoral site every day for signs of infection. Check for: Redness, swelling, or pain. Fluid or blood. Warmth. Pus or a bad smell. Activity For the first 2-3 days after your procedure, or as long as directed: Avoid climbing stairs as much as possible. Do not squat. Do not lift anything that is heavier than 10 lb (4.5 kg), or the limit that you are told, until your health care provider says that it is safe. For 5 days Rest as directed. Avoid sitting for a long time without moving. Get up to take short walks every 1-2 hours. Do not drive for 24 hours if you were given a medicine to help you relax (sedative). General instructions Take over-the-counter and prescription medicines only as told by your health care provider. Keep all follow-up visits as told by your  health care provider. This is important. Contact a health care provider if you have: A fever or chills. You have redness, swelling, or pain around your insertion site. Get help right away if: The catheter insertion area swells very fast. You pass out. You suddenly start to sweat or your skin gets clammy. The catheter insertion area is bleeding, and the bleeding does not stop when you hold steady pressure on the area. The area near or just beyond the catheter insertion site becomes pale, cool, tingly, or numb. These symptoms may represent a serious problem that is an emergency. Do not wait to see if the symptoms will go away. Get medical help right away. Call your local emergency services (911 in the [place] Do not drive yourself to the hospital. Summary After the procedure, it is common to have bruising that usually fades within 1-2 weeks. Check your femoral site every day for signs of infection. Do not lift anything that is heavier than 10 lb (4.5 kg), or the limit that you are told, until your health care provider says that it is safe. This information is not intended to replace advice given to you by your health care provider. Make sure you discuss any questions you have with your health care provider. Document Revised: 11/03/2017 Document Reviewed: 11/03/2017 Elsevier Patient Education  2020 [employer].

## 2023-11-17 ENCOUNTER — Encounter (HOSPITAL_COMMUNITY): Payer: Self-pay | Admitting: Internal Medicine

## 2023-11-17 MED FILL — Nitroglycerin IV Soln 200 MCG/ML in D5W: INTRAVENOUS | Qty: 250 | Status: AC

## 2023-11-18 ENCOUNTER — Telehealth: Payer: Self-pay

## 2023-11-18 NOTE — Progress Notes (Signed)
Cardiology Clinic Note   Patient Name: Edward Fisher Date of Encounter: 11/21/2023  Primary Care Provider:  Jacquelin Hawking, PA-C Primary Cardiologist:  Rollene Rotunda, MD  Patient Profile    Edward Fisher 58 year old male presents the clinic today for a follow-up evaluation of his coronary artery disease.  Past Medical History    Past Medical History:  Diagnosis Date   Arteriosclerotic cardiovascular disease (ASCVD)    Onset in 04/2012; CABG in 06/2012 w/ LIMA-LAD, L Rad-CFX, SVG-PL-PDA   Hyperlipidemia    goal LDL < 70   Insomnia    Nephrolithiasis    Past Surgical History:  Procedure Laterality Date   CORONARY ARTERY BYPASS GRAFT  06/24/2012   Procedure: CORONARY ARTERY BYPASS GRAFTING (CABG);  Surgeon: Delight Ovens, MD;  Location: [facility] OR;  Service: Open Heart Surgery;  Laterality: N/A;  Coronary Artery  Bypass X 4 utilizing endoscopic saphenous vein graft and  Left radial Artery Harvest    CORONARY PRESSURE/FFR STUDY N/A 11/14/2023   Procedure: CORONARY PRESSURE/FFR STUDY;  Surgeon: Orbie Pyo, MD;  Location: [facility] INVASIVE CV LAB;  Service: Cardiovascular;  Laterality: N/A;   CYSTOSCOPY W/ RETROGRADES Left 08/23/2015   Procedure: CYSTOSCOPY WITH RETROGRADE PYELOGRAM;  Surgeon: Malen Gauze, MD;  Location: [facility] ORS;  Service: Urology;  Laterality: Left;   CYSTOSCOPY WITH URETEROSCOPY, STONE BASKETRY AND STENT PLACEMENT Left 08/23/2015   Procedure: CYSTOSCOPY WITH URETEROSCOPY, STONE BASKET EXTRACTION;  Surgeon: Malen Gauze, MD;  Location: [facility] ORS;  Service: Urology;  Laterality: Left;   HOLMIUM LASER APPLICATION Left 08/23/2015   Procedure: HOLMIUM LASER APPLICATION;  Surgeon: Malen Gauze, MD;  Location: [facility] ORS;  Service: Urology;  Laterality: Left;   LEFT HEART CATH AND CORS/GRAFTS ANGIOGRAPHY N/A 11/14/2023   Procedure: LEFT HEART CATH AND CORS/GRAFTS ANGIOGRAPHY;  Surgeon: Orbie Pyo, MD;  Location: [facility] CV LAB;  Service:  Cardiovascular;  Laterality: N/A;   LEFT HEART CATHETERIZATION WITH CORONARY ANGIOGRAM N/A 06/23/2012   Procedure: LEFT HEART CATHETERIZATION WITH CORONARY ANGIOGRAM;  Surgeon: [name], MD;  Location: [facility] CATH LAB;  Service: Cardiovascular;  Laterality: N/A;   RHINOPLASTY     TONSILLECTOMY  ~1971    Allergies  Allergies  Allergen Reactions   Prozac [Fluoxetine] Other (See Comments)    "made me shake" intolerance.    Pollen Extract Cough    History of Present Illness    Edward Fisher has a PMH of coronary artery disease status post CABG-2013, hyperlipidemia, HTN, unstable angina.  He underwent nuclear stress test which showed low risk and EF of 68%.  No evidence of ischemia or infarct were noted.  He followed up with Dr. Antoine Poche 11/13/2023.  During that time he reported new onset exertional angina.  He reported that he was concerned that something was really bothering him.  He reported at work he would develop chest discomfort with 5 out of 10 intensity.  He described as a dull mid ache.  The symptoms were occurring with light work.  They would take several minutes to go away.  He did not describe jaw or arm discomfort.  He denied new shortness of breath, nausea, and vomiting.  He denied palpitations, presyncope and syncope.  He did note that his symptoms have been progressing over the past month.  He was not sure if his symptoms were similar to his previous angina.  He underwent left heart cath on 11/14/2023.  He was noted to have patent LIMA-LAD, distal LAD  with moderate stenosis which was not amenable to PCI.  Occluded vein graft to PLV and PDA.  He was also noted to have moderate left main disease.  FFR was negative at 0.98.  He was also noted to have an occluded mid right coronary artery.  He was not a candidate for CTO PCI based on his angiography.  Medical management was recommended.  He presents to the clinic today for follow-up evaluation and states he has returned to work.  He  works Clinical biochemist.  He initially noted headaches with starting Imdur.  He reports that after 3 to 4 days they have gone away.  He denies headache this morning.  He reports that he has been taking the medication in the evening and was waking up with headaches in the morning.  He has not had any further episodes of chest discomfort.  He feels the medication is working well.  He does feel that it may start to wear off through the day.  However, he does feel his breathing has improved.  We reviewed his LHC and he expressed understanding.  I will have him take his Imdur in the morning and plan follow-up in 3 to 4 months..  Today he denies chest pain, shortness of breath, lower extremity edema, fatigue, palpitations, melena, hematuria, hemoptysis, diaphoresis, weakness, presyncope, syncope, orthopnea, and PND.   Home Medications    Prior to Admission medications   Medication Sig Start Date End Date Taking? Authorizing Provider  aspirin 81 MG chewable tablet Chew 81 mg by mouth every evening. TO START DOSAGE IN 6 MONTHS APPROXIMATELY July 2014    [provider]  atorvastatin (LIPITOR) 40 MG tablet Take 1 tablet by mouth once daily 10/06/23   Jacquelin Hawking, PA-C  carbidopa-levodopa (SINEMET IR) 25-100 MG tablet Take 1-2 tablets by mouth See admin instructions. Take 1 tablet in the morning and lunch, Then 2 tables at 1600 and 1 tablet at 2000 and 1 tablet at bedtime    [provider]  gabapentin (NEURONTIN) 300 MG capsule Take 600 mg by mouth 3 (three) times daily.    [provider]  isosorbide mononitrate (IMDUR) 30 MG 24 hr tablet Take 1 tablet (30 mg total) by mouth daily. 11/14/23 11/13/24  Jonita Albee, PA-C  metoprolol succinate (TOPROL-XL) 25 MG 24 hr tablet Take 1 tablet (25 mg total) by mouth daily. 11/13/23   Rollene Rotunda, MD  nitroGLYCERIN (NITROSTAT) 0.4 MG SL tablet Place 1 tablet (0.4 mg total) under the tongue every 5 (five) minutes as needed for  chest pain. 03/18/23   Jacquelin Hawking, PA-C  Omega-3 Fatty Acids (FISH OIL) 1000 MG CAPS Take 1,000 mg by mouth daily.    [provider]  tamsulosin (FLOMAX) 0.4 MG CAPS capsule Take 1 capsule by mouth once daily 08/14/23   Jacquelin Hawking, PA-C    Family History    Family History  Problem Relation Age of Onset   Hypertension Mother    Ulcers Father        Peptic ulcer disease   Coronary artery disease Father        Stents   Coronary artery disease Brother        CABG   He indicated that his mother is alive. He indicated that his father is alive. He indicated that the status of his brother is unknown.  Social History    Social History   Socioeconomic History   Marital status: Widowed    Spouse name: Not on  file   Number of children: Not on file   Years of education: Not on file   Highest education level: Not on file  Occupational History   Occupation: Therapist, sports: [employer]    Comment: self-employed  Tobacco Use   Smoking status: Never   Smokeless tobacco: Former    Types: Chew    Quit date: 1997   Tobacco comments:    06/22/2012 "quit chewing 10-15 years ago"  Vaping Use   Vaping status: Never Used  Substance and Sexual Activity   Alcohol use: No    Alcohol/week: 0.0 standard drinks of alcohol   Drug use: No   Sexual activity: Yes  Other Topics Concern   Not on file  Social History Narrative   Regular exercise: No   Social Drivers of Corporate investment banker Strain: Low Risk  (12/19/2022)   Received from [facility], [facility]   Overall Financial Resource Strain (CARDIA)    Difficulty of Paying Living Expenses: Not hard at all  Food Insecurity: No Food Insecurity (12/19/2022)   Received from [facility], [facility]   Hunger Vital Sign    Worried About Running Out of Food in the Last Year: Never true    Ran Out of Food in the Last Year: Never true  Transportation Needs: No Transportation Needs  (12/19/2022)   Received from [facility], [facility]   PRAPARE - Transportation    Lack of Transportation (Medical): No    Lack of Transportation (Non-Medical): No  Physical Activity: Not on file  Stress: Not on file  Social Connections: Unknown (03/19/2022)   Received from [facility], [facility]   Social Network    Social Network: Not on file  Intimate Partner Violence: Unknown (02/08/2022)   Received from [facility], [facility]   HITS    Physically Hurt: Not on file    Insult or Talk Down To: Not on file    Threaten Physical Harm: Not on file    Scream or Curse: Not on file     Review of Systems    General:  No chills, fever, night sweats or weight changes.  Cardiovascular:  No chest pain, dyspnea on exertion, edema, orthopnea, palpitations, paroxysmal nocturnal dyspnea. Dermatological: No rash, lesions/masses Respiratory: No cough, dyspnea Urologic: No hematuria, dysuria Abdominal:   No nausea, vomiting, diarrhea, bright red blood per rectum, melena, or hematemesis Neurologic:  No visual changes, wkns, changes in mental status. All other systems reviewed and are otherwise negative except as noted above.  Physical Exam    VS:  BP 106/76 (BP Location: Left Arm, Patient Position: Sitting, Cuff Size: Large)   Pulse (!) 55   Ht 5\' 10"  (1.778 m)   Wt 202 lb 12.8 oz (92 kg)   SpO2 99%   BMI 29.10 kg/m  , BMI Body mass index is 29.1 kg/m. GEN: Well nourished, well developed, in no acute distress. HEENT: normal. Neck: Supple, no JVD, carotid bruits, or masses. Cardiac: RRR, no murmurs, rubs, or gallops. No clubbing, cyanosis, edema.  Radials/DP/PT 2+ and equal bilaterally.  Respiratory:  Respirations regular and unlabored, clear to auscultation bilaterally. GI: Soft, nontender, nondistended, BS + x 4. MS: no deformity or atrophy. Skin: warm and dry, no rash. Neuro:  Strength and sensation are intact. Psych: Normal affect.  Accessory Clinical Findings     Recent Labs: 09/05/2023: ALT 14 11/13/2023: BUN 15; Creatinine, Ser 1.12; Hemoglobin 15.8; Platelets 181; Potassium 4.9; Sodium  144   Recent Lipid Panel    Component Value Date/Time   CHOL 101 09/05/2023 0841   TRIG 90 09/05/2023 0841   HDL 29 (L) 09/05/2023 0841   CHOLHDL 3.5 09/05/2023 0841   VLDL 18 09/05/2023 0841   LDLCALC 54 09/05/2023 0841         ECG personally reviewed by me today-none today.      Cardiac catheterization 11/14/2023    Origin lesion is 100% stenosed.   Prox RCA to Mid RCA lesion is 100% stenosed.   Dist LM lesion is 50% stenosed.   Ost Cx to Prox Cx lesion is 70% stenosed.   2nd Mrg lesion is 80% stenosed.   Dist LAD lesion is 60% stenosed.   1.  Patent LIMA to LAD.  The distal LAD has moderate disease but this is a small vessel and should be treated medically. 2.  Occluded vein graft to PLV and PDA. 3.  Moderate left main disease which does perfuse an on graft to diagonal; FFR angiography was negative at 0.98 4.  Occluded native mid right coronary artery which does not seem to be a candidate for CTO PCI based on angiographic appearance. 4.  LVEDP of 19 mmHg.   Recommendation: Medical therapy.  New exertional angina may be from subacute occlusion of vein graft to RPLV and PDA.    Diagnostic Dominance: Right  Intervention    Assessment & Plan   1.  Coronary artery disease-no chest pain today.  Denies chest discomfort since being discharged from the hospital.  Cardiac catheterization 11/14/2023 showed distal LAD with moderate stenosis and moderate left main disease.  Details above.  Medical management was recommended.  I changed Imdur dosing to a.m. Continue Imdur, metoprolol, atorvastatin, aspirin Heart healthy low-sodium diet Increase physical activity as tolerated  Hyperlipidemia-LDL 54. High-fiber diet-high-fiber diet sheet given Continue aspirin, atorvastatin  Essential hypertension-BP today 106/70. Maintain blood pressure  log Continue Imdur, metoprolol  Disposition: Follow-up with Dr. Antoine Poche in 3-4 months.   Thomasene Ripple. Iman Reinertsen NP-C     11/21/2023, 10:47 AM [facility] [address] Office [phone] Fax [phone]    I spent 14 minutes examining this patient, reviewing medications, and using patient centered shared decision making involving their cardiac care.   I spent greater than 20 minutes reviewing their past medical history,  medications, and prior cardiac tests.

## 2023-11-18 NOTE — Telephone Encounter (Signed)
 Attempted call to follow up with [facility] client and wellness call, no answer, left message requesting return call.  Noted client recently had a cardiac cath. Has appointments [facility].   Avelina JONELLE Skeen RN [employer]

## 2023-11-21 ENCOUNTER — Ambulatory Visit: Payer: Self-pay | Attending: General Practice | Admitting: General Practice

## 2023-11-21 ENCOUNTER — Encounter: Payer: Self-pay | Admitting: General Practice

## 2023-11-21 VITALS — BP 106/76 | HR 55 | Ht 70.0 in | Wt 202.8 lb

## 2023-11-21 DIAGNOSIS — I2510 Atherosclerotic heart disease of native coronary artery without angina pectoris: Secondary | ICD-10-CM

## 2023-11-21 DIAGNOSIS — I10 Essential (primary) hypertension: Secondary | ICD-10-CM

## 2023-11-21 DIAGNOSIS — E782 Mixed hyperlipidemia: Secondary | ICD-10-CM

## 2023-11-21 NOTE — Patient Instructions (Signed)
Medication Instructions:  START TAKING YOUR IMDUR IN THE AM *If you need a refill on your cardiac medications before your next appointment, please call your pharmacy*  Lab Work: NONE  Other Instructions FOLLOW INCREASE FIBER DIET ATTACHED NO ACTIVITY RESTRICTIONS  Follow-Up: At [facility], you and your health needs are our priority.  As part of our continuing mission to provide you with exceptional heart care, we have created designated Provider Care Teams.  These Care Teams include your primary Cardiologist (physician) and Advanced Practice Providers (APPs -  Physician Assistants and Nurse Practitioners) who all work together to provide you with the care you need, when you need it.  Your next appointment:   3-4 month(s)  Provider:   Rollene Rotunda, MD  or Edd Fabian, FNP         High-Fiber Eating Plan Fiber, also called dietary fiber, is found in foods such as fruits, vegetables, whole grains, and beans. A high-fiber diet can be good for your health. Your health care provider may recommend a high-fiber diet to help: Prevent trouble pooping (constipation). Lower your cholesterol. Treat the following conditions: Hemorrhoids. This is inflammation of veins in the anus. Inflammation of specific areas of the digestive tract. Irritable bowel syndrome (IBS). This is a problem of the large intestine, also called the colon, that sometimes causes belly pain and bloating. Prevent overeating as part of a weight-loss plan. Lower the risk of heart disease, type 2 diabetes, and certain cancers. What are tips for following this plan? Reading food labels  Check the nutrition facts label on foods for the amount of dietary fiber. Choose foods that have 4 grams of fiber or more per serving. The recommended goals for how much fiber you should eat each day include: Males 34 years old or younger: 30-34 g. Males over 43 years old: 28-34 g. Females 66 years old or younger: 25-28  g. Females over 44 years old: 22-25 g. Your daily fiber goal is _____________ g. Shopping Choose whole fruits and vegetables instead of processed. For example, choose apples instead of apple juice or applesauce. Choose a variety of high-fiber foods such as avocados, lentils, oats, and pinto beans. Read the nutrition facts label on foods. Check for foods with added fiber. These foods often have high sugar and salt (sodium) amounts per serving. Cooking Use whole-grain flour for baking and cooking. Cook with brown rice instead of white rice. Make meals that have a lot of beans and vegetables in them, such as chili or vegetable-based soups. Meal planning Start the day with a breakfast that is high in fiber, such as a cereal that has 5 g of fiber or more per serving. Eat breads and cereals that are made with whole-grain flour instead of refined flour or white flour. Eat brown rice, bulgur wheat, or millet instead of white rice. Use beans in place of meat in soups, salads, and pasta dishes. Be sure that half of the grains you eat each day are whole grains. General information You can get the recommended amount of dietary fiber by: Eating a variety of fruits, vegetables, grains, nuts, and beans. Taking a fiber supplement if you aren't able to eat enough fiber. It's better to get fiber through food than from a supplement. Slowly increase how much fiber you eat. If you increase the amount of fiber you eat too quickly, you may have bloating, cramping, or gas. Drink plenty of water to help you digest fiber. Choose high-fiber snacks, such as berries, raw vegetables,  nuts, and popcorn. What foods should I eat? Fruits Berries. Pears. Apples. Oranges. Avocado. Prunes and raisins. Dried figs. Vegetables Sweet potatoes. Spinach. Kale. Artichokes. Cabbage. Broccoli. Cauliflower. Green peas. Carrots. Squash. Grains Whole-grain breads. Multigrain cereal. Oats and oatmeal. Brown rice. Barley. Bulgur wheat.  Millet. Quinoa. Bran muffins. Popcorn. Rye wafer crackers. Meats and other proteins Navy beans, kidney beans, and pinto beans. Soybeans. Split peas. Lentils. Nuts and seeds. Dairy Fiber-fortified yogurt. Fortified means that fiber has been added to the product. Beverages Fiber-fortified soy milk. Fiber-fortified orange juice. Other foods Fiber bars. The items listed above may not be all the foods and drinks you can have. Talk to a dietitian to learn more. What foods should I avoid? Fruits Fruit juice. Cooked, strained fruit. Vegetables Fried potatoes. Canned vegetables. Well-cooked vegetables. Grains White bread. Pasta made with refined flour. White rice. Meats and other proteins Fatty meat. Fried chicken or fried fish. Dairy Milk. Cream cheese. Sour cream. Fats and oils Butters. Beverages Soft drinks. Other foods Cakes and pastries. The items listed above may not be all the foods and drinks you should avoid. Talk to a dietitian to learn more. This information is not intended to replace advice given to you by your health care provider. Make sure you discuss any questions you have with your health care provider. Document Revised: 01/13/2023 Document Reviewed: 01/13/2023 Elsevier Patient Education  2024 [employer].

## 2023-11-25 ENCOUNTER — Other Ambulatory Visit: Payer: Self-pay | Admitting: Physician Assistant

## 2023-11-25 DIAGNOSIS — R7309 Other abnormal glucose: Secondary | ICD-10-CM

## 2023-11-25 DIAGNOSIS — I2510 Atherosclerotic heart disease of native coronary artery without angina pectoris: Secondary | ICD-10-CM

## 2023-11-25 DIAGNOSIS — I10 Essential (primary) hypertension: Secondary | ICD-10-CM

## 2023-11-25 DIAGNOSIS — Z131 Encounter for screening for diabetes mellitus: Secondary | ICD-10-CM

## 2023-11-25 DIAGNOSIS — Z125 Encounter for screening for malignant neoplasm of prostate: Secondary | ICD-10-CM

## 2023-11-25 DIAGNOSIS — Z87898 Personal history of other specified conditions: Secondary | ICD-10-CM

## 2023-11-25 DIAGNOSIS — E785 Hyperlipidemia, unspecified: Secondary | ICD-10-CM

## 2023-12-04 ENCOUNTER — Other Ambulatory Visit (HOSPITAL_COMMUNITY)
Admission: RE | Admit: 2023-12-04 | Discharge: 2023-12-04 | Disposition: A | Discharge status: Home / Self Care | Payer: Self-pay | Source: Ambulatory Visit | Attending: Physician Assistant | Admitting: Physician Assistant

## 2023-12-04 DIAGNOSIS — I10 Essential (primary) hypertension: Secondary | ICD-10-CM | POA: Insufficient documentation

## 2023-12-04 DIAGNOSIS — Z131 Encounter for screening for diabetes mellitus: Secondary | ICD-10-CM | POA: Insufficient documentation

## 2023-12-04 DIAGNOSIS — E785 Hyperlipidemia, unspecified: Secondary | ICD-10-CM | POA: Insufficient documentation

## 2023-12-04 DIAGNOSIS — R7309 Other abnormal glucose: Secondary | ICD-10-CM | POA: Insufficient documentation

## 2023-12-04 DIAGNOSIS — I2510 Atherosclerotic heart disease of native coronary artery without angina pectoris: Secondary | ICD-10-CM | POA: Insufficient documentation

## 2023-12-04 DIAGNOSIS — Z125 Encounter for screening for malignant neoplasm of prostate: Secondary | ICD-10-CM | POA: Insufficient documentation

## 2023-12-04 DIAGNOSIS — Z87898 Personal history of other specified conditions: Secondary | ICD-10-CM | POA: Insufficient documentation

## 2023-12-04 LAB — COMPREHENSIVE METABOLIC PANEL
ALT: 15 U/L (ref 0–44)
AST: 14 U/L — ABNORMAL LOW (ref 15–41)
Albumin: 4.1 g/dL (ref 3.5–5.0)
Alkaline Phosphatase: 67 U/L (ref 38–126)
Anion gap: 8 (ref 5–15)
BUN: 18 mg/dL (ref 6–20)
CO2: 26 mmol/L (ref 22–32)
Calcium: 9.6 mg/dL (ref 8.9–10.3)
Chloride: 107 mmol/L (ref 98–111)
Creatinine, Ser: 1.01 mg/dL (ref 0.61–1.24)
GFR, Estimated: >60 mL/min (ref >60)
Glucose, Bld: 118 mg/dL — ABNORMAL HIGH (ref 70–99)
Potassium: 4.6 mmol/L (ref 3.5–5.1)
Sodium: 141 mmol/L (ref 135–145)
Total Bilirubin: 0.7 mg/dL (ref 0.0–1.2)
Total Protein: 7.3 g/dL (ref 6.5–8.1)

## 2023-12-04 LAB — LIPID PANEL
Cholesterol: 82 mg/dL (ref 0–200)
HDL: 29 mg/dL — ABNORMAL LOW (ref >40)
LDL Cholesterol: 32 mg/dL (ref 0–99)
Total CHOL/HDL Ratio: 2.8 {ratio}
Triglycerides: 103 mg/dL (ref <150)
VLDL: 21 mg/dL (ref 0–40)

## 2023-12-05 LAB — PSA: Prostatic Specific Antigen: 1.52 ng/mL (ref 0.00–4.00)

## 2023-12-05 LAB — HEMOGLOBIN A1C
Hgb A1c MFr Bld: 5.6 % (ref 4.8–5.6)
Mean Plasma Glucose: 114 mg/dL

## 2023-12-08 ENCOUNTER — Ambulatory Visit: Payer: Self-pay | Admitting: Physician Assistant

## 2023-12-08 ENCOUNTER — Encounter: Payer: Self-pay | Admitting: Physician Assistant

## 2023-12-08 VITALS — BP 103/59 | HR 74 | Temp 97.6°F

## 2023-12-08 DIAGNOSIS — R258 Other abnormal involuntary movements: Secondary | ICD-10-CM

## 2023-12-08 DIAGNOSIS — I2510 Atherosclerotic heart disease of native coronary artery without angina pectoris: Secondary | ICD-10-CM

## 2023-12-08 DIAGNOSIS — M79602 Pain in left arm: Secondary | ICD-10-CM

## 2023-12-08 NOTE — Progress Notes (Signed)
BP (!) 103/59   Pulse 74   Temp 97.6 F (36.4 C)   SpO2 99%    Subjective:    Patient ID: Edward Fisher, male    DOB: [birth year], 58 y.o.   MRN: [id number]  HPI: NISAIAH Fisher is a 58 y.o. male presenting on 12/08/2023 for Coronary Artery Disease   HPI  Chief Complaint  Patient presents with   Coronary Artery Disease     Pt no longer working.  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 this is his 3rd week of not working.    He says he has No anxiety / depression.  He has not increased his activity as recommended by cardiology.  He says his CP has improved with addition of imdur.  He had LHC in January and has f/u with cardiology in March.  Pt c/o Left arm pain for 2-3 months.  He says the pain isn't constant.  Sometimes it is brought on by using the arm.   Pain is mostly in the forearm but is sometimes in other areas.    Relevant past medical, surgical, family and social history reviewed and updated as indicated. Interim medical history since our last visit reviewed. Allergies and medications reviewed and updated.   Current Outpatient Medications:    aspirin 81 MG chewable tablet, Chew 81 mg by mouth every evening. TO START DOSAGE IN 6 MONTHS APPROXIMATELY July 2014, Disp: , Rfl:    atorvastatin (LIPITOR) 40 MG tablet, Take 1 tablet by mouth once daily, Disp: 30 tablet, Rfl: 3   carbidopa-levodopa (SINEMET IR) 25-100 MG tablet, Take 1-2 tablets by mouth See admin instructions. Take 1 tablet in the morning and lunch, Then 2 tables at 1600 and 1 tablet at 2000 and 1 tablet at bedtime, Disp: , Rfl:    gabapentin (NEURONTIN) 300 MG capsule, Take 600 mg by mouth 3 (three) times daily., Disp: , Rfl:    isosorbide mononitrate (IMDUR) 30 MG 24 hr tablet, Take 1 tablet (30 mg total) by mouth daily., Disp: 90 tablet, Rfl: 2   metoprolol succinate (TOPROL-XL) 25 MG 24 hr tablet, Take 1 tablet (25 mg total) by mouth daily., Disp: 30 tablet,  Rfl: 3   nitroGLYCERIN (NITROSTAT) 0.4 MG SL tablet, Place 1 tablet (0.4 mg total) under the tongue every 5 (five) minutes as needed for chest pain., Disp: 25 tablet, Rfl: PRN   Omega-3 Fatty Acids (FISH OIL) 1000 MG CAPS, Take 1,000 mg by mouth daily., Disp: , Rfl:    tamsulosin (FLOMAX) 0.4 MG CAPS capsule, Take 1 capsule by mouth once daily, Disp: 30 capsule, Rfl: 6   Review of Systems  Per HPI unless specifically indicated above     Objective:    BP (!) 103/59   Pulse 74   Temp 97.6 F (36.4 C)   SpO2 99%   Wt Readings from Last 3 Encounters:  11/21/23 202 lb 12.8 oz (92 kg)  11/14/23 203 lb (92.1 kg)  11/13/23 203 lb (92.1 kg)    Physical Exam Vitals reviewed.  Constitutional:      General: He is not in acute distress.    Appearance: He is well-developed. He is not toxic-appearing.  HENT:     Head: Normocephalic and atraumatic.  Cardiovascular:     Rate and Rhythm: Normal rate and regular rhythm.  Pulmonary:     Effort: Pulmonary effort is normal.     Breath sounds: Normal breath  sounds. No wheezing.  Abdominal:     General: Abdomen is protuberant. Bowel sounds are normal.     Palpations: Abdomen is soft. There is no mass or pulsatile mass.     Tenderness: There is no abdominal tenderness.  Musculoskeletal:     Right shoulder: Normal range of motion.     Left shoulder: No tenderness or bony tenderness. Normal range of motion.     Left upper arm: No tenderness.     Left elbow: Normal range of motion.     Left forearm: Tenderness present. No swelling.     Left wrist: No swelling or tenderness. Normal range of motion.     Cervical back: Neck supple.     Right lower leg: No edema.     Left lower leg: No edema.     Comments: Well-healed surgical scar left forearm  Lymphadenopathy:     Cervical: No cervical adenopathy.  Skin:    General: Skin is warm and dry.  Neurological:     Mental Status: He is alert and oriented to person, place, and time.  Psychiatric:         Behavior: Behavior normal.     Results for orders placed or performed during the hospital encounter of 12/04/23  Hemoglobin A1c   Collection Time: 12/04/23  9:24 AM  Result Value Ref Range   Hgb A1c MFr Bld 5.6 4.8 - 5.6 %   Mean Plasma Glucose 114 mg/dL  PSA   Collection Time: 12/04/23  9:24 AM  Result Value Ref Range   Prostatic Specific Antigen 1.52 0.00 - 4.00 ng/mL  Lipid panel   Collection Time: 12/04/23  9:24 AM  Result Value Ref Range   Cholesterol 82 0 - 200 mg/dL   Triglycerides 161 <096 mg/dL   HDL 29 (L) >04 mg/dL   Total CHOL/HDL Ratio 2.8 RATIO   VLDL 21 0 - 40 mg/dL   LDL Cholesterol 32 0 - 99 mg/dL  Comprehensive metabolic panel   Collection Time: 12/04/23  9:24 AM  Result Value Ref Range   Sodium 141 135 - 145 mmol/L   Potassium 4.6 3.5 - 5.1 mmol/L   Chloride 107 98 - 111 mmol/L   CO2 26 22 - 32 mmol/L   Glucose, Bld 118 (H) 70 - 99 mg/dL   BUN 18 6 - 20 mg/dL   Creatinine, Ser 5.40 0.61 - 1.24 mg/dL   Calcium 9.6 8.9 - 98.1 mg/dL   Total Protein 7.3 6.5 - 8.1 g/dL   Albumin 4.1 3.5 - 5.0 g/dL   AST 14 (L) 15 - 41 U/L   ALT 15 0 - 44 U/L   Alkaline Phosphatase 67 38 - 126 U/L   Total Bilirubin 0.7 0.0 - 1.2 mg/dL   GFR, Estimated >19 >14 mL/min   Anion gap 8 5 - 15      Assessment & Plan:    Encounter Diagnoses  Name Primary?   Arteriosclerotic cardiovascular disease (ASCVD) Yes   Bradykinesia    Left arm pain      CAD -reviewed labs with pt -continue current medications -continue with cardiology per their recommendation  Parkinson's -continue with neurology per their recommendation  LUE pain -refer to PT -pt says he has [facility] financial assistance  Pt to follow up here 4 months. He is to contact office sooner prn

## 2023-12-09 ENCOUNTER — Ambulatory Visit: Payer: Self-pay | Admitting: Physician Assistant

## 2023-12-23 ENCOUNTER — Telehealth: Payer: Self-pay

## 2023-12-23 NOTE — Telephone Encounter (Signed)
Received call from [facility] client that his Carbidopa-Levodopa at [facility] in [place] has increased in cost and he is unemployed at this time. He is asking if this is available with [facility], and it is not. Checked [employer] for one month supply at [facility] and other area pharmacies. [facility] supply [facility]. Suggested he call and check also with [facility] for pricing without insurance for 1 month and 3 month supply and compare pricing and if it's more affordable with [facility] suggested [facility]. Reminded him to always ask for [employer] when getting his medication.  He is currently getting unemployment and has met with Ether Griffins SW with [facility] to begin process for Disability. Encouraged client to call if he has any other concerns or questions that we can help him with.   Francee Nodal RN [employer]

## 2023-12-29 ENCOUNTER — Ambulatory Visit (HOSPITAL_COMMUNITY): Payer: Self-pay | Attending: Physician Assistant | Admitting: Occupational Therapy

## 2023-12-29 ENCOUNTER — Other Ambulatory Visit: Payer: Self-pay

## 2023-12-29 ENCOUNTER — Encounter (HOSPITAL_COMMUNITY): Payer: Self-pay | Admitting: Occupational Therapy

## 2023-12-29 DIAGNOSIS — M79602 Pain in left arm: Secondary | ICD-10-CM | POA: Insufficient documentation

## 2023-12-29 DIAGNOSIS — R29898 Other symptoms and signs involving the musculoskeletal system: Secondary | ICD-10-CM | POA: Insufficient documentation

## 2023-12-29 NOTE — Patient Instructions (Signed)
 Complete 2-3x/day, 3x each holding for 30 seconds  1) Wrist Extensor Stretch: While standing or sitting upright, hold your injured arm straight out in front of you and point your fingers down toward the ground. With the hand of the uninjured arm, grasp the hand of the injured arm, thumb pressing on the palm, and try to bend the wrist further towards your body.     2) Wrist Flexor Stretch: While standing or sitting upright, hold injured arm straight out in front of you with palm pointed up. With uninjured arm, grasp hand with fingers on palm and try to stretch the wrist back towards you, palm away from body.

## 2023-12-29 NOTE — Therapy (Signed)
 OUTPATIENT OCCUPATIONAL THERAPY ORTHO EVALUATION  Patient Name: Edward Fisher MRN: [id number] DOB:1966-04-16, 58 y.o., male Today's Date: 12/29/2023    END OF SESSION:  OT End of Session - 12/29/23 1027     Visit Number 1    Number of Visits 1    Date for OT Re-Evaluation 12/30/23    Authorization Type Self-Pay, [facility] Financial Assistance    OT Start Time [id number]    OT Stop Time 1006    OT Time Calculation (min) 34 min    Activity Tolerance Patient tolerated treatment well    Behavior During Therapy WFL for tasks assessed/performed             Past Medical History:  Diagnosis Date   Arteriosclerotic cardiovascular disease (ASCVD)    Onset in 04/2012; CABG in 06/2012 w/ LIMA-LAD, L Rad-CFX, SVG-PL-PDA   Hyperlipidemia    goal LDL < 70   Insomnia    Nephrolithiasis    Past Surgical History:  Procedure Laterality Date   CORONARY ARTERY BYPASS GRAFT  06/24/2012   Procedure: CORONARY ARTERY BYPASS GRAFTING (CABG);  Surgeon: Delight Ovens, MD;  Location: [facility] OR;  Service: Open Heart Surgery;  Laterality: N/A;  Coronary Artery  Bypass X 4 utilizing endoscopic saphenous vein graft and  Left radial Artery Harvest    CORONARY PRESSURE/FFR STUDY N/A 11/14/2023   Procedure: CORONARY PRESSURE/FFR STUDY;  Surgeon: Orbie Pyo, MD;  Location: [facility] INV[facility] LAB;  Service: Cardiovascular;  Laterality: N/A;   CYSTOSCOPY W/ RETROGRADES Left 08/23/2015   Procedure: CYSTOSCOPY WITH RETROGRADE PYELOGRAM;  Surgeon: Malen Gauze, MD;  Location: [facility] ORS;  Service: Urology;  Laterality: Left;   CYSTOSCOPY WITH URETEROSCOPY, STONE BASKETRY AND STENT PLACEMENT Left 08/23/2015   Procedure: CYSTOSCOPY WITH URETEROSCOPY, STONE BASKET EXTRACTION;  Surgeon: Malen Gauze, MD;  Location: [facility] ORS;  Service: Urology;  Laterality: Left;   HOLMIUM LASER APPLICATION Left 08/23/2015   Procedure: HOLMIUM LASER APPLICATION;  Surgeon: Malen Gauze, MD;  Location: [facility] ORS;  Service: Urology;   Laterality: Left;   LEFT HEART CATH AND CORS/GRAFTS ANGIOGRAPHY N/A 11/14/2023   Procedure: LEFT HEART CATH AND CORS/GRAFTS ANGIOGRAPHY;  Surgeon: Orbie Pyo, MD;  Location: [facility] LAB;  Service: Cardiovascular;  Laterality: N/A;   LEFT HEART CATHETERIZATION WITH CORONARY ANGIOGRAM N/A 06/23/2012   Procedure: LEFT HEART CATHETERIZATION WITH CORONARY ANGIOGRAM;  Surgeon: [name], MD;  Location: [facility] CATH LAB;  Service: Cardiovascular;  Laterality: N/A;   RHINOPLASTY     TONSILLECTOMY  ~1971   Patient Active Problem List   Diagnosis Date Noted   Other intervertebral disc degeneration, lumbar region 07/17/2023   S/P CABG (coronary artery bypass graft) 03/19/2023   Weakness 03/26/2022   Tremor of right hand 05/02/2020   Uncontrolled REM sleep behavior disorder 05/02/2020   Hemiparesis of right dominant side (HCC) 05/02/2020   Chest pain 06/22/2014   Essential hypertension 06/22/2014   Belching 06/22/2014   Local skin infection 03/16/2013   Arteriosclerotic cardiovascular disease (ASCVD) 06/26/2012   Hyperlipidemia    PCP: Jacquelin Hawking, PA-C  REFERRING PROVIDER: Jacquelin Hawking, PA-C  ONSET DATE: ~3-4 months  REFERRING DIAG: M79.602 (ICD-10-CM) - Left arm pain   THERAPY DIAG:  No diagnosis found.  Rationale for Evaluation and Treatment: Rehabilitation  SUBJECTIVE:   SUBJECTIVE STATEMENT: S: It just hurts randomly sometimes and I haven't done anything to it.  Pt accompanied by: self  PERTINENT HISTORY: Pt is a 58 y/o male presenting with left  volar forearm pain present for 2-4 months or maybe a bit more. Pt with a history of open heart surgery with arterial graft from left forearm. Also with hx of Parkinson's disease which affects the RUE more than the LUE.   PRECAUTIONS: None  WEIGHT BEARING RESTRICTIONS: No  PAIN:  Are you having pain? Yes: NPRS scale: 1/10 Pain location: volar forearm Pain description: stabbing, radiating up to the  bicep Aggravating factors: lifting items Relieving factors: not picking things up  FALLS: Has patient fallen in last 6 months? No  PLOF: Independent  PATIENT GOALS: To have less pain in the arm.   NEXT MD VISIT: 04/06/24  OBJECTIVE:  Note: Objective measures were completed at Evaluation unless otherwise noted.  HAND DOMINANCE: Right  ADLs: Pt reports pain intermittently, mostly with lifting items. Holding something with elbow bent like a songbook it hurts when straightening the arm out. Can feel the discomfort more when lifting the arm up. 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  FUNCTIONAL OUTCOME MEASURES: Quick Dash: 15.91  UPPER EXTREMITY ROM:     A/ROM is WNL throughout LUE.    UPPER EXTREMITY MMT:     MMT Left eval  Elbow flexion 4+/5  Elbow extension 4+/5  Wrist flexion 5/5  Wrist extension 5/5  Wrist ulnar deviation 5/5  Wrist radial deviation 5/5  Wrist pronation 5/5  Wrist supination 5/5  (Blank rows = not tested)  HAND FUNCTION: Grip strength: Right: 52 lbs; Left: 75 lbs  SENSATION: WFL  EDEMA: None  COGNITION: Overall cognitive status: Within functional limits for tasks assessed  OBSERVATIONS: Testing negative for carpal tunnel, DeQuervain's, Pronator syndrome, Supinator syndrome, epicondylitis     PATIENT EDUCATION: Education details: wrist flexion/extension stretch Person educated: Patient Education method: Explanation, Demonstration, and Handouts Education comprehension: verbalized understanding and returned demonstration  HOME EXERCISE PROGRAM: wrist flexion/extension stretches    GOALS: Goals reviewed with patient? Yes  SHORT TERM GOALS: Target date: 12/29/23  Pt will be educated on HEP to improve pain and mobility of LUE during ADLs.   Goal status: MET   ASSESSMENT:  CLINICAL IMPRESSION: Patient is a 58 y.o. male who was seen today for occupational  therapy evaluation for left forearm pain.  Pt presenting with pain in the volar forearm, radiating to bicep at times. Pt testing negative for a variety of orthopedic conditions today, unable to elicit pain with MMT or a variety of positions, with exception of performing supination with elbow fully extended. Pt has pain intermittently with lifting, however is completing ADLs without difficulty. Pt reports pain feels like a deep ache or stab, does have a history of arterial graft from the left forearm. Recommend return to MD for further assessment and follow up, pt agreeable.   PERFORMANCE DEFICITS: in functional skills including ADLs, IADLs, pain, fascial restrictions, and UE functional use  IMPAIRMENTS: are limiting patient from ADLs, IADLs, and work.   COMORBIDITIES: may have co-morbidities  that affects occupational performance. Patient will benefit from skilled OT to address above impairments and improve overall function.  MODIFICATION OR ASSISTANCE TO COMPLETE EVALUATION: No modification of tasks or assist necessary to complete an evaluation.  OT OCCUPATIONAL PROFILE AND HISTORY: Problem focused assessment: Including review of records relating to presenting problem.  CLINICAL DECISION MAKING: LOW - limited treatment options, no task modification necessary  REHAB POTENTIAL: Good  EVALUATION COMPLEXITY: Low      PLAN:  OT  FREQUENCY: one time visit  OT DURATION: 1 week  PLANNED INTERVENTIONS: 97168 OT Re-evaluation, 97535 self care/ADL training, 06301 therapeutic exercise, 97530 therapeutic activity, 97112 neuromuscular re-education, 97140 manual therapy, patient/family education, and DME and/or AE instructions  RECOMMENDED OTHER SERVICES: Possible orthopedic assessment  CONSULTED AND AGREED WITH PLAN OF CARE: Patient  PLAN FOR NEXT SESSION: N/A-follow up with MD   Ezra Sites, OTR/L  [phone] 12/29/2023, 10:36 AM

## 2024-01-05 NOTE — Progress Notes (Unsigned)
  Cardiology Office Note:   Date:  01/07/2024  ID:  Edward Fisher, DOB Jul 31, 1966, MRN [id number] PCP: Jacquelin Hawking, PA-C   [facility] Providers Cardiologist:  Rollene Rotunda, MD {  History of Present Illness:   Edward Fisher is a 58 y.o. male PMH of coronary artery disease status post CABG-2013, hyperlipidemia, HTN, unstable angina.  He underwent nuclear stress test which showed low risk and EF of 68%.  No evidence of ischemia or infarct were noted.  He had chest pain earlier this year.  He underwent left heart cath on 11/14/2023.  He was noted to have patent LIMA-LAD, distal LAD with moderate stenosis which was not amenable to PCI.  Occluded vein graft to PLV and PDA.  He was also noted to have moderate left main disease.  FFR was negative at 0.98.  He was also noted to have an occluded mid right coronary artery.  He was not a candidate for CTO PCI based on his angiography.  Medical management was recommended.  Since he was last seen he has done well.  He is doing some walking for exercise.  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 The patient denies any new symptoms such as chest discomfort, neck or arm discomfort. There has been no new shortness of breath, PND or orthopnea. There have been no reported palpitations, presyncope or syncope.   He has some musculoskeletal discomfort that has had since his bypass.  He had a little lower extremity right-sided swelling which has been chronic.  ROS: As stated in the HPI and negative for all other systems.  Studies Reviewed:    EKG:   NA  Risk Assessment/Calculations:         Physical Exam:   VS:  BP 116/74   Pulse 70   Ht 5\' 10"  (1.778 m)   Wt 202 lb 12.8 oz (92 kg)   SpO2 97%   BMI 29.10 kg/m    Wt Readings from Last 3 Encounters:  01/07/24 202 lb 12.8 oz (92 kg)  11/21/23 202 lb 12.8 oz (92 kg)  11/14/23 203 lb (92.1 kg)     GEN: Well nourished, well developed in no acute distress NECK: No JVD; No carotid  bruits CARDIAC: RRR, no murmurs, rubs, gallops RESPIRATORY:  Clear to auscultation without rales, wheezing or rhonchi  ABDOMEN: Soft, non-tender, non-distended EXTREMITIES:  No edema; No deformity   ASSESSMENT AND PLAN:   Coronary artery disease :   The patient has no new sypmtoms.  No further cardiovascular testing is indicated.  We will continue with aggressive risk reduction and meds as listed.   Hyperlipidemia:   32 with an HDL of 29.  No change in therapy.   Essential hypertension:   BP is at target.  No change in therapy.      Follow up with me in one year.   Signed, Rollene Rotunda, MD

## 2024-01-07 ENCOUNTER — Telehealth: Payer: Self-pay

## 2024-01-07 ENCOUNTER — Ambulatory Visit: Payer: Self-pay | Attending: Cardiology | Admitting: Cardiology

## 2024-01-07 ENCOUNTER — Encounter: Payer: Self-pay | Admitting: Cardiology

## 2024-01-07 VITALS — BP 116/74 | HR 70 | Ht 70.0 in | Wt 202.8 lb

## 2024-01-07 DIAGNOSIS — E785 Hyperlipidemia, unspecified: Secondary | ICD-10-CM

## 2024-01-07 DIAGNOSIS — I2510 Atherosclerotic heart disease of native coronary artery without angina pectoris: Secondary | ICD-10-CM

## 2024-01-07 DIAGNOSIS — I10 Essential (primary) hypertension: Secondary | ICD-10-CM

## 2024-01-07 NOTE — Telephone Encounter (Addendum)
 Called [facility] client for follow up today. I was contacted by Octavio Graves Social work with [facility] asking if Mr. Blick had applied for Medicaid. He had originally applied  August 2024, but assume was denied due to income. Called Mr. Shed and he has not worked since January 2025 due to his back , changes with company and his recent cardiac issues. He was referred to Ether Griffins SW with [facility] for possible Disability application and he thinks Medicaid and Food stamps, he isn't sure. I called Ether Griffins and left message requesting a call back.  Plan: will call Mr. Weimer back once I have confirmation if another Medicaid application has recently been submitted and determine if any next steps would be needed. Discussed that [facility] could assist him in applying for Medicaid if needed. He is agreeable. I have updated Octavio Graves SW with [facility] of all the above and will update her once I hear from Mr. Yow. Mr. Borba has an appointment in [place] this afternoon with cardiology. Note [facility] not showing approved for Medicaid at this time.   UPDATE: 1051 am Ether Griffins SW with [facility] returned call. Mr. Brafford is receiving unemployment for total 12 weeks (per client) and he was over income for Medicaid with that income. Discussed with Mr. Faucett by phone and he confirmed unemployment he believes he has about 2 months left. Discussed that he can contact [facility] office and one of our staff would assist in applying for Medicaid once he no longer has income and is still not working. He stated understanding. Updated Octavio Graves SW with [facility] on this information.  Francee Nodal RN [employer]

## 2024-01-07 NOTE — Patient Instructions (Signed)
 Medication Instructions:  No changes.  *If you need a refill on your cardiac medications before your next appointment, please call your pharmacy*    Follow-Up: At [facility], you and your health needs are our priority.  As part of our continuing mission to provide you with exceptional heart care, we have created designated Provider Care Teams.  These Care Teams include your primary Cardiologist (physician) and Advanced Practice Providers (APPs -  Physician Assistants and Nurse Practitioners) who all work together to provide you with the care you need, when you need it.  We recommend signing up for the patient portal called "MyChart".  Sign up information is provided on this After Visit Summary.  MyChart is used to connect with patients for Virtual Visits (Telemedicine).  Patients are able to view lab/test results, encounter notes, upcoming appointments, etc.  Non-urgent messages can be sent to your provider as well.   To learn more about what you can do with MyChart, go to [email or web address].    Your next appointment:   1 year(s) In [place] office please.  Provider:   Rollene Rotunda, MD     Other Instructions

## 2024-02-26 ENCOUNTER — Telehealth: Payer: Self-pay

## 2024-03-04 ENCOUNTER — Other Ambulatory Visit: Payer: Self-pay | Admitting: Physician Assistant

## 2024-03-04 MED ORDER — ISOSORBIDE MONONITRATE ER 30 MG PO TB24: 30.0000 mg | ORAL_TABLET | Freq: Every day | ORAL | 0 refills | Status: DC

## 2024-03-04 MED ORDER — ATORVASTATIN CALCIUM 40 MG PO TABS: 40.0000 mg | ORAL_TABLET | Freq: Every day | ORAL | 0 refills | Status: DC

## 2024-03-22 ENCOUNTER — Other Ambulatory Visit: Payer: Self-pay | Admitting: Physician Assistant

## 2024-03-22 DIAGNOSIS — I2510 Atherosclerotic heart disease of native coronary artery without angina pectoris: Secondary | ICD-10-CM

## 2024-03-22 DIAGNOSIS — I10 Essential (primary) hypertension: Secondary | ICD-10-CM

## 2024-03-22 DIAGNOSIS — E785 Hyperlipidemia, unspecified: Secondary | ICD-10-CM

## 2024-03-26 ENCOUNTER — Other Ambulatory Visit: Payer: Self-pay | Admitting: Physician Assistant

## 2024-03-30 ENCOUNTER — Telehealth: Payer: Self-pay

## 2024-03-30 NOTE — Telephone Encounter (Signed)
 Mr. Edward Fisher called asking about his Medicaid and when it is effective. He has his card. Discussed that it is effective as soon as approved. Sent him a text with a list of providers accepting New Medicaid. Recommended trying [facility] as they are in [place], but any on the list are accepting.   Encouraged him to call if he has any questions further.  Will follow along as client transitions to Medicaid and finds a new provider as he is no longer eligible for [facility].  Kris Pester RN [employer]

## 2024-04-06 ENCOUNTER — Ambulatory Visit: Payer: Self-pay | Admitting: Physician Assistant

## 2024-04-06 ENCOUNTER — Encounter: Payer: Self-pay | Admitting: Physician Assistant

## 2024-04-06 VITALS — BP 111/71 | HR 59 | Temp 97.9°F | Ht 70.0 in | Wt 202.5 lb

## 2024-04-06 DIAGNOSIS — R258 Other abnormal involuntary movements: Secondary | ICD-10-CM

## 2024-04-06 DIAGNOSIS — E785 Hyperlipidemia, unspecified: Secondary | ICD-10-CM

## 2024-04-06 DIAGNOSIS — I10 Essential (primary) hypertension: Secondary | ICD-10-CM

## 2024-04-06 DIAGNOSIS — I2510 Atherosclerotic heart disease of native coronary artery without angina pectoris: Secondary | ICD-10-CM

## 2024-04-06 DIAGNOSIS — G8929 Other chronic pain: Secondary | ICD-10-CM

## 2024-04-06 NOTE — Patient Instructions (Signed)
 Plan of Treatment - Upcoming Encounters Upcoming Encounters Date Type Department Care Team (Latest Contact Info) Description  05/10/2024 11:30 AM EDT Office Visit [facility])  [address]  [address]  [place], [place] [address]  [phone]  Kaaren Ora, MD  [address]  [address]  [place], [place] [address]  [phone] (Work)  [phone] Engineer, production)

## 2024-04-06 NOTE — Progress Notes (Signed)
 BP 111/71   Pulse (!) 59   Temp 97.9 F (36.6 C)   Ht 5\' 10"  (1.778 m)   Wt 202 lb 8 oz (91.9 kg)   SpO2 98%   BMI 29.06 kg/m    Subjective:    Patient ID: Edward Fisher, male    DOB: [birth year], 58 y.o.   MRN: [id number]  HPI: Edward Fisher is a 58 y.o. male presenting on 04/06/2024 for Coronary Artery Disease   HPI  Chief Complaint  Patient presents with   Coronary Artery Disease      Pt now has medicaid.  He has appt with neurologist in July and is on recall for cardiology for November  He says he has No sob, cp.  He Says gabapentin sometimes doesn't help for his back pain.   He has no new issues but wanted to talk about getting new pcp since he's been coming to [facility] since 2013.  He has new patient appointment at [facility] in August.  He says he has plenty of meds to last until his August appointment.    Relevant past medical, surgical, family and social history reviewed and updated as indicated. Interim medical history since our last visit reviewed. Allergies and medications reviewed and updated.  . Current Outpatient Medications:    aspirin  81 MG chewable tablet, Chew 81 mg by mouth every evening. TO START DOSAGE IN 6 MONTHS APPROXIMATELY July 2014, Disp: , Rfl:    atorvastatin  (LIPITOR ) 40 MG tablet, Take 1 tablet (40 mg total) by mouth daily., Disp: 90 tablet, Rfl: 0   carbidopa-levodopa (SINEMET IR) 25-100 MG tablet, Take 6 tablets by mouth See admin instructions. Take 1 tablet in the morning and lunch, Then 2 tables at 1600 and 1 tablet at 2000 and 1 tablet at bedtime, Disp: , Rfl:    gabapentin (NEURONTIN) 300 MG capsule, Take 600 mg by mouth 3 (three) times daily., Disp: , Rfl:    isosorbide  mononitrate (IMDUR ) 30 MG 24 hr tablet, Take 1 tablet (30 mg total) by mouth daily., Disp: 90 tablet, Rfl: 0   metoprolol  succinate (TOPROL -XL) 25 MG 24 hr tablet, Take 1 tablet (25 mg total) by mouth daily., Disp: 30 tablet, Rfl: 3   nitroGLYCERIN  (NITROSTAT ) 0.4 MG SL  tablet, Place 1 tablet (0.4 mg total) under the tongue every 5 (five) minutes as needed for chest pain., Disp: 25 tablet, Rfl: PRN   Omega-3 Fatty Acids (FISH OIL) 1000 MG CAPS, Take 1,000 mg by mouth daily., Disp: , Rfl:    tamsulosin  (FLOMAX ) 0.4 MG CAPS capsule, Take 1 capsule by mouth once daily, Disp: 30 capsule, Rfl: 0   sildenafil  (VIAGRA ) 100 MG tablet, Take 0.5-1 tablets by mouth daily as needed. (Patient not taking: Reported on 04/06/2024), Disp: , Rfl:     Review of Systems  Per HPI unless specifically indicated above     Objective:     BP 111/71   Pulse (!) 59   Temp 97.9 F (36.6 C)   Ht 5\' 10"  (1.778 m)   Wt 202 lb 8 oz (91.9 kg)   SpO2 98%   BMI 29.06 kg/m   Wt Readings from Last 3 Encounters:  04/06/24 202 lb 8 oz (91.9 kg)  01/07/24 202 lb 12.8 oz (92 kg)  11/21/23 202 lb 12.8 oz (92 kg)    Physical Exam Constitutional:      General: He is not in acute distress.    Appearance: He is not toxic-appearing.  HENT:  Head: Normocephalic and atraumatic.  Pulmonary:     Effort: Pulmonary effort is normal. No respiratory distress.  Skin:    General: Skin is warm and dry.  Neurological:     Mental Status: He is alert and oriented to person, place, and time.  Psychiatric:        Behavior: Behavior normal.           Assessment & Plan:     Encounter Diagnoses  Name Primary?   Arteriosclerotic cardiovascular disease (ASCVD) Yes   Bradykinesia    Chronic back pain, unspecified back location, unspecified back pain laterality    Essential hypertension    Hyperlipidemia, unspecified hyperlipidemia type      -Pt encouraged to keep appointment with new PCP, neurologist and cardiologist  -He is to continue current medications.  He is to contact office if he finds that he will run out before his new patient appointment

## 2024-04-26 ENCOUNTER — Other Ambulatory Visit: Payer: Self-pay | Admitting: Physician Assistant

## 2024-05-06 ENCOUNTER — Other Ambulatory Visit: Payer: Self-pay | Admitting: Physician Assistant

## 2024-05-24 ENCOUNTER — Other Ambulatory Visit: Payer: Self-pay | Admitting: Physician Assistant

## 2024-05-28 ENCOUNTER — Other Ambulatory Visit: Payer: Self-pay | Admitting: Physician Assistant

## 2024-06-08 NOTE — Progress Notes (Addendum)
 New Patient Office Visit  Subjective   Patient ID: Edward Fisher, male    DOB: July 09, 1966  Age: 58 y.o. MRN: [id number]  CC:  Chief Complaint  Patient presents with   Establish Care   Dysuria    Some pain with urination started yesterday    HPI Edward Fisher. Thunder is a 58 year old male who presents today to establish care. He reports that he previously received care at a free clinic in [place] but is no longer eligible to go there since obtaining Medicaid coverage. His medical history includes hypertension, hyperlipidemia, coronary artery bypass grafting (CABG) in 2013, and Parkinson's disease diagnosed in 2023. He is currently followed by a neurologist, last seen two months ago, with follow-ups scheduled 2-3 times per year.  The patient reports chronic lower back pain for the past five years, managed with Gabapentin 1800 mg daily. Pain is described as intermittent, with severity ranging from 3-4/10 to 10/10 depending on activity level. Standing exacerbates the pain, while sitting alleviates it. He has had previous lumbar and cervical MRIs done on 04/03/2021 and underwent back injections with no significant improvement. He reports some intermittent memory lapses. Patient understands that continued use of Gabapentin at the current dose requires referral to a pain management specialist. He agrees to begin a slow taper and will be referred to orthopedics for further evaluation.  He also reports urinary frequency and urgency for the past two days. He denies fever or chills. He has a history of kidney stones and is concerned about a possible urinary tract infection.  Outpatient Encounter Medications as of 06/10/2024  Medication Sig   aspirin  81 MG chewable tablet Chew 81 mg by mouth every evening. TO START DOSAGE IN 6 MONTHS APPROXIMATELY July 2014   atorvastatin  (LIPITOR ) 40 MG tablet Take 1 tablet by mouth once daily   carbidopa-levodopa (SINEMET IR) 25-100 MG tablet Take 6 tablets by mouth See  admin instructions. Take 1 tablet in the morning and lunch, Then 2 tables at 1600 and 1 tablet at 2000 and 1 tablet at bedtime   gabapentin (NEURONTIN) 300 MG capsule Take 600 mg by mouth 3 (three) times daily.   isosorbide  mononitrate (IMDUR ) 30 MG 24 hr tablet Take 1 tablet (30 mg total) by mouth daily.   metoprolol  succinate (TOPROL -XL) 25 MG 24 hr tablet Take 1 tablet by mouth once daily   nitroGLYCERIN  (NITROSTAT ) 0.4 MG SL tablet Place 1 tablet (0.4 mg total) under the tongue every 5 (five) minutes as needed for chest pain.   Omega-3 Fatty Acids (FISH OIL) 1000 MG CAPS Take 1,000 mg by mouth daily.   [EXPIRED] sulfamethoxazole -trimethoprim  (BACTRIM  DS) 800-160 MG tablet Take 1 tablet by mouth 2 (two) times daily for 7 days.   tamsulosin  (FLOMAX ) 0.4 MG CAPS capsule Take 1 capsule by mouth once daily   sildenafil  (VIAGRA ) 100 MG tablet Take 0.5-1 tablets by mouth daily as needed. (Patient not taking: Reported on 06/23/2024)   No facility-administered encounter medications on file as of 06/10/2024.    Past Medical History:  Diagnosis Date   Arteriosclerotic cardiovascular disease (ASCVD)    Onset in 04/2012; CABG in 06/2012 w/ LIMA-LAD, L Rad-CFX, SVG-PL-PDA   Hyperlipidemia    goal LDL < 70   Insomnia    Nephrolithiasis     Past Surgical History:  Procedure Laterality Date   CORONARY ARTERY BYPASS GRAFT  06/24/2012   Procedure: CORONARY ARTERY BYPASS GRAFTING (CABG);  Surgeon: Dallas KATHEE Jude, MD;  Location: [facility] OR;  Service: Open Heart Surgery;  Laterality: N/A;  Coronary Artery  Bypass X 4 utilizing endoscopic saphenous vein graft and  Left radial Artery Harvest    CORONARY PRESSURE/FFR STUDY N/A 11/14/2023   Procedure: CORONARY PRESSURE/FFR STUDY;  Surgeon: Wendel Lurena POUR, MD;  Location: [facility] INVASIVE CV LAB;  Service: Cardiovascular;  Laterality: N/A;   CYSTOSCOPY W/ RETROGRADES Left 08/23/2015   Procedure: CYSTOSCOPY WITH RETROGRADE PYELOGRAM;  Surgeon: Belvie LITTIE Clara, MD;   Location: [facility] ORS;  Service: Urology;  Laterality: Left;   CYSTOSCOPY WITH URETEROSCOPY, STONE BASKETRY AND STENT PLACEMENT Left 08/23/2015   Procedure: CYSTOSCOPY WITH URETEROSCOPY, STONE BASKET EXTRACTION;  Surgeon: Belvie LITTIE Clara, MD;  Location: [facility] ORS;  Service: Urology;  Laterality: Left;   HOLMIUM LASER APPLICATION Left 08/23/2015   Procedure: HOLMIUM LASER APPLICATION;  Surgeon: Belvie LITTIE Clara, MD;  Location: [facility] ORS;  Service: Urology;  Laterality: Left;   LEFT HEART CATH AND CORS/GRAFTS ANGIOGRAPHY N/A 11/14/2023   Procedure: LEFT HEART CATH AND CORS/GRAFTS ANGIOGRAPHY;  Surgeon: Wendel Lurena POUR, MD;  Location: [facility] LAB;  Service: Cardiovascular;  Laterality: N/A;   LEFT HEART CATHETERIZATION WITH CORONARY ANGIOGRAM N/A 06/23/2012   Procedure: LEFT HEART CATHETERIZATION WITH CORONARY ANGIOGRAM;  Surgeon: Ozell Fell, MD;  Location: [facility] CATH LAB;  Service: Cardiovascular;  Laterality: N/A;   RHINOPLASTY     TONSILLECTOMY  ~1971    Family History  Problem Relation Age of Onset   Hypertension Mother    Ulcers Father        Peptic ulcer disease   Coronary artery disease Father        Stents   Coronary artery disease Brother        CABG    Social History   Socioeconomic History   Marital status: Widowed    Spouse name: Not on file   Number of children: Not on file   Years of education: Not on file   Highest education level: Not on file  Occupational History   Occupation: Therapist, sports: [employer]    Comment: self-employed  Tobacco Use   Smoking status: Never   Smokeless tobacco: Former    Types: Chew    Quit date: 1997   Tobacco comments:    06/22/2012 quit chewing 10-15 years ago  Vaping Use   Vaping status: Never Used  Substance and Sexual Activity   Alcohol use: No    Alcohol/week: 0.0 standard drinks of alcohol   Drug use: No   Sexual activity: Yes  Other Topics Concern   Not on file  Social History Narrative    Regular exercise: No   Social Drivers of Corporate investment banker Strain: Low Risk  (12/09/2023)   Received from [facility]   Overall Financial Resource Strain (CARDIA)    Difficulty of Paying Living Expenses: Not very hard  Food Insecurity: No Food Insecurity (12/09/2023)   Received from [facility]   Hunger Vital Sign    Within the past 12 months, you worried that your food would run out before you got the money to buy more.: Never true    Within the past 12 months, the food you bought just didn't last and you didn't have money to get more.: Never true  Transportation Needs: No Transportation Needs (12/09/2023)   Received from [facility] - Transportation    Lack of Transportation (Medical): No    Lack of Transportation (Non-Medical): No  Physical Activity: Not  on file  Stress: Not on file  Social Connections: Unknown (03/19/2022)   [facility] from [facility]   Social Network    Social Network: Not on file  Intimate Partner Violence: Unknown (02/08/2022)   Received from [facility]   HITS    Physically Hurt: Not on file    Insult or Talk Down To: Not on file    Threaten Physical Harm: Not on file    Scream or Curse: Not on file    Review of Systems  Constitutional:  Negative for chills and fever.  HENT:  Negative for congestion and sore throat.   Respiratory:  Negative for cough, shortness of breath and wheezing.   Cardiovascular:  Negative for chest pain and leg swelling.  Gastrointestinal:  Negative for abdominal pain, blood in stool, constipation, nausea and vomiting.  Genitourinary:  Positive for dysuria and flank pain.  Musculoskeletal:  Negative for falls.  Skin:  Negative for itching and rash.  Neurological:  Negative for dizziness and headaches.   Negative unless indicated in HPI    Objective   BP 100/64   Pulse 61   Temp (!) 97.5 F (36.4 C) (Temporal)   Ht 5' 10 (1.778 m)   Wt 195 lb 12.8 oz (88.8 kg)   SpO2 98%   BMI 28.09 kg/m    Physical Exam Vitals and nursing note reviewed.  Constitutional:      General: He is not in acute distress. HENT:     Head: Normocephalic and atraumatic.     Right Ear: Tympanic membrane, ear canal and external ear normal. There is no impacted cerumen.     Left Ear: Tympanic membrane, ear canal and external ear normal. There is no impacted cerumen.     Nose: Nose normal.     Mouth/Throat:     Mouth: Mucous membranes are moist.  Eyes:     Extraocular Movements: Extraocular movements intact.     Conjunctiva/sclera: Conjunctivae normal.     Pupils: Pupils are equal, round, and reactive to light.  Cardiovascular:     Heart sounds: Normal heart sounds.  Pulmonary:     Effort: Pulmonary effort is normal.     Breath sounds: Normal breath sounds.  Abdominal:     General: Bowel sounds are normal.     Palpations: Abdomen is soft.  Musculoskeletal:        General: Normal range of motion.     Right lower leg: No edema.     Left lower leg: No edema.  Skin:    General: Skin is warm and dry.     Findings: No rash.  Neurological:     Mental Status: He is alert and oriented to person, place, and time.  Psychiatric:        Mood and Affect: Mood normal.        Behavior: Behavior normal.        Thought Content: Thought content normal.        Judgment: Judgment normal.     Last CBC Lab Results  Component Value Date   WBC 6.2 06/10/2024   HGB 15.1 06/10/2024   HCT 47.2 06/10/2024   MCV 94 06/10/2024   MCH 30.0 06/10/2024   RDW 12.2 06/10/2024   PLT 156 06/10/2024   Last metabolic panel Lab Results  Component Value Date   GLUCOSE 109 (H) 06/10/2024   NA 141 06/10/2024   K 4.8 06/10/2024   CL 105 06/10/2024   CO2 20 06/10/2024   BUN  15 06/10/2024   CREATININE 1.09 06/10/2024   GFRNONAA >60 12/04/2023   CALCIUM  9.6 06/10/2024   PROT 7.3 12/04/2023   ALBUMIN  4.1 12/04/2023   BILITOT 0.7 12/04/2023   ALKPHOS 67 12/04/2023   AST 14 (L) 12/04/2023   ALT 15 12/04/2023    ANIONGAP 8 12/04/2023   Last lipids Lab Results  Component Value Date   CHOL 88 (L) 06/10/2024   HDL 30 (L) 06/10/2024   LDLCALC 43 06/10/2024   TRIG 67 06/10/2024   CHOLHDL 2.9 06/10/2024   Last hemoglobin A1c Lab Results  Component Value Date   HGBA1C 5.6 12/04/2023   Last thyroid  functions Lab Results  Component Value Date   TSH 1.580 06/10/2024   T4TOTAL 4.4 (L) 06/10/2024        Assessment & Plan:  Dysuria -     Urinalysis, Routine w reflex microscopic -     Sulfamethoxazole -Trimethoprim ; Take 1 tablet by mouth 2 (two) times daily for 7 days.  Dispense: 14 tablet; Refill: 0  Chronic thoracic back pain, unspecified back pain laterality -     Ambulatory referral to Orthopedics -     DG Thoracic Spine 2 View  Essential hypertension -     BMP8+EGFR  Mixed hyperlipidemia -     Thyroid  Panel With TSH -     Lipid panel  Overweight with body mass index (BMI) of 28 to 28.9 in adult  Nocturia -     PSA, total and free  Encounter for general adult medical examination with abnormal findings -     BMP8+EGFR -     CBC with Differential/Platelet -     Thyroid  Panel With TSH -     Lipid panel -     PSA, total and free  Screening for colon cancer -     Ambulatory referral to Gastroenterology  Other orders -     Microscopic Examination    Edward Fisher is a 58 year old-year-old Caucasian male seen today to establish care, no acute distress Chronic lower back pain - long-standing, partially responsive to Gabapentin. No recent imaging. Reports poor response to prior injections. Gabapentin use - currently taking 1800 mg daily. Will require taper and possible referral to pain management. Urinary symptoms - frequency and urgency for two days, no systemic signs. Likely UTI. History of nephrolithiasis. Hypertension - stable, needs continued management. Hyperlipidemia - needs evaluation and continuation of therapy. Parkinson's disease - stable under neurologist's care. History  of CABG (2013) - continue cardiac risk monitoring.  Back Pain: -Order lumbar spine X-ray. -Refer to orthopedics for further evaluation. Begin Gabapentin taper: Week 1: 600 mg TID Week 2: 600 mg BID Week 3: 600 mg daily Week 4: 600 mg every other day until supply finished Educated patient on need for pain management referral if continuation of high-dose Gabapentin is desired.  Urinary Symptoms: Urine dip and UA with reflex to culture. Plan to treat with broad spectrum ATB bactrim  BID for 7-days, no culture needed  Hypertension and Hyperlipidemia: Continue Metoprolol  25 mg daily Lipitor  40 mg no refill needed  Labs: Cbc, CMP, Lipid, TSH, PSA  Health maintenance: GI referral for colonoscopy  Parkinson's Disease: Continue carbidopa/levodopa as prescribed by neurology.  Encourage follow-up with neurologist every 4-6 months. Continue healthy lifestyle choices, including diet (rich in fruits, vegetables, and lean proteins, and low in salt and simple carbohydrates) and exercise (at least 30 minutes of moderate physical activity daily).     The above assessment and management plan was discussed with  the patient. The patient verbalized understanding of and has agreed to the management plan. Patient is aware to call the clinic if they develop any new symptoms or if symptoms persist or worsen. Patient is aware when to return to the clinic for a follow-up visit. Patient educated on when it is appropriate to go to the emergency department.  Return in about 3 months (around 09/10/2024) for chronic diseses mangement.  Edward Mirkin [place] Thompson, DNP [facility] [address] [place], [place] [address] [phone]  Note: This document was prepared by [employer] voice dictation technology and any errors that results from this process are unintentional.

## 2024-06-10 ENCOUNTER — Encounter: Payer: Self-pay | Admitting: Nurse Practitioner

## 2024-06-10 ENCOUNTER — Ambulatory Visit (INDEPENDENT_AMBULATORY_CARE_PROVIDER_SITE_OTHER)

## 2024-06-10 ENCOUNTER — Ambulatory Visit: Payer: Self-pay | Admitting: Nurse Practitioner

## 2024-06-10 VITALS — BP 100/64 | HR 61 | Temp 97.5°F | Ht 70.0 in | Wt 195.8 lb

## 2024-06-10 DIAGNOSIS — E782 Mixed hyperlipidemia: Secondary | ICD-10-CM | POA: Diagnosis not present

## 2024-06-10 DIAGNOSIS — M546 Pain in thoracic spine: Secondary | ICD-10-CM | POA: Diagnosis not present

## 2024-06-10 DIAGNOSIS — R351 Nocturia: Secondary | ICD-10-CM

## 2024-06-10 DIAGNOSIS — G8929 Other chronic pain: Secondary | ICD-10-CM

## 2024-06-10 DIAGNOSIS — I10 Essential (primary) hypertension: Secondary | ICD-10-CM

## 2024-06-10 DIAGNOSIS — Z1211 Encounter for screening for malignant neoplasm of colon: Secondary | ICD-10-CM

## 2024-06-10 DIAGNOSIS — R300 Dysuria: Secondary | ICD-10-CM

## 2024-06-10 DIAGNOSIS — E663 Overweight: Secondary | ICD-10-CM

## 2024-06-10 DIAGNOSIS — Z6828 Body mass index (BMI) 28.0-28.9, adult: Secondary | ICD-10-CM

## 2024-06-10 DIAGNOSIS — Z0001 Encounter for general adult medical examination with abnormal findings: Secondary | ICD-10-CM | POA: Insufficient documentation

## 2024-06-10 LAB — URINALYSIS, ROUTINE W REFLEX MICROSCOPIC
Bilirubin, UA: NEGATIVE
Glucose, UA: NEGATIVE
Leukocytes,UA: NEGATIVE
Nitrite, UA: NEGATIVE
Protein,UA: NEGATIVE
RBC, UA: NEGATIVE
Specific Gravity, UA: 1.02 (ref 1.005–1.030)
Urobilinogen, Ur: 1 mg/dL (ref 0.2–1.0)
pH, UA: 5.5 (ref 5.0–7.5)

## 2024-06-10 LAB — MICROSCOPIC EXAMINATION
Bacteria, UA: NONE SEEN
Epithelial Cells (non renal): NONE SEEN /HPF (ref 0–10)
Renal Epithel, UA: NONE SEEN /HPF
Yeast, UA: NONE SEEN

## 2024-06-10 LAB — LIPID PANEL

## 2024-06-10 MED ORDER — SULFAMETHOXAZOLE-TRIMETHOPRIM 800-160 MG PO TABS: 1.0000 | ORAL_TABLET | Freq: Two times a day (BID) | ORAL | 0 refills | Status: AC

## 2024-06-10 NOTE — Patient Instructions (Signed)
 Gabapentin plan to 1 tab three times daily for 1-week After 1-week 1-tab twice daily Then 1-tab daily for 1-week Then 1-tab every other day until done with script

## 2024-06-11 LAB — BMP8+EGFR
BUN/Creatinine Ratio: 14 (ref 10–24)
BUN: 15 mg/dL (ref 8–27)
CO2: 20 mmol/L (ref 20–29)
Calcium: 9.6 mg/dL (ref 8.6–10.2)
Chloride: 105 mmol/L (ref 96–106)
Creatinine, Ser: 1.09 mg/dL (ref 0.76–1.27)
Glucose: 109 mg/dL — ABNORMAL HIGH (ref 70–99)
Potassium: 4.8 mmol/L (ref 3.5–5.2)
Sodium: 141 mmol/L (ref 134–144)
eGFR: 78 mL/min/1.73 (ref >59)

## 2024-06-11 LAB — CBC WITH DIFFERENTIAL/PLATELET
Basophils Absolute: 0.1 x10E3/uL (ref 0.0–0.2)
Basos: 1 %
EOS (ABSOLUTE): 0.1 x10E3/uL (ref 0.0–0.4)
Eos: 2 %
Hematocrit: 47.2 % (ref 37.5–51.0)
Hemoglobin: 15.1 g/dL (ref 13.0–17.7)
Immature Grans (Abs): 0 x10E3/uL (ref 0.0–0.1)
Immature Granulocytes: 0 %
Lymphocytes Absolute: 1.9 x10E3/uL (ref 0.7–3.1)
Lymphs: 30 %
MCH: 30 pg (ref 26.6–33.0)
MCHC: 32 g/dL (ref 31.5–35.7)
MCV: 94 fL (ref 79–97)
Monocytes Absolute: 0.5 x10E3/uL (ref 0.1–0.9)
Monocytes: 8 %
Neutrophils Absolute: 3.7 x10E3/uL (ref 1.4–7.0)
Neutrophils: 59 %
Platelets: 156 x10E3/uL (ref 150–450)
RBC: 5.04 x10E6/uL (ref 4.14–5.80)
RDW: 12.2 % (ref 11.6–15.4)
WBC: 6.2 x10E3/uL (ref 3.4–10.8)

## 2024-06-11 LAB — THYROID PANEL WITH TSH
Free Thyroxine Index: 1.3 (ref 1.2–4.9)
T3 Uptake Ratio: 30 (ref 24–39)
T4, Total: 4.4 ug/dL — AB (ref 4.5–12.0)
TSH: 1.58 u[IU]/mL (ref 0.450–4.500)

## 2024-06-11 LAB — LIPID PANEL
Cholesterol, Total: 88 mg/dL — AB (ref 100–199)
HDL: 30 mg/dL — AB (ref >39)
LDL CALC COMMENT:: 2.9 ratio (ref 0.0–5.0)
LDL Chol Calc (NIH): 43 mg/dL (ref 0–99)
Triglycerides: 67 mg/dL (ref 0–149)
VLDL Cholesterol Cal: 15 mg/dL (ref 5–40)

## 2024-06-11 LAB — PSA, TOTAL AND FREE
PSA, Free Pct: 49.2
PSA, Free: 0.59 ng/mL
Prostate Specific Ag, Serum: 1.2 ng/mL (ref 0.0–4.0)

## 2024-06-14 ENCOUNTER — Ambulatory Visit: Payer: Self-pay | Admitting: Nurse Practitioner

## 2024-06-21 ENCOUNTER — Ambulatory Visit: Payer: Self-pay | Admitting: Nurse Practitioner

## 2024-06-21 ENCOUNTER — Other Ambulatory Visit: Payer: Self-pay | Admitting: Physician Assistant

## 2024-06-21 NOTE — Telephone Encounter (Signed)
 Pt states [place] the [place]. Offered appt tomorrow with Nena but pt declined and wanted to wait till 8/20 so his daughter could come with him so left appt for then.

## 2024-06-21 NOTE — Telephone Encounter (Signed)
 FYI Only or Action Required?: FYI only for provider.  Patient was last seen in primary care on 06/10/2024 by Deitra Morton Sebastian Nena, NP.  Called Nurse Triage reporting Dizziness.  Symptoms began several weeks ago.  Triage Disposition: See PCP Within 2 Weeks  Patient/caregiver understands and will follow disposition?: Yes                      Copied from CRM [id number]. Topic: Clinical - Red Word Triage >> Jun 21, 2024  1:50 PM Turkey B wrote: Edward Fisher that prompted transfer to Nurse Triage: Patient states feels , dizzy,no energy, and lights look too bright, since wheening of gabapetin Reason for Disposition  [1] MILD dizziness (e.g., walking normally) AND [2] has been evaluated by doctor (or NP/PA) for this  Answer Assessment - Initial Assessment Questions Pt states he saw St. Morton Sebastian three weeks ago and pt started weaning off the gabapentin. Pt is not sure if this is causing his symptoms but states he had some of these symptoms before coming off of it.  DESCRIPTION: Describe your dizziness.     Swimmy headed'  LIGHTHEADED: Do you feel lightheaded? (e.g., somewhat faint, woozy, weak upon standing)     Denies  VERTIGO: Do you feel like either you or the room is spinning or tilting? (i.e., vertigo)     Denies  SEVERITY: How bad is it?  Do you feel like you are going to faint? Can you stand and walk?     Sometimes affects ability to walk; pt states he has Parkinson's as well  ONSET:  When did the dizziness begin?     Pt states a while but they have gotten worse since weaning off gabapentin  OTHER SYMPTOMS: Do you have any other symptoms? (e.g., fever, chest pain, vomiting, diarrhea, bleeding)       Intermittently things look really bright; fatigue  Protocols used: Dizziness - Lightheadedness-A-AH

## 2024-06-23 ENCOUNTER — Ambulatory Visit (INDEPENDENT_AMBULATORY_CARE_PROVIDER_SITE_OTHER): Admitting: Nurse Practitioner

## 2024-06-23 ENCOUNTER — Encounter: Payer: Self-pay | Admitting: Nurse Practitioner

## 2024-06-23 VITALS — BP 92/60 | HR 71 | Temp 98.1°F | Ht 70.0 in | Wt 190.6 lb

## 2024-06-23 DIAGNOSIS — R42 Dizziness and giddiness: Secondary | ICD-10-CM | POA: Diagnosis not present

## 2024-06-23 DIAGNOSIS — F4321 Adjustment disorder with depressed mood: Secondary | ICD-10-CM | POA: Insufficient documentation

## 2024-06-23 DIAGNOSIS — R5383 Other fatigue: Secondary | ICD-10-CM | POA: Diagnosis not present

## 2024-06-23 NOTE — Patient Instructions (Signed)
 1 can of ensure or boots daily

## 2024-06-23 NOTE — Progress Notes (Signed)
 Acute Office Visit  Subjective:     Patient ID: Edward Fisher, male    DOB: 20-Jun-1966, 58 y.o.   MRN: [id number]  Chief Complaint  Patient presents with   Dizziness    Dizziness comes and goes for 2-3 weeks    HPI Edward Fisher. Eagon is a 58 year old male who presents on 06/23/2024 for an acute visit with concerns of worsening dizziness. He was last seen on 06/10/2024 and initiated on a plan to taper off gabapentin, as he had been taking 1800 mg daily without relief. He has a history of Parkinson's disease and reports increasing forgetfulness and dizziness.  Mr. Prust notes that his dizziness is most noticeable when moving from sitting to standing. He admits to poor oral intake, stating that he has only had one bottle of water today and has not eaten yet. Typically, he consumes only two small meals per day, usually a sandwich or frozen dinner, with about 8 oz of water daily. He attributes this to difficulty coping with the recent loss of his wife, who previously managed cooking and meal preparation. He lives alone and acknowledges having a poor appetite. He has had an unintended 15-pound weight loss over the past two months. He also reports feeling socially isolated.  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   Hemiparesis of right dominant side (HCC) 05/02/2020   Weakness 03/26/2022   S/P CABG (coronary artery bypass graft) 03/19/2023   Other intervertebral disc degeneration, lumbar region 07/17/2023   Chronic thoracic back pain 06/10/2024   Encounter for general adult medical examination with abnormal findings 06/10/2024   Overweight with body mass index (BMI) of 28 to 28.9 in adult 06/10/2024    Nocturia 06/10/2024   Dysuria 06/10/2024   Dizziness on standing 06/23/2024   Lack of energy 06/23/2024   Unresolved grief 06/23/2024   Resolved Ambulatory Problems    Diagnosis Date Noted   CARDIAC MURMUR 04/23/2010   CHEST PAIN 04/23/2010   ABNORMAL ELECTROCARDIOGRAM 04/23/2010   Unstable angina (HCC) 06/23/2012   S/P CABG x 4 06/26/2012   Uncontrolled type 2 diabetes mellitus with hyperglycemia (HCC) 11/05/2017   Type 2 diabetes mellitus without complication, without long-term current use of insulin  (HCC) 05/02/2020   Hemiparesis affecting right side as late effect of cerebrovascular accident (HCC) 05/02/2020   Past Medical History:  Diagnosis Date   Insomnia    Nephrolithiasis     ROS Negative unless indicated in HPI    Objective:    BP 92/60   Pulse 71   Temp 98.1 F (36.7 C) (Temporal)   Ht 5' 10 (1.778 m)   Wt 190 lb 9.6 oz (86.5 kg)   SpO2 97%   BMI 27.35 kg/m  BP Readings from Last 3 Encounters:  06/23/24 92/60  06/10/24 100/64  04/06/24 111/71   Wt Readings from Last 3 Encounters:  06/23/24 190 lb 9.6 oz (86.5 kg)  06/10/24 195 lb 12.8 oz (88.8 kg)  04/06/24 202 lb 8 oz (91.9 kg)      Physical Exam  General: Aappears fatigued but cooperative. Flat affect.  Neuro: Alert and oriented, mild tremor noted, slow movements consistent with Parkinson's.  CV: Regular rate and rhythm, no murmurs.  Pulm: Clear to auscultation bilaterally.  Skin: No diaphoresis or pallor at rest.  Weight: 15-lb loss in 2 months  Pertinent labs & imaging results that were available during my care of the patient were reviewed by me and considered in my medical decision making.  No results found for any visits on 06/23/24.      Assessment & Plan:  Dizziness on standing -     Vitamin B12 -     VITAMIN D  25 Hydroxy (Vit-D Deficiency, Fractures)  Lack of energy -     Vitamin B12 -     VITAMIN D  25 Hydroxy (Vit-D Deficiency, Fractures)  Unresolved grief    Edward Fisher is  a 58 year old Caucasian male seen today for dizziness Orthostatic dizziness - likely multifactorial, related to Parkinson's disease, dehydration, poor nutrition, and possible orthostatic hypotension.  Parkinson's disease - ongoing management, symptoms of forgetfulness and tremor persist.  Poor nutrition/weight loss - unintended, secondary to grief, low appetite, and limited meal preparation.  Grief reaction/social isolation - recent loss of wife, limited coping strategies, decreased social support.  Medication adjustment - in process of tapering off gabapentin 1800 mg daily (previously ineffective). Lab: Vitamin D , B12 ordered result pending   1-can of Ensure daily and increase hydration  -Encourage hydration goal of 1.5-2 L daily (small frequent sips if easier).  -Recommend 3 balanced meals daily or referral to nutritionist. Discuss meal delivery options (e.g., Meals on Wheels, grocery delivery, pre-packaged healthy meals).  -Continue Parkinson's management per neurology; monitor for worsening cognitive symptoms.  -Continue gabapentin taper as previously planned.  Psychosocial/Supportive: -Grief counseling recommended; encouraged patient to speak with his pastor.  -Suggested participation in church-based widow's support group to increase social interaction and reduce isolation.  -Encouraged gradual increase in social activities (church gatherings, family, or community programs).  The above assessment and management plan was discussed with the patient. The patient verbalized understanding of and has agreed to the management plan. Patient is aware to call the clinic if they develop any new symptoms or if symptoms persist or worsen. Patient is aware when to return to the clinic for a follow-up visit. Patient educated on when it is appropriate to go to the emergency department.  Return for As already scheduled.  Maximiano Lott [place] Thompson, DNP [facility] [address] [place], [place] [address] [phone]  Note: This document was prepared by [employer] voice dictation technology and any errors that results from this process are unintentional.

## 2024-06-24 ENCOUNTER — Ambulatory Visit: Payer: Self-pay | Admitting: Nurse Practitioner

## 2024-06-24 DIAGNOSIS — E559 Vitamin D deficiency, unspecified: Secondary | ICD-10-CM

## 2024-06-24 LAB — VITAMIN B12: Vitamin B-12: 288 pg/mL (ref 232–1245)

## 2024-06-24 LAB — VITAMIN D 25 HYDROXY (VIT D DEFICIENCY, FRACTURES): Vit D, 25-Hydroxy: 23.7 ng/mL — ABNORMAL LOW (ref 30.0–100.0)

## 2024-06-24 MED ORDER — VITAMIN D3 25 MCG (1000 UT) PO CAPS: 1000.0000 [IU] | ORAL_CAPSULE | Freq: Every day | ORAL | 0 refills | Status: AC

## 2024-06-24 MED ORDER — VITAMIN D (ERGOCALCIFEROL) 1.25 MG (50000 UNIT) PO CAPS: 50000.0000 [IU] | ORAL_CAPSULE | ORAL | 0 refills | Status: DC

## 2024-06-25 ENCOUNTER — Other Ambulatory Visit: Payer: Self-pay | Admitting: Physician Assistant

## 2024-06-30 ENCOUNTER — Telehealth: Payer: Self-pay | Admitting: Nurse Practitioner

## 2024-06-30 ENCOUNTER — Other Ambulatory Visit: Payer: Self-pay | Admitting: Nurse Practitioner

## 2024-06-30 NOTE — Telephone Encounter (Signed)
 Copied from CRM [id number]. Topic: Clinical - Medication Refill >> Jun 30, 2024 10:22 AM Kevelyn M wrote: Medication: tamsulosin  (FLOMAX ) 0.4 MG CAPS capsule, metoprolol  succinate (TOPROL -XL) 25 MG 24 hr tablet  Has the patient contacted their pharmacy? No, needs to switch providers on medication (Agent: If no, request that the patient contact the pharmacy for the refill. If patient does not wish to contact the pharmacy document the reason why and proceed with request.) (Agent: If yes, when and what did the pharmacy advise?)  This is the patient's preferred pharmacy:  [id number] - [place], [place] - [address] [place] [address] Phone: [phone] Fax: [phone]  Is this the correct pharmacy for this prescription? Yes If no, delete pharmacy and type the correct one.   Has the prescription been filled recently? No  Is the patient out of the medication? Yes  Has the patient been seen for an appointment in the last year OR does the patient have an upcoming appointment? Yes  Can we respond through MyChart? No  Agent: Please be advised that Rx refills may take up to 3 business days. We ask that you follow-up with your pharmacy.

## 2024-06-30 NOTE — Telephone Encounter (Signed)
 Please call patient with update on Ortho and Colonoscopy referrals that were placed on 06/10/2024.

## 2024-07-01 NOTE — Telephone Encounter (Signed)
 Referral sent as Patient requested.  MyChart Message(s) sent to Patient with Specialty Office contact information.

## 2024-07-03 ENCOUNTER — Other Ambulatory Visit: Payer: Self-pay | Admitting: Physician Assistant

## 2024-07-06 ENCOUNTER — Other Ambulatory Visit: Payer: Self-pay

## 2024-07-06 ENCOUNTER — Telehealth: Payer: Self-pay

## 2024-07-06 MED ORDER — METOPROLOL SUCCINATE ER 25 MG PO TB24: 25.0000 mg | ORAL_TABLET | Freq: Every day | ORAL | 0 refills | Status: DC

## 2024-07-06 MED ORDER — TAMSULOSIN HCL 0.4 MG PO CAPS: 0.4000 mg | ORAL_CAPSULE | Freq: Every day | ORAL | 0 refills | Status: DC

## 2024-07-06 NOTE — Telephone Encounter (Signed)
 Pt aware and verbalized understanding.

## 2024-07-06 NOTE — Telephone Encounter (Signed)
 Copied from CRM #[id number]. Topic: Clinical - Prescription Issue >> Jul 06, 2024 11:11 AM Edward Fisher wrote: Reason for CRM: Patient is following up on status of refill request was sent on the 8/27 its showing pending please follow up with patient

## 2024-07-07 ENCOUNTER — Encounter (INDEPENDENT_AMBULATORY_CARE_PROVIDER_SITE_OTHER): Payer: Self-pay | Admitting: *Deleted

## 2024-07-15 ENCOUNTER — Ambulatory Visit: Admitting: Physical Medicine and Rehabilitation

## 2024-07-15 ENCOUNTER — Encounter: Payer: Self-pay | Admitting: Physical Medicine and Rehabilitation

## 2024-07-15 DIAGNOSIS — M546 Pain in thoracic spine: Secondary | ICD-10-CM | POA: Diagnosis not present

## 2024-07-15 DIAGNOSIS — G8191 Hemiplegia, unspecified affecting right dominant side: Secondary | ICD-10-CM

## 2024-07-15 DIAGNOSIS — G8929 Other chronic pain: Secondary | ICD-10-CM

## 2024-07-15 DIAGNOSIS — G894 Chronic pain syndrome: Secondary | ICD-10-CM | POA: Diagnosis not present

## 2024-07-15 DIAGNOSIS — Z8669 Personal history of other diseases of the nervous system and sense organs: Secondary | ICD-10-CM | POA: Diagnosis not present

## 2024-07-15 MED ORDER — TRAMADOL HCL 50 MG PO TABS: 50.0000 mg | ORAL_TABLET | Freq: Three times a day (TID) | ORAL | 0 refills | Status: DC | PRN

## 2024-07-15 NOTE — Progress Notes (Signed)
 Pain Scale   Average Pain 3 Patient advising he has middle back pain that radiates at times to upper area, when moving doing household chores, pain lessens when resting.        +Driver, -BT, -Dye Allergies.

## 2024-07-15 NOTE — Progress Notes (Signed)
 Edward Fisher - 58 y.o. male MRN [id number]  Date of birth: January 07, 1966  Office Visit Note: Visit Date: 07/15/2024 PCP: Deitra Morton Fisher Nena, NP Referred by: Deitra Morton Fisher Thurmon*  Subjective: Chief Complaint  Patient presents with   Middle Back - Pain   HPI: Edward Fisher is a 58 y.o. male who comes in today per the request of Edward Deitra Morton Sebastian, NP for evaluation of chronic, worsening and severe bilateral thoracic back pain. Pain ongoing for 5 years, worsens with movement and activity. Sitting seems to exacerbate his pain. He describes his pain as numbness sensation, currently rates as 8 out of 10. Some relief of pain with home exercise regimen, rest and use of medications. History of formal physical therapy/chiropractic treatments with minimal relief of pain. Thoracic MRI imaging from 2023 shows mild degenerative spondylosis. No acute disc extrusion or high-grade stenosis. He was previously managed by Dr. Alm Lower with [facility]. He underwent both cervical epidural steroid injection and thoracic facet joint injections with minimal relief of pain. He does have history of Parkinson's Disease that is managed by Dr. Kyung with [facility]. He was recently taken off Gabapentin by his primary care provider, he reports difficulty sleeping, plans on talking with neurology regarding this medication change. History of right sided hemiparesis. No recent trauma or falls.      Review of Systems  Musculoskeletal:  Positive for back pain and myalgias.  Neurological:  Positive for tingling, tremors and weakness.  All other systems reviewed and are negative.  Otherwise per HPI.  Assessment & Plan: Visit Diagnoses:    ICD-10-CM   1. Chronic bilateral thoracic back pain  M54.6    G89.29     2. History of Parkinson disease  Z86.69     3. Hemiparesis of right dominant side, unspecified hemiparesis etiology (HCC)  G81.91     4. Chronic pain syndrome  G89.4         Plan: Findings:  Chronic, worsening and severe bilateral thoracic back pain. Patient continues to have pain despite good conservative therapies such as physical therapy/chiropractic treatments, home exercise regimen, rest and use of medications.  Patient's clinical presentation and exam are complex.  Thoracic MRI imaging from 2023 was fairly normal, shows mild degenerative changes.  His symptoms are not consistent with thoracic MRI imaging. We discussed treatment plan in detail today. At this point, would not recommend further interventional procedures. Given chronicity of his symptoms and history of Parkinson's Disease we feel he would be better served at more comprehensive pain practice. I prescribed short course of Tramadol  for him today. Chronic right sided hemiparesis noted, he does have weakness on exam of right lower extremity, tremors to right hand, mild dragging of right foot with ambulation. He has no questions at this time. No new red flag symptoms noted upon exam today.     Meds & Orders:  Meds ordered this encounter  Medications   traMADol  (ULTRAM ) 50 MG tablet    Sig: Take 1 tablet (50 mg total) by mouth every 8 (eight) hours as needed.    Dispense:  20 tablet    Refill:  0   No orders of the defined types were placed in this encounter.   Follow-up: Return if symptoms worsen or fail to improve.   Procedures: No procedures performed      Clinical History: Narrative & Impression CLINICAL DATA:  Back pain   EXAM: THORACIC SPINE 2 VIEWS   COMPARISON:  None Available.   FINDINGS: Prior median sternotomy and CABG. Normal alignment. No fracture or focal bone lesion. Mild degenerative spurring in the mid and lower thoracic spine.   IMPRESSION: No acute bony abnormality.     Electronically Signed   By: Franky Crease M.D.   On: 06/22/2024 22:38   He reports that he has never smoked. He quit smokeless tobacco use about 28 years ago.  His smokeless tobacco use included  chew.  Recent Labs    12/04/23 0924  HGBA1C 5.6    Objective:  VS:  HT:    WT:   BMI:     BP:   HR: bpm  TEMP: ( )  RESP:  Physical Exam Vitals and nursing note reviewed.  HENT:     Head: Normocephalic and atraumatic.     Right Ear: External ear normal.     Left Ear: External ear normal.     Nose: Nose normal.     Mouth/Throat:     Mouth: Mucous membranes are moist.  Eyes:     Extraocular Movements: Extraocular movements intact.  Cardiovascular:     Rate and Rhythm: Normal rate.     Pulses: Normal pulses.  Pulmonary:     Effort: Pulmonary effort is normal.  Abdominal:     General: Abdomen is flat. There is no distension.  Musculoskeletal:        General: Tenderness present.     Cervical back: Normal range of motion.     Comments: Patient rises from seated position to standing without difficulty. Right sided hemiparesis noted. Good lumbar range of motion. No pain noted with facet loading. 4/5 strength noted with right hip flexion, knee flexion/extension, ankle dorsiflexion/plantarflexion and EHL. 5/5 strength noted with left hip flexion, knee flexion/extension, ankle dorsiflexion/plantarflexion and EHL. No clonus noted bilaterally. No pain upon palpation of greater trochanters. No pain with internal/external rotation of bilateral hips. Sensation intact bilaterally. Negative slump test bilaterally. Ambulates without aid, gait unsteady, drags right foot. Tremor noted to right hand.    Skin:    General: Skin is warm and dry.     Capillary Refill: Capillary refill takes less than 2 seconds.  Neurological:     Mental Status: He is alert and oriented to person, place, and time.     Motor: Weakness present.     Gait: Gait abnormal.  Psychiatric:        Mood and Affect: Mood normal.        Behavior: Behavior normal.     Ortho Exam  Imaging: No results found.  Past Medical/Family/Surgical/Social History: Medications & Allergies reviewed per EMR, new medications  updated. Patient Active Problem List   Diagnosis Date Noted   Dizziness on standing 06/23/2024   Lack of energy 06/23/2024   Unresolved grief 06/23/2024   Chronic thoracic back pain 06/10/2024   Encounter for general adult medical examination with abnormal findings 06/10/2024   Overweight with body mass index (BMI) of 28 to 28.9 in adult 06/10/2024   Nocturia 06/10/2024   Dysuria 06/10/2024   Other intervertebral disc degeneration, lumbar region 07/17/2023   S/P CABG (coronary artery bypass graft) 03/19/2023   Weakness 03/26/2022   Tremor of right hand 05/02/2020   Uncontrolled REM sleep behavior disorder 05/02/2020   Hemiparesis of right dominant side (HCC) 05/02/2020   Chest pain 06/22/2014   Essential hypertension 06/22/2014   Belching 06/22/2014   Local skin infection 03/16/2013   Arteriosclerotic cardiovascular disease (ASCVD) 06/26/2012   Hyperlipidemia  Past Medical History:  Diagnosis Date   Arteriosclerotic cardiovascular disease (ASCVD)    Onset in 04/2012; CABG in 06/2012 w/ LIMA-LAD, L Rad-CFX, SVG-PL-PDA   Hyperlipidemia    goal LDL < 70   Insomnia    Nephrolithiasis    Family History  Problem Relation Age of Onset   Hypertension Mother    Ulcers Father        Peptic ulcer disease   Coronary artery disease Father        Stents   Coronary artery disease Brother        CABG   Past Surgical History:  Procedure Laterality Date   CORONARY ARTERY BYPASS GRAFT  06/24/2012   Procedure: CORONARY ARTERY BYPASS GRAFTING (CABG);  Surgeon: Dallas KATHEE Jude, MD;  Location: [facility] OR;  Service: Open Heart Surgery;  Laterality: N/A;  Coronary Artery  Bypass X 4 utilizing endoscopic saphenous vein graft and  Left radial Artery Harvest    CORONARY PRESSURE/FFR STUDY N/A 11/14/2023   Procedure: CORONARY PRESSURE/FFR STUDY;  Surgeon: Wendel Lurena POUR, MD;  Location: [facility] INVASIVE [facility] LAB;  Service: Cardiovascular;  Laterality: N/A;   CYSTOSCOPY W/ RETROGRADES Left 08/23/2015    Procedure: CYSTOSCOPY WITH RETROGRADE PYELOGRAM;  Surgeon: Belvie LITTIE Clara, MD;  Location: [facility] ORS;  Service: Urology;  Laterality: Left;   CYSTOSCOPY WITH URETEROSCOPY, STONE BASKETRY AND STENT PLACEMENT Left 08/23/2015   Procedure: CYSTOSCOPY WITH URETEROSCOPY, STONE BASKET EXTRACTION;  Surgeon: Belvie LITTIE Clara, MD;  Location: [facility] ORS;  Service: Urology;  Laterality: Left;   HOLMIUM LASER APPLICATION Left 08/23/2015   Procedure: HOLMIUM LASER APPLICATION;  Surgeon: Belvie LITTIE Clara, MD;  Location: [facility] ORS;  Service: Urology;  Laterality: Left;   LEFT HEART CATH AND CORS/GRAFTS ANGIOGRAPHY N/A 11/14/2023   Procedure: LEFT HEART CATH AND CORS/GRAFTS ANGIOGRAPHY;  Surgeon: Wendel Lurena POUR, MD;  Location: [facility] LAB;  Service: Cardiovascular;  Laterality: N/A;   LEFT HEART CATHETERIZATION WITH CORONARY ANGIOGRAM N/A 06/23/2012   Procedure: LEFT HEART CATHETERIZATION WITH CORONARY ANGIOGRAM;  Surgeon: Ozell Fell, MD;  Location: [facility] CATH LAB;  Service: Cardiovascular;  Laterality: N/A;   RHINOPLASTY     TONSILLECTOMY  ~1971   Social History   Occupational History   Occupation: Therapist, 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

## 2024-08-03 ENCOUNTER — Encounter: Payer: Self-pay | Admitting: Gastroenterology

## 2024-08-05 ENCOUNTER — Other Ambulatory Visit: Payer: Self-pay | Admitting: Nurse Practitioner

## 2024-08-10 ENCOUNTER — Encounter: Payer: Self-pay | Admitting: Physical Medicine & Rehabilitation

## 2024-08-11 ENCOUNTER — Other Ambulatory Visit: Payer: Self-pay | Admitting: Physical Medicine and Rehabilitation

## 2024-09-06 ENCOUNTER — Encounter: Payer: Self-pay | Admitting: Radiology

## 2024-09-07 ENCOUNTER — Encounter: Attending: Physical Medicine & Rehabilitation | Admitting: Physical Medicine & Rehabilitation

## 2024-09-07 ENCOUNTER — Encounter: Payer: Self-pay | Admitting: Physical Medicine & Rehabilitation

## 2024-09-07 VITALS — BP 113/73 | HR 85 | Ht 70.0 in | Wt 190.0 lb

## 2024-09-07 DIAGNOSIS — M546 Pain in thoracic spine: Secondary | ICD-10-CM | POA: Insufficient documentation

## 2024-09-07 DIAGNOSIS — G8929 Other chronic pain: Secondary | ICD-10-CM | POA: Insufficient documentation

## 2024-09-07 DIAGNOSIS — S29019A Strain of muscle and tendon of unspecified wall of thorax, initial encounter: Secondary | ICD-10-CM | POA: Diagnosis present

## 2024-09-07 NOTE — Patient Instructions (Signed)
  VISIT SUMMARY: Today, we discussed your ongoing chronic back pain, particularly around your shoulder blade, and reviewed your previous treatments and their limited effectiveness.  YOUR PLAN: CHRONIC THORACIC BACK PAIN: You have persistent back pain in the thoracic region, which worsens with arm movements and is relieved by rest. Previous treatments have not provided significant relief. -We administered a lidocaine  injection to see if it helps relieve your pain. -If the lidocaine  injection is effective, we will consider using a lidocaine  patch. You may need assistance from a family member to apply it. -If the lidocaine  patch is not feasible or effective, we will discuss starting pregabalin as an alternative treatment.                      Contains text generated by Abridge.                                 Contains text generated by Abridge.

## 2024-09-07 NOTE — Progress Notes (Signed)
 Subjective:    Patient ID: Edward Fisher, male    DOB: 1965/12/13, 58 y.o.   MRN: [id number] Discussed the use of AI scribe software for clinical note transcription with the patient, who gave verbal consent to proceed.  History of Present Illness Edward Fisher is a 58 year old male with chronic back pain who presents for evaluation of persistent pain despite previous treatments.  He has chronic back pain primarily in the thoracic region, radiating around the shoulder blade area. The pain worsens with activities involving arm movements, such as folding clothes and washing dishes, and is alleviated by resting or reclining. It does not typically disturb his sleep, although he occasionally experiences hip pain, which he attributes to arthritis.  He has undergone various treatments for his back pain, including cervical epidural steroid injections at C[facility]-T[facility] in January 2023 and thoracic facet joint injections in 2022, which provided limited relief. Physical therapy over a year ago was not beneficial. Medications such as tramadol  and gabapentin were ineffective, with gabapentin being used for two to three years at a dose of six pills per day before discontinuation due to lack of efficacy.  He has not tried topical treatments like [facility] or [place] due to difficulty applying them himself. His wife, who used to assist him, has passed away, and he lives with a small dog who is unable to help. He has not experienced significant relief from any of the treatments tried so far.  He has Parkinson's disease, which affects his balance. No significant pain with head movements.   HPI Pain Inventory Average Pain 9 Pain Right Now 2 My pain is burning  In the last 24 hours, has pain interfered with the following? General activity 5 Relation with others 0 Enjoyment of life 2 What TIME of day is your pain at its worst? daytime and night Sleep (in general) Fair  Pain is worse with: some activites Pain improves  with: rest Relief from Meds: .  how many minutes can you walk? 30 ability to climb steps?  yes do you drive?  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  Tobacco Use   Smoking status: Never   Smokeless tobacco: Former    Types: Chew    Quit date: 1997   Tobacco comments:    06/22/2012 quit chewing 10-15 years ago  Vaping Use   Vaping status: Never Used  Substance and Sexual Activity   Alcohol use: No    Alcohol/week: 0.0 standard drinks of alcohol   Drug use: No   Sexual activity: Yes  Other Topics Concern   Not on file  Social History Narrative   Regular exercise: No   Social Drivers of  Health   Financial Resource Strain: Low Risk  (12/09/2023)   Received from [facility]   Overall Financial Resource Strain (CARDIA)    Difficulty of Paying Living Expenses: Not very hard  Food Insecurity: No Food Insecurity (12/09/2023)   Received from [facility]   Hunger Vital Sign    Within the past 12 months, you worried that your food would run out before you got the money to buy more.: Never true    Within the past 12 months, the food you bought just didn't last and you didn't have money to get more.: Never true  Transportation Needs: No Transportation Needs (12/09/2023)   Received from [facility]    Lack of Transportation (Medical): No    Lack of  Transportation (Non-Medical): No  Physical Activity: Not on file  Stress: Not on file  Social Connections: Unknown (03/19/2022)   Received from [facility]   Social Network    Social Network: Not on file   Past Surgical History:  Procedure Laterality Date   CORONARY ARTERY BYPASS GRAFT  06/24/2012   Procedure: CORONARY ARTERY BYPASS GRAFTING (CABG);  Surgeon: Dallas KATHEE Jude, MD;  Location: [facility] OR;  Service: Open Heart Surgery;  Laterality: N/A;  Coronary Artery  Bypass X 4 utilizing endoscopic saphenous vein graft and  Left radial Artery Harvest    CORONARY PRESSURE/FFR STUDY N/A 11/14/2023   Procedure: CORONARY PRESSURE/FFR STUDY;  Surgeon: Wendel Lurena POUR, MD;  Location: [facility] LAB;  Service: Cardiovascular;  Laterality: N/A;   CYSTOSCOPY W/ RETROGRADES Left 08/23/2015   Procedure: CYSTOSCOPY WITH RETROGRADE PYELOGRAM;  Surgeon: Belvie LITTIE Clara, MD;  Location: [facility];  Service: Urology;  Laterality: Left;   CYSTOSCOPY WITH URETEROSCOPY, STONE BASKETRY AND STENT PLACEMENT Left 08/23/2015   Procedure: CYSTOSCOPY WITH URETEROSCOPY, STONE BASKET EXTRACTION;  Surgeon: Belvie LITTIE Clara, MD;  Location: [facility];  Service: Urology;  Laterality: Left;   HOLMIUM LASER APPLICATION Left 08/23/2015   Procedure: HOLMIUM LASER APPLICATION;  Surgeon: Belvie LITTIE Clara, MD;  Location: [facility];  Service: Urology;  Laterality: Left;   LEFT HEART CATH AND CORS/GRAFTS ANGIOGRAPHY N/A 11/14/2023   Procedure: LEFT HEART CATH AND CORS/GRAFTS ANGIOGRAPHY;  Surgeon: Wendel Lurena POUR, MD;  Location: [facility];  Service: Cardiovascular;  Laterality: N/A;   LEFT HEART CATHETERIZATION WITH CORONARY ANGIOGRAM N/A 06/23/2012   Procedure: LEFT HEART CATHETERIZATION WITH CORONARY ANGIOGRAM;  Surgeon: Ozell Fell, MD;  Location: [facility] CATH LAB;  Service: Cardiovascular;  Laterality: N/A;   RHINOPLASTY     TONSILLECTOMY  ~1971   Past Medical History:  Diagnosis Date   Arteriosclerotic cardiovascular  disease (ASCVD)    Onset in 04/2012; CABG in 06/2012 w/ LIMA-LAD, L Rad-CFX, SVG-PL-PDA   Hyperlipidemia    goal LDL < 70   Insomnia    Nephrolithiasis    BP 113/73   Pulse 85   Ht 5' 10 (1.778 m)   Wt 190 lb (86.2 kg)   SpO2 98%   BMI 27.26 kg/m   Opioid Risk Score:   Fall Risk Score:  `1  Depression screen [facility] 2/9     09/07/2024    2:29 PM 06/10/2024    9:04 AM 07/23/2012   12:59 PM  Depression screen PHQ 2/9  Decreased Interest 1 1 0  Down, Depressed, Hopeless 0 1 0  PHQ - 2 Score 1 2 0   Altered sleeping 1 0   Tired, decreased energy 3 0  Change in appetite 0 0   Feeling bad or failure about yourself  0 0   Trouble concentrating 1 0   Moving slowly or fidgety/restless 1 0   Suicidal thoughts 0 0   PHQ-9 Score 7 2   Difficult doing work/chores Somewhat difficult Not difficult at all      Data saved with a previous flowsheet row definition      Review of Systems  Musculoskeletal:  Positive for back pain.  All other systems reviewed and are negative.      Objective:   Physical Exam  General no acute distress Appropriate Extremities without edema Motor strength is 5/5 bilateral deltoid plus tricep grip Sensation normal bilateral extremities No pain with cervical spine range of motion of flexion extension lateral bending and rotation There is tenderness around the 3cm lipoma right periscapular area around T8      Assessment & Plan:   Assessment and Plan Assessment & Plan Chronic thoracic back pain possibly related to post-surgical changes and lipoma, according the patient the daughter is his symptoms started after lipoma resection.  The lipoma did return after resection. Chronic thoracic back pain around the shoulder blade, worsened by arm movement. Previous treatments ineffective. Pain relieved by rest, not affecting sleep. Differential includes nerve pain from lipoma or surgical scarring. Considered pregabalin due to gabapentin ineffectiveness, mindful  of cognitive side effects with Parkinson's. - Administer lidocaine  injection to assess pain relief. - Consider lidocaine  patch if injection effective, with family assistance for application. - Discuss pregabalin if lidocaine  patch not feasible or effective.    Trigger point injection  Informed consent was obtained after describing risks and benefits of the procedure.  The area just inferior to the lipoma corresponding to lower trapezius musculature around T8 just inferior to the scapula was marked prepped with Betadine infiltrated with 1 cc of 1% lidocaine  at each of 5 spots in a semicircular fashion.  30-gauge 1/2 inch needle was utilized. Patient tolerated procedure well Postprocedure instructions given Discussed trial of lidocaine  patch to the area to be worn during the day when he is most symptomatic

## 2024-09-08 ENCOUNTER — Encounter: Payer: Self-pay | Admitting: *Deleted

## 2024-09-08 ENCOUNTER — Ambulatory Visit: Admitting: Gastroenterology

## 2024-09-08 ENCOUNTER — Other Ambulatory Visit: Payer: Self-pay | Admitting: *Deleted

## 2024-09-08 ENCOUNTER — Encounter: Payer: Self-pay | Admitting: Gastroenterology

## 2024-09-08 ENCOUNTER — Telehealth: Payer: Self-pay | Admitting: Gastroenterology

## 2024-09-08 VITALS — BP 117/68 | HR 82 | Temp 98.7°F | Ht 70.0 in | Wt 192.0 lb

## 2024-09-08 DIAGNOSIS — Z1211 Encounter for screening for malignant neoplasm of colon: Secondary | ICD-10-CM | POA: Diagnosis not present

## 2024-09-08 DIAGNOSIS — K5900 Constipation, unspecified: Secondary | ICD-10-CM | POA: Diagnosis not present

## 2024-09-08 DIAGNOSIS — K760 Fatty (change of) liver, not elsewhere classified: Secondary | ICD-10-CM

## 2024-09-08 MED ORDER — PEG 3350-KCL-NA BICARB-NACL 420 G PO SOLR: 4000.0000 mL | Freq: Once | ORAL | 0 refills | Status: AC

## 2024-09-08 NOTE — Patient Instructions (Signed)
 Start Miralax one capful mixed in 6 ounces of water THREE times daily until you have a soft stool. Then you can reduce your dose to one or two capfuls daily as needed to maintain regular soft stools.   If your stools do not improve, please let me know. We need your stools more regular before your procedure.  Colonoscopy to be scheduled. Separate instructions to be provided.  I will look through your older records to see if any evidence of fatty liver and let you know if additional work up is needed.

## 2024-09-08 NOTE — Telephone Encounter (Signed)
 Tammy, please let patient know that reviewing his previous records as mentioned today, he had fatty liver noted on 2007 ultrasound. The CT for kidney stones done in 2022 was without contrast (not best for liver), liver looked ok then.   Would offer him updated abdominal u/s to evaluate for fatty liver

## 2024-09-08 NOTE — Telephone Encounter (Signed)
 Pt was made aware and is agreeable with us  to evaluate for fatty liver. Pt is ready to move forward with scheduling.

## 2024-09-08 NOTE — Progress Notes (Signed)
 GI Office Note    Referring Provider: Deitra Morton Hummer, Thurmon* Primary Care Physician:  Deitra Morton Hummer Nena, NP  Primary Gastroenterologist: Ozell Hollingshead, MD  Chief Complaint   Chief Complaint  Patient presents with   Colonoscopy    Has issues with constipation.     History of Present Illness   Edward Fisher is a 58 y.o. male presenting today to schedule screening colonoscopy. Last colonoscopy at age 70. Patient reports some constipation, felt to be caused by medication.   Discussed the use of AI scribe software for clinical note transcription with the patient, who gave verbal consent to proceed.   He has experienced constipation for years, but recently seemed to be worse after adding amitriptyline. Bowel movements occurring only twice a week. Stools are initially hard but become softer. When he goes, he does pass large amount of stool. This week he felt like he had to strain to get stool started. No blood in the stool, but he reports occasional abdominal cramping before bowel movements.  He has had return of appetite about 3 weeks ago. Notes that for about a month he suffered from loss of appetite, lost about 20 pounds. He has gained six pounds back. He attributes to depression. He grieves loss of wife about five years ago from Covid. She was a liver transplant survivor (NASH) doing well until Covid.     Denies heartburn, vomiting, dysphagia, melena, brbpr.   Prior Data   Colonoscopy 2007  1. Friable anal canal/anal canal hemorrhoids, otherwise normal rectum.  2. Left sided diverticula, remainder of colonic mucosa appeared normal,      suspect patient has bled from the anorectum secondary to benign      process.  Medications   Current Outpatient Medications  Medication Sig Dispense Refill   aspirin  81 MG chewable tablet Chew 81 mg by mouth every evening. TO START DOSAGE IN 6 MONTHS APPROXIMATELY July 2014     atorvastatin  (LIPITOR ) 40 MG tablet Take 1 tablet by  mouth once daily 30 tablet 1   carbidopa-levodopa (SINEMET IR) 25-100 MG tablet Take 6 tablets by mouth See admin instructions. Take 1 tablet in the morning and lunch, Then 2 tables at 1600 and 1 tablet at 2000 and 1 tablet at bedtime     [START ON 09/24/2024] Cholecalciferol (VITAMIN D3) 25 MCG (1000 UT) CAPS Take 1 capsule (1,000 Units total) by mouth daily. 90 capsule 0   isosorbide  mononitrate (IMDUR ) 30 MG 24 hr tablet Take 1 tablet (30 mg total) by mouth daily. 90 tablet 0   metoprolol  succinate (TOPROL -XL) 25 MG 24 hr tablet Take 1 tablet by mouth once daily 90 tablet 0   nitroGLYCERIN  (NITROSTAT ) 0.4 MG SL tablet Place 1 tablet (0.4 mg total) under the tongue every 5 (five) minutes as needed for chest pain. 25 tablet PRN   nortriptyline (PAMELOR) 10 MG capsule Take 10 mg by mouth at bedtime. Take 3 capsules at bedtime     Omega-3 Fatty Acids (FISH OIL) 1000 MG CAPS Take 1,000 mg by mouth daily.     tamsulosin  (FLOMAX ) 0.4 MG CAPS capsule Take 1 capsule by mouth once daily 90 capsule 0   No current facility-administered medications for this visit.    Allergies   Allergies as of 09/08/2024 - Review Complete 09/08/2024  Allergen Reaction Noted   Prozac  [fluoxetine ] Other (See Comments) 09/12/2020   Pollen extract Cough 08/15/2015    Past Medical History   Past Medical History:  Diagnosis Date   Arteriosclerotic cardiovascular disease (ASCVD)    Onset in 04/2012; CABG in 06/2012 w/ LIMA-LAD, L Rad-CFX, SVG-PL-PDA   Hyperlipidemia    goal LDL < 70   Insomnia    Nephrolithiasis    Parkinson's disease (HCC)     Past Surgical History   Past Surgical History:  Procedure Laterality Date   CORONARY ARTERY BYPASS GRAFT  06/24/2012   Procedure: CORONARY ARTERY BYPASS GRAFTING (CABG);  Surgeon: Dallas KATHEE Jude, MD;  Location: [facility] OR;  Service: Open Heart Surgery;  Laterality: N/A;  Coronary Artery  Bypass X 4 utilizing endoscopic saphenous vein graft and  Left radial Artery Harvest     CORONARY PRESSURE/FFR STUDY N/A 11/14/2023   Procedure: CORONARY PRESSURE/FFR STUDY;  Surgeon: Wendel Lurena POUR, MD;  Location: [facility];  Service: Cardiovascular;  Laterality: N/A;   CYSTOSCOPY W/ RETROGRADES Left 08/23/2015   Procedure: CYSTOSCOPY WITH RETROGRADE PYELOGRAM;  Surgeon: Belvie LITTIE Clara, MD;  Location: [facility];  Service: Urology;  Laterality: Left;   CYSTOSCOPY WITH URETEROSCOPY, STONE BASKETRY AND STENT PLACEMENT Left 08/23/2015   Procedure: CYSTOSCOPY WITH URETEROSCOPY, STONE BASKET EXTRACTION;  Surgeon: Belvie LITTIE Clara, MD;  Location: [facility];  Service: Urology;  Laterality: Left;   HOLMIUM LASER APPLICATION Left 08/23/2015   Procedure: HOLMIUM LASER APPLICATION;  Surgeon: Belvie LITTIE Clara, MD;  Location: [facility];  Service: Urology;  Laterality: Left;   LEFT HEART CATH AND CORS/GRAFTS ANGIOGRAPHY N/A 11/14/2023   Procedure: LEFT HEART CATH AND CORS/GRAFTS ANGIOGRAPHY;  Surgeon: Wendel Lurena POUR, MD;  Location: [facility];  Service: Cardiovascular;  Laterality: N/A;   LEFT HEART CATHETERIZATION WITH CORONARY ANGIOGRAM N/A 06/23/2012   Procedure: LEFT HEART CATHETERIZATION WITH CORONARY ANGIOGRAM;  Surgeon: Ozell Fell, MD;  Location: [facility] CATH LAB;  Service: Cardiovascular;  Laterality: N/A;   RHINOPLASTY     TONSILLECTOMY  ~1971    Past Family History   Family History  Problem Relation Age of Onset   Hypertension Mother    Ulcers Father        Peptic ulcer disease   Coronary artery disease Father        Stents   Coronary artery disease Brother        CABG   Colon cancer Neg Hx    Liver disease Neg Hx     Past Social History   Social History   Socioeconomic History   Marital status: Widowed    Spouse name: Not on file   Number of children: Not on file   Years of education: Not on file   Highest education level: Not on file  Occupational History   Occupation: Therapist, 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  Tobacco Use   Smoking status: Never   Smokeless tobacco: Former    Types: Chew    Quit date: 1997   Tobacco comments:    06/22/2012 quit chewing 10-15 years ago  Vaping Use   Vaping status: Never Used  Substance and Sexual Activity   Alcohol use: No    Alcohol/week: 0.0 standard drinks of alcohol   Drug use: No   Sexual activity: Yes  Other Topics Concern   Not on file  Social History Narrative   Regular exercise: No   Social Drivers of Corporate Investment Banker Strain: Low Risk  (12/09/2023)   Received from [facility]   Overall Financial Resource Strain (CARDIA)    Difficulty of Paying  Living Expenses: Not very hard  Food Insecurity: No Food Insecurity (12/09/2023)   Received from [facility]   Hunger Vital Sign    Within the past 12 months, you worried that your food would run out before you got the money to buy more.: Never true    Within the past 12 months, the food you bought just didn't last and you didn't have money to get more.: Never true  Transportation Needs: No Transportation Needs (12/09/2023)   Received from [facility] - [facility]    Lack of Transportation (Medical): No    Lack of Transportation (Non-Medical): No  Physical Activity: Not on file  Stress: Not on file  Social Connections: Unknown (03/19/2022)   Received from [facility]   Social Network    Social Network: Not on file  Intimate Partner Violence: Unknown (02/08/2022)   Received from [facility]   HITS    Physically Hurt: Not on file    Insult or Talk Down To: Not on file    Threaten Physical Harm: Not on file    Scream or Curse: Not on file    Review of Systems   General: Negative for anorexia, weight loss, fever, chills, fatigue, weakness. Eyes: Negative for vision changes.  ENT: Negative for hoarseness, difficulty swallowing , nasal congestion. CV: Negative for chest pain, angina, palpitations, dyspnea on exertion, peripheral edema.  Respiratory:  Negative for dyspnea at rest, dyspnea on exertion, cough, sputum, wheezing.  GI: See history of present illness. GU:  Negative for dysuria, hematuria, urinary incontinence, urinary frequency, nocturnal urination.  MS: Negative for joint pain, low back pain.  Derm: Negative for rash or itching.  Neuro: Negative for weakness, abnormal sensation, seizure, frequent headaches, memory loss,  confusion.  Psych: Negative for anxiety, depression, suicidal ideation, hallucinations.  Endo: Negative for unusual weight change.  Heme: Negative for bruising or bleeding. Allergy: Negative for rash or hives.  Physical Exam   BP 117/68 (BP Location: Right Arm, Patient Position: Sitting, Cuff Size: Normal)   Pulse 82   Temp 98.7 F (37.1 C) (Oral)   Ht 5' 10 (1.778 m)   Wt 192 lb (87.1 kg)   SpO2 97%   BMI 27.55 kg/m    General: Well-nourished, well-developed in no acute distress.  Head: Normocephalic, atraumatic.   Eyes: Conjunctiva pink, no icterus. Mouth: Oropharyngeal mucosa moist and pink  Neck: Supple without thyromegaly, masses, or lymphadenopathy.  Lungs: Clear to auscultation bilaterally.  Heart: Regular rate and rhythm, no murmurs rubs or gallops.  Abdomen: Bowel sounds are normal, nontender, nondistended, no hepatosplenomegaly or masses,  no abdominal bruits or hernia, no rebound or guarding.   Rectal: not performed Extremities: No lower extremity edema. No clubbing or deformities.  Neuro: Alert and oriented x 4 , grossly normal neurologically.  Skin: Warm and dry, no rash or jaundice.   Psych: Alert and cooperative, normal mood and affect.  Labs   Lab Results  Component Value Date   VITAMINB12 288 06/23/2024   Lab Results  Component Value Date   NA 141 06/10/2024   CL 105 06/10/2024   K 4.8 06/10/2024   CO2 20 06/10/2024   BUN 15 06/10/2024   CREATININE 1.09 06/10/2024   EGFR 78 06/10/2024   CALCIUM  9.6 06/10/2024   ALBUMIN  4.1 12/04/2023   GLUCOSE 109 (H) 06/10/2024    Lab Results  Component Value Date   ALT 15 12/04/2023   AST 14 (L) 12/04/2023   ALKPHOS 67 12/04/2023  BILITOT 0.7 12/04/2023   Lab Results  Component Value Date   WBC 6.2 06/10/2024   HGB 15.1 06/10/2024   HCT 47.2 06/10/2024   MCV 94 06/10/2024   PLT 156 06/10/2024   Lab Results  Component Value Date   TSH 1.580 06/10/2024    Imaging Studies   No results found.  Assessment/Plan:    Chronic constipation Poorly managed, recently exacerbated by medication, seems to be worse after amitriptyline.  -start miralax 17 grams three times daily until soft stool, then continue 1-2 times daily to maintain soft regular stools. Call if not effective.  -drink adequate fluids  -increase dietary fiber  Screening colonoscopy -colonoscopy with Dr. Shaaron. ASA 3. Rm 1,2 ok. I thoroughly discussed with the patient the procedure, including the risks involved. Patient understands what the procedure involves including the benefits and any risks. Patient understands alternatives to the proposed procedure. Risks including (but not limited to) bleeding, tearing of the lining (perforation), rupture of adjacent organs, problems with heart and lung function, infection, and medication reactions. A small percentage of complications may require surgery, hospitalization, repeat endoscopic procedure, and/or transfusion. Patient understood and agreed.   Fatty liver on remote ultrasound 2007: -CT without contrast with unremarkable liver in 2022 -FIB 4 favorable, appears to exclude advanced fibrosis -would offer follow up abdominal ultrasound      Edward Fisher. Ezzard, MHS, PA-C [facility]

## 2024-09-08 NOTE — Telephone Encounter (Signed)
 Pt informed that US  is scheduled for Tuesday 09/14/24, arrive at 10:15 am to check in at [facility] radiology. NPO 6-8 hours prior.

## 2024-09-08 NOTE — H&P (View-Only) (Signed)
 GI Office Note    Referring Provider: Deitra Morton Hummer, Thurmon* Primary Care Physician:  Deitra Morton Hummer Nena, NP  Primary Gastroenterologist: Ozell Hollingshead, MD  Chief Complaint   Chief Complaint  Patient presents with   Colonoscopy    Has issues with constipation.     History of Present Illness   Edward Fisher is a 58 y.o. male presenting today to schedule screening colonoscopy. Last colonoscopy at age 70. Patient reports some constipation, felt to be caused by medication.   Discussed the use of AI scribe software for clinical note transcription with the patient, who gave verbal consent to proceed.   He has experienced constipation for years, but recently seemed to be worse after adding amitriptyline. Bowel movements occurring only twice a week. Stools are initially hard but become softer. When he goes, he does pass large amount of stool. This week he felt like he had to strain to get stool started. No blood in the stool, but he reports occasional abdominal cramping before bowel movements.  He has had return of appetite about 3 weeks ago. Notes that for about a month he suffered from loss of appetite, lost about 20 pounds. He has gained six pounds back. He attributes to depression. He grieves loss of wife about five years ago from Covid. She was a liver transplant survivor (NASH) doing well until Covid.     Denies heartburn, vomiting, dysphagia, melena, brbpr.   Prior Data   Colonoscopy 2007  1. Friable anal canal/anal canal hemorrhoids, otherwise normal rectum.  2. Left sided diverticula, remainder of colonic mucosa appeared normal,      suspect patient has bled from the anorectum secondary to benign      process.  Medications   Current Outpatient Medications  Medication Sig Dispense Refill   aspirin  81 MG chewable tablet Chew 81 mg by mouth every evening. TO START DOSAGE IN 6 MONTHS APPROXIMATELY July 2014     atorvastatin  (LIPITOR ) 40 MG tablet Take 1 tablet by  mouth once daily 30 tablet 1   carbidopa-levodopa (SINEMET IR) 25-100 MG tablet Take 6 tablets by mouth See admin instructions. Take 1 tablet in the morning and lunch, Then 2 tables at 1600 and 1 tablet at 2000 and 1 tablet at bedtime     [START ON 09/24/2024] Cholecalciferol (VITAMIN D3) 25 MCG (1000 UT) CAPS Take 1 capsule (1,000 Units total) by mouth daily. 90 capsule 0   isosorbide  mononitrate (IMDUR ) 30 MG 24 hr tablet Take 1 tablet (30 mg total) by mouth daily. 90 tablet 0   metoprolol  succinate (TOPROL -XL) 25 MG 24 hr tablet Take 1 tablet by mouth once daily 90 tablet 0   nitroGLYCERIN  (NITROSTAT ) 0.4 MG SL tablet Place 1 tablet (0.4 mg total) under the tongue every 5 (five) minutes as needed for chest pain. 25 tablet PRN   nortriptyline (PAMELOR) 10 MG capsule Take 10 mg by mouth at bedtime. Take 3 capsules at bedtime     Omega-3 Fatty Acids (FISH OIL) 1000 MG CAPS Take 1,000 mg by mouth daily.     tamsulosin  (FLOMAX ) 0.4 MG CAPS capsule Take 1 capsule by mouth once daily 90 capsule 0   No current facility-administered medications for this visit.    Allergies   Allergies as of 09/08/2024 - Review Complete 09/08/2024  Allergen Reaction Noted   Prozac  [fluoxetine ] Other (See Comments) 09/12/2020   Pollen extract Cough 08/15/2015    Past Medical History   Past Medical History:  Diagnosis Date   Arteriosclerotic cardiovascular disease (ASCVD)    Onset in 04/2012; CABG in 06/2012 w/ LIMA-LAD, L Rad-CFX, SVG-PL-PDA   Hyperlipidemia    goal LDL < 70   Insomnia    Nephrolithiasis    Parkinson's disease (HCC)     Past Surgical History   Past Surgical History:  Procedure Laterality Date   CORONARY ARTERY BYPASS GRAFT  06/24/2012   Procedure: CORONARY ARTERY BYPASS GRAFTING (CABG);  Surgeon: Dallas KATHEE Jude, MD;  Location: [facility] OR;  Service: Open Heart Surgery;  Laterality: N/A;  Coronary Artery  Bypass X 4 utilizing endoscopic saphenous vein graft and  Left radial Artery Harvest     CORONARY PRESSURE/FFR STUDY N/A 11/14/2023   Procedure: CORONARY PRESSURE/FFR STUDY;  Surgeon: Wendel Lurena POUR, MD;  Location: [facility];  Service: Cardiovascular;  Laterality: N/A;   CYSTOSCOPY W/ RETROGRADES Left 08/23/2015   Procedure: CYSTOSCOPY WITH RETROGRADE PYELOGRAM;  Surgeon: Belvie LITTIE Clara, MD;  Location: [facility];  Service: Urology;  Laterality: Left;   CYSTOSCOPY WITH URETEROSCOPY, STONE BASKETRY AND STENT PLACEMENT Left 08/23/2015   Procedure: CYSTOSCOPY WITH URETEROSCOPY, STONE BASKET EXTRACTION;  Surgeon: Belvie LITTIE Clara, MD;  Location: [facility];  Service: Urology;  Laterality: Left;   HOLMIUM LASER APPLICATION Left 08/23/2015   Procedure: HOLMIUM LASER APPLICATION;  Surgeon: Belvie LITTIE Clara, MD;  Location: [facility];  Service: Urology;  Laterality: Left;   LEFT HEART CATH AND CORS/GRAFTS ANGIOGRAPHY N/A 11/14/2023   Procedure: LEFT HEART CATH AND CORS/GRAFTS ANGIOGRAPHY;  Surgeon: Wendel Lurena POUR, MD;  Location: [facility];  Service: Cardiovascular;  Laterality: N/A;   LEFT HEART CATHETERIZATION WITH CORONARY ANGIOGRAM N/A 06/23/2012   Procedure: LEFT HEART CATHETERIZATION WITH CORONARY ANGIOGRAM;  Surgeon: Ozell Fell, MD;  Location: [facility] CATH LAB;  Service: Cardiovascular;  Laterality: N/A;   RHINOPLASTY     TONSILLECTOMY  ~1971    Past Family History   Family History  Problem Relation Age of Onset   Hypertension Mother    Ulcers Father        Peptic ulcer disease   Coronary artery disease Father        Stents   Coronary artery disease Brother        CABG   Colon cancer Neg Hx    Liver disease Neg Hx     Past Social History   Social History   Socioeconomic History   Marital status: Widowed    Spouse name: Not on file   Number of children: Not on file   Years of education: Not on file   Highest education level: Not on file  Occupational History   Occupation: Therapist, 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  Tobacco Use   Smoking status: Never   Smokeless tobacco: Former    Types: Chew    Quit date: 1997   Tobacco comments:    06/22/2012 quit chewing 10-15 years ago  Vaping Use   Vaping status: Never Used  Substance and Sexual Activity   Alcohol use: No    Alcohol/week: 0.0 standard drinks of alcohol   Drug use: No   Sexual activity: Yes  Other Topics Concern   Not on file  Social History Narrative   Regular exercise: No   Social Drivers of Corporate Investment Banker Strain: Low Risk  (12/09/2023)   Received from [facility]   Overall Financial Resource Strain (CARDIA)    Difficulty of Paying  Living Expenses: Not very hard  Food Insecurity: No Food Insecurity (12/09/2023)   Received from [facility]   Hunger Vital Sign    Within the past 12 months, you worried that your food would run out before you got the money to buy more.: Never true    Within the past 12 months, the food you bought just didn't last and you didn't have money to get more.: Never true  Transportation Needs: No Transportation Needs (12/09/2023)   Received from [facility] - [facility]    Lack of Transportation (Medical): No    Lack of Transportation (Non-Medical): No  Physical Activity: Not on file  Stress: Not on file  Social Connections: Unknown (03/19/2022)   Received from [facility]   Social Network    Social Network: Not on file  Intimate Partner Violence: Unknown (02/08/2022)   Received from [facility]   HITS    Physically Hurt: Not on file    Insult or Talk Down To: Not on file    Threaten Physical Harm: Not on file    Scream or Curse: Not on file    Review of Systems   General: Negative for anorexia, weight loss, fever, chills, fatigue, weakness. Eyes: Negative for vision changes.  ENT: Negative for hoarseness, difficulty swallowing , nasal congestion. CV: Negative for chest pain, angina, palpitations, dyspnea on exertion, peripheral edema.  Respiratory:  Negative for dyspnea at rest, dyspnea on exertion, cough, sputum, wheezing.  GI: See history of present illness. GU:  Negative for dysuria, hematuria, urinary incontinence, urinary frequency, nocturnal urination.  MS: Negative for joint pain, low back pain.  Derm: Negative for rash or itching.  Neuro: Negative for weakness, abnormal sensation, seizure, frequent headaches, memory loss,  confusion.  Psych: Negative for anxiety, depression, suicidal ideation, hallucinations.  Endo: Negative for unusual weight change.  Heme: Negative for bruising or bleeding. Allergy: Negative for rash or hives.  Physical Exam   BP 117/68 (BP Location: Right Arm, Patient Position: Sitting, Cuff Size: Normal)   Pulse 82   Temp 98.7 F (37.1 C) (Oral)   Ht 5' 10 (1.778 m)   Wt 192 lb (87.1 kg)   SpO2 97%   BMI 27.55 kg/m    General: Well-nourished, well-developed in no acute distress.  Head: Normocephalic, atraumatic.   Eyes: Conjunctiva pink, no icterus. Mouth: Oropharyngeal mucosa moist and pink  Neck: Supple without thyromegaly, masses, or lymphadenopathy.  Lungs: Clear to auscultation bilaterally.  Heart: Regular rate and rhythm, no murmurs rubs or gallops.  Abdomen: Bowel sounds are normal, nontender, nondistended, no hepatosplenomegaly or masses,  no abdominal bruits or hernia, no rebound or guarding.   Rectal: not performed Extremities: No lower extremity edema. No clubbing or deformities.  Neuro: Alert and oriented x 4 , grossly normal neurologically.  Skin: Warm and dry, no rash or jaundice.   Psych: Alert and cooperative, normal mood and affect.  Labs   Lab Results  Component Value Date   VITAMINB12 288 06/23/2024   Lab Results  Component Value Date   NA 141 06/10/2024   CL 105 06/10/2024   K 4.8 06/10/2024   CO2 20 06/10/2024   BUN 15 06/10/2024   CREATININE 1.09 06/10/2024   EGFR 78 06/10/2024   CALCIUM  9.6 06/10/2024   ALBUMIN  4.1 12/04/2023   GLUCOSE 109 (H) 06/10/2024    Lab Results  Component Value Date   ALT 15 12/04/2023   AST 14 (L) 12/04/2023   ALKPHOS 67 12/04/2023  BILITOT 0.7 12/04/2023   Lab Results  Component Value Date   WBC 6.2 06/10/2024   HGB 15.1 06/10/2024   HCT 47.2 06/10/2024   MCV 94 06/10/2024   PLT 156 06/10/2024   Lab Results  Component Value Date   TSH 1.580 06/10/2024    Imaging Studies   No results found.  Assessment/Plan:    Chronic constipation Poorly managed, recently exacerbated by medication, seems to be worse after amitriptyline.  -start miralax 17 grams three times daily until soft stool, then continue 1-2 times daily to maintain soft regular stools. Call if not effective.  -drink adequate fluids  -increase dietary fiber  Screening colonoscopy -colonoscopy with Dr. Shaaron. ASA 3. Rm 1,2 ok. I thoroughly discussed with the patient the procedure, including the risks involved. Patient understands what the procedure involves including the benefits and any risks. Patient understands alternatives to the proposed procedure. Risks including (but not limited to) bleeding, tearing of the lining (perforation), rupture of adjacent organs, problems with heart and lung function, infection, and medication reactions. A small percentage of complications may require surgery, hospitalization, repeat endoscopic procedure, and/or transfusion. Patient understood and agreed.   Fatty liver on remote ultrasound 2007: -CT without contrast with unremarkable liver in 2022 -FIB 4 favorable, appears to exclude advanced fibrosis -would offer follow up abdominal ultrasound      Sonny RAMAN. Ezzard, MHS, PA-C [facility]

## 2024-09-08 NOTE — Telephone Encounter (Signed)
 [employer] PA:  The prior authorization/notification reference number is: [id number]. The prior authorization/notification case information was transmitted on 09/08/2024 at 12:29 PM CST

## 2024-09-09 DIAGNOSIS — E559 Vitamin D deficiency, unspecified: Secondary | ICD-10-CM | POA: Insufficient documentation

## 2024-09-09 NOTE — Progress Notes (Deleted)
 Subjective:  Patient ID: Edward Fisher, male    DOB: [birth year], 58 y.o.   MRN: [id number]  Patient Care Team: Deitra Morton Hummer, Nena, NP as PCP - General (Nurse Practitioner) Lavona Agent, MD as PCP - Cardiology (Cardiology) Army Dallas NOVAK, MD (Inactive) as Consulting Physician (Cardiothoracic Surgery)   Chief Complaint:  No chief complaint on file.   HPI: Edward Fisher is a 58 y.o. male presenting on 09/13/2024 for No chief complaint on file.   Discussed the use of AI scribe software for clinical note transcription with the patient, who gave verbal consent to proceed.  History of Present Illness       Relevant past medical, surgical, family, and social history reviewed and updated as indicated.  Allergies and medications reviewed and updated. Data reviewed: Chart in Epic.   Past Medical History:  Diagnosis Date   Arteriosclerotic cardiovascular disease (ASCVD)    Onset in 04/2012; CABG in 06/2012 w/ LIMA-LAD, L Rad-CFX, SVG-PL-PDA   Hyperlipidemia    goal LDL < 70   Insomnia    Nephrolithiasis    Parkinson's disease (HCC)     Past Surgical History:  Procedure Laterality Date   CORONARY ARTERY BYPASS GRAFT  06/24/2012   Procedure: CORONARY ARTERY BYPASS GRAFTING (CABG);  Surgeon: Dallas NOVAK Army, MD;  Location: [facility] OR;  Service: Open Heart Surgery;  Laterality: N/A;  Coronary Artery  Bypass X 4 utilizing endoscopic saphenous vein graft and  Left radial Artery Harvest    CORONARY PRESSURE/FFR STUDY N/A 11/14/2023   Procedure: CORONARY PRESSURE/FFR STUDY;  Surgeon: Wendel Lurena POUR, MD;  Location: [facility] INVASIVE CV LAB;  Service: Cardiovascular;  Laterality: N/A;   CYSTOSCOPY W/ RETROGRADES Left 08/23/2015   Procedure: CYSTOSCOPY WITH RETROGRADE PYELOGRAM;  Surgeon: Belvie LITTIE Clara, MD;  Location: [facility] ORS;  Service: Urology;  Laterality: Left;   CYSTOSCOPY WITH URETEROSCOPY, STONE BASKETRY AND STENT PLACEMENT Left 08/23/2015   Procedure: CYSTOSCOPY WITH  URETEROSCOPY, STONE BASKET EXTRACTION;  Surgeon: Belvie LITTIE Clara, MD;  Location: [facility] ORS;  Service: Urology;  Laterality: Left;   HOLMIUM LASER APPLICATION Left 08/23/2015   Procedure: HOLMIUM LASER APPLICATION;  Surgeon: Belvie LITTIE Clara, MD;  Location: [facility] ORS;  Service: Urology;  Laterality: Left;   LEFT HEART CATH AND CORS/GRAFTS ANGIOGRAPHY N/A 11/14/2023   Procedure: LEFT HEART CATH AND CORS/GRAFTS ANGIOGRAPHY;  Surgeon: Wendel Lurena POUR, MD;  Location: [facility] INVASIVE CV LAB;  Service: Cardiovascular;  Laterality: N/A;   LEFT HEART CATHETERIZATION WITH CORONARY ANGIOGRAM N/A 06/23/2012   Procedure: LEFT HEART CATHETERIZATION WITH CORONARY ANGIOGRAM;  Surgeon: Ozell Fell, MD;  Location: [facility] CATH LAB;  Service: Cardiovascular;  Laterality: N/A;   RHINOPLASTY     TONSILLECTOMY  ~1971    Social History   Socioeconomic History   Marital status: Widowed    Spouse name: Not on file   Number of children: Not on file   Years of education: Not on file   Highest education level: Not on file  Occupational History   Occupation: Therapist, Sports: [employer]    Comment: self-employed  Tobacco Use   Smoking status: Never   Smokeless tobacco: Former    Types: Chew    Quit date: 1997   Tobacco comments:    06/22/2012 quit chewing 10-15 years ago  Vaping Use   Vaping status: Never Used  Substance and Sexual Activity   Alcohol use: No    Alcohol/week: 0.0 standard drinks of alcohol  Drug use: No   Sexual activity: Yes  Other Topics Concern   Not on file  Social History Narrative   Regular exercise: No   Social Drivers of Health   Financial Resource Strain: Low Risk  (12/09/2023)   Received from [facility]   Overall Financial Resource Strain (CARDIA)    Difficulty of Paying Living Expenses: Not very hard  Food Insecurity: No Food Insecurity (12/09/2023)   Received from [facility]   Hunger Vital Sign    Within the past 12 months, you worried that your  food would run out before you got the money to buy more.: Never true    Within the past 12 months, the food you bought just didn't last and you didn't have money to get more.: Never true  Transportation Needs: No Transportation Needs (12/09/2023)   Received from [facility] - Transportation    Lack of Transportation (Medical): No    Lack of Transportation (Non-Medical): No  Physical Activity: Not on file  Stress: Not on file  Social Connections: Unknown (03/19/2022)   Received from [facility]   Social Network    Social Network: Not on file  Intimate Partner Violence: Unknown (02/08/2022)   Received from [facility]   HITS    Physically Hurt: Not on file    Insult or Talk Down To: Not on file    Threaten Physical Harm: Not on file    Scream or Curse: Not on file    Outpatient Encounter Medications as of 09/13/2024  Medication Sig   aspirin  81 MG chewable tablet Chew 81 mg by mouth every evening. TO START DOSAGE IN 6 MONTHS APPROXIMATELY July 2014   atorvastatin  (LIPITOR ) 40 MG tablet Take 1 tablet by mouth once daily   carbidopa-levodopa (SINEMET IR) 25-100 MG tablet Take 6 tablets by mouth See admin instructions. Take 1 tablet in the morning and lunch, Then 2 tables at 1600 and 1 tablet at 2000 and 1 tablet at bedtime   [START ON 09/24/2024] Cholecalciferol (VITAMIN D3) 25 MCG (1000 UT) CAPS Take 1 capsule (1,000 Units total) by mouth daily.   isosorbide  mononitrate (IMDUR ) 30 MG 24 hr tablet Take 1 tablet (30 mg total) by mouth daily.   metoprolol  succinate (TOPROL -XL) 25 MG 24 hr tablet Take 1 tablet by mouth once daily   nitroGLYCERIN  (NITROSTAT ) 0.4 MG SL tablet Place 1 tablet (0.4 mg total) under the tongue every 5 (five) minutes as needed for chest pain.   nortriptyline (PAMELOR) 10 MG capsule Take 10 mg by mouth at bedtime. Take 3 capsules at bedtime   Omega-3 Fatty Acids (FISH OIL) 1000 MG CAPS Take 1,000 mg by mouth daily.   tamsulosin  (FLOMAX ) 0.4 MG CAPS  capsule Take 1 capsule by mouth once daily   No facility-administered encounter medications on file as of 09/13/2024.    Allergies  Allergen Reactions   Prozac  [Fluoxetine ] Other (See Comments)    made me shake intolerance.    Pollen Extract Cough    Pertinent ROS per HPI, otherwise unremarkable      Objective:  There were no vitals taken for this visit.   Wt Readings from Last 3 Encounters:  09/08/24 192 lb (87.1 kg)  09/07/24 190 lb (86.2 kg)  06/23/24 190 lb 9.6 oz (86.5 kg)    Physical Exam Physical Exam      Results for orders placed or performed in visit on 06/23/24  Vitamin B12   Collection Time: 06/23/24  3:43  PM  Result Value Ref Range   Vitamin B-12 288 232 - 1,245 pg/mL  VITAMIN D  25 Hydroxy (Vit-D Deficiency, Fractures)   Collection Time: 06/23/24  3:43 PM  Result Value Ref Range   Vit D, 25-Hydroxy 23.7 (L) 30.0 - 100.0 ng/mL       Pertinent labs & imaging results that were available during my care of the patient were reviewed by me and considered in my medical decision making.  Assessment & Plan:  There are no diagnoses linked to this encounter.   Assessment and Plan Assessment & Plan       Continue all other maintenance medications.  Follow up plan: No follow-ups on file.   Continue healthy lifestyle choices, including diet (rich in fruits, vegetables, and lean proteins, and low in salt and simple carbohydrates) and exercise (at least 30 minutes of moderate physical activity daily).  Educational handout given for ***  The above assessment and management plan was discussed with the patient. The patient verbalized understanding of and has agreed to the management plan. Patient is aware to call the clinic if they develop any new symptoms or if symptoms persist or worsen. Patient is aware when to return to the clinic for a follow-up visit. Patient educated on when it is appropriate to go to the emergency department.   Mischele Detter [place]  Thompson, DNP [facility] [address] [place], [place] [address] [phone]

## 2024-09-13 ENCOUNTER — Ambulatory Visit: Payer: Self-pay | Admitting: Nurse Practitioner

## 2024-09-13 DIAGNOSIS — I10 Essential (primary) hypertension: Secondary | ICD-10-CM

## 2024-09-13 DIAGNOSIS — E782 Mixed hyperlipidemia: Secondary | ICD-10-CM

## 2024-09-13 DIAGNOSIS — E559 Vitamin D deficiency, unspecified: Secondary | ICD-10-CM

## 2024-09-14 ENCOUNTER — Ambulatory Visit (HOSPITAL_COMMUNITY)
Admission: RE | Admit: 2024-09-14 | Discharge: 2024-09-14 | Disposition: A | Discharge status: Home / Self Care | Source: Ambulatory Visit | Attending: Gastroenterology | Admitting: Gastroenterology

## 2024-09-14 DIAGNOSIS — K760 Fatty (change of) liver, not elsewhere classified: Secondary | ICD-10-CM | POA: Diagnosis present

## 2024-09-14 NOTE — Telephone Encounter (Signed)
 [employer] PA:  Authorization # [id number] Start date 09/16/2024 End date 12/15/2024

## 2024-09-16 ENCOUNTER — Ambulatory Visit (HOSPITAL_COMMUNITY): Admitting: Anesthesiology

## 2024-09-16 ENCOUNTER — Other Ambulatory Visit: Payer: Self-pay

## 2024-09-16 ENCOUNTER — Ambulatory Visit (HOSPITAL_COMMUNITY)
Admission: RE | Admit: 2024-09-16 | Discharge: 2024-09-16 | Disposition: A | Discharge status: Home / Self Care | Attending: Internal Medicine | Admitting: Internal Medicine

## 2024-09-16 ENCOUNTER — Encounter (HOSPITAL_COMMUNITY): Payer: Self-pay | Admitting: Internal Medicine

## 2024-09-16 ENCOUNTER — Encounter (HOSPITAL_COMMUNITY)
Admission: RE | Disposition: A | Discharge status: Home / Self Care | Payer: Self-pay | Source: Home / Self Care | Attending: Internal Medicine

## 2024-09-16 DIAGNOSIS — Z1211 Encounter for screening for malignant neoplasm of colon: Secondary | ICD-10-CM

## 2024-09-16 DIAGNOSIS — I2510 Atherosclerotic heart disease of native coronary artery without angina pectoris: Secondary | ICD-10-CM

## 2024-09-16 DIAGNOSIS — K641 Second degree hemorrhoids: Secondary | ICD-10-CM | POA: Insufficient documentation

## 2024-09-16 DIAGNOSIS — I10 Essential (primary) hypertension: Secondary | ICD-10-CM | POA: Insufficient documentation

## 2024-09-16 DIAGNOSIS — E785 Hyperlipidemia, unspecified: Secondary | ICD-10-CM | POA: Diagnosis not present

## 2024-09-16 DIAGNOSIS — Z951 Presence of aortocoronary bypass graft: Secondary | ICD-10-CM | POA: Insufficient documentation

## 2024-09-16 DIAGNOSIS — K5730 Diverticulosis of large intestine without perforation or abscess without bleeding: Secondary | ICD-10-CM | POA: Insufficient documentation

## 2024-09-16 HISTORY — PX: COLONOSCOPY: SHX5424

## 2024-09-16 SURGERY — COLONOSCOPY
Anesthesia: Monitor Anesthesia Care

## 2024-09-16 MED ORDER — LACTATED RINGERS IV SOLN: INTRAVENOUS | Status: DC

## 2024-09-16 MED ORDER — PROPOFOL 500 MG/50ML IV EMUL
INTRAVENOUS | Status: DC | PRN
  Administered 2024-09-16: 100 mg via INTRAVENOUS
  Administered 2024-09-16: 150 ug/kg/min via INTRAVENOUS
  Administered 2024-09-16: 100 mg via INTRAVENOUS

## 2024-09-16 NOTE — Discharge Instructions (Addendum)
  Colonoscopy Discharge Instructions  Read the instructions outlined below and refer to this sheet in the next few weeks. These discharge instructions provide you with general information on caring for yourself after you leave the hospital. Your doctor may also give you specific instructions. While your treatment has been planned according to the most current medical practices available, unavoidable complications occasionally occur. If you have any problems or questions after discharge, call Dr. Shaaron at [phone]. ACTIVITY You may resume your regular activity, but move at a slower pace for the next 24 hours.  Take frequent rest periods for the next 24 hours.  Walking will help get rid of the air and reduce the bloated feeling in your belly (abdomen).  No driving for 24 hours (because of the medicine (anesthesia) used during the test).   Do not sign any important legal documents or operate any machinery for 24 hours (because of the anesthesia used during the test).  NUTRITION Drink plenty of fluids.  You may resume your normal diet as instructed by your doctor.  Begin with a light meal and progress to your normal diet. Heavy or fried foods are harder to digest and may make you feel sick to your stomach (nauseated).  Avoid alcoholic beverages for 24 hours or as instructed.  MEDICATIONS You may resume your normal medications unless your doctor tells you otherwise.  WHAT YOU CAN EXPECT TODAY Some feelings of bloating in the abdomen.  Passage of more gas than usual.  Spotting of blood in your stool or on the toilet paper.  IF YOU HAD POLYPS REMOVED DURING THE COLONOSCOPY: No aspirin  products for 7 days or as instructed.  No alcohol for 7 days or as instructed.  Eat a soft diet for the next 24 hours.  FINDING OUT THE RESULTS OF YOUR TEST Not all test results are available during your visit. If your test results are not back during the visit, make an appointment with your caregiver to find out the  results. Do not assume everything is normal if you have not heard from your caregiver or the medical facility. It is important for you to follow up on all of your test results.  SEEK IMMEDIATE MEDICAL ATTENTION IF: You have more than a spotting of blood in your stool.  Your belly is swollen (abdominal distention).  You are nauseated or vomiting.  You have a temperature over 101.  You have abdominal pain or discomfort that is severe or gets worse throughout the day.     No polyps found today  You do have diverticulosis  Repeat colonoscopy in 10 years recommended  For constipation take a capful of MiraLAX powder in 8 ounces of water nightly.  Also recommended daily fiber supplement such as Benefiber-2 tablespoons daily  Office visit with us  to follow-up on constipation in 3 months

## 2024-09-16 NOTE — Transfer of Care (Signed)
 Immediate Anesthesia Transfer of Care Note  Patient: Edward Fisher  Procedure(s) Performed: COLONOSCOPY  Patient Location: Endoscopy Unit  Anesthesia Type:MAC  Level of Consciousness: drowsy and patient cooperative  Airway & Oxygen Therapy: Patient Spontanous Breathing  Post-op Assessment: Report given to RN and Post -op Vital signs reviewed and stable  Post vital signs: Reviewed and stable  Last Vitals:  Vitals Value Taken Time  BP 97/58 09/16/24 11:28  Temp 36.8 C 09/16/24 11:28  Pulse 69 09/16/24 11:28  Resp 15 09/16/24 11:28  SpO2 97 % 09/16/24 11:28    Last Pain:  Vitals:   09/16/24 1128  TempSrc: Oral  PainSc:       Patients Stated Pain Goal: 5 (09/16/24 0958)  Complications: No notable events documented.

## 2024-09-16 NOTE — Op Note (Signed)
 [facility] Patient Name: Edward Fisher Procedure Date: 09/16/2024 10:53 AM MRN: [id number] Date of Birth: 1966/09/08 Attending MD: Lamar Ozell Hollingshead , MD, [id number] CSN: [id number] Age: 58 Admit Type: Outpatient Procedure:                Colonoscopy Indications:              Screening for colorectal malignant neoplasm Providers:                Lamar Ozell Hollingshead, MD, Crystal Page, Madelin Hunter, RN Referring MD:              Medicines:                Propofol  per Anesthesia Complications:            No immediate complications. Estimated Blood Loss:     Estimated blood loss: none. Procedure:                Pre-Anesthesia Assessment:                           - Prior to the procedure, a History and Physical                            was performed, and patient medications and                            allergies were reviewed. The patient's tolerance of                            previous anesthesia was also reviewed. The risks                            and benefits of the procedure and the sedation                            options and risks were discussed with the patient.                            All questions were answered, and informed consent                            was obtained. Prior Anticoagulants: The patient has                            taken no anticoagulant or antiplatelet agents. ASA                            Grade Assessment: III - A patient with severe                            systemic disease. After reviewing the risks and  benefits, the patient was deemed in satisfactory                            condition to undergo the procedure.                           After obtaining informed consent, the colonoscope                            was passed under direct vision. Throughout the                            procedure, the patient's blood pressure, pulse, and                            oxygen  saturations were monitored continuously. The                            [id number] ([id number]) Colon was introduced through                            the anus and advanced to the the cecum, identified                            by appendiceal orifice and ileocecal valve. The                            colonoscopy was performed without difficulty. The                            patient tolerated the procedure well. The quality                            of the bowel preparation was adequate. The                            ileocecal valve, appendiceal orifice, and rectum                            were photographed. Scope In: 11:11:18 AM Scope Out: 11:24:37 AM Scope Withdrawal Time: 0 hours 7 minutes 32 seconds  Total Procedure Duration: 0 hours 13 minutes 19 seconds  Findings:      The perianal and digital rectal examinations were normal.      Scattered medium-mouthed diverticula were found in the sigmoid colon and       descending colon.      Non-bleeding internal hemorrhoids were found during retroflexion. The       hemorrhoids were moderate, medium-sized and Grade II (internal       hemorrhoids that prolapse but reduce spontaneously).      The exam was otherwise without abnormality on direct and retroflexion       views. Impression:               - Diverticulosis in the sigmoid colon and in the  descending colon.                           - Non-bleeding internal hemorrhoids.                           - The examination was otherwise normal on direct                            and retroflexion views.                           - No specimens collected. Moderate Sedation:      Moderate (conscious) sedation was personally administered by an       anesthesia professional. The following parameters were monitored: oxygen       saturation, heart rate, blood pressure, respiratory rate, EKG, adequacy       of pulmonary ventilation, and response to care. Recommendation:            - Patient has a contact number available for                            emergencies. The signs and symptoms of potential                            delayed complications were discussed with the                            patient. Return to normal activities tomorrow.                            Written discharge instructions were provided to the                            patient.                           - Advance diet as tolerated.                           - Continue present medications. For MiraLAX at                            bedtime. Daily Benefiber 2 tablespoons.                           - Repeat colonoscopy in 10 years for screening                            purposes.                           - Return to GI office in 3 months. Procedure Code(s):        --- Professional ---                           [phone], Colonoscopy, flexible; diagnostic, including  collection of specimen(s) by brushing or washing,                            when performed (separate procedure) Diagnosis Code(s):        --- Professional ---                           Z12.11, Encounter for screening for malignant                            neoplasm of colon                           K64.1, Second degree hemorrhoids                           K57.30, Diverticulosis of large intestine without                            perforation or abscess without bleeding CPT copyright 2022 American Medical Association. All rights reserved. The codes documented in this report are preliminary and upon coder review may  be revised to meet current compliance requirements. Lamar HERO. Gervase Colberg, MD Lamar Ozell Hollingshead, MD 09/16/2024 11:32:53 AM This report has been signed electronically. Number of Addenda: 0

## 2024-09-16 NOTE — Interval H&P Note (Signed)
 History and Physical Interval Note:  09/16/2024 11:00 AM  Edward Fisher  has presented today for surgery, with the diagnosis of SCREENING.  The various methods of treatment have been discussed with the patient and family. After consideration of risks, benefits and other options for treatment, the patient has consented to  Procedure(s) with comments: COLONOSCOPY (N/A) - 11:30 AM, OK RM 1-2 as a surgical intervention.  The patient's history has been reviewed, patient examined, no change in status, stable for surgery.  I have reviewed the patient's chart and labs.  Questions were answered to the patient's satisfaction.     Edward Fisher  No change.  Screening colonoscopy today per plan.  The risks, benefits, limitations, alternatives and imponderables have been reviewed with the patient. Questions have been answered. All parties are agreeable.

## 2024-09-16 NOTE — Anesthesia Procedure Notes (Signed)
 Date/Time: 09/16/2024 11:04 AM  Performed by: Barbarann Verneita RAMAN, CRNAPre-anesthesia Checklist: Patient identified, Emergency Drugs available, Suction available, Timeout performed and Patient being monitored Patient Re-evaluated:Patient Re-evaluated prior to induction Oxygen Delivery Method: Nasal Cannula

## 2024-09-16 NOTE — Anesthesia Preprocedure Evaluation (Signed)
 Anesthesia Evaluation  Patient identified by MRN, date of birth, ID band Patient awake    Reviewed: Allergy & Precautions, H&P , NPO status , Patient's Chart, lab work & pertinent test results, reviewed documented beta blocker date and time   Airway Mallampati: II  TM Distance: >3 FB Neck ROM: full    Dental no notable dental hx.    Pulmonary neg pulmonary ROS   Pulmonary exam normal breath sounds clear to auscultation       Cardiovascular Exercise Tolerance: Good hypertension, + CAD and + CABG   Rhythm:regular Rate:Normal     Neuro/Psych negative neurological ROS  negative psych ROS   GI/Hepatic negative GI ROS, Neg liver ROS,,,  Endo/Other  negative endocrine ROS    Renal/GU negative Renal ROS  negative genitourinary   Musculoskeletal   Abdominal   Peds  Hematology negative hematology ROS (+)   Anesthesia Other Findings   Reproductive/Obstetrics negative OB ROS                              Anesthesia Physical Anesthesia Plan  ASA: 3  Anesthesia Plan: MAC   Post-op Pain Management:    Induction:   PONV Risk Score and Plan: Propofol  infusion  Airway Management Planned:   Additional Equipment:   Intra-op Plan:   Post-operative Plan:   Informed Consent: I have reviewed the patients History and Physical, chart, labs and discussed the procedure including the risks, benefits and alternatives for the proposed anesthesia with the patient or authorized representative who has indicated his/her understanding and acceptance.     Dental Advisory Given  Plan Discussed with: CRNA  Anesthesia Plan Comments:         Anesthesia Quick Evaluation

## 2024-09-17 NOTE — Anesthesia Postprocedure Evaluation (Signed)
 Anesthesia Post Note  Patient: Edward Fisher  Procedure(s) Performed: COLONOSCOPY  Patient location during evaluation: Phase II Anesthesia Type: MAC Level of consciousness: awake Pain management: pain level controlled Vital Signs Assessment: post-procedure vital signs reviewed and stable Respiratory status: spontaneous breathing and respiratory function stable Cardiovascular status: blood pressure returned to baseline and stable Postop Assessment: no headache and no apparent nausea or vomiting Anesthetic complications: no Comments: Late entry   No notable events documented.   Last Vitals:  Vitals:   09/16/24 1128 09/16/24 1134  BP: (!) 97/58 108/62  Pulse: 69 65  Resp: 15 20  Temp: 36.8 C   SpO2: 97% 98%    Last Pain:  Vitals:   09/16/24 1134  TempSrc:   PainSc: 0-No pain                 Yvonna JINNY Bosworth

## 2024-09-20 ENCOUNTER — Encounter (HOSPITAL_COMMUNITY): Payer: Self-pay | Admitting: Internal Medicine

## 2024-09-20 NOTE — Progress Notes (Signed)
 Subjective:  Patient ID: Edward Fisher, male    DOB: 17-Feb-1966, 58 y.o.   MRN: [id number]  Patient Care Team: Deitra Morton Hummer, Nena, NP as PCP - General (Nurse Practitioner) Lavona Agent, MD as PCP - Cardiology (Cardiology) Army Dallas NOVAK, MD (Inactive) as Consulting Physician (Cardiothoracic Surgery)   Chief Complaint:  No chief complaint on file.   HPI: Edward Fisher is a 58 y.o. male presenting on 09/22/2024 for No chief complaint on file.   Discussed the use of AI scribe software for clinical note transcription with the patient, who gave verbal consent to proceed.  History of Present Illness       Relevant past medical, surgical, family, and social history reviewed and updated as indicated.  Allergies and medications reviewed and updated. Data reviewed: Chart in Epic.   Past Medical History:  Diagnosis Date   Arteriosclerotic cardiovascular disease (ASCVD)    Onset in 04/2012; CABG in 06/2012 w/ LIMA-LAD, L Rad-CFX, SVG-PL-PDA   Hyperlipidemia    goal LDL < 70   Insomnia    Nephrolithiasis    Parkinson's disease (HCC)     Past Surgical History:  Procedure Laterality Date   COLONOSCOPY N/A 09/16/2024   Procedure: COLONOSCOPY;  Surgeon: Shaaron Lamar HERO, MD;  [facility];  Service: Endoscopy;  Laterality: N/A;  11:30 AM, OK RM 1-2   CORONARY ARTERY BYPASS GRAFT  06/24/2012   Procedure: CORONARY ARTERY BYPASS GRAFTING (CABG);  Surgeon: Dallas NOVAK Army, MD;  Location: [facility] OR;  Service: Open Heart Surgery;  Laterality: N/A;  Coronary Artery  Bypass X 4 utilizing endoscopic saphenous vein graft and  Left radial Artery Harvest    CORONARY PRESSURE/FFR STUDY N/A 11/14/2023   Procedure: CORONARY PRESSURE/FFR STUDY;  Surgeon: Wendel Lurena POUR, MD;  [facility];  Service: Cardiovascular;  Laterality: N/A;   CYSTOSCOPY W/ RETROGRADES Left 08/23/2015   Procedure: CYSTOSCOPY WITH RETROGRADE PYELOGRAM;  Surgeon: Belvie LITTIE Clara, MD;   Location: [facility] ORS;  Service: Urology;  Laterality: Left;   CYSTOSCOPY WITH URETEROSCOPY, STONE BASKETRY AND STENT PLACEMENT Left 08/23/2015   Procedure: CYSTOSCOPY WITH URETEROSCOPY, STONE BASKET EXTRACTION;  Surgeon: Belvie LITTIE Clara, MD;  Location: [facility];  Service: Urology;  Laterality: Left;   HOLMIUM LASER APPLICATION Left 08/23/2015   Procedure: HOLMIUM LASER APPLICATION;  Surgeon: Belvie LITTIE Clara, MD;  Location: [facility];  Service: Urology;  Laterality: Left;   LEFT HEART CATH AND CORS/GRAFTS ANGIOGRAPHY N/A 11/14/2023   Procedure: LEFT HEART CATH AND CORS/GRAFTS ANGIOGRAPHY;  Surgeon: Wendel Lurena POUR, MD;  [facility];  Service: Cardiovascular;  Laterality: N/A;   LEFT HEART CATHETERIZATION WITH CORONARY ANGIOGRAM N/A 06/23/2012   Procedure: LEFT HEART CATHETERIZATION WITH CORONARY ANGIOGRAM;  Surgeon: Ozell Fell, MD;  Location: [facility] CATH LAB;  Service: Cardiovascular;  Laterality: N/A;   RHINOPLASTY     TONSILLECTOMY  ~1971    Social History   Socioeconomic History   Marital status: Widowed    Spouse name: Not on file   Number of children: Not on file   Years of education: Not on file   Highest education level: Not on file  Occupational History   Occupation: Therapist, 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  Tobacco Use   Smoking status: Never   Smokeless tobacco: Former    Types: Chew    Quit date: 1997   Tobacco comments:    06/22/2012 quit chewing  10-15 years ago  Vaping Use   Vaping status: Never Used  Substance and Sexual Activity   Alcohol use: No    Alcohol/week: 0.0 standard drinks of alcohol   Drug use: No   Sexual activity: Yes  Other Topics Concern   Not on file  Social History Narrative   Regular exercise: No   Social Drivers of Corporate Investment Banker Strain: Low Risk  (12/09/2023)   Received from [facility]   Overall Financial Resource Strain (CARDIA)    Difficulty of Paying Living Expenses:  Not very hard  Food Insecurity: No Food Insecurity (12/09/2023)   Received from [facility]   Hunger Vital Sign    Within the past 12 months, you worried that your food would run out before you got the money to buy more.: Never true    Within the past 12 months, the food you bought just didn't last and you didn't have money to get more.: Never true  Transportation Needs: No Transportation Needs (12/09/2023)   Received from [facility] - Transportation    Lack of Transportation (Medical): No    Lack of Transportation (Non-Medical): No  Physical Activity: Not on file  Stress: Not on file  Social Connections: Not on file  Intimate Partner Violence: Not on file    Outpatient Encounter Medications as of 09/22/2024  Medication Sig   aspirin  81 MG chewable tablet Chew 81 mg by mouth every evening. TO START DOSAGE IN 6 MONTHS APPROXIMATELY July 2014   atorvastatin  (LIPITOR ) 40 MG tablet Take 1 tablet by mouth once daily   carbidopa-levodopa (SINEMET IR) 25-100 MG tablet Take 6 tablets by mouth See admin instructions. Take 1 tablet in the morning and lunch, Then 2 tables at 1600 and 1 tablet at 2000 and 1 tablet at bedtime   [START ON 09/24/2024] Cholecalciferol (VITAMIN D3) 25 MCG (1000 UT) CAPS Take 1 capsule (1,000 Units total) by mouth daily.   isosorbide  mononitrate (IMDUR ) 30 MG 24 hr tablet Take 1 tablet (30 mg total) by mouth daily.   metoprolol  succinate (TOPROL -XL) 25 MG 24 hr tablet Take 1 tablet by mouth once daily   nitroGLYCERIN  (NITROSTAT ) 0.4 MG SL tablet Place 1 tablet (0.4 mg total) under the tongue every 5 (five) minutes as needed for chest pain.   nortriptyline (PAMELOR) 10 MG capsule Take 10 mg by mouth at bedtime. Take 3 capsules at bedtime   Omega-3 Fatty Acids (FISH OIL) 1000 MG CAPS Take 1,000 mg by mouth daily.   tamsulosin  (FLOMAX ) 0.4 MG CAPS capsule Take 1 capsule by mouth once daily   No facility-administered encounter medications on file as of 09/22/2024.     Allergies  Allergen Reactions   Prozac  [Fluoxetine ] Other (See Comments)    made me shake intolerance.    Pollen Extract Cough    Pertinent ROS per HPI, otherwise unremarkable      Objective:  There were no vitals taken for this visit.   Wt Readings from Last 3 Encounters:  09/08/24 192 lb (87.1 kg)  09/07/24 190 lb (86.2 kg)  06/23/24 190 lb 9.6 oz (86.5 kg)    Physical Exam Physical Exam      Results for orders placed or performed in visit on 06/23/24  Vitamin B12   Collection Time: 06/23/24  3:43 PM  Result Value Ref Range   Vitamin B-12 288 232 - 1,245 pg/mL  VITAMIN D  25 Hydroxy (Vit-D Deficiency, Fractures)   Collection Time: 06/23/24  3:43 PM  Result Value Ref Range   Vit D, 25-Hydroxy 23.7 (L) 30.0 - 100.0 ng/mL       Pertinent labs & imaging results that were available during my care of the patient were reviewed by me and considered in my medical decision making.  Assessment & Plan:  There are no diagnoses linked to this encounter.   Assessment and Plan Assessment & Plan       Continue all other maintenance medications.  Follow up plan: No follow-ups on file.   Continue healthy lifestyle choices, including diet (rich in fruits, vegetables, and lean proteins, and low in salt and simple carbohydrates) and exercise (at least 30 minutes of moderate physical activity daily).  Educational handout given for ***  The above assessment and management plan was discussed with the patient. The patient verbalized understanding of and has agreed to the management plan. Patient is aware to call the clinic if they develop any new symptoms or if symptoms persist or worsen. Patient is aware when to return to the clinic for a follow-up visit. Patient educated on when it is appropriate to go to the emergency department.   Mikell Kazlauskas [place] Thompson, DNP [facility] [address] [place], [place] [address] [phone]

## 2024-09-22 ENCOUNTER — Ambulatory Visit: Payer: Self-pay | Admitting: Gastroenterology

## 2024-09-22 ENCOUNTER — Encounter: Payer: Self-pay | Admitting: Nurse Practitioner

## 2024-09-22 ENCOUNTER — Ambulatory Visit: Admitting: Nurse Practitioner

## 2024-09-22 VITALS — BP 96/60 | HR 74 | Temp 97.3°F | Ht 70.0 in | Wt 190.0 lb

## 2024-09-22 DIAGNOSIS — G4701 Insomnia due to medical condition: Secondary | ICD-10-CM

## 2024-09-22 DIAGNOSIS — E782 Mixed hyperlipidemia: Secondary | ICD-10-CM

## 2024-09-22 DIAGNOSIS — E559 Vitamin D deficiency, unspecified: Secondary | ICD-10-CM | POA: Diagnosis not present

## 2024-09-22 DIAGNOSIS — I10 Essential (primary) hypertension: Secondary | ICD-10-CM

## 2024-09-22 DIAGNOSIS — E663 Overweight: Secondary | ICD-10-CM

## 2024-09-22 DIAGNOSIS — R7989 Other specified abnormal findings of blood chemistry: Secondary | ICD-10-CM

## 2024-09-22 DIAGNOSIS — G8929 Other chronic pain: Secondary | ICD-10-CM

## 2024-09-22 DIAGNOSIS — K760 Fatty (change of) liver, not elsewhere classified: Secondary | ICD-10-CM

## 2024-09-22 DIAGNOSIS — M546 Pain in thoracic spine: Secondary | ICD-10-CM

## 2024-09-22 DIAGNOSIS — G20A1 Parkinson's disease without dyskinesia, without mention of fluctuations: Secondary | ICD-10-CM

## 2024-09-22 MED ORDER — LIDOCAINE 4 % EX PTCH: 1.0000 | MEDICATED_PATCH | CUTANEOUS | 0 refills | Status: AC

## 2024-10-13 ENCOUNTER — Ambulatory Visit

## 2024-10-21 ENCOUNTER — Other Ambulatory Visit: Payer: Self-pay

## 2024-10-21 DIAGNOSIS — K760 Fatty (change of) liver, not elsewhere classified: Secondary | ICD-10-CM

## 2024-10-21 DIAGNOSIS — R7989 Other specified abnormal findings of blood chemistry: Secondary | ICD-10-CM

## 2024-10-22 ENCOUNTER — Encounter: Payer: Self-pay | Admitting: Physical Medicine & Rehabilitation

## 2024-10-22 ENCOUNTER — Encounter: Attending: Physical Medicine & Rehabilitation | Admitting: Physical Medicine & Rehabilitation

## 2024-10-22 VITALS — BP 114/72 | HR 81 | Ht 70.0 in | Wt 189.0 lb

## 2024-10-22 DIAGNOSIS — M546 Pain in thoracic spine: Secondary | ICD-10-CM | POA: Diagnosis present

## 2024-10-22 DIAGNOSIS — G8929 Other chronic pain: Secondary | ICD-10-CM | POA: Insufficient documentation

## 2024-10-22 NOTE — Progress Notes (Signed)
 "  Subjective:    Patient ID: Edward Fisher, male    DOB: Mar 10, 1966, 58 y.o.   MRN: [id number]  HPI Discussed the use of AI scribe software for clinical note transcription with the patient, who gave verbal consent to proceed.  History of Present Illness Edward Fisher is a 58 year old male with Parkinson's disease who presents for evaluation of chronic episodic thoracic back pain.  He reports episodic pain localized to the mid-thoracic spine for approximately six to seven years. The pain is typically centered in the thoracic region and occasionally radiates inferiorly. Symptoms are aggravated by activities involving forward arm extension, such as washing dishes, folding clothes, and playing musical instruments, as well as by prolonged standing during part-time work. The pain is not consistently provoked by specific spinal movements, as overhead arm elevation, arm crossing, or trunk rotation do not reliably exacerbate symptoms. He describes the pain as episodic and not constant.  He has undergone cervical, thoracic, and lumbar spine MRI. Thoracic MRI revealed mild spinal stenosis at T6. EMG was normal despite ongoing weakness and abnormal sensations in his leg and arm, which he attributes to Parkinson's disease.  He previously trialed gabapentin up to six tablets daily without benefit and discontinued due to cognitive side effects, including fogginess, which he feels is worsened by Parkinson's disease. He has used topical patches but found them difficult to apply independently due to pain location and lack of assistance. He participated in physical therapy, most recently in 2023, and reports improved outcomes with supervised therapy. He has attempted home exercise programs, including resistance band rowing, but has difficulty maintaining these routines without supervision.    Pain Inventory Average Pain 8 Pain Right Now 5 My pain is intermittent, dull, aching, and Numb  In the last 24 hours, has  pain interfered with the following? General activity 8 Relation with others 0 Enjoyment of life 7 What TIME of day is your pain at its worst? morning , daytime, evening, and night Sleep (in general) Good  Pain is worse with: walking, bending, standing, and some activites Pain improves with: rest and medication Relief from Meds: 4  Family History  Problem Relation Age of Onset   Hypertension Mother    Ulcers Father        Peptic ulcer disease   Coronary artery disease Father        Stents   Coronary artery disease Brother        CABG   Colon cancer Neg Hx    Liver disease Neg Hx    Social History   Socioeconomic History   Marital status: Widowed    Spouse name: Not on file   Number of children: Not on file   Years of education: Not on file   Highest education level: Not on file  Occupational History   Occupation: Therapist, 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  Tobacco Use   Smoking status: Never   Smokeless tobacco: Former    Types: Chew    Quit date: 1997   Tobacco comments:    06/22/2012 quit chewing 10-15 years ago  Vaping Use   Vaping status: Never Used  Substance and Sexual Activity   Alcohol use: No    Alcohol/week: 0.0 standard drinks of alcohol   Drug use: No   Sexual activity: Yes  Other Topics Concern   Not on file  Social History Narrative   Regular exercise: No   Social Drivers  of Health   Tobacco Use: Medium Risk (09/22/2024)   Patient History    Smoking Tobacco Use: Never    Smokeless Tobacco Use: Former    Passive Exposure: Not on Actuary Strain: Low Risk (12/09/2023)   Received from [facility]   Overall Financial Resource Strain (CARDIA)    Difficulty of Paying Living Expenses: Not very hard  Food Insecurity: No Food Insecurity (12/09/2023)   Received from [facility]   Epic    Within the past 12 months, you worried that your food would run out before you got the money to buy more.:  Never true    Within the past 12 months, the food you bought just didn't last and you didn't have money to get more.: Never true  Transportation Needs: No Transportation Needs (12/09/2023)   Received from [facility] - Transportation    Lack of Transportation (Medical): No    Lack of Transportation (Non-Medical): No  Physical Activity: Not on file  Stress: Not on file  Social Connections: Not on file  Depression (PHQ2-9): Low Risk (10/22/2024)   Depression (PHQ2-9)    PHQ-2 Score: 0  Recent Concern: Depression (PHQ2-9) - Medium Risk (09/07/2024)   Depression (PHQ2-9)    PHQ-2 Score: 7  Alcohol Screen: Not on file  Housing: Low Risk (12/09/2023)   Received from [facility]    In the last 12 months, was there a time when you were not able to pay the mortgage or rent on time?: No    In the past 12 months, how many times have you moved where you were living?: 0    At any time in the past 12 months, were you homeless or living in a shelter (including now)?: No  Utilities: Not At Risk (12/09/2023)   Received from [facility] Utilities    Threatened with loss of utilities: No  Health Literacy: Not on file   Past Surgical History:  Procedure Laterality Date   COLONOSCOPY N/A 09/16/2024   Procedure: COLONOSCOPY;  Surgeon: Shaaron Lamar HERO, MD;  [facility];  Service: Endoscopy;  Laterality: N/A;  11:30 AM, OK RM 1-2   CORONARY ARTERY BYPASS GRAFT  06/24/2012   Procedure: CORONARY ARTERY BYPASS GRAFTING (CABG);  Surgeon: Dallas KATHEE Jude, MD;  Location: [facility] OR;  Service: Open Heart Surgery;  Laterality: N/A;  Coronary Artery  Bypass X 4 utilizing endoscopic saphenous vein graft and  Left radial Artery Harvest    CORONARY PRESSURE/FFR STUDY N/A 11/14/2023   Procedure: CORONARY PRESSURE/FFR STUDY;  Surgeon: Wendel Lurena POUR, MD;  [facility];  Service: Cardiovascular;  Laterality: N/A;   CYSTOSCOPY W/ RETROGRADES Left 08/23/2015   Procedure:  CYSTOSCOPY WITH RETROGRADE PYELOGRAM;  Surgeon: Belvie LITTIE Clara, MD;  Location: [facility];  Service: Urology;  Laterality: Left;   CYSTOSCOPY WITH URETEROSCOPY, STONE BASKETRY AND STENT PLACEMENT Left 08/23/2015   Procedure: CYSTOSCOPY WITH URETEROSCOPY, STONE BASKET EXTRACTION;  Surgeon: Belvie LITTIE Clara, MD;  Location: [facility];  Service: Urology;  Laterality: Left;   HOLMIUM LASER APPLICATION Left 08/23/2015   Procedure: HOLMIUM LASER APPLICATION;  Surgeon: Belvie LITTIE Clara, MD;  Location: [facility];  Service: Urology;  Laterality: Left;   LEFT HEART CATH AND CORS/GRAFTS ANGIOGRAPHY N/A 11/14/2023   Procedure: LEFT HEART CATH AND CORS/GRAFTS ANGIOGRAPHY;  Surgeon: Wendel Lurena POUR, MD;  [facility];  Service: Cardiovascular;  Laterality: N/A;   LEFT  HEART CATHETERIZATION WITH CORONARY ANGIOGRAM N/A 06/23/2012   Procedure: LEFT HEART CATHETERIZATION WITH CORONARY ANGIOGRAM;  Surgeon: Ozell Fell, MD;  Location: [facility] CATH LAB;  Service: Cardiovascular;  Laterality: N/A;   RHINOPLASTY     TONSILLECTOMY  ~1971   Past Surgical History:  Procedure Laterality Date   COLONOSCOPY N/A 09/16/2024   Procedure: COLONOSCOPY;  Surgeon: Shaaron Lamar HERO, MD;  [facility];  Service: Endoscopy;  Laterality: N/A;  11:30 AM, OK RM 1-2   CORONARY ARTERY BYPASS GRAFT  06/24/2012   Procedure: CORONARY ARTERY BYPASS GRAFTING (CABG);  Surgeon: Dallas KATHEE Jude, MD;  Location: [facility] OR;  Service: Open Heart Surgery;  Laterality: N/A;  Coronary Artery  Bypass X 4 utilizing endoscopic saphenous vein graft and  Left radial Artery Harvest    CORONARY PRESSURE/FFR STUDY N/A 11/14/2023   Procedure: CORONARY PRESSURE/FFR STUDY;  Surgeon: Wendel Lurena POUR, MD;  [facility];  Service: Cardiovascular;  Laterality: N/A;   CYSTOSCOPY W/ RETROGRADES Left 08/23/2015   Procedure: CYSTOSCOPY WITH RETROGRADE PYELOGRAM;  Surgeon: Belvie LITTIE Clara, MD;  Location: [facility];  Service: Urology;   Laterality: Left;   CYSTOSCOPY WITH URETEROSCOPY, STONE BASKETRY AND STENT PLACEMENT Left 08/23/2015   Procedure: CYSTOSCOPY WITH URETEROSCOPY, STONE BASKET EXTRACTION;  Surgeon: Belvie LITTIE Clara, MD;  Location: [facility];  Service: Urology;  Laterality: Left;   HOLMIUM LASER APPLICATION Left 08/23/2015   Procedure: HOLMIUM LASER APPLICATION;  Surgeon: Belvie LITTIE Clara, MD;  Location: [facility];  Service: Urology;  Laterality: Left;   LEFT HEART CATH AND CORS/GRAFTS ANGIOGRAPHY N/A 11/14/2023   Procedure: LEFT HEART CATH AND CORS/GRAFTS ANGIOGRAPHY;  Surgeon: Wendel Lurena POUR, MD;  [facility];  Service: Cardiovascular;  Laterality: N/A;   LEFT HEART CATHETERIZATION WITH CORONARY ANGIOGRAM N/A 06/23/2012   Procedure: LEFT HEART CATHETERIZATION WITH CORONARY ANGIOGRAM;  Surgeon: Ozell Fell, MD;  Location: [facility] CATH LAB;  Service: Cardiovascular;  Laterality: N/A;   RHINOPLASTY     TONSILLECTOMY  ~1971   Past Medical History:  Diagnosis Date   Arteriosclerotic cardiovascular disease (ASCVD)    Onset in 04/2012; CABG in 06/2012 w/ LIMA-LAD, L Rad-CFX, SVG-PL-PDA   Hyperlipidemia    goal LDL < 70   Insomnia    Nephrolithiasis    Parkinson's disease (HCC)    Ht 5' 10 (1.778 m)   Wt 189 lb (85.7 kg)   BMI 27.12 kg/m   Opioid Risk Score:   Fall Risk Score:  `1  Depression screen PHQ 2/9     10/22/2024    1:20 PM 09/22/2024   12:00 PM 09/07/2024    2:29 PM 06/10/2024    9:04 AM 07/23/2012   12:59 PM  Depression screen PHQ 2/9  Decreased Interest 0  1 1 0  Down, Depressed, Hopeless 0  0 1 0  PHQ - 2 Score 0  1 2 0   Altered sleeping  0 1 0   Tired, decreased energy  0 3 0   Change in appetite  1 0 0   Feeling bad or failure about yourself   0 0 0   Trouble concentrating  2 1 0   Moving slowly or fidgety/restless  1 1 0   Suicidal thoughts  0 0 0   PHQ-9 Score   7  2    Difficult doing work/chores  Not difficult at all Somewhat difficult Not difficult at all       Data saved with a previous  flowsheet row definition    Review of Systems  Musculoskeletal:  Positive for back pain.  All other systems reviewed and are negative.      Objective:   Physical Exam  Masked facies Speech mild hypophonia Ambulates without assistive device no evidence of toe drag or knee instability Posture head forward posture. Right infrascapular lipoma no clear-cut tenderness around this area There is no pain with arm abduction reaching forward or bringing hands behind head and behind back. No pain with cervical spine range of motion No tenderness over the upper traps There is mild tenderness right lumbar paraspinal area      Assessment & Plan:  Assessment and Plan Assessment & Plan Chronic right-sided thoracic back pain Chronic episodic pain with unclear etiology, possibly postural and neuropathic. Prior MRI showed mild stenosis at T6, not correlating with symptoms. Gabapentin ineffective and caused cognitive issues. Surgical excision discussed but not planned. - Referred to physical therapy in [facility] for postural correction and strengthening, focusing on rowing-type exercises with TheraBand. - Instructed adherence to exercise regimen and therapy.  We discussed that continuing with a home exercise program will be very important to maintain upright posture and this is particularly important for patients with Parkinson's disease - Scheduled follow-up in six weeks to assess response and improvement. - Discussed pregabalin as future option if therapy fails, including titration and cognitive risks. - Reviewed that further imaging not indicated given prior MRI findings. - Discussed surgical excision as potential option, but benefit uncertain and not planned. Discussed plan with patient and his daughter who is with him today  "

## 2024-10-22 NOTE — Patient Instructions (Addendum)
" °  ° ° °  VISIT SUMMARY: You visited us  today to discuss your chronic episodic thoracic back pain, which has been ongoing for six to seven years. We reviewed your symptoms, previous treatments, and imaging results.  YOUR PLAN: CHRONIC RIGHT-SIDED THORACIC BACK PAIN: You have chronic episodic pain in your mid-thoracic spine, which may be related to posture or nerve issues. Previous MRI showed mild spinal stenosis at T6, but this does not fully explain your symptoms. -You are referred to physical therapy in [place] to focus on postural correction and strengthening exercises, particularly rowing-type exercises with a TheraBand. -Please adhere to the exercise regimen and therapy as instructed. -We will have a follow-up appointment in six weeks to assess your response and improvement. -If physical therapy does not help, we may consider trying pregabalin, but we will discuss the potential cognitive risks and how to properly adjust the dosage. -Further imaging is not needed at this time since your previous MRI did not show significant issues. -Surgical excision was discussed as a potential option, but it is not planned at this time due to uncertain benefits.                      Contains text generated by Abridge.                                 Contains text generated by Abridge.                Contains text generated by Abridge.                                 Contains text generated by Abridge.   "

## 2024-11-01 ENCOUNTER — Other Ambulatory Visit: Payer: Self-pay | Admitting: Nurse Practitioner

## 2024-11-01 ENCOUNTER — Ambulatory Visit: Attending: Physical Medicine & Rehabilitation | Admitting: Physical Therapy

## 2024-11-01 ENCOUNTER — Other Ambulatory Visit: Payer: Self-pay

## 2024-11-01 ENCOUNTER — Encounter: Payer: Self-pay | Admitting: Physical Therapy

## 2024-11-01 DIAGNOSIS — G8929 Other chronic pain: Secondary | ICD-10-CM | POA: Insufficient documentation

## 2024-11-01 DIAGNOSIS — R293 Abnormal posture: Secondary | ICD-10-CM | POA: Diagnosis present

## 2024-11-01 DIAGNOSIS — M546 Pain in thoracic spine: Secondary | ICD-10-CM | POA: Diagnosis present

## 2024-11-01 NOTE — Therapy (Signed)
 " OUTPATIENT PHYSICAL THERAPY THORACOLUMBAR EVALUATION   Patient Name: Edward Fisher MRN: [id number] DOB:[birth year], 58 y.o., male Today's Date: 11/01/2024  END OF SESSION:  PT End of Session - 11/01/24 1453     Visit Number 1    Number of Visits 10    Date for Recertification  12/13/24    PT Start Time 0230    PT Stop Time 0250    PT Time Calculation (min) 20 min    Activity Tolerance Patient tolerated treatment well    Behavior During Therapy [facility] for tasks assessed/performed          Past Medical History:  Diagnosis Date   Arteriosclerotic cardiovascular disease (ASCVD)    Onset in 04/2012; CABG in 06/2012 w/ LIMA-LAD, L Rad-CFX, SVG-PL-PDA   Hyperlipidemia    goal LDL < 70   Insomnia    Nephrolithiasis    Parkinson's disease (HCC)    Past Surgical History:  Procedure Laterality Date   COLONOSCOPY N/A 09/16/2024   Procedure: COLONOSCOPY;  Surgeon: Shaaron Lamar HERO, MD;  [facility];  Service: Endoscopy;  Laterality: N/A;  11:30 AM, OK RM 1-2   CORONARY ARTERY BYPASS GRAFT  06/24/2012   Procedure: CORONARY ARTERY BYPASS GRAFTING (CABG);  Surgeon: Dallas KATHEE Jude, MD;  Location: [facility] OR;  Service: Open Heart Surgery;  Laterality: N/A;  Coronary Artery  Bypass X 4 utilizing endoscopic saphenous vein graft and  Left radial Artery Harvest    CORONARY PRESSURE/FFR STUDY N/A 11/14/2023   Procedure: CORONARY PRESSURE/FFR STUDY;  Surgeon: Wendel Lurena POUR, MD;  [facility];  Service: Cardiovascular;  Laterality: N/A;   CYSTOSCOPY W/ RETROGRADES Left 08/23/2015   Procedure: CYSTOSCOPY WITH RETROGRADE PYELOGRAM;  Surgeon: Belvie LITTIE Clara, MD;  Location: [facility] ORS;  Service: Urology;  Laterality: Left;   CYSTOSCOPY WITH URETEROSCOPY, STONE BASKETRY AND STENT PLACEMENT Left 08/23/2015   Procedure: CYSTOSCOPY WITH URETEROSCOPY, STONE BASKET EXTRACTION;  Surgeon: Belvie LITTIE Clara, MD;  Location: [facility] ORS;  Service: Urology;  Laterality: Left;   HOLMIUM LASER  APPLICATION Left 08/23/2015   Procedure: HOLMIUM LASER APPLICATION;  Surgeon: Belvie LITTIE Clara, MD;  Location: [facility] ORS;  Service: Urology;  Laterality: Left;   LEFT HEART CATH AND CORS/GRAFTS ANGIOGRAPHY N/A 11/14/2023   Procedure: LEFT HEART CATH AND CORS/GRAFTS ANGIOGRAPHY;  Surgeon: Wendel Lurena POUR, MD;  [facility];  Service: Cardiovascular;  Laterality: N/A;   LEFT HEART CATHETERIZATION WITH CORONARY ANGIOGRAM N/A 06/23/2012   Procedure: LEFT HEART CATHETERIZATION WITH CORONARY ANGIOGRAM;  Surgeon: Ozell Fell, MD;  Location: [facility] CATH LAB;  Service: Cardiovascular;  Laterality: N/A;   RHINOPLASTY     TONSILLECTOMY  ~1971   Patient Active Problem List   Diagnosis Date Noted   Parkinson's disease (HCC) 09/22/2024   Insomnia due to medical condition 09/22/2024   Overweight (BMI 25.0-29.9) 09/22/2024   Vitamin D  deficiency 09/09/2024   Constipation 09/08/2024   Screen for colon cancer 09/08/2024   Dizziness on standing 06/23/2024   Lack of energy 06/23/2024   Unresolved grief 06/23/2024   Chronic thoracic back pain 06/10/2024   Encounter for general adult medical examination with abnormal findings 06/10/2024   Overweight with body mass index (BMI) of 28 to 28.9 in adult 06/10/2024   Nocturia 06/10/2024   Dysuria 06/10/2024   Other intervertebral disc degeneration, lumbar region 07/17/2023   S/P CABG (coronary artery bypass graft) 03/19/2023   Weakness 03/26/2022   Tremor of right hand 05/02/2020   Uncontrolled  REM sleep behavior disorder 05/02/2020   Hemiparesis of right dominant side (HCC) 05/02/2020   Chest pain 06/22/2014   Essential hypertension 06/22/2014   Belching 06/22/2014   Local skin infection 03/16/2013   Arteriosclerotic cardiovascular disease (ASCVD) 06/26/2012   Hyperlipidemia     REFERRING PROVIDER: Prentice Compton   REFERRING DIAG: Chronic right-side thoracic back pain.    Rationale for Evaluation and Treatment:  Rehabilitation  THERAPY DIAG:  Pain in thoracic spine - Plan: PT plan of care cert/re-cert  Abnormal posture - Plan: PT plan of care cert/re-cert  ONSET DATE: About 6 years ago or longer.  SUBJECTIVE:                                                                                                                                                                                           SUBJECTIVE STATEMENT: The patient presents to the clinic with c/o chronic right-sided thoracic back pain that radiation under his right shoulder blade.  At rest, today, he reports very little pain.  When he does housework or plays his Mandolin his pain increases to a 8-9/10. He describes his pain as ache and numb.  Sitting decreases his pain.    PERTINENT HISTORY:  Prior left shoulder injury, Parkinson.  Cyst removal (lower cervical/upper thoracic, right mid-thoracic).    PAIN:  Are you having pain? Yes: NPRS scale: Very little. Pain location: Right mid-back.   Pain description: As above.   Aggravating factors: As above. Relieving factors: As above.    PRECAUTIONS: None  RED FLAGS: None   WEIGHT BEARING RESTRICTIONS: No  FALLS:  Has patient fallen in last 6 months? No  LIVING ENVIRONMENT: Lives with: lives alone Lives in: House/apartment Has following equipment at home: None  OCCUPATION: PT for [employer].    PLOF: Independent  PATIENT GOALS: Not have pain.     OBJECTIVE:   DIAGNOSTIC FINDINGS:  IMPRESSION: No acute bony abnormality.  PATIENT SURVEYS:  NDI: 21/50.   POSTURE: rounded shoulders and forward head  PALPATION: Tender to palpation at T5 to T5 and right of this region.  Prior cyst removal area observed (remaining scar tissue?).     UE EXTREMITY ROM:    WNL.  UE STRENGTH:   Right Triceps 4/5 per contralateral comparison.  Right grip 65# and left is 85#.   TREATMENT DATE:  PATIENT EDUCATION:  Education details:  Person educated:  International aid/development worker:  Education comprehension:   HOME EXERCISE PROGRAM:   ASSESSMENT:  CLINICAL IMPRESSION: The patient presents to [facility] with c/o chronic right mid-back pain that radiates under his shoulder blade.  His pain reaches very high levels when performing ADL's.  He is tender to palpation at T5 to T5 and right of this region.  He is weaker into right elbow extension and grip per contralateral comparison.  His NDI score is 21/50.  Patient will benefir from skilled physical therapy intervention to address pain and deficits.    OBJECTIVE IMPAIRMENTS: decreased activity tolerance, postural dysfunction, and pain.   ACTIVITY LIMITATIONS: carrying, lifting, bending, and standing  PARTICIPATION LIMITATIONS: meal prep, cleaning, laundry, and yard work  PERSONAL FACTORS: Time since onset of injury/illness/exacerbation are also affecting patient's functional outcome.   REHAB POTENTIAL: Good  CLINICAL DECISION MAKING: Stable/uncomplicated  EVALUATION COMPLEXITY: Low   GOALS:  LONG TERM GOALS: Target date: 12/13/24.  Ind with a HEP.  Goal status: INITIAL  2.  Stand 20 minutes with pain not > 3/10.  Goal status: INITIAL  3.  Perform ADL's with pain not > 3-4/10.  Goal status: INITIAL  4.  Improve NDI score by at least 5 points.  Goal status: INITIAL   PLAN:  PT FREQUENCY/DURATION:  10 visits.  PLANNED INTERVENTIONS: 97110-Therapeutic exercises, 97530- Therapeutic activity, [id number]- Neuromuscular re-education, 97535- Self Care, 02859- Manual therapy, G0283- Electrical stimulation (unattended), [phone]- Ultrasound, Patient/Family education, Joint mobilization, Spinal mobilization, and Moist heat.  PLAN FOR NEXT SESSION: Postural exercises, Gentle mid-thoracic and costo-vertebral mobs.  STW/M and modalities as needed.     Nekoda Chock, PT 11/01/2024, 4:34 PM  "

## 2024-11-04 ENCOUNTER — Other Ambulatory Visit: Payer: Self-pay | Admitting: Nurse Practitioner

## 2024-11-09 ENCOUNTER — Ambulatory Visit: Attending: Physical Medicine & Rehabilitation | Admitting: Physical Therapy

## 2024-11-09 DIAGNOSIS — M546 Pain in thoracic spine: Secondary | ICD-10-CM | POA: Diagnosis present

## 2024-11-09 DIAGNOSIS — R293 Abnormal posture: Secondary | ICD-10-CM | POA: Insufficient documentation

## 2024-11-09 NOTE — Therapy (Signed)
 " OUTPATIENT PHYSICAL THERAPY THORACOLUMBAR TREATMENT  Patient Name: Edward Fisher MRN: [id number] DOB:September 20, 1966, 59 y.o., male Today's Date: 11/09/2024  END OF SESSION:  PT End of Session - 11/09/24 0947     Visit Number 2    Number of Visits 10    Date for Recertification  12/13/24    PT Start Time 0800    PT Stop Time 0855    PT Time Calculation (min) 55 min    Activity Tolerance Patient tolerated treatment well    Behavior During Therapy [facility] for tasks assessed/performed           Past Medical History:  Diagnosis Date   Arteriosclerotic cardiovascular disease (ASCVD)    Onset in 04/2012; CABG in 06/2012 w/ LIMA-LAD, L Rad-CFX, SVG-PL-PDA   Hyperlipidemia    goal LDL < 70   Insomnia    Nephrolithiasis    Parkinson's disease (HCC)    Past Surgical History:  Procedure Laterality Date   COLONOSCOPY N/A 09/16/2024   Procedure: COLONOSCOPY;  Surgeon: Shaaron Lamar HERO, MD;  [facility];  Service: Endoscopy;  Laterality: N/A;  11:30 AM, OK RM 1-2   CORONARY ARTERY BYPASS GRAFT  06/24/2012   Procedure: CORONARY ARTERY BYPASS GRAFTING (CABG);  Surgeon: Dallas KATHEE Jude, MD;  Location: [facility] OR;  Service: Open Heart Surgery;  Laterality: N/A;  Coronary Artery  Bypass X 4 utilizing endoscopic saphenous vein graft and  Left radial Artery Harvest    CORONARY PRESSURE/FFR STUDY N/A 11/14/2023   Procedure: CORONARY PRESSURE/FFR STUDY;  Surgeon: Wendel Lurena POUR, MD;  [facility];  Service: Cardiovascular;  Laterality: N/A;   CYSTOSCOPY W/ RETROGRADES Left 08/23/2015   Procedure: CYSTOSCOPY WITH RETROGRADE PYELOGRAM;  Surgeon: Belvie LITTIE Clara, MD;  Location: [facility];  Service: Urology;  Laterality: Left;   CYSTOSCOPY WITH URETEROSCOPY, STONE BASKETRY AND STENT PLACEMENT Left 08/23/2015   Procedure: CYSTOSCOPY WITH URETEROSCOPY, STONE BASKET EXTRACTION;  Surgeon: Belvie LITTIE Clara, MD;  Location: [facility];  Service: Urology;  Laterality: Left;   HOLMIUM LASER  APPLICATION Left 08/23/2015   Procedure: HOLMIUM LASER APPLICATION;  Surgeon: Belvie LITTIE Clara, MD;  Location: [facility];  Service: Urology;  Laterality: Left;   LEFT HEART CATH AND CORS/GRAFTS ANGIOGRAPHY N/A 11/14/2023   Procedure: LEFT HEART CATH AND CORS/GRAFTS ANGIOGRAPHY;  Surgeon: Wendel Lurena POUR, MD;  [facility];  Service: Cardiovascular;  Laterality: N/A;   LEFT HEART CATHETERIZATION WITH CORONARY ANGIOGRAM N/A 06/23/2012   Procedure: LEFT HEART CATHETERIZATION WITH CORONARY ANGIOGRAM;  Surgeon: Ozell Fell, MD;  Location: [facility] CATH LAB;  Service: Cardiovascular;  Laterality: N/A;   RHINOPLASTY     TONSILLECTOMY  ~1971   Patient Active Problem List   Diagnosis Date Noted   Parkinson's disease (HCC) 09/22/2024   Insomnia due to medical condition 09/22/2024   Overweight (BMI 25.0-29.9) 09/22/2024   Vitamin D  deficiency 09/09/2024   Constipation 09/08/2024   Screen for colon cancer 09/08/2024   Dizziness on standing 06/23/2024   Lack of energy 06/23/2024   Unresolved grief 06/23/2024   Chronic thoracic back pain 06/10/2024   Encounter for general adult medical examination with abnormal findings 06/10/2024   Overweight with body mass index (BMI) of 28 to 28.9 in adult 06/10/2024   Nocturia 06/10/2024   Dysuria 06/10/2024   Other intervertebral disc degeneration, lumbar region 07/17/2023   S/P CABG (coronary artery bypass graft) 03/19/2023   Weakness 03/26/2022   Tremor of right hand 05/02/2020   Uncontrolled  REM sleep behavior disorder 05/02/2020   Hemiparesis of right dominant side (HCC) 05/02/2020   Chest pain 06/22/2014   Essential hypertension 06/22/2014   Belching 06/22/2014   Local skin infection 03/16/2013   Arteriosclerotic cardiovascular disease (ASCVD) 06/26/2012   Hyperlipidemia     REFERRING PROVIDER: Prentice Compton   REFERRING DIAG: Chronic right-side thoracic back pain.    Rationale for Evaluation and Treatment:  Rehabilitation  THERAPY DIAG:  Pain in thoracic spine  Abnormal posture  ONSET DATE: About 6 years ago or longer.  SUBJECTIVE:                                                                                                                                                                                           SUBJECTIVE STATEMENT: No new complaints.  PERTINENT HISTORY:  Prior left shoulder injury, Parkinson.  Cyst removal (lower cervical/upper thoracic, right mid-thoracic).    PAIN:  Are you having pain? Yes: NPRS scale: Very little. Pain location: Right mid-back.   Pain description: As above.   Aggravating factors: As above. Relieving factors: As above.    PRECAUTIONS: None  RED FLAGS: None   WEIGHT BEARING RESTRICTIONS: No  FALLS:  Has patient fallen in last 6 months? No  LIVING ENVIRONMENT: Lives with: lives alone Lives in: House/apartment Has following equipment at home: None  OCCUPATION: PT for [employer].    PLOF: Independent  PATIENT GOALS: Not have pain.     OBJECTIVE:   DIAGNOSTIC FINDINGS:  IMPRESSION: No acute bony abnormality.  PATIENT SURVEYS:  NDI: 21/50.   POSTURE: rounded shoulders and forward head  PALPATION: Tender to palpation at T5 to T5 and right of this region.  Prior cyst removal area observed (remaining scar tissue?).     UE EXTREMITY ROM:    WNL.  UE STRENGTH:   Right Triceps 4/5 per contralateral comparison.  Right grip 65# and left is 85#.   TREATMENT DATE:  11/09/24:  UBE x 6 minutes at 120 RPM's f/b Scapular retraction with blue XTS high and mid level f/b patient in prone on treatment table and received STW/M and gentle mid-thoracic and costo-vertebral mobs x 15 minutes f/b HMP and IFC at 80-150 Hz on 40% scan x 20 minutes in supine.     PATIENT EDUCATION:  Education details: See  below. Person educated: Patient Education method: Demo, handout. Education comprehension: Very good.  HOME EXERCISE PROGRAM: HOME EXERCISE PROGRAM [[id number]]  Scapular Retraction -  Repeat 15 Repetitions, Hold 2 Seconds, Complete 2 Sets, Perform 2 Times a Day.    ASSESSMENT:  CLINICAL IMPRESSION: The patient tolerated treatment without complaint.  Provided patient with green theraband for HEP.  OBJECTIVE IMPAIRMENTS: decreased activity tolerance, postural dysfunction, and pain.   ACTIVITY LIMITATIONS: carrying, lifting, bending, and standing  PARTICIPATION LIMITATIONS: meal prep, cleaning, laundry, and yard work  PERSONAL FACTORS: Time since onset of injury/illness/exacerbation are also affecting patient's functional outcome.   REHAB POTENTIAL: Good  CLINICAL DECISION MAKING: Stable/uncomplicated  EVALUATION COMPLEXITY: Low   GOALS:  LONG TERM GOALS: Target date: 12/13/24.  Ind with a HEP.  Goal status: INITIAL  2.  Stand 20 minutes with pain not > 3/10.  Goal status: INITIAL  3.  Perform ADL's with pain not > 3-4/10.  Goal status: INITIAL  4.  Improve NDI score by at least 5 points.  Goal status: INITIAL   PLAN:  PT FREQUENCY/DURATION:  10 visits.  PLANNED INTERVENTIONS: 97110-Therapeutic exercises, 97530- Therapeutic activity, [id number]- Neuromuscular re-education, 97535- Self Care, 02859- Manual therapy, G0283- Electrical stimulation (unattended), [phone]- Ultrasound, Patient/Family education, Joint mobilization, Spinal mobilization, and Moist heat.  PLAN FOR NEXT SESSION: Postural exercises, Gentle mid-thoracic and costo-vertebral mobs.  STW/M and modalities as needed.     Aviraj Kentner, PT 11/09/2024, 9:47 AM  "

## 2024-11-10 ENCOUNTER — Ambulatory Visit: Admitting: Physical Therapy

## 2024-11-10 ENCOUNTER — Other Ambulatory Visit: Payer: Self-pay | Admitting: Cardiology

## 2024-11-10 DIAGNOSIS — M546 Pain in thoracic spine: Secondary | ICD-10-CM | POA: Diagnosis not present

## 2024-11-10 DIAGNOSIS — R293 Abnormal posture: Secondary | ICD-10-CM

## 2024-11-10 NOTE — Therapy (Signed)
 " OUTPATIENT PHYSICAL THERAPY THORACOLUMBAR TREATMENT  Patient Name: Edward Fisher MRN: [id number] DOB:1966/06/29, 59 y.o., male Today's Date: 11/10/2024  END OF SESSION:  PT End of Session - 11/10/24 0833     Visit Number 3    Number of Visits 10    Date for Recertification  12/13/24    PT Start Time 0800    PT Stop Time 0852    PT Time Calculation (min) 52 min    Activity Tolerance Patient tolerated treatment well    Behavior During Therapy [facility] for tasks assessed/performed           Past Medical History:  Diagnosis Date   Arteriosclerotic cardiovascular disease (ASCVD)    Onset in 04/2012; CABG in 06/2012 w/ LIMA-LAD, L Rad-CFX, SVG-PL-PDA   Hyperlipidemia    goal LDL < 70   Insomnia    Nephrolithiasis    Parkinson's disease (HCC)    Past Surgical History:  Procedure Laterality Date   COLONOSCOPY N/A 09/16/2024   Procedure: COLONOSCOPY;  Surgeon: Shaaron Lamar HERO, MD;  [facility];  Service: Endoscopy;  Laterality: N/A;  11:30 AM, OK RM 1-2   CORONARY ARTERY BYPASS GRAFT  06/24/2012   Procedure: CORONARY ARTERY BYPASS GRAFTING (CABG);  Surgeon: Dallas KATHEE Jude, MD;  Location: [facility] OR;  Service: Open Heart Surgery;  Laterality: N/A;  Coronary Artery  Bypass X 4 utilizing endoscopic saphenous vein graft and  Left radial Artery Harvest    CORONARY PRESSURE/FFR STUDY N/A 11/14/2023   Procedure: CORONARY PRESSURE/FFR STUDY;  Surgeon: Wendel Lurena POUR, MD;  [facility];  Service: Cardiovascular;  Laterality: N/A;   CYSTOSCOPY W/ RETROGRADES Left 08/23/2015   Procedure: CYSTOSCOPY WITH RETROGRADE PYELOGRAM;  Surgeon: Belvie LITTIE Clara, MD;  Location: [facility] ORS;  Service: Urology;  Laterality: Left;   CYSTOSCOPY WITH URETEROSCOPY, STONE BASKETRY AND STENT PLACEMENT Left 08/23/2015   Procedure: CYSTOSCOPY WITH URETEROSCOPY, STONE BASKET EXTRACTION;  Surgeon: Belvie LITTIE Clara, MD;  Location: [facility] ORS;  Service: Urology;  Laterality: Left;   HOLMIUM LASER  APPLICATION Left 08/23/2015   Procedure: HOLMIUM LASER APPLICATION;  Surgeon: Belvie LITTIE Clara, MD;  Location: [facility] ORS;  Service: Urology;  Laterality: Left;   LEFT HEART CATH AND CORS/GRAFTS ANGIOGRAPHY N/A 11/14/2023   Procedure: LEFT HEART CATH AND CORS/GRAFTS ANGIOGRAPHY;  Surgeon: Wendel Lurena POUR, MD;  [facility];  Service: Cardiovascular;  Laterality: N/A;   LEFT HEART CATHETERIZATION WITH CORONARY ANGIOGRAM N/A 06/23/2012   Procedure: LEFT HEART CATHETERIZATION WITH CORONARY ANGIOGRAM;  Surgeon: Ozell Fell, MD;  Location: [facility] CATH LAB;  Service: Cardiovascular;  Laterality: N/A;   RHINOPLASTY     TONSILLECTOMY  ~1971   Patient Active Problem List   Diagnosis Date Noted   Parkinson's disease (HCC) 09/22/2024   Insomnia due to medical condition 09/22/2024   Overweight (BMI 25.0-29.9) 09/22/2024   Vitamin D  deficiency 09/09/2024   Constipation 09/08/2024   Screen for colon cancer 09/08/2024   Dizziness on standing 06/23/2024   Lack of energy 06/23/2024   Unresolved grief 06/23/2024   Chronic thoracic back pain 06/10/2024   Encounter for general adult medical examination with abnormal findings 06/10/2024   Overweight with body mass index (BMI) of 28 to 28.9 in adult 06/10/2024   Nocturia 06/10/2024   Dysuria 06/10/2024   Other intervertebral disc degeneration, lumbar region 07/17/2023   S/P CABG (coronary artery bypass graft) 03/19/2023   Weakness 03/26/2022   Tremor of right hand 05/02/2020   Uncontrolled  REM sleep behavior disorder 05/02/2020   Hemiparesis of right dominant side (HCC) 05/02/2020   Chest pain 06/22/2014   Essential hypertension 06/22/2014   Belching 06/22/2014   Local skin infection 03/16/2013   Arteriosclerotic cardiovascular disease (ASCVD) 06/26/2012   Hyperlipidemia     REFERRING PROVIDER: Prentice Compton   REFERRING DIAG: Chronic right-side thoracic back pain.    Rationale for Evaluation and Treatment:  Rehabilitation  THERAPY DIAG:  Pain in thoracic spine  Abnormal posture  ONSET DATE: About 6 years ago or longer.  SUBJECTIVE:                                                                                                                                                                                           SUBJECTIVE STATEMENT: Pain not too bad today.    PERTINENT HISTORY:  Prior left shoulder injury, Parkinson.  Cyst removal (lower cervical/upper thoracic, right mid-thoracic).    PAIN:  Are you having pain? Yes: NPRS scale: Very little. Pain location: Right mid-back.   Pain description: As above.   Aggravating factors: As above. Relieving factors: As above.    PRECAUTIONS: None  RED FLAGS: None   WEIGHT BEARING RESTRICTIONS: No  FALLS:  Has patient fallen in last 6 months? No  LIVING ENVIRONMENT: Lives with: lives alone Lives in: House/apartment Has following equipment at home: None  OCCUPATION: PT for [employer].    PLOF: Independent  PATIENT GOALS: Not have pain.     OBJECTIVE:   DIAGNOSTIC FINDINGS:  IMPRESSION: No acute bony abnormality.  PATIENT SURVEYS:  NDI: 21/50.   POSTURE: rounded shoulders and forward head  PALPATION: Tender to palpation at T5 to T5 and right of this region.  Prior cyst removal area observed (remaining scar tissue?).     UE EXTREMITY ROM:    WNL.  UE STRENGTH:   Right Triceps 4/5 per contralateral comparison.  Right grip 65# and left is 85#.   TREATMENT DATE:  11/10/24:  UBE x 8 minutes f/b patient in prone on treatment table and received STW/M and gentle mid-thoracic and costo-vertebral mobs x 15 minutes f/b HMP and IFC at 80-150 Hz on 40% scan x 20 minutes in supine.  11/09/24:  UBE x 6 minutes at 120 RPM's f/b Scapular retraction with blue XTS high and mid level f/b patient in  prone on treatment table and received STW/M and gentle mid-thoracic and costo-vertebral mobs x 15 minutes f/b HMP and IFC at 80-150 Hz on 40% scan x 20 minutes in supine.     PATIENT EDUCATION:  Education details: See below. Person educated: Patient Education method: Demo, handout. Education comprehension: Very good.  HOME EXERCISE PROGRAM: HOME EXERCISE PROGRAM [[id number]]  Scapular Retraction -  Repeat 15 Repetitions, Hold 2 Seconds, Complete 2 Sets, Perform 2 Times a Day.    ASSESSMENT:  CLINICAL IMPRESSION: Patient had a good response to last treatment and tolerated treatment today without complaint.    OBJECTIVE IMPAIRMENTS: decreased activity tolerance, postural dysfunction, and pain.   ACTIVITY LIMITATIONS: carrying, lifting, bending, and standing  PARTICIPATION LIMITATIONS: meal prep, cleaning, laundry, and yard work  PERSONAL FACTORS: Time since onset of injury/illness/exacerbation are also affecting patient's functional outcome.   REHAB POTENTIAL: Good  CLINICAL DECISION MAKING: Stable/uncomplicated  EVALUATION COMPLEXITY: Low   GOALS:  LONG TERM GOALS: Target date: 12/13/24.  Ind with a HEP.  Goal status: INITIAL  2.  Stand 20 minutes with pain not > 3/10.  Goal status: INITIAL  3.  Perform ADL's with pain not > 3-4/10.  Goal status: INITIAL  4.  Improve NDI score by at least 5 points.  Goal status: INITIAL   PLAN:  PT FREQUENCY/DURATION:  10 visits.  PLANNED INTERVENTIONS: 97110-Therapeutic exercises, 97530- Therapeutic activity, [id number]- Neuromuscular re-education, 97535- Self Care, 02859- Manual therapy, G0283- Electrical stimulation (unattended), [phone]- Ultrasound, Patient/Family education, Joint mobilization, Spinal mobilization, and Moist heat.  PLAN FOR NEXT SESSION: Postural exercises, Gentle mid-thoracic and costo-vertebral mobs.  STW/M and modalities as needed.     Skyla Champagne, PT 11/10/2024, 9:15 AM  "

## 2024-11-17 ENCOUNTER — Ambulatory Visit

## 2024-11-17 DIAGNOSIS — M546 Pain in thoracic spine: Secondary | ICD-10-CM | POA: Diagnosis not present

## 2024-11-17 DIAGNOSIS — R293 Abnormal posture: Secondary | ICD-10-CM

## 2024-11-17 NOTE — Therapy (Signed)
 " OUTPATIENT PHYSICAL THERAPY THORACOLUMBAR TREATMENT  Patient Name: Edward Fisher MRN: [id number] DOB:1966/07/04, 59 y.o., male Today's Date: 11/17/2024  END OF SESSION:  PT End of Session - 11/17/24 0849     Visit Number 4    Number of Visits 10    Date for Recertification  12/13/24    PT Start Time 0845    PT Stop Time 0945    PT Time Calculation (min) 60 min    Activity Tolerance Patient tolerated treatment well    Behavior During Therapy [facility] for tasks assessed/performed           Past Medical History:  Diagnosis Date   Arteriosclerotic cardiovascular disease (ASCVD)    Onset in 04/2012; CABG in 06/2012 w/ LIMA-LAD, L Rad-CFX, SVG-PL-PDA   Hyperlipidemia    goal LDL < 70   Insomnia    Nephrolithiasis    Parkinson's disease (HCC)    Past Surgical History:  Procedure Laterality Date   COLONOSCOPY N/A 09/16/2024   Procedure: COLONOSCOPY;  Surgeon: Shaaron Lamar HERO, MD;  [facility];  Service: Endoscopy;  Laterality: N/A;  11:30 AM, OK RM 1-2   CORONARY ARTERY BYPASS GRAFT  06/24/2012   Procedure: CORONARY ARTERY BYPASS GRAFTING (CABG);  Surgeon: Dallas KATHEE Jude, MD;  Location[facility] OR;  Service: Open Heart Surgery;  Laterality: N/A;  Coronary Artery  Bypass X 4 utilizing endoscopic saphenous vein graft and  Left radial Artery Harvest    CORONARY PRESSURE/FFR STUDY N/A 11/14/2023   Procedure: CORONARY PRESSURE/FFR STUDY;  Surgeon: Wendel Lurena POUR, MD;  [facility]: [facility];  Service: Cardiovascular;  Laterality: N/A;   CYSTOSCOPY W/ RETROGRADES Left 08/23/2015   Procedure: CYSTOSCOPY WITH RETROGRADE PYELOGRAM;  Surgeon: Belvie LITTIE Clara, MD;  Location: [facility] ORS;  Service: Urology;  Laterality: Left;   CYSTOSCOPY WITH URETEROSCOPY, STONE BASKETRY AND STENT PLACEMENT Left 08/23/2015   Procedure: CYSTOSCOPY WITH URETEROSCOPY, STONE BASKET EXTRACTION;  Surgeon: Belvie LITTIE Clara, MD;  Location: [facility];  Service: Urology;  Laterality: Left;   HOLMIUM LASER  APPLICATION Left 08/23/2015   Procedure: HOLMIUM LASER APPLICATION;  Surgeon: Belvie LITTIE Clara, MD;  Location: [facility];  Service: Urology;  Laterality: Left;   LEFT HEART CATH AND CORS/GRAFTS ANGIOGRAPHY N/A 11/14/2023   Procedure: LEFT HEART CATH AND CORS/GRAFTS ANGIOGRAPHY;  Surgeon: Wendel Lurena POUR, MD;  Location: [facility];  Service: Cardiovascular;  Laterality: N/A;   LEFT HEART CATHETERIZATION WITH CORONARY ANGIOGRAM N/A 06/23/2012   Procedure: LEFT HEART CATHETERIZATION WITH CORONARY ANGIOGRAM;  Surgeon: Ozell Fell, MD;  Location: [facility] CATH LAB;  Service: Cardiovascular;  Laterality: N/A;   RHINOPLASTY     TONSILLECTOMY  ~1971   Patient Active Problem List   Diagnosis Date Noted   Parkinson's disease (HCC) 09/22/2024   Insomnia due to medical condition 09/22/2024   Overweight (BMI 25.0-29.9) 09/22/2024   Vitamin D  deficiency 09/09/2024   Constipation 09/08/2024   Screen for colon cancer 09/08/2024   Dizziness on standing 06/23/2024   Lack of energy 06/23/2024   Unresolved grief 06/23/2024   Chronic thoracic back pain 06/10/2024   Encounter for general adult medical examination with abnormal findings 06/10/2024   Overweight with body mass index (BMI) of 28 to 28.9 in adult 06/10/2024   Nocturia 06/10/2024   Dysuria 06/10/2024   Other intervertebral disc degeneration, lumbar region 07/17/2023   S/P CABG (coronary artery bypass graft) 03/19/2023   Weakness 03/26/2022   Tremor of right hand 05/02/2020   Uncontrolled  REM sleep behavior disorder 05/02/2020   Hemiparesis of right dominant side (HCC) 05/02/2020   Chest pain 06/22/2014   Essential hypertension 06/22/2014   Belching 06/22/2014   Local skin infection 03/16/2013   Arteriosclerotic cardiovascular disease (ASCVD) 06/26/2012   Hyperlipidemia     REFERRING PROVIDER: Prentice Compton   REFERRING DIAG: Chronic right-side thoracic back pain.    Rationale for Evaluation and Treatment:  Rehabilitation  THERAPY DIAG:  Pain in thoracic spine  Abnormal posture  ONSET DATE: About 6 years ago or longer.  SUBJECTIVE:                                                                                                                                                                                           SUBJECTIVE STATEMENT: Pt reports 2-3/10 mid back pain today.    PERTINENT HISTORY:  Prior left shoulder injury, Parkinson.  Cyst removal (lower cervical/upper thoracic, right mid-thoracic).    PAIN:  Are you having pain? Yes: NPRS scale: 2-3/10 Pain location: Right mid-back.   Pain description: As above.   Aggravating factors: As above. Relieving factors: As above.    PRECAUTIONS: None  RED FLAGS: None   WEIGHT BEARING RESTRICTIONS: No  FALLS:  Has patient fallen in last 6 months? No  LIVING ENVIRONMENT: Lives with: lives alone Lives in: House/apartment Has following equipment at home: None  OCCUPATION: PT for [employer].    PLOF: Independent  PATIENT GOALS: Not have pain.     OBJECTIVE:   DIAGNOSTIC FINDINGS:  IMPRESSION: No acute bony abnormality.  PATIENT SURVEYS:  NDI: 21/50.   POSTURE: rounded shoulders and forward head  PALPATION: Tender to palpation at T5 to T5 and right of this region.  Prior cyst removal area observed (remaining scar tissue?).     UE EXTREMITY ROM:    WNL.  UE STRENGTH:   Right Triceps 4/5 per contralateral comparison.  Right grip 65# and left is 85#.   TREATMENT DATE:  11/17/24                                  EXERCISE LOG  Exercise Repetitions and Resistance Comments  UBE 10 mins (forward/backward)   Scap Retractions 20 reps    Shoulder Circles 20 reps each way   Rows Red x 20 reps   Extension Red x 20 reps   ER Red x 20 reps   Horizontal Abduction Red x 20 reps        Blank cell = exercise not performed today   Manual Therapy Soft Tissue Mobilization: Right Thoracic  Spine, STW/M to right  thoracic paraspinals and other musculature to decrease pain and tone.    Modalities  Date:  Unattended Estim: Thoracic spine, IFC 80-150 Hz, 15 mins, Pain and Tone Hot Pack: Thoracic Spine, 15 mins, Pain and Tone    11/10/24:  UBE x 8 minutes f/b patient in prone on treatment table and received STW/M and gentle mid-thoracic and costo-vertebral mobs x 15 minutes f/b HMP and IFC at 80-150 Hz on 40% scan x 20 minutes in supine.                                                                                                                                  11/09/24:  UBE x 6 minutes at 120 RPM's f/b Scapular retraction with blue XTS high and mid level f/b patient in prone on treatment table and received STW/M and gentle mid-thoracic and costo-vertebral mobs x 15 minutes f/b HMP and IFC at 80-150 Hz on 40% scan x 20 minutes in supine.     PATIENT EDUCATION:  Education details: See below. Person educated: Patient Education method: Demo, handout. Education comprehension: Very good.  HOME EXERCISE PROGRAM: HOME EXERCISE PROGRAM [[id number]]  Scapular Retraction -  Repeat 15 Repetitions, Hold 2 Seconds, Complete 2 Sets, Perform 2 Times a Day.    ASSESSMENT:  CLINICAL IMPRESSION: Pt arrives for today's treatment session reporting 2-3/10 right thoracic spine pain.  Pt able to tolerate increased time on the UBE today.  Pt instructed in seated postural exercises with min cues required for proper technique and posture.  STW/M performed to right thoracic spine to decrease pain and tone.  Normal responses to estim and MH noted upon removal.  Pt reported decreased pain at completion of today's treatment session.   OBJECTIVE IMPAIRMENTS: decreased activity tolerance, postural dysfunction, and pain.   ACTIVITY LIMITATIONS: carrying, lifting, bending, and standing  PARTICIPATION LIMITATIONS: meal prep, cleaning, laundry, and yard work  PERSONAL FACTORS: Time since onset of injury/illness/exacerbation are  also affecting patient's functional outcome.   REHAB POTENTIAL: Good  CLINICAL DECISION MAKING: Stable/uncomplicated  EVALUATION COMPLEXITY: Low   GOALS:  LONG TERM GOALS: Target date: 12/13/24.  Ind with a HEP.  Goal status: INITIAL  2.  Stand 20 minutes with pain not > 3/10.  Goal status: INITIAL  3.  Perform ADL's with pain not > 3-4/10.  Goal status: INITIAL  4.  Improve NDI score by at least 5 points.  Goal status: INITIAL   PLAN:  PT FREQUENCY/DURATION:  10 visits.  PLANNED INTERVENTIONS: 97110-Therapeutic exercises, 97530- Therapeutic activity, [id number]- Neuromuscular re-education, 97535- Self Care, 02859- Manual therapy, G0283- Electrical stimulation (unattended), [phone]- Ultrasound, Patient/Family education, Joint mobilization, Spinal mobilization, and Moist heat.  PLAN FOR NEXT SESSION: Postural exercises, Gentle mid-thoracic and costo-vertebral mobs.  STW/M and modalities as needed.     Delon DELENA Gosling, PTA 11/17/2024, 10:28 AM  "

## 2024-11-18 ENCOUNTER — Encounter: Payer: Self-pay | Admitting: *Deleted

## 2024-11-18 ENCOUNTER — Ambulatory Visit: Admitting: *Deleted

## 2024-11-18 DIAGNOSIS — M546 Pain in thoracic spine: Secondary | ICD-10-CM | POA: Diagnosis not present

## 2024-11-18 DIAGNOSIS — R293 Abnormal posture: Secondary | ICD-10-CM

## 2024-11-18 NOTE — Therapy (Signed)
 " OUTPATIENT PHYSICAL THERAPY THORACOLUMBAR TREATMENT  Patient Name: Edward Fisher MRN: [id number] DOB:09-Feb-1966, 59 y.o., male Today's Date: 11/18/2024  END OF SESSION:  PT End of Session - 11/18/24 0902     Visit Number 5    Number of Visits 10    Date for Recertification  12/13/24    PT Start Time 0845    PT Stop Time 0944    PT Time Calculation (min) 59 min           Past Medical History:  Diagnosis Date   Arteriosclerotic cardiovascular disease (ASCVD)    Onset in 04/2012; CABG in 06/2012 w/ LIMA-LAD, L Rad-CFX, SVG-PL-PDA   Hyperlipidemia    goal LDL < 70   Insomnia    Nephrolithiasis    Parkinson's disease (HCC)    Past Surgical History:  Procedure Laterality Date   COLONOSCOPY N/A 09/16/2024   Procedure: COLONOSCOPY;  Surgeon: Shaaron Lamar HERO, MD;  [facility];  Service: Endoscopy;  Laterality: N/A;  11:30 AM, OK RM 1-2   CORONARY ARTERY BYPASS GRAFT  06/24/2012   Procedure: CORONARY ARTERY BYPASS GRAFTING (CABG);  Surgeon: Dallas KATHEE Jude, MD;  Location: [facility] OR;  Service: Open Heart Surgery;  Laterality: N/A;  Coronary Artery  Bypass X 4 utilizing endoscopic saphenous vein graft and  Left radial Artery Harvest    CORONARY PRESSURE/FFR STUDY N/A 11/14/2023   Procedure: CORONARY PRESSURE/FFR STUDY;  Surgeon: Wendel Lurena POUR, MD;  [facility];  Service: Cardiovascular;  Laterality: N/A;   CYSTOSCOPY W/ RETROGRADES Left 08/23/2015   Procedure: CYSTOSCOPY WITH RETROGRADE PYELOGRAM;  Surgeon: Belvie LITTIE Clara, MD;  Location: [facility];  Service: Urology;  Laterality: Left;   CYSTOSCOPY WITH URETEROSCOPY, STONE BASKETRY AND STENT PLACEMENT Left 08/23/2015   Procedure: CYSTOSCOPY WITH URETEROSCOPY, STONE BASKET EXTRACTION;  Surgeon: Belvie LITTIE Clara, MD;  Location: [facility];  Service: Urology;  Laterality: Left;   HOLMIUM LASER APPLICATION Left 08/23/2015   Procedure: HOLMIUM LASER APPLICATION;  Surgeon: Belvie LITTIE Clara, MD;  Location: [facility];   Service: Urology;  Laterality: Left;   LEFT HEART CATH AND CORS/GRAFTS ANGIOGRAPHY N/A 11/14/2023   Procedure: LEFT HEART CATH AND CORS/GRAFTS ANGIOGRAPHY;  Surgeon: Wendel Lurena POUR, MD;  [facility]: [facility];  Service: Cardiovascular;  Laterality: N/A;   LEFT HEART CATHETERIZATION WITH CORONARY ANGIOGRAM N/A 06/23/2012   Procedure: LEFT HEART CATHETERIZATION WITH CORONARY ANGIOGRAM;  Surgeon: Ozell Fell, MD;  Location: [facility] CATH LAB;  Service: Cardiovascular;  Laterality: N/A;   RHINOPLASTY     TONSILLECTOMY  ~1971   Patient Active Problem List   Diagnosis Date Noted   Parkinson's disease (HCC) 09/22/2024   Insomnia due to medical condition 09/22/2024   Overweight (BMI 25.0-29.9) 09/22/2024   Vitamin D  deficiency 09/09/2024   Constipation 09/08/2024   Screen for colon cancer 09/08/2024   Dizziness on standing 06/23/2024   Lack of energy 06/23/2024   Unresolved grief 06/23/2024   Chronic thoracic back pain 06/10/2024   Encounter for general adult medical examination with abnormal findings 06/10/2024   Overweight with body mass index (BMI) of 28 to 28.9 in adult 06/10/2024   Nocturia 06/10/2024   Dysuria 06/10/2024   Other intervertebral disc degeneration, lumbar region 07/17/2023   S/P CABG (coronary artery bypass graft) 03/19/2023   Weakness 03/26/2022   Tremor of right hand 05/02/2020   Uncontrolled REM sleep behavior disorder 05/02/2020   Hemiparesis of right dominant side (HCC) 05/02/2020   Chest pain 06/22/2014  Essential hypertension 06/22/2014   Belching 06/22/2014   Local skin infection 03/16/2013   Arteriosclerotic cardiovascular disease (ASCVD) 06/26/2012   Hyperlipidemia     REFERRING PROVIDER: Prentice Compton   REFERRING DIAG: Chronic right-side thoracic back pain.    Rationale for Evaluation and Treatment: Rehabilitation  THERAPY DIAG:  Pain in thoracic spine  Abnormal posture  ONSET DATE: About 6 years ago or longer.  SUBJECTIVE:                                                                                                                                                                                            SUBJECTIVE STATEMENT: Pt reports 2-3/10 mid back pain today.    PERTINENT HISTORY:  Prior left shoulder injury, Parkinson.  Cyst removal (lower cervical/upper thoracic, right mid-thoracic).    PAIN:  Are you having pain? Yes: NPRS scale: 2-3/10 Pain location: Right mid-back.   Pain description: As above.   Aggravating factors: As above. Relieving factors: As above.    PRECAUTIONS: None  RED FLAGS: None   WEIGHT BEARING RESTRICTIONS: No  FALLS:  Has patient fallen in last 6 months? No  LIVING ENVIRONMENT: Lives with: lives alone Lives in: House/apartment Has following equipment at home: None  OCCUPATION: PT for [employer].    PLOF: Independent  PATIENT GOALS: Not have pain.     OBJECTIVE:   DIAGNOSTIC FINDINGS:  IMPRESSION: No acute bony abnormality.  PATIENT SURVEYS:  NDI: 21/50.   POSTURE: rounded shoulders and forward head  PALPATION: Tender to palpation at T5 to T5 and right of this region.  Prior cyst removal area observed (remaining scar tissue?).     UE EXTREMITY ROM:    WNL.  UE STRENGTH:   Right Triceps 4/5 per contralateral comparison.  Right grip 65# and left is 85#.   TREATMENT DATE:  11/17/24                                  EXERCISE LOG  Exercise Repetitions and Resistance Comments  UBE 10 mins (forward/backward)   Scap Retractions 20 reps    Shoulder Circles    Rows Green x 20 reps   Extension Green  x 20 reps   ER Red x 20 reps    T's  and  Y's Greenx 20 reps        Blank cell = exercise not performed today   Manual Therapy Soft Tissue Mobilization: Right Thoracic Spine, STW/M to right thoracic paraspinals and other musculature to decrease pain and tone in LT SL  Modalities  Date:  Unattended Estim: Thoracic spine, IFC 80-150 Hz, 15 mins, Pain  and Tone Hot Pack: Thoracic Spine, 15 mins, Pain and Tone    11/10/24:  UBE x 8 minutes f/b patient in prone on treatment table and received STW/M and gentle mid-thoracic and costo-vertebral mobs x 15 minutes f/b HMP and IFC at 80-150 Hz on 40% scan x 20 minutes in supine.                                                                                                                                  11/09/24:  UBE x 6 minutes at 120 RPM's f/b Scapular retraction with blue XTS high and mid level f/b patient in prone on treatment table and received STW/M and gentle mid-thoracic and costo-vertebral mobs x 15 minutes f/b HMP and IFC at 80-150 Hz on 40% scan x 20 minutes in supine.     PATIENT EDUCATION:  Education details: See below. Person educated: Patient Education method: Demo, handout. Education comprehension: Very good.  HOME EXERCISE PROGRAM: HOME EXERCISE PROGRAM [[id number]]  Scapular Retraction -  Repeat 15 Repetitions, Hold 2 Seconds, Complete 2 Sets, Perform 2 Times a Day.    ASSESSMENT:  CLINICAL IMPRESSION: Pt arrives for today's treatment session reporting 2-3/10 right thoracic spine pain.  Pt  was able to continue with posture exs  and progressions with  for proper technique and posture. Manual  STW/M was performed again  to right thoracic spine paras to decrease pain and tone. IFC end of session.  Normal responses to estim and MH noted upon removal.  Pt reported decreased pain at completion of today's treatment session.   OBJECTIVE IMPAIRMENTS: decreased activity tolerance, postural dysfunction, and pain.   ACTIVITY LIMITATIONS: carrying, lifting, bending, and standing  PARTICIPATION LIMITATIONS: meal prep, cleaning, laundry, and yard work  PERSONAL FACTORS: Time since onset of injury/illness/exacerbation are also affecting patient's functional outcome.   REHAB POTENTIAL: Good  CLINICAL DECISION MAKING: Stable/uncomplicated  EVALUATION COMPLEXITY: Low   GOALS:  LONG  TERM GOALS: Target date: 12/13/24.  Ind with a HEP.  Goal status: INITIAL  2.  Stand 20 minutes with pain not > 3/10.  Goal status: INITIAL  3.  Perform ADL's with pain not > 3-4/10.  Goal status: INITIAL  4.  Improve NDI score by at least 5 points.  Goal status: INITIAL   PLAN:  PT FREQUENCY/DURATION:  10 visits.  PLANNED INTERVENTIONS: 97110-Therapeutic exercises, 97530- Therapeutic activity, [id number]- Neuromuscular re-education, 97535- Self Care, 02859- Manual therapy, G0283- Electrical stimulation (unattended), [id number]- Ultrasound, Patient/Family education, Joint mobilization, Spinal mobilization, and Moist heat.  PLAN FOR NEXT SESSION: Postural exercises, Gentle mid-thoracic and costo-vertebral mobs.  STW/M and modalities as needed.     Febe Champa,CHRIS, PTA 11/18/2024, 2:30 PM  "

## 2024-11-23 ENCOUNTER — Ambulatory Visit: Admitting: Physical Therapy

## 2024-11-23 DIAGNOSIS — M546 Pain in thoracic spine: Secondary | ICD-10-CM

## 2024-11-23 DIAGNOSIS — R293 Abnormal posture: Secondary | ICD-10-CM

## 2024-11-23 NOTE — Therapy (Signed)
 " OUTPATIENT PHYSICAL THERAPY THORACOLUMBAR TREATMENT  Patient Name: Edward Fisher MRN: [id number] DOB:25-Jun-1966, 59 y.o., male Today's Date: 11/23/2024  END OF SESSION:  PT End of Session - 11/23/24 1419     Visit Number 6    Number of Visits 10    Date for Recertification  12/13/24    PT Start Time 0218    PT Stop Time 0321    PT Time Calculation (min) 63 min    Activity Tolerance Patient tolerated treatment well    Behavior During Therapy [facility] for tasks assessed/performed           Past Medical History:  Diagnosis Date   Arteriosclerotic cardiovascular disease (ASCVD)    Onset in 04/2012; CABG in 06/2012 w/ LIMA-LAD, L Rad-CFX, SVG-PL-PDA   Hyperlipidemia    goal LDL < 70   Insomnia    Nephrolithiasis    Parkinson's disease (HCC)    Past Surgical History:  Procedure Laterality Date   COLONOSCOPY N/A 09/16/2024   Procedure: COLONOSCOPY;  Surgeon: Shaaron Lamar HERO, MD;  [facility]: [facility];  Service: Endoscopy;  Laterality: N/A;  11:30 AM, OK RM 1-2   CORONARY ARTERY BYPASS GRAFT  06/24/2012   Procedure: CORONARY ARTERY BYPASS GRAFTING (CABG);  Surgeon: Dallas KATHEE Jude, MD;  Location: [facility] OR;  Service: Open Heart Surgery;  Laterality: N/A;  Coronary Artery  Bypass X 4 utilizing endoscopic saphenous vein graft and  Left radial Artery Harvest    CORONARY PRESSURE/FFR STUDY N/A 11/14/2023   Procedure: CORONARY PRESSURE/FFR STUDY;  Surgeon: Wendel Lurena POUR, MD;  [facility]: [facility];  Service: Cardiovascular;  Laterality: N/A;   CYSTOSCOPY W/ RETROGRADES Left 08/23/2015   Procedure: CYSTOSCOPY WITH RETROGRADE PYELOGRAM;  Surgeon: Belvie LITTIE Clara, MD;  Location: [facility];  Service: Urology;  Laterality: Left;   CYSTOSCOPY WITH URETEROSCOPY, STONE BASKETRY AND STENT PLACEMENT Left 08/23/2015   Procedure: CYSTOSCOPY WITH URETEROSCOPY, STONE BASKET EXTRACTION;  Surgeon: Belvie LITTIE Clara, MD;  Location: [facility];  Service: Urology;  Laterality: Left;   HOLMIUM LASER  APPLICATION Left 08/23/2015   Procedure: HOLMIUM LASER APPLICATION;  Surgeon: Belvie LITTIE Clara, MD;  Location: [facility];  Service: Urology;  Laterality: Left;   LEFT HEART CATH AND CORS/GRAFTS ANGIOGRAPHY N/A 11/14/2023   Procedure: LEFT HEART CATH AND CORS/GRAFTS ANGIOGRAPHY;  Surgeon: Wendel Lurena POUR, MD;  [facility]: [facility];  Service: Cardiovascular;  Laterality: N/A;   LEFT HEART CATHETERIZATION WITH CORONARY ANGIOGRAM N/A 06/23/2012   Procedure: LEFT HEART CATHETERIZATION WITH CORONARY ANGIOGRAM;  Surgeon: Ozell Fell, MD;  Location: [facility] CATH LAB;  Service: Cardiovascular;  Laterality: N/A;   RHINOPLASTY     TONSILLECTOMY  ~1971   Patient Active Problem List   Diagnosis Date Noted   Parkinson's disease (HCC) 09/22/2024   Insomnia due to medical condition 09/22/2024   Overweight (BMI 25.0-29.9) 09/22/2024   Vitamin D  deficiency 09/09/2024   Constipation 09/08/2024   Screen for colon cancer 09/08/2024   Dizziness on standing 06/23/2024   Lack of energy 06/23/2024   Unresolved grief 06/23/2024   Chronic thoracic back pain 06/10/2024   Encounter for general adult medical examination with abnormal findings 06/10/2024   Overweight with body mass index (BMI) of 28 to 28.9 in adult 06/10/2024   Nocturia 06/10/2024   Dysuria 06/10/2024   Other intervertebral disc degeneration, lumbar region 07/17/2023   S/P CABG (coronary artery bypass graft) 03/19/2023   Weakness 03/26/2022   Tremor of right hand 05/02/2020   Uncontrolled  REM sleep behavior disorder 05/02/2020   Hemiparesis of right dominant side (HCC) 05/02/2020   Chest pain 06/22/2014   Essential hypertension 06/22/2014   Belching 06/22/2014   Local skin infection 03/16/2013   Arteriosclerotic cardiovascular disease (ASCVD) 06/26/2012   Hyperlipidemia     REFERRING PROVIDER: Prentice Compton   REFERRING DIAG: Chronic right-side thoracic back pain.    Rationale for Evaluation and Treatment:  Rehabilitation  THERAPY DIAG:  Pain in thoracic spine  Abnormal posture  ONSET DATE: About 6 years ago or longer.  SUBJECTIVE:                                                                                                                                                                                           SUBJECTIVE STATEMENT: Pian low at rest but increase when I do something.  PERTINENT HISTORY:  Prior left shoulder injury, Parkinson.  Cyst removal (lower cervical/upper thoracic, right mid-thoracic).    PAIN:  Are you having pain? Yes: NPRS scale: 2-3/10 Pain location: Right mid-back.   Pain description: As above.   Aggravating factors: As above. Relieving factors: As above.    PRECAUTIONS: None  RED FLAGS: None   WEIGHT BEARING RESTRICTIONS: No  FALLS:  Has patient fallen in last 6 months? No  LIVING ENVIRONMENT: Lives with: lives alone Lives in: House/apartment Has following equipment at home: None  OCCUPATION: PT for [employer].    PLOF: Independent  PATIENT GOALS: Not have pain.     OBJECTIVE:   DIAGNOSTIC FINDINGS:  IMPRESSION: No acute bony abnormality.  PATIENT SURVEYS:  NDI: 21/50.   POSTURE: rounded shoulders and forward head  PALPATION: Tender to palpation at T5 to T5 and right of this region.  Prior cyst removal area observed (remaining scar tissue?).     UE EXTREMITY ROM:    WNL.  UE STRENGTH:   Right Triceps 4/5 per contralateral comparison.  Right grip 65# and left is 85#.   TREATMENT DATE:  11/23/24:                                       EXERCISE LOG  Exercise Repetitions and Resistance Comments  UBE 10 minutes   Pulleys with small bolster vertically on spine 5 minutes    Doorway 30 sec holds x 4    Apollo machine 20#, 3 x 10    Lat PD 20#, 3 x 10       Patient in prone on treatment table and received STW/M and gentle mid-thoracic and costo-vertebral mobs x 15 minutes  f/b HMP and IFC at 80-150 Hz on 40% scan  x 20 minutes in supine.     11/17/24                                  EXERCISE LOG  Exercise Repetitions and Resistance Comments  UBE 10 mins (forward/backward)   Scap Retractions 20 reps    Shoulder Circles    Rows Green x 20 reps   Extension Green  x 20 reps   ER Red x 20 reps    T's  and  Y's Greenx 20 reps        Blank cell = exercise not performed today   Manual Therapy Soft Tissue Mobilization: Right Thoracic Spine, STW/M to right thoracic paraspinals and other musculature to decrease pain and tone in LT SL    Modalities  Date:  Unattended Estim: Thoracic spine, IFC 80-150 Hz, 15 mins, Pain and Tone Hot Pack: Thoracic Spine, 15 mins, Pain and Tone    11/10/24:  UBE x 8 minutes f/b patient in prone on treatment table and received STW/M and gentle mid-thoracic and costo-vertebral mobs x 15 minutes f/b HMP and IFC at 80-150 Hz on 40% scan x 20 minutes in supine.                                                                                                                                  11/09/24:  UBE x 6 minutes at 120 RPM's f/b Scapular retraction with blue XTS high and mid level f/b patient in prone on treatment table and received STW/M and gentle mid-thoracic and costo-vertebral mobs x 15 minutes f/b HMP and IFC at 80-150 Hz on 40% scan x 20 minutes in supine.     PATIENT EDUCATION:  Education details: See below. Person educated: Patient Education method: Demo, handout. Education comprehension: Very good.  HOME EXERCISE PROGRAM: HOME EXERCISE PROGRAM [[id number]]  Scapular Retraction -  Repeat 15 Repetitions, Hold 2 Seconds, Complete 2 Sets, Perform 2 Times a Day.    ASSESSMENT:  CLINICAL IMPRESSION: Patient did very well with the addition of circuit weight exercises (low resistance).  He performed with excellent technique and no complaints.    OBJECTIVE IMPAIRMENTS: decreased activity tolerance, postural dysfunction, and pain.   ACTIVITY LIMITATIONS: carrying,  lifting, bending, and standing  PARTICIPATION LIMITATIONS: meal prep, cleaning, laundry, and yard work  PERSONAL FACTORS: Time since onset of injury/illness/exacerbation are also affecting patient's functional outcome.   REHAB POTENTIAL: Good  CLINICAL DECISION MAKING: Stable/uncomplicated  EVALUATION COMPLEXITY: Low   GOALS:  LONG TERM GOALS: Target date: 12/13/24.  Ind with a HEP.  Goal status: INITIAL  2.  Stand 20 minutes with pain not > 3/10.  Goal status: INITIAL  3.  Perform ADL's with pain not > 3-4/10.  Goal status: INITIAL  4.  Improve NDI score by at least  5 points.  Goal status: INITIAL   PLAN:  PT FREQUENCY/DURATION:  10 visits.  PLANNED INTERVENTIONS: 97110-Therapeutic exercises, 97530- Therapeutic activity, [id number]- Neuromuscular re-education, 97535- Self Care, 02859- Manual therapy, [id number]- Electrical stimulation (unattended), [phone]- Ultrasound, Patient/Family education, Joint mobilization, Spinal mobilization, and Moist heat.  PLAN FOR NEXT SESSION: Postural exercises, Gentle mid-thoracic and costo-vertebral mobs.  STW/M and modalities as needed.     Liesl Simons, PT 11/23/2024, 3:42 PM  "

## 2024-11-26 ENCOUNTER — Ambulatory Visit: Admitting: *Deleted

## 2024-11-26 DIAGNOSIS — M546 Pain in thoracic spine: Secondary | ICD-10-CM

## 2024-11-26 DIAGNOSIS — R293 Abnormal posture: Secondary | ICD-10-CM

## 2024-11-26 NOTE — Therapy (Signed)
 " OUTPATIENT PHYSICAL THERAPY THORACOLUMBAR TREATMENT  Patient Name: Edward Fisher MRN: [id number] DOB:03-27-1966, 59 y.o., male Today's Date: 11/26/2024  END OF SESSION:  PT End of Session - 11/26/24 0901     Visit Number 7    Number of Visits 10    Date for Recertification  12/13/24    PT Start Time 0845    PT Stop Time 0943    PT Time Calculation (min) 58 min           Past Medical History:  Diagnosis Date   Arteriosclerotic cardiovascular disease (ASCVD)    Onset in 04/2012; CABG in 06/2012 w/ LIMA-LAD, L Rad-CFX, SVG-PL-PDA   Hyperlipidemia    goal LDL < 70   Insomnia    Nephrolithiasis    Parkinson's disease (HCC)    Past Surgical History:  Procedure Laterality Date   COLONOSCOPY N/A 09/16/2024   Procedure: COLONOSCOPY;  Surgeon: Shaaron Lamar HERO, MD;  [facility];  Service: Endoscopy;  Laterality: N/A;  11:30 AM, OK RM 1-2   CORONARY ARTERY BYPASS GRAFT  06/24/2012   Procedure: CORONARY ARTERY BYPASS GRAFTING (CABG);  Surgeon: Dallas KATHEE Jude, MD;  Location: [facility] OR;  Service: Open Heart Surgery;  Laterality: N/A;  Coronary Artery  Bypass X 4 utilizing endoscopic saphenous vein graft and  Left radial Artery Harvest    CORONARY PRESSURE/FFR STUDY N/A 11/14/2023   Procedure: CORONARY PRESSURE/FFR STUDY;  Surgeon: Wendel Lurena POUR, MD;  [facility];  Service: Cardiovascular;  Laterality: N/A;   CYSTOSCOPY W/ RETROGRADES Left 08/23/2015   Procedure: CYSTOSCOPY WITH RETROGRADE PYELOGRAM;  Surgeon: Belvie LITTIE Clara, MD;  Location: [facility];  Service: Urology;  Laterality: Left;   CYSTOSCOPY WITH URETEROSCOPY, STONE BASKETRY AND STENT PLACEMENT Left 08/23/2015   Procedure: CYSTOSCOPY WITH URETEROSCOPY, STONE BASKET EXTRACTION;  Surgeon: Belvie LITTIE Clara, MD;  Location: [facility];  Service: Urology;  Laterality: Left;   HOLMIUM LASER APPLICATION Left 08/23/2015   Procedure: HOLMIUM LASER APPLICATION;  Surgeon: Belvie LITTIE Clara, MD;  Location: [facility];   Service: Urology;  Laterality: Left;   LEFT HEART CATH AND CORS/GRAFTS ANGIOGRAPHY N/A 11/14/2023   Procedure: LEFT HEART CATH AND CORS/GRAFTS ANGIOGRAPHY;  Surgeon: Wendel Lurena POUR, MD;  [facility]: [facility];  Service: Cardiovascular;  Laterality: N/A;   LEFT HEART CATHETERIZATION WITH CORONARY ANGIOGRAM N/A 06/23/2012   Procedure: LEFT HEART CATHETERIZATION WITH CORONARY ANGIOGRAM;  Surgeon: Ozell Fell, MD;  Location: [facility] CATH LAB;  Service: Cardiovascular;  Laterality: N/A;   RHINOPLASTY     TONSILLECTOMY  ~1971   Patient Active Problem List   Diagnosis Date Noted   Parkinson's disease (HCC) 09/22/2024   Insomnia due to medical condition 09/22/2024   Overweight (BMI 25.0-29.9) 09/22/2024   Vitamin D  deficiency 09/09/2024   Constipation 09/08/2024   Screen for colon cancer 09/08/2024   Dizziness on standing 06/23/2024   Lack of energy 06/23/2024   Unresolved grief 06/23/2024   Chronic thoracic back pain 06/10/2024   Encounter for general adult medical examination with abnormal findings 06/10/2024   Overweight with body mass index (BMI) of 28 to 28.9 in adult 06/10/2024   Nocturia 06/10/2024   Dysuria 06/10/2024   Other intervertebral disc degeneration, lumbar region 07/17/2023   S/P CABG (coronary artery bypass graft) 03/19/2023   Weakness 03/26/2022   Tremor of right hand 05/02/2020   Uncontrolled REM sleep behavior disorder 05/02/2020   Hemiparesis of right dominant side (HCC) 05/02/2020   Chest pain 06/22/2014  Essential hypertension 06/22/2014   Belching 06/22/2014   Local skin infection 03/16/2013   Arteriosclerotic cardiovascular disease (ASCVD) 06/26/2012   Hyperlipidemia     REFERRING PROVIDER: Prentice Compton   REFERRING DIAG: Chronic right-side thoracic back pain.    Rationale for Evaluation and Treatment: Rehabilitation  THERAPY DIAG:  Pain in thoracic spine  Abnormal posture  ONSET DATE: About 6 years ago or longer.  SUBJECTIVE:                                                                                                                                                                                            SUBJECTIVE STATEMENT: Pian low at rest but increase when I do things around the house  PERTINENT HISTORY:  Prior left shoulder injury, Parkinson.  Cyst removal (lower cervical/upper thoracic, right mid-thoracic).    PAIN:  Are you having pain? Yes: NPRS scale: 2-3/10 Pain location: Right mid-back.   Pain description: As above.   Aggravating factors: As above. Relieving factors: As above.    PRECAUTIONS: None  RED FLAGS: None   WEIGHT BEARING RESTRICTIONS: No  FALLS:  Has patient fallen in last 6 months? No  LIVING ENVIRONMENT: Lives with: lives alone Lives in: House/apartment Has following equipment at home: None  OCCUPATION: PT for [employer].    PLOF: Independent  PATIENT GOALS: Not have pain.     OBJECTIVE:   DIAGNOSTIC FINDINGS:  IMPRESSION: No acute bony abnormality.  PATIENT SURVEYS:  NDI: 21/50.   POSTURE: rounded shoulders and forward head  PALPATION: Tender to palpation at T5 to T5 and right of this region.  Prior cyst removal area observed (remaining scar tissue?).     UE EXTREMITY ROM:    WNL.  UE STRENGTH:   Right Triceps 4/5 per contralateral comparison.  Right grip 65# and left is 85#.   TREATMENT DATE:  11/23/24:                                       EXERCISE LOG  Exercise Repetitions and Resistance Comments  UBE 10 minutes   Pulleys with small bolster vertically on spine 5 minutes    Doorway    Apollo machine 20 #, 3 x 20    Lat PD 20#, 2x  20       Patient in sitting resting on pillows on treatment table while receiving STW/M x 15 minutes   HMP and IFC at 80-150 Hz on 40% scan x 15 minutes in supine.     11/17/24  EXERCISE LOG  Exercise Repetitions and Resistance Comments  UBE 10 mins (forward/backward)   Scap  Retractions 20 reps    Shoulder Circles    Rows Green x 20 reps   Extension Green  x 20 reps   ER Red x 20 reps    T's  and  Y's Greenx 20 reps        Blank cell = exercise not performed today   Manual Therapy Soft Tissue Mobilization: Right Thoracic Spine, STW/M to right thoracic paraspinals and other musculature to decrease pain and tone in LT SL    Modalities  Date:  Unattended Estim: Thoracic spine, IFC 80-150 Hz, 15 mins, Pain and Tone Hot Pack: Thoracic Spine, 15 mins, Pain and Tone    11/10/24:  UBE x 8 minutes f/b patient in prone on treatment table and received STW/M and gentle mid-thoracic and costo-vertebral mobs x 15 minutes f/b HMP and IFC at 80-150 Hz on 40% scan x 20 minutes in supine.                                                                                                                                  11/09/24:  UBE x 6 minutes at 120 RPM's f/b Scapular retraction with blue XTS high and mid level f/b patient in prone on treatment table and received STW/M and gentle mid-thoracic and costo-vertebral mobs x 15 minutes f/b HMP and IFC at 80-150 Hz on 40% scan x 20 minutes in supine.     PATIENT EDUCATION:  Education details: See below. Person educated: Patient Education method: Demo, handout. Education comprehension: Very good.  HOME EXERCISE PROGRAM: HOME EXERCISE PROGRAM [[id number]]  Scapular Retraction -  Repeat 15 Repetitions, Hold 2 Seconds, Complete 2 Sets, Perform 2 Times a Day.    ASSESSMENT:  CLINICAL IMPRESSION: Patient arrived today doing fairly well. He was able to continue with postural and back exs and did well without increased pain f/b STW and estim.   He performed with excellent technique and no complaints end of session.   OBJECTIVE IMPAIRMENTS: decreased activity tolerance, postural dysfunction, and pain.   ACTIVITY LIMITATIONS: carrying, lifting, bending, and standing  PARTICIPATION LIMITATIONS: meal prep, cleaning, laundry, and yard  work  PERSONAL FACTORS: Time since onset of injury/illness/exacerbation are also affecting patient's functional outcome.   REHAB POTENTIAL: Good  CLINICAL DECISION MAKING: Stable/uncomplicated  EVALUATION COMPLEXITY: Low   GOALS:  LONG TERM GOALS: Target date: 12/13/24.  Ind with a HEP.  Goal status: INITIAL  2.  Stand 20 minutes with pain not > 3/10.  Goal status: INITIAL  3.  Perform ADL's with pain not > 3-4/10.  Goal status: INITIAL  4.  Improve NDI score by at least 5 points.  Goal status: INITIAL   PLAN:  PT FREQUENCY/DURATION:  10 visits.  PLANNED INTERVENTIONS: 97110-Therapeutic exercises, 97530- Therapeutic activity, [id number]- Neuromuscular re-education, 97535- Self Care, 02859- Manual therapy, [id number]- Electrical stimulation (unattended), [phone]-  Ultrasound, Patient/Family education, Joint mobilization, Spinal mobilization, and Moist heat.  PLAN FOR NEXT SESSION: Postural exercises, Gentle mid-thoracic and costo-vertebral mobs.  STW/M and modalities as needed.     Jamail Cullers,CHRIS, PTA 11/26/2024, 12:31 PM  "

## 2024-11-29 ENCOUNTER — Encounter: Payer: Self-pay | Admitting: *Deleted

## 2024-12-03 ENCOUNTER — Encounter: Admitting: Physical Medicine & Rehabilitation

## 2024-12-09 ENCOUNTER — Encounter: Attending: Physical Medicine & Rehabilitation | Admitting: Physical Medicine & Rehabilitation

## 2024-12-09 ENCOUNTER — Encounter: Payer: Self-pay | Admitting: Physical Medicine & Rehabilitation

## 2024-12-09 VITALS — BP 111/75 | HR 81 | Ht 70.0 in | Wt 192.0 lb

## 2024-12-09 DIAGNOSIS — M546 Pain in thoracic spine: Secondary | ICD-10-CM

## 2024-12-09 DIAGNOSIS — G8929 Other chronic pain: Secondary | ICD-10-CM

## 2024-12-09 MED ORDER — PREGABALIN 50 MG PO CAPS: 50.0000 mg | ORAL_CAPSULE | Freq: Two times a day (BID) | ORAL | 1 refills | Status: AC

## 2024-12-09 NOTE — Progress Notes (Signed)
 "  Subjective:    Patient ID: Edward Fisher, male    DOB: [birth year], 59 y.o.   MRN: [id number]  HPI Discussed the use of AI scribe software for clinical note transcription with the patient, who gave verbal consent to proceed.  History of Present Illness Edward Fisher is a 59 year old male with Parkinson's disease and chronic thoracic back pain who presents for follow-up of persistent mid-thoracic back pain.  He reports chronic mid-thoracic back pain, primarily localized to the T3-T4 region, with occasional radiation or shifting in location. The pain is persistent and sometimes severe enough to require him to sit during activities. He notes a palpable lipoma in the same area. Heat and massage during physical therapy provide only temporary relief, and he has not experienced significant improvement in pain or function from ongoing physical therapy, which he attends twice weekly when possible.  He previously discontinued gabapentin due to mental fogginess, requiring a gradual taper. He has not yet initiated pregabalin  but is considering it, with awareness of potential sedation. He finds topical creams and patches difficult to apply due to the pain location. He does not currently use a heating pad at home but is open to trying this, as heat during therapy is temporarily beneficial.  He experiences chronic muscle tension and stiffness, which he attributes in part to Parkinson's disease. He frequently has involuntary muscle tensing and difficulty relaxing, with episodes of involuntary inward turning of his foot while sitting that he can sometimes correct, though the tension recurs within minutes. He also describes overall stiffness and difficulty with twisting movements. His daughter confirms the pain location and notes occasional radiation, though the pain remains generally localized.   Pain Inventory Average Pain 9 Pain Right Now 2 My pain is intermittent and dull  In the last 24 hours, has pain  interfered with the following? General activity 8 Relation with others 1 Enjoyment of life 7 What TIME of day is your pain at its worst? varies Sleep (in general) Fair  Pain is worse with: walking, standing, and some activites Pain improves with: rest Relief from Meds: 0  Family History  Problem Relation Age of Onset   Hypertension Mother    Ulcers Father        Peptic ulcer disease   Coronary artery disease Father        Stents   Coronary artery disease Brother        CABG   Colon cancer Neg Hx    Liver disease Neg Hx    Social History   Socioeconomic History   Marital status: Widowed    Spouse name: Not on file   Number of children: Not on file   Years of education: Not on file   Highest education level: Not on file  Occupational History   Occupation: Therapist, Sports: [employer]    Comment: self-employed  Tobacco Use   Smoking status: Never   Smokeless tobacco: Former    Types: Chew    Quit date: 1997   Tobacco comments:    06/22/2012 quit chewing 10-15 years ago  Vaping Use   Vaping status: Never Used  Substance and Sexual Activity   Alcohol use: No    Alcohol/week: 0.0 standard drinks of alcohol   Drug use: No   Sexual activity: Yes  Other Topics Concern   Not on file  Social History Narrative   Regular exercise: No   Social Drivers of Health  Tobacco Use: Medium Risk (11/18/2024)   Patient History    Smoking Tobacco Use: Never    Smokeless Tobacco Use: Former    Passive Exposure: Not on Actuary Strain: Low Risk (12/09/2023)   Received from [facility]   Overall Financial Resource Strain (CARDIA)    Difficulty of Paying Living Expenses: Not very hard  Food Insecurity: No Food Insecurity (12/09/2023)   [facility]   Epic    Within the past 12 months, you worried that your food would run out before you got the money to buy more.: Never true    Within the past 12 months, the food you bought  just didn't last and you didn't have money to get more.: Never true  Transportation Needs: No Transportation Needs (12/09/2023)   Received from [facility] - Transportation    Lack of Transportation (Medical): No    Lack of Transportation (Non-Medical): No  Physical Activity: Not on file  Stress: Not on file  Social Connections: Not on file  Depression (PHQ2-9): Low Risk (10/22/2024)   Depression (PHQ2-9)    PHQ-2 Score: 0  Recent Concern: Depression (PHQ2-9) - Medium Risk (09/07/2024)   Depression (PHQ2-9)    PHQ-2 Score: 7  Alcohol Screen: Not on file  Housing: Low Risk (12/09/2023)   Received from [facility]    In the last 12 months, was there a time when you were not able to pay the mortgage or rent on time?: No    In the past 12 months, how many times have you moved where you were living?: 0    At any time in the past 12 months, were you homeless or living in a shelter (including now)?: No  Utilities: Not At Risk (12/09/2023)   Received from [facility] Utilities    Threatened with loss of utilities: No  Health Literacy: Not on file   Past Surgical History:  Procedure Laterality Date   COLONOSCOPY N/A 09/16/2024   Procedure: COLONOSCOPY;  Surgeon: Shaaron Lamar HERO, MD;  [facility];  Service: Endoscopy;  Laterality: N/A;  11:30 AM, OK RM 1-2   CORONARY ARTERY BYPASS GRAFT  06/24/2012   Procedure: CORONARY ARTERY BYPASS GRAFTING (CABG);  Surgeon: Dallas KATHEE Jude, MD;  Location: [facility] OR;  Service: Open Heart Surgery;  Laterality: N/A;  Coronary Artery  Bypass X 4 utilizing endoscopic saphenous vein graft and  Left radial Artery Harvest    CORONARY PRESSURE/FFR STUDY N/A 11/14/2023   Procedure: CORONARY PRESSURE/FFR STUDY;  Surgeon: Wendel Lurena POUR, MD;  [facility] INV[facility] CV LAB;  Service: Cardiovascular;  Laterality: N/A;   CYSTOSCOPY W/ RETROGRADES Left 08/23/2015   Procedure: CYSTOSCOPY WITH RETROGRADE PYELOGRAM;  Surgeon: Belvie LITTIE Clara, MD;  Location: [facility] ORS;  Service: Urology;  Laterality: Left;   CYSTOSCOPY WITH URETEROSCOPY, STONE BASKETRY AND STENT PLACEMENT Left 08/23/2015   Procedure: CYSTOSCOPY WITH URETEROSCOPY, STONE BASKET EXTRACTION;  Surgeon: Belvie LITTIE Clara, MD;  Location: [facility] ORS;  Service: Urology;  Laterality: Left;   HOLMIUM LASER APPLICATION Left 08/23/2015   Procedure: HOLMIUM LASER APPLICATION;  Surgeon: Belvie LITTIE Clara, MD;  Location: [facility] ORS;  Service: Urology;  Laterality: Left;   LEFT HEART CATH AND CORS/GRAFTS ANGIOGRAPHY N/A 11/14/2023   Procedure: LEFT HEART CATH AND CORS/GRAFTS ANGIOGRAPHY;  Surgeon: Wendel Lurena POUR, MD;  [facility];  Service: Cardiovascular;  Laterality: N/A;   LEFT HEART CATHETERIZATION WITH CORONARY  ANGIOGRAM N/A 06/23/2012   Procedure: LEFT HEART CATHETERIZATION WITH CORONARY ANGIOGRAM;  Surgeon: Ozell Fell, MD;  Location: [facility] CATH LAB;  Service: Cardiovascular;  Laterality: N/A;   RHINOPLASTY     TONSILLECTOMY  ~1971   Past Surgical History:  Procedure Laterality Date   COLONOSCOPY N/A 09/16/2024   Procedure: COLONOSCOPY;  Surgeon: Shaaron Lamar HERO, MD;  [facility];  Service: Endoscopy;  Laterality: N/A;  11:30 AM, OK RM 1-2   CORONARY ARTERY BYPASS GRAFT  06/24/2012   Procedure: CORONARY ARTERY BYPASS GRAFTING (CABG);  Surgeon: Dallas KATHEE Jude, MD;  Location[facility] OR;  Service: Open Heart Surgery;  Laterality: N/A;  Coronary Artery  Bypass X 4 utilizing endoscopic saphenous vein graft and  Left radial Artery Harvest    CORONARY PRESSURE/FFR STUDY N/A 11/14/2023   Procedure: CORONARY PRESSURE/FFR STUDY;  Surgeon: Wendel Lurena POUR, MD;  [facility];  Service: Cardiovascular;  Laterality: N/A;   CYSTOSCOPY W/ RETROGRADES Left 08/23/2015   Procedure: CYSTOSCOPY WITH RETROGRADE PYELOGRAM;  Surgeon: Belvie LITTIE Clara, MD;  Location: [facility];  Service: Urology;  Laterality: Left;   CYSTOSCOPY WITH URETEROSCOPY, STONE  BASKETRY AND STENT PLACEMENT Left 08/23/2015   Procedure: CYSTOSCOPY WITH URETEROSCOPY, STONE BASKET EXTRACTION;  Surgeon: Belvie LITTIE Clara, MD;  Location: [facility];  Service: Urology;  Laterality: Left;   HOLMIUM LASER APPLICATION Left 08/23/2015   Procedure: HOLMIUM LASER APPLICATION;  Surgeon: Belvie LITTIE Clara, MD;  Location: [facility];  Service: Urology;  Laterality: Left;   LEFT HEART CATH AND CORS/GRAFTS ANGIOGRAPHY N/A 11/14/2023   Procedure: LEFT HEART CATH AND CORS/GRAFTS ANGIOGRAPHY;  Surgeon: Wendel Lurena POUR, MD;  [facility];  Service: Cardiovascular;  Laterality: N/A;   LEFT HEART CATHETERIZATION WITH CORONARY ANGIOGRAM N/A 06/23/2012   Procedure: LEFT HEART CATHETERIZATION WITH CORONARY ANGIOGRAM;  Surgeon: Ozell Fell, MD;  Location: [facility] CATH LAB;  Service: Cardiovascular;  Laterality: N/A;   RHINOPLASTY     TONSILLECTOMY  ~1971   Past Medical History:  Diagnosis Date   Arteriosclerotic cardiovascular disease (ASCVD)    Onset in 04/2012; CABG in 06/2012 w/ LIMA-LAD, L Rad-CFX, SVG-PL-PDA   Hyperlipidemia    goal LDL < 70   Insomnia    Nephrolithiasis    Parkinson's disease (HCC)    BP 111/75   Pulse 81   Ht 5' 10 (1.778 m)   Wt 192 lb (87.1 kg)   SpO2 99%   BMI 27.55 kg/m   Opioid Risk Score:   Fall Risk Score:  `1  Depression screen PHQ 2/9     10/22/2024    1:20 PM 09/22/2024   12:00 PM 09/07/2024    2:29 PM 06/10/2024    9:04 AM 07/23/2012   12:59 PM  Depression screen PHQ 2/9  Decreased Interest 0  1 1 0  Down, Depressed, Hopeless 0  0 1 0  PHQ - 2 Score 0  1 2 0   Altered sleeping  0 1 0   Tired, decreased energy  0 3 0   Change in appetite  1 0 0   Feeling bad or failure about yourself   0 0 0   Trouble concentrating  2 1 0   Moving slowly or fidgety/restless  1 1 0   Suicidal thoughts  0 0 0   PHQ-9 Score   7  2    Difficult doing work/chores  Not difficult at all Somewhat difficult Not difficult at all  Data saved with a  previous flowsheet row definition      Review of Systems     Objective:   Physical Exam General No acute distress Mood affect appropriate Ambulates without assistive device no evidence of toe drag or knee stability He has a lipoma right paraspinal area medial to the midpoint of the scapula.  There is some tenderness over this area. Posture mild thoracic kyphosis. Lumbar showing no evidence of lordosis or scoliosis Motor strength is 5/5 bilateral deltoid bicep tricep grip as well as hip flexors knee extensors ankle dorsiflexors No evidence of pill-rolling tremor or intention tremor.  Tone appears normal bilateral upper extremities.       Assessment & Plan:   Assessment and Plan Assessment & Plan Chronic thoracic back pain Chronic thoracic back pain at T3-T4 with limited response to physical therapy. Likely multifactorial with chronic myofascial pain and possible Parkinson's disease exacerbation. Gabapentin caused cognitive side effects. Topical agents impractical. Pregabalin  considered for chronic muscle pain with caution for sedation with nortriptyline. Surgical excision of lipoma discussed as an option. - Prescribed low-dose pregabalin  twice daily with gradual titration based on response and tolerability. - Reviewed pregabalin  and nortriptyline interaction; advised caution for sedation but safe at low doses. - Recommended heating pad for symptomatic relief. - Discussed surgical excision of lipoma; offered referral to general surgery if elected. - Advised follow-up in six weeks to assess pregabalin  response and adjust therapy. - Provided medication information and instructions to him and his family.   "

## 2024-12-09 NOTE — Patient Instructions (Addendum)
" °  ° ° °  VISIT SUMMARY: Today, we discussed your persistent mid-thoracic back pain and explored treatment options. We reviewed your current symptoms, previous treatments, and potential new medications.  YOUR PLAN: CHRONIC THORACIC BACK PAIN: You have chronic mid-thoracic back pain at the T3-T4 region, which has not improved significantly with physical therapy. The pain may be related to chronic myofascial pain and possibly exacerbated by Parkinson's disease. -Start taking low-dose pregabalin  twice daily. We will gradually increase the dose based on how you respond and tolerate it. -Be cautious of potential sedation from pregabalin , especially since you are also taking nortriptyline. It is safe at low doses. -Use a heating pad at home for symptomatic relief. -Consider surgical excision of the lipoma if you choose. We can refer you to general surgery for this procedure. -Follow up in six weeks to assess your response to pregabalin  and adjust your therapy as needed. -We provided you and your family with medication information and instructions.    Contains text generated by Abridge.    Contains text generated by Abridge.   "

## 2024-12-10 ENCOUNTER — Ambulatory Visit: Attending: Physical Medicine & Rehabilitation

## 2024-12-10 DIAGNOSIS — M546 Pain in thoracic spine: Secondary | ICD-10-CM

## 2024-12-10 DIAGNOSIS — R293 Abnormal posture: Secondary | ICD-10-CM

## 2024-12-10 NOTE — Therapy (Signed)
 " OUTPATIENT PHYSICAL THERAPY THORACOLUMBAR TREATMENT  Patient Name: Edward Fisher MRN: [id number] DOB:[birth year], 59 y.o., male Today's Date: 12/10/2024  END OF SESSION:  PT End of Session - 12/10/24 0935     Visit Number 8    Number of Visits 10    Date for Recertification  12/13/24    PT Start Time 0930           Past Medical History:  Diagnosis Date   Arteriosclerotic cardiovascular disease (ASCVD)    Onset in 04/2012; CABG in 06/2012 w/ LIMA-LAD, L Rad-CFX, SVG-PL-PDA   Hyperlipidemia    goal LDL < 70   Insomnia    Nephrolithiasis    Parkinson's disease (HCC)    Past Surgical History:  Procedure Laterality Date   COLONOSCOPY N/A 09/16/2024   Procedure: COLONOSCOPY;  Surgeon: Shaaron Lamar HERO, MD;  [facility];  Service: Endoscopy;  Laterality: N/A;  11:30 AM, OK RM 1-2   CORONARY ARTERY BYPASS GRAFT  06/24/2012   Procedure: CORONARY ARTERY BYPASS GRAFTING (CABG);  Surgeon: Dallas KATHEE Jude, MD;  Location: [facility] OR;  Service: Open Heart Surgery;  Laterality: N/A;  Coronary Artery  Bypass X 4 utilizing endoscopic saphenous vein graft and  Left radial Artery Harvest    CORONARY PRESSURE/FFR STUDY N/A 11/14/2023   Procedure: CORONARY PRESSURE/FFR STUDY;  Surgeon: Wendel Lurena POUR, MD;  [facility];  Service: Cardiovascular;  Laterality: N/A;   CYSTOSCOPY W/ RETROGRADES Left 08/23/2015   Procedure: CYSTOSCOPY WITH RETROGRADE PYELOGRAM;  Surgeon: Belvie LITTIE Clara, MD;  Location: [facility];  Service: Urology;  Laterality: Left;   CYSTOSCOPY WITH URETEROSCOPY, STONE BASKETRY AND STENT PLACEMENT Left 08/23/2015   Procedure: CYSTOSCOPY WITH URETEROSCOPY, STONE BASKET EXTRACTION;  Surgeon: Belvie LITTIE Clara, MD;  Location: [facility];  Service: Urology;  Laterality: Left;   HOLMIUM LASER APPLICATION Left 08/23/2015   Procedure: HOLMIUM LASER APPLICATION;  Surgeon: Belvie LITTIE Clara, MD;  Location: [facility];  Service: Urology;  Laterality: Left;   LEFT HEART CATH  AND CORS/GRAFTS ANGIOGRAPHY N/A 11/14/2023   Procedure: LEFT HEART CATH AND CORS/GRAFTS ANGIOGRAPHY;  Surgeon: Wendel Lurena POUR, MD;  [facility];  Service: Cardiovascular;  Laterality: N/A;   LEFT HEART CATHETERIZATION WITH CORONARY ANGIOGRAM N/A 06/23/2012   Procedure: LEFT HEART CATHETERIZATION WITH CORONARY ANGIOGRAM;  Surgeon: Ozell Fell, MD;  Location: [facility] CATH LAB;  Service: Cardiovascular;  Laterality: N/A;   RHINOPLASTY     TONSILLECTOMY  ~1971   Patient Active Problem List   Diagnosis Date Noted   Parkinson's disease (HCC) 09/22/2024   Insomnia due to medical condition 09/22/2024   Overweight (BMI 25.0-29.9) 09/22/2024   Vitamin D  deficiency 09/09/2024   Constipation 09/08/2024   Screen for colon cancer 09/08/2024   Dizziness on standing 06/23/2024   Lack of energy 06/23/2024   Unresolved grief 06/23/2024   Chronic thoracic back pain 06/10/2024   Encounter for general adult medical examination with abnormal findings 06/10/2024   Overweight with body mass index (BMI) of 28 to 28.9 in adult 06/10/2024   Nocturia 06/10/2024   Dysuria 06/10/2024   Other intervertebral disc degeneration, lumbar region 07/17/2023   S/P CABG (coronary artery bypass graft) 03/19/2023   Weakness 03/26/2022   Tremor of right hand 05/02/2020   Uncontrolled REM sleep behavior disorder 05/02/2020   Hemiparesis of right dominant side (HCC) 05/02/2020   Chest pain 06/22/2014   Essential hypertension 06/22/2014   Belching 06/22/2014   Local skin infection 03/16/2013  Arteriosclerotic cardiovascular disease (ASCVD) 06/26/2012   Hyperlipidemia     REFERRING PROVIDER: Prentice Compton   REFERRING DIAG: Chronic right-side thoracic back pain.    Rationale for Evaluation and Treatment: Rehabilitation  THERAPY DIAG:  Pain in thoracic spine  Abnormal posture  ONSET DATE: About 6 years ago or longer.  SUBJECTIVE:                                                                                                                                                                                            SUBJECTIVE STATEMENT: Pt denies any pain today.   PERTINENT HISTORY:  Prior left shoulder injury, Parkinson.  Cyst removal (lower cervical/upper thoracic, right mid-thoracic).    PAIN:  Are you having pain? No  PRECAUTIONS: None  RED FLAGS: None   WEIGHT BEARING RESTRICTIONS: No  FALLS:  Has patient fallen in last 6 months? No  LIVING ENVIRONMENT: Lives with: lives alone Lives in: House/apartment Has following equipment at home: None  OCCUPATION: PT for [employer].    PLOF: Independent  PATIENT GOALS: Not have pain.     OBJECTIVE:   DIAGNOSTIC FINDINGS:  IMPRESSION: No acute bony abnormality.  PATIENT SURVEYS:  NDI: 21/50.   POSTURE: rounded shoulders and forward head  PALPATION: Tender to palpation at T5 to T5 and right of this region.  Prior cyst removal area observed (remaining scar tissue?).     UE EXTREMITY ROM:    WNL.  UE STRENGTH:   Right Triceps 4/5 per contralateral comparison.  Right grip 65# and left is 85#.   TREATMENT DATE:  12/10/24:                       EXERCISE LOG  Exercise Repetitions and Resistance Comments  UBE 10 minutes; 120 RPM (forward/backward)   Pulleys with small bolster vertically on spine 5 minutes    Doorway    Apollo machine; Rows 20#, 3 x 20 reps   Lat PD 20#, 3 x 20 reps       Patient in sitting resting on pillows on treatment table while receiving STW/M  HMP and IFC at 80-150 Hz on 40% scan x 15 minutes in supine.     11/17/24                                  EXERCISE LOG  Exercise Repetitions and Resistance Comments  UBE 10 mins (forward/backward)   Scap Retractions 20 reps    Shoulder Circles    Rows Green x 20 reps  Extension Green  x 20 reps   ER Red x 20 reps    T's  and  Y's Greenx 20 reps        Blank cell = exercise not performed today   Manual Therapy Soft Tissue  Mobilization: Right Thoracic Spine, STW/M to right thoracic paraspinals and other musculature to decrease pain and tone in LT SL    Modalities  Date:  Unattended Estim: Thoracic spine, IFC 80-150 Hz, 15 mins, Pain and Tone Hot Pack: Thoracic Spine, 15 mins, Pain and Tone   PATIENT EDUCATION:  Education details: See below. Person educated: Patient Education method: Demo, handout. Education comprehension: Very good.  HOME EXERCISE PROGRAM: HOME EXERCISE PROGRAM [[id number]]  Scapular Retraction -  Repeat 15 Repetitions, Hold 2 Seconds, Complete 2 Sets, Perform 2 Times a Day.    ASSESSMENT:  CLINICAL IMPRESSION: Pt arrives for today's treatment session denying any pain.  Pt able to tolerate increased reps with arm machines today with slight fatigue noted.  STW/M performed to thoracic paraspinals to decrease pain and tone.  Normal responses to estim and MH noted upon removal.  Pt denied any pain at completion of today's treatment session.   OBJECTIVE IMPAIRMENTS: decreased activity tolerance, postural dysfunction, and pain.   ACTIVITY LIMITATIONS: carrying, lifting, bending, and standing  PARTICIPATION LIMITATIONS: meal prep, cleaning, laundry, and yard work  PERSONAL FACTORS: Time since onset of injury/illness/exacerbation are also affecting patient's functional outcome.   REHAB POTENTIAL: Good  CLINICAL DECISION MAKING: Stable/uncomplicated  EVALUATION COMPLEXITY: Low   GOALS:  LONG TERM GOALS: Target date: 12/13/24.  Ind with a HEP.  Goal status: INITIAL  2.  Stand 20 minutes with pain not > 3/10.  Goal status: INITIAL  3.  Perform ADL's with pain not > 3-4/10.  Goal status: INITIAL  4.  Improve NDI score by at least 5 points.  Goal status: INITIAL   PLAN:  PT FREQUENCY/DURATION:  10 visits.  PLANNED INTERVENTIONS: 97110-Therapeutic exercises, 97530- Therapeutic activity, [id number]- Neuromuscular re-education, 97535- Self Care, [id number]- Manual therapy, [id number]-  Electrical stimulation (unattended), [phone]- Ultrasound, Patient/Family education, Joint mobilization, Spinal mobilization, and Moist heat.  PLAN FOR NEXT SESSION: Postural exercises, Gentle mid-thoracic and costo-vertebral mobs.  STW/M and modalities as needed.     Delon DELENA Gosling, PTA 12/10/2024, 10:35 AM  "

## 2024-12-13 ENCOUNTER — Ambulatory Visit: Admitting: Physical Therapy

## 2025-01-20 ENCOUNTER — Encounter: Admitting: Registered Nurse

## 2025-03-22 ENCOUNTER — Ambulatory Visit: Admitting: Nurse Practitioner

## 2025-03-22 ENCOUNTER — Ambulatory Visit: Admitting: Family Medicine
